# Patient Record
Sex: Female | Born: 1965 | Race: White | Hispanic: No | Marital: Married | Smoking: Former smoker
Health system: Southern US, Community
[De-identification: ages and names within clinical notes are randomized; demographics above are authoritative.]

## PROBLEM LIST (undated history)

## (undated) DIAGNOSIS — I10 Essential (primary) hypertension: Secondary | ICD-10-CM

## (undated) DIAGNOSIS — Z923 Personal history of irradiation: Secondary | ICD-10-CM

## (undated) DIAGNOSIS — Z9221 Personal history of antineoplastic chemotherapy: Secondary | ICD-10-CM

## (undated) DIAGNOSIS — M349 Systemic sclerosis, unspecified: Secondary | ICD-10-CM

## (undated) DIAGNOSIS — C069 Malignant neoplasm of mouth, unspecified: Secondary | ICD-10-CM

## (undated) HISTORY — PX: ABDOMINAL HYSTERECTOMY: SHX81

---

## 2006-03-28 ENCOUNTER — Emergency Department: Payer: Self-pay | Admitting: Emergency Medicine

## 2006-12-11 ENCOUNTER — Ambulatory Visit: Payer: Self-pay | Admitting: Unknown Physician Specialty

## 2008-02-22 ENCOUNTER — Ambulatory Visit: Payer: Self-pay | Admitting: Unknown Physician Specialty

## 2008-02-25 ENCOUNTER — Inpatient Hospital Stay: Payer: Self-pay | Admitting: Unknown Physician Specialty

## 2009-05-25 ENCOUNTER — Emergency Department: Payer: Self-pay | Admitting: Emergency Medicine

## 2009-06-01 ENCOUNTER — Ambulatory Visit: Payer: Self-pay | Admitting: Unknown Physician Specialty

## 2010-02-13 ENCOUNTER — Ambulatory Visit: Payer: Self-pay | Admitting: Unknown Physician Specialty

## 2010-06-05 ENCOUNTER — Ambulatory Visit: Payer: Self-pay | Admitting: Unknown Physician Specialty

## 2010-06-21 ENCOUNTER — Ambulatory Visit: Payer: Self-pay | Admitting: Unknown Physician Specialty

## 2011-06-10 ENCOUNTER — Ambulatory Visit: Payer: Self-pay | Admitting: Unknown Physician Specialty

## 2015-08-03 ENCOUNTER — Emergency Department
Admission: EM | Admit: 2015-08-03 | Discharge: 2015-08-03 | Disposition: A | Discharge status: Home / Self Care | Payer: BLUE CROSS/BLUE SHIELD | Attending: Emergency Medicine | Admitting: Emergency Medicine

## 2015-08-03 ENCOUNTER — Encounter: Payer: Self-pay | Admitting: Emergency Medicine

## 2015-08-03 ENCOUNTER — Emergency Department: Payer: BLUE CROSS/BLUE SHIELD

## 2015-08-03 DIAGNOSIS — Y9389 Activity, other specified: Secondary | ICD-10-CM | POA: Diagnosis not present

## 2015-08-03 DIAGNOSIS — S299XXA Unspecified injury of thorax, initial encounter: Secondary | ICD-10-CM | POA: Diagnosis present

## 2015-08-03 DIAGNOSIS — Y998 Other external cause status: Secondary | ICD-10-CM | POA: Insufficient documentation

## 2015-08-03 DIAGNOSIS — W19XXXA Unspecified fall, initial encounter: Secondary | ICD-10-CM

## 2015-08-03 DIAGNOSIS — Y9289 Other specified places as the place of occurrence of the external cause: Secondary | ICD-10-CM | POA: Insufficient documentation

## 2015-08-03 DIAGNOSIS — Z87891 Personal history of nicotine dependence: Secondary | ICD-10-CM | POA: Diagnosis not present

## 2015-08-03 DIAGNOSIS — S0083XA Contusion of other part of head, initial encounter: Secondary | ICD-10-CM | POA: Diagnosis not present

## 2015-08-03 DIAGNOSIS — W108XXA Fall (on) (from) other stairs and steps, initial encounter: Secondary | ICD-10-CM | POA: Insufficient documentation

## 2015-08-03 DIAGNOSIS — S80811A Abrasion, right lower leg, initial encounter: Secondary | ICD-10-CM | POA: Insufficient documentation

## 2015-08-03 DIAGNOSIS — S80812A Abrasion, left lower leg, initial encounter: Secondary | ICD-10-CM | POA: Insufficient documentation

## 2015-08-03 DIAGNOSIS — Z79899 Other long term (current) drug therapy: Secondary | ICD-10-CM | POA: Insufficient documentation

## 2015-08-03 DIAGNOSIS — R0789 Other chest pain: Secondary | ICD-10-CM

## 2015-08-03 DIAGNOSIS — S29001A Unspecified injury of muscle and tendon of front wall of thorax, initial encounter: Secondary | ICD-10-CM | POA: Insufficient documentation

## 2015-08-03 HISTORY — DX: Malignant neoplasm of mouth, unspecified: C06.9

## 2015-08-03 LAB — CBC WITH DIFFERENTIAL/PLATELET
BASOS ABS: 0.1 10*3/uL (ref 0–0.1)
BASOS PCT: 1 %
EOS ABS: 0.2 10*3/uL (ref 0–0.7)
Eosinophils Relative: 3 %
HEMATOCRIT: 35.3 % (ref 35.0–47.0)
HEMOGLOBIN: 11.4 g/dL — AB (ref 12.0–16.0)
Lymphocytes Relative: 25 %
Lymphs Abs: 1.6 10*3/uL (ref 1.0–3.6)
MCH: 31.5 pg (ref 26.0–34.0)
MCHC: 32.3 g/dL (ref 32.0–36.0)
MCV: 97.6 fL (ref 80.0–100.0)
MONOS PCT: 10 %
Monocytes Absolute: 0.6 10*3/uL (ref 0.2–0.9)
NEUTROS ABS: 3.9 10*3/uL (ref 1.4–6.5)
NEUTROS PCT: 61 %
Platelets: 178 10*3/uL (ref 150–440)
RBC: 3.61 MIL/uL — ABNORMAL LOW (ref 3.80–5.20)
RDW: 14.5 % (ref 11.5–14.5)
WBC: 6.4 10*3/uL (ref 3.6–11.0)

## 2015-08-03 LAB — COMPREHENSIVE METABOLIC PANEL
ALK PHOS: 85 U/L (ref 38–126)
ALT: 27 U/L (ref 14–54)
ANION GAP: 3 — AB (ref 5–15)
AST: 40 U/L (ref 15–41)
Albumin: 3.6 g/dL (ref 3.5–5.0)
BILIRUBIN TOTAL: 0.5 mg/dL (ref 0.3–1.2)
BUN: 12 mg/dL (ref 6–20)
CALCIUM: 8.8 mg/dL — AB (ref 8.9–10.3)
CO2: 25 mmol/L (ref 22–32)
CREATININE: 0.53 mg/dL (ref 0.44–1.00)
Chloride: 105 mmol/L (ref 101–111)
Glucose, Bld: 109 mg/dL — ABNORMAL HIGH (ref 65–99)
Potassium: 3.6 mmol/L (ref 3.5–5.1)
SODIUM: 133 mmol/L — AB (ref 135–145)
TOTAL PROTEIN: 7.1 g/dL (ref 6.5–8.1)

## 2015-08-03 LAB — TROPONIN I

## 2015-08-03 MED ORDER — IOHEXOL 350 MG/ML SOLN
100.0000 mL | Freq: Once | INTRAVENOUS | Status: AC | PRN
  Administered 2015-08-03: 100 mL via INTRAVENOUS

## 2015-08-03 MED ORDER — MORPHINE SULFATE (PF) 4 MG/ML IV SOLN
4.0000 mg | Freq: Once | INTRAVENOUS | Status: AC
  Administered 2015-08-03: 4 mg via INTRAVENOUS
  Filled 2015-08-03: qty 1

## 2015-08-03 MED ORDER — ONDANSETRON HCL 4 MG/2ML IJ SOLN
4.0000 mg | Freq: Once | INTRAMUSCULAR | Status: AC
  Administered 2015-08-03: 4 mg via INTRAVENOUS
  Filled 2015-08-03: qty 2

## 2015-08-03 NOTE — ED Provider Notes (Signed)
[facility] Emergency Department Provider Note  ____________________________________________  Time seen: Approximately 6:16 PM  I have reviewed the triage vital signs and the nursing notes.   HISTORY  Chief Complaint Fall    HPI Whitney Mclean is a 49 y.o. female patient reports she fell down the steps on Tuesday. Patient thinks she may have passed out but isn't sure she had a large bruise on her head. Patient reports she was okay but then 2 days ago began having a lot of pain with movement or deep breathing in the center of her chest over the breastbone. She came in to get that checked. The pain is sharp and stabbing in nature severe nothing else associated with it. Patient does not have a fever cough or chills.   Past Medical History  Diagnosis Date  . Oral cancer ([place])     There are no active problems to display for this patient.   Past Surgical History  Procedure Laterality Date  . Abdominal hysterectomy      Current Outpatient Rx  Name  Route  Sig  Dispense  Refill  . acetaminophen (TYLENOL) 500 MG tablet   Oral   Take 500 mg by mouth every 6 (six) hours as needed.         Marland Kitchen CALCIUM PO   Oral   Take 1 tablet by mouth daily.         . Multiple Vitamins-Minerals (MULTIVITAMIN WOMEN PO)   Oral   Take 1 tablet by mouth daily.           Allergies Oxycodone  No family history on file.  Social History Social History  Substance Use Topics  . Smoking status: Former Research scientist (life sciences)  . Smokeless tobacco: None  . Alcohol Use: Yes    Review of Systems Constitutional: No fever/chills Eyes: No visual changes. ENT: No sore throat. Cardiovascular:chest pain. Respiratory: Denies shortness of breath. Gastrointestinal: No abdominal pain.  No nausea, no vomiting.  No diarrhea.  No constipation. Genitourinary: Negative for dysuria. Musculoskeletal: Negative for back pain. Skin: Negative for rash. Neurological: Negative for headaches, focal  weakness or numbness.  10-point ROS otherwise negative.  ____________________________________________   PHYSICAL EXAM:  VITAL SIGNS: ED Triage Vitals  Enc Vitals Group     BP 08/03/15 1544 148/95 mmHg     Pulse Rate 08/03/15 1544 102     Resp 08/03/15 1544 18     Temp 08/03/15 1544 98.2 F (36.8 C)     Temp Source 08/03/15 1544 Oral     SpO2 08/03/15 1544 98 %     Weight 08/03/15 1544 100 lb (45.36 kg)     Height 08/03/15 1544 5\' 6"  (1.676 m)     Head Cir --      Peak Flow --      Pain Score 08/03/15 1545 9     Pain Loc --      Pain Edu? --      Excl. in [place]? --    Constitutional: Alert and oriented. Well appearing and in no acute distress. Eyes: Conjunctivae are normal. PERRL. EOMI. Head: Atraumatic. Nose: No congestion/rhinnorhea. Mouth/Throat: Mucous membranes are moist.  Oropharynx non-erythematous. Neck: No stridor No cervical spine tenderness to palpation. Cardiovascular: Normal rate, regular rhythm. Grossly normal heart sounds.  Good peripheral circulation. Respiratory: Normal respiratory effort.  No retractions. Lungs CTAB. Gastrointestinal: Soft and nontender. No distention. No abdominal bruits. No CVA tenderness. Musculoskeletal: No lower extremity tenderness nor edema.  No joint effusions.  Neurologic:  Normal speech and language. No gross focal neurologic deficits are appreciated. No gait instability. Skin:  Skin is warm, dry and intact. No rash noted. Patient does have a number of abrasions scratches on the legs anteriorly from the fall. Psychiatric: Mood and affect are normal. Speech and behavior are normal.  ____________________________________________   LABS (all labs ordered are listed, but only abnormal results are displayed)  Labs Reviewed  COMPREHENSIVE METABOLIC PANEL - Abnormal; Notable for the following:    Sodium 133 (*)    Glucose, Bld 109 (*)    Calcium 8.8 (*)    Anion gap 3 (*)    All other components within normal limits  CBC WITH  DIFFERENTIAL/PLATELET - Abnormal; Notable for the following:    RBC 3.61 (*)    Hemoglobin 11.4 (*)    All other components within normal limits  TROPONIN I   ____________________________________________  EKG  EKG read and interpreted by me shows normal sinus rhythm at a rate of 81 normal axis no ST-T wave changes essentially normal EKG ____________________________________________  RADIOLOGY  CT of the head and chest read by radiology as no acute disease I reviewed the CT of the chest and agree ____________________________________________   PROCEDURES   ____________________________________________   INITIAL IMPRESSION / ASSESSMENT AND PLAN / ED COURSE  Pertinent labs & imaging results that were available during my care of the patient were reviewed by me and considered in my medical decision making (see chart for details).   ____________________________________________   FINAL CLINICAL IMPRESSION(S) / ED DIAGNOSES  Final diagnoses:  None      Nena Polio, MD 08/03/15 1859

## 2015-08-03 NOTE — Discharge Instructions (Signed)
Chest Wall Pain Chest wall pain is pain in or around the bones and muscles of your chest. Sometimes, an injury causes this pain. Sometimes, the cause may not be known. This pain may take several weeks or longer to get better. HOME CARE INSTRUCTIONS  Pay attention to any changes in your symptoms. Take these actions to help with your pain:   Rest as told by your health care provider.   Avoid activities that cause pain. These include any activities that use your chest muscles or your abdominal and side muscles to lift heavy items.   If directed, apply ice to the painful area:  Put ice in a plastic bag.  Place a towel between your skin and the bag.  Leave the ice on for 20 minutes, 2-3 times per day.  Take over-the-counter and prescription medicines only as told by your health care provider.  Do not use tobacco products, including cigarettes, chewing tobacco, and e-cigarettes. If you need help quitting, ask your health care provider.  Keep all follow-up visits as told by your health care provider. This is important. SEEK MEDICAL CARE IF:  You have a fever.  Your chest pain becomes worse.  You have new symptoms. SEEK IMMEDIATE MEDICAL CARE IF:  You have nausea or vomiting.  You feel sweaty or light-headed.  You have a cough with phlegm (sputum) or you cough up blood.  You develop shortness of breath.   This information is not intended to replace advice given to you by your health care provider. Make sure you discuss any questions you have with your health care provider.   Document Released: 09/09/2005 Document Revised: 05/31/2015 Document Reviewed: 12/05/2014 Elsevier Interactive Patient Education 2016 [employer].  Use the Vicodin one pill up to 4 times a day as needed for pain. After that you can use Motrin up to 4 of the over-the-counter pills 3 times a day for 3 or 4 days. Please follow-up with your doctor if you continue to have pain after that. Please return to the  emergency room if anything gets worse he developed a fever shortness of breath or much worse pain.

## 2015-08-03 NOTE — ED Notes (Signed)
Pt to ED from [facility], states she fell down some steps on Saturday and hit her head, states she thinks she had some LOC but unsure, states the last couple of days she has become sore all over and then Tuesday started having some soreness across chest, states she is unable to move due to the pain across chest, states she his unsure is she hit her chest when she was falling, pt has bruising noted above right eye

## 2015-08-03 NOTE — ED Notes (Signed)
Patient transported to CT 

## 2016-07-01 ENCOUNTER — Encounter: Payer: Self-pay | Admitting: *Deleted

## 2016-07-01 ENCOUNTER — Emergency Department
Admission: EM | Admit: 2016-07-01 | Discharge: 2016-07-01 | Disposition: A | Discharge status: Home / Self Care | Payer: BLUE CROSS/BLUE SHIELD | Attending: Emergency Medicine | Admitting: Emergency Medicine

## 2016-07-01 ENCOUNTER — Emergency Department: Payer: BLUE CROSS/BLUE SHIELD

## 2016-07-01 DIAGNOSIS — R0781 Pleurodynia: Secondary | ICD-10-CM | POA: Insufficient documentation

## 2016-07-01 DIAGNOSIS — Y939 Activity, unspecified: Secondary | ICD-10-CM | POA: Insufficient documentation

## 2016-07-01 DIAGNOSIS — N3000 Acute cystitis without hematuria: Secondary | ICD-10-CM | POA: Diagnosis not present

## 2016-07-01 DIAGNOSIS — R112 Nausea with vomiting, unspecified: Secondary | ICD-10-CM

## 2016-07-01 DIAGNOSIS — Y92002 Bathroom of unspecified non-institutional (private) residence single-family (private) house as the place of occurrence of the external cause: Secondary | ICD-10-CM | POA: Diagnosis not present

## 2016-07-01 DIAGNOSIS — Z85819 Personal history of malignant neoplasm of unspecified site of lip, oral cavity, and pharynx: Secondary | ICD-10-CM | POA: Insufficient documentation

## 2016-07-01 DIAGNOSIS — Y999 Unspecified external cause status: Secondary | ICD-10-CM | POA: Insufficient documentation

## 2016-07-01 DIAGNOSIS — Z87891 Personal history of nicotine dependence: Secondary | ICD-10-CM | POA: Diagnosis not present

## 2016-07-01 DIAGNOSIS — W1809XA Striking against other object with subsequent fall, initial encounter: Secondary | ICD-10-CM | POA: Insufficient documentation

## 2016-07-01 DIAGNOSIS — Z5181 Encounter for therapeutic drug level monitoring: Secondary | ICD-10-CM | POA: Insufficient documentation

## 2016-07-01 DIAGNOSIS — S0990XA Unspecified injury of head, initial encounter: Secondary | ICD-10-CM | POA: Insufficient documentation

## 2016-07-01 LAB — URINALYSIS COMPLETE WITH MICROSCOPIC (ARMC ONLY)
Bacteria, UA: NONE SEEN
Bilirubin Urine: NEGATIVE
Glucose, UA: NEGATIVE mg/dL
Hgb urine dipstick: NEGATIVE
Nitrite: NEGATIVE
Protein, ur: 100 mg/dL — AB
Specific Gravity, Urine: 1.023 (ref 1.005–1.030)
pH: 5 (ref 5.0–8.0)

## 2016-07-01 LAB — CBC WITH DIFFERENTIAL/PLATELET
Basophils Absolute: 0.1 10*3/uL (ref 0–0.1)
Basophils Relative: 1 %
Eosinophils Absolute: 0 10*3/uL (ref 0–0.7)
Eosinophils Relative: 0 %
HCT: 35.6 % (ref 35.0–47.0)
Hemoglobin: 12.3 g/dL (ref 12.0–16.0)
Lymphocytes Relative: 20 %
Lymphs Abs: 1.5 10*3/uL (ref 1.0–3.6)
MCH: 35.5 pg — ABNORMAL HIGH (ref 26.0–34.0)
MCHC: 34.5 g/dL (ref 32.0–36.0)
MCV: 102.8 fL — ABNORMAL HIGH (ref 80.0–100.0)
Monocytes Absolute: 0.7 10*3/uL (ref 0.2–0.9)
Monocytes Relative: 9 %
Neutro Abs: 5.2 10*3/uL (ref 1.4–6.5)
Neutrophils Relative %: 70 %
Platelets: 157 10*3/uL (ref 150–440)
RBC: 3.47 MIL/uL — ABNORMAL LOW (ref 3.80–5.20)
RDW: 12.7 % (ref 11.5–14.5)
WBC: 7.5 10*3/uL (ref 3.6–11.0)

## 2016-07-01 LAB — PROTIME-INR
INR: 0.89
Prothrombin Time: 12 seconds (ref 11.4–15.2)

## 2016-07-01 LAB — COMPREHENSIVE METABOLIC PANEL
ALT: 24 U/L (ref 14–54)
AST: 36 U/L (ref 15–41)
Albumin: 4.8 g/dL (ref 3.5–5.0)
Alkaline Phosphatase: 50 U/L (ref 38–126)
Anion gap: 13 (ref 5–15)
BUN: 13 mg/dL (ref 6–20)
CO2: 24 mmol/L (ref 22–32)
Calcium: 9.6 mg/dL (ref 8.9–10.3)
Chloride: 98 mmol/L — ABNORMAL LOW (ref 101–111)
Creatinine, Ser: 0.57 mg/dL (ref 0.44–1.00)
GFR calc Af Amer: >60 mL/min (ref >60)
GFR calc non Af Amer: >60 mL/min (ref >60)
Glucose, Bld: 108 mg/dL — ABNORMAL HIGH (ref 65–99)
Potassium: 3.8 mmol/L (ref 3.5–5.1)
Sodium: 135 mmol/L (ref 135–145)
Total Bilirubin: 1.2 mg/dL (ref 0.3–1.2)
Total Protein: 8 g/dL (ref 6.5–8.1)

## 2016-07-01 MED ORDER — ONDANSETRON HCL 4 MG PO TABS: 4.0000 mg | ORAL_TABLET | Freq: Every day | ORAL | 1 refills | Status: AC | PRN

## 2016-07-01 MED ORDER — ONDANSETRON HCL 4 MG/2ML IJ SOLN
4.0000 mg | Freq: Once | INTRAMUSCULAR | Status: AC
  Administered 2016-07-01: 4 mg via INTRAVENOUS
  Filled 2016-07-01: qty 2

## 2016-07-01 MED ORDER — SULFAMETHOXAZOLE-TRIMETHOPRIM 800-160 MG PO TABS: 1.0000 | ORAL_TABLET | Freq: Two times a day (BID) | ORAL | 0 refills | Status: DC

## 2016-07-01 MED ORDER — SULFAMETHOXAZOLE-TRIMETHOPRIM 800-160 MG PO TABS
1.0000 | ORAL_TABLET | Freq: Once | ORAL | Status: AC
  Administered 2016-07-01: 1 via ORAL
  Filled 2016-07-01: qty 1

## 2016-07-01 MED ORDER — SODIUM CHLORIDE 0.9 % IV SOLN
Freq: Once | INTRAVENOUS | Status: AC
  Administered 2016-07-01: 21:00:00 via INTRAVENOUS

## 2016-07-01 NOTE — ED Triage Notes (Addendum)
Pt reports falling yesterday in the bathroom and hitting head on floor.  No loc.  Pt reports today she has a headache and vomited several times today.  Pt had been drinking etoh when fall occurred and pt reports she drinks etoh every day.    Pt alert   Speech clear.

## 2016-07-01 NOTE — ED Provider Notes (Signed)
[facility] Emergency Department Provider Note        Time seen: ----------------------------------------- 8:59 PM on 07/01/2016 -----------------------------------------    I have reviewed the triage vital signs and the nursing notes.   HISTORY  Chief Complaint Fall and Head Injury    HPI Whitney Mclean is a 50 y.o. female who presents to ER after she fell yesterday in the bathroom hitting her head on the floor. She is also complaining of left-sided rib pain. Today she has a headache she has vomited several times and has been unable to keep anything down by mouth currently. She reports she was drinking alcohol when the fall occurred and she drinks 2 large glasses of wine daily. She does not smoke anymore, denies fevers, chills, or shortness of breath.   Past Medical History:  Diagnosis Date  . Oral cancer ([place])     There are no active problems to display for this patient.   Past Surgical History:  Procedure Laterality Date  . ABDOMINAL HYSTERECTOMY      Allergies Oxycodone  Social History Social History  Substance Use Topics  . Smoking status: Former Research scientist (life sciences)  . Smokeless tobacco: Never Used  . Alcohol use Yes    Review of Systems Constitutional: Negative for fever. Cardiovascular: Negative for chest pain. Respiratory: Negative for shortness of breath. Gastrointestinal: Negative for abdominal pain, vomiting and diarrhea. Genitourinary: Negative for dysuria. Musculoskeletal: Positive for left-sided rib pain Skin: Negative for rash. Neurological: Positive for headache  10-point ROS otherwise negative.  ____________________________________________   PHYSICAL EXAM:  VITAL SIGNS: ED Triage Vitals  Enc Vitals Group     BP 07/01/16 1908 (!) 146/85     Pulse Rate 07/01/16 1908 93     Resp 07/01/16 1908 16     Temp --      Temp src --      SpO2 07/01/16 1908 100 %     Weight 07/01/16 1909 105 lb (47.6 kg)     Height 07/01/16  1909 5\' 6"  (1.676 m)     Head Circumference --      Peak Flow --      Pain Score 07/01/16 1917 8     Pain Loc --      Pain Edu? --      Excl. in [place]? --    Constitutional: Alert and oriented. Well appearing and in no distress. Eyes: Conjunctivae are normal. PERRL. Normal extraocular movements. ENT   Head: Normocephalic and atraumatic.   Nose: No congestion/rhinnorhea.   Mouth/Throat: Mucous membranes are moist.   Neck: No stridor.Previous right-sided anterior neck surgery Cardiovascular: Normal rate, regular Whitney. No murmurs, rubs, or gallops. Respiratory: Normal respiratory effort without tachypnea nor retractions. Breath sounds are clear and equal bilaterally. No wheezes/rales/rhonchi. Gastrointestinal: Soft and nontender. Normal bowel sounds Musculoskeletal: Nontender with normal range of motion in all extremities. No lower extremity tenderness nor edema. Left-sided inferior mid axillary rib tenderness Neurologic:  Normal speech and language. No gross focal neurologic deficits are appreciated.  Skin:  Skin is warm, dry and intact. No rash noted. Psychiatric: Mood and affect are normal. Speech and behavior are normal.  ____________________________________________  ED COURSE:  Pertinent labs & imaging results that were available during my care of the patient were reviewed by me and considered in my medical decision making (see chart for details). Clinical Course  Patient presents to ER after a fall, also with concerning alcohol intake. I will assess with basic labs, imaging if needed.  Procedures ____________________________________________   LABS (pertinent positives/negatives)  Labs Reviewed  CBC WITH DIFFERENTIAL/PLATELET - Abnormal; Notable for the following:       Result Value   RBC 3.47 (*)    MCV 102.8 (*)    MCH 35.5 (*)    All other components within normal limits  COMPREHENSIVE METABOLIC PANEL - Abnormal; Notable for the following:    Chloride 98 (*)     Glucose, Bld 108 (*)    All other components within normal limits  URINALYSIS COMPLETEWITH MICROSCOPIC ([facility] ONLY) - Abnormal; Notable for the following:    Color, Urine YELLOW (*)    APPearance CLEAR (*)    Ketones, ur 2+ (*)    Protein, ur 100 (*)    Leukocytes, UA 3+ (*)    Squamous Epithelial / LPF 0-5 (*)    All other components within normal limits  PROTIME-INR    RADIOLOGY Images were viewed by me  CT head, rib x-rays IMPRESSION: No acute disease.  No fracture.  IMPRESSION: 1.  No acute intracranial abnormality. 2. Mild cerebral atrophy for age.    ____________________________________________  FINAL ASSESSMENT AND PLAN  Fall, minor head injury, vomiting, Cystitis  Plan: Patient with labs and imaging as dictated above. Unclear if her symptoms are secondary to concussion or from viral etiology. Currently her symptoms are improving, she'll be discharged antiemetics and antibiotics for her UTI. She is stable for outpatient follow-up with her doctor. I have advised that her CBC is likely reflecting chronic alcoholism at this time and for her to discuss this with her primary care doctor.   Earleen Newport, MD   Note: This dictation was prepared with Dragon dictation. Any transcriptional errors that result from this process are unintentional    Earleen Newport, MD 07/01/16 2211

## 2016-07-18 ENCOUNTER — Other Ambulatory Visit: Payer: Self-pay | Admitting: Internal Medicine

## 2016-07-18 DIAGNOSIS — Z1231 Encounter for screening mammogram for malignant neoplasm of breast: Secondary | ICD-10-CM

## 2016-08-21 ENCOUNTER — Ambulatory Visit
Admission: RE | Admit: 2016-08-21 | Discharge: 2016-08-21 | Disposition: A | Discharge status: Home / Self Care | Payer: BLUE CROSS/BLUE SHIELD | Source: Ambulatory Visit | Attending: Internal Medicine | Admitting: Internal Medicine

## 2016-08-21 DIAGNOSIS — Z1231 Encounter for screening mammogram for malignant neoplasm of breast: Secondary | ICD-10-CM

## 2016-08-26 ENCOUNTER — Other Ambulatory Visit: Payer: Self-pay | Admitting: *Deleted

## 2016-08-26 ENCOUNTER — Inpatient Hospital Stay
Admission: RE | Admit: 2016-08-26 | Discharge: 2016-08-26 | Disposition: A | Discharge status: Home / Self Care | Payer: Self-pay | Source: Ambulatory Visit | Attending: *Deleted | Admitting: *Deleted

## 2016-08-26 DIAGNOSIS — Z9289 Personal history of other medical treatment: Secondary | ICD-10-CM

## 2017-07-08 ENCOUNTER — Other Ambulatory Visit: Payer: Self-pay | Admitting: Internal Medicine

## 2017-07-08 DIAGNOSIS — Z1231 Encounter for screening mammogram for malignant neoplasm of breast: Secondary | ICD-10-CM

## 2017-08-25 ENCOUNTER — Ambulatory Visit
Admission: RE | Admit: 2017-08-25 | Discharge: 2017-08-25 | Disposition: A | Discharge status: Home / Self Care | Payer: BLUE CROSS/BLUE SHIELD | Source: Ambulatory Visit | Attending: Internal Medicine | Admitting: Internal Medicine

## 2017-08-25 DIAGNOSIS — Z1231 Encounter for screening mammogram for malignant neoplasm of breast: Secondary | ICD-10-CM | POA: Diagnosis present

## 2017-12-08 ENCOUNTER — Other Ambulatory Visit: Payer: Self-pay

## 2017-12-08 ENCOUNTER — Ambulatory Visit: Payer: BLUE CROSS/BLUE SHIELD | Attending: Rheumatology | Admitting: Occupational Therapy

## 2017-12-08 DIAGNOSIS — R600 Localized edema: Secondary | ICD-10-CM

## 2017-12-08 DIAGNOSIS — M25642 Stiffness of left hand, not elsewhere classified: Secondary | ICD-10-CM

## 2017-12-08 DIAGNOSIS — M25641 Stiffness of right hand, not elsewhere classified: Secondary | ICD-10-CM | POA: Diagnosis not present

## 2017-12-08 DIAGNOSIS — M6281 Muscle weakness (generalized): Secondary | ICD-10-CM | POA: Diagnosis present

## 2017-12-08 NOTE — Patient Instructions (Signed)
Contrast   extention of digits  Tendon glides   no forcing - pain free  Opposition to all digits 3-5 x day  Joint protection

## 2017-12-08 NOTE — Therapy (Signed)
[place] [facility] [address], [place], [address] Phone: [phone]   Fax:  [phone]  Occupational Therapy Evaluation  Patient Details  Name: Whitney Mclean MRN: [id number] Date of Birth: 1966-02-04 Referring Provider: Jefm Bryant   Encounter Date: 12/08/2017  OT End of Session - 12/08/17 2595    Visit Number  1    Number of Visits  6    Date for OT Re-Evaluation  01/05/18    OT Start Time  1250    OT Stop Time  1344    OT Time Calculation (min)  54 min    Activity Tolerance  Patient tolerated treatment well    Behavior During Therapy  [facility] for tasks assessed/performed       Past Medical History:  Diagnosis Date  . Oral cancer [facility])     Past Surgical History:  Procedure Laterality Date  . ABDOMINAL HYSTERECTOMY      There were no vitals filed for this visit.  Subjective Assessment - 12/08/17 1826    Subjective   My hands started earlier last year with pain and stiffness - and now it is more stiffness - I cannot make fist - and my joints still swollen in finger     Patient Stated Goals  I want to be able to make fist to cut food, grip or hold objects, pick up , turn key and doorknob, do buttons , squeeze washcloth     Currently in Pain?  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 - do some house work        Fluidotherapy done - AROM for digits prior to review of HEP   pt was able to touch palm after fluido - did had some pain over 5th and tenderness over A1pulley of L 5th and R 2nd digit  Review HEP :  Contrast   extention of digits  Tendon glides   no forcing  - pain free  Opposition to all digits 3-5 x day  Joint protection                OT Education - 12/08/17 1833    Education provided  Yes    Education Details  findings of eval and HEP     Person(s) Educated  Patient    Methods  Explanation;Demonstration;Tactile cues;Verbal cues;Handout    Comprehension  Verbalized understanding;Returned demonstration       OT Short Term Goals - 12/08/17 1838      OT SHORT TERM GOAL #1   Title  Pt to be independent in HEP to increase AROM in digits to touch palm     Baseline  see flowsheet- L hand worse than R     Time  2    Period  Weeks    Status  New    Target Date  12/22/17        OT Long Term Goals - 12/08/17 1839      OT LONG  TERM GOAL #1   Title  Pt able to make full fist with no symptoms  to hold knife, turn key and doorknob     Baseline  see flowsheet - cannot do act or have hard time doing it     Time  3    Period  Weeks    Status  New    Target Date  12/29/17      OT LONG TERM GOAL #2   Title  Function score on PRWHE improve with more than 5 points     Baseline  score on PRWHW function as eval 9/50     Time  4    Period  Weeks    Status  New    Target Date  01/05/18      OT LONG TERM GOAL #3   Title  Bilateral grip strength increase with more than 10 lbs to turn doorknob, cut food, , squeeze washcloth     Baseline  grip strength R 33, L 35 lbs     Time  4    Period  Weeks    Status  New    Target Date  01/05/18            Plan - 12/08/17 1834    Clinical Impression Statement  Pt present at OT evaluation with diagnosis of psoriathic arthritis - no pain but severe stiffness- decrease AROM for fisting and extnetion -10 degrees at 4th and 5th digits- decrease grip and prehension strength for her age - increase edema in joints - with some tenderness at 4th an 5th PIP's - pt finished prednisone but report it did not make difference - still taking Humira -tender over A1pulley off R 2nd digit and L 5th ;   ROM  and edema impairing her functional use of hands in ADL's and IADl's     Occupational performance deficits (Please refer to evaluation for details):  ADL's;IADL's;Play;Leisure    Rehab Potential  Good    OT Frequency  -- 1-2 x wk    OT Duration  4 weeks    OT Treatment/Interventions  Self-care/ADL training;Therapeutic exercise;Patient/family education;Paraffin;Fluidtherapy;Ultrasound;Contrast Bath;Manual Therapy;Passive range of motion    Plan  Assess progress with HEP     Clinical Decision Making  Several treatment options, min-mod task modification necessary    OT Home Exercise Plan  see pt instruction    Consulted and Agree with Plan of Care  Patient       Patient will benefit from skilled therapeutic intervention in order to improve the following deficits and impairments:  Increased edema, Impaired flexibility, Decreased strength, Impaired UE functional use, Decreased range of motion  Visit Diagnosis: Stiffness of right hand, not elsewhere classified - Plan: Ot plan of care cert/re-cert  Stiffness of left hand, not elsewhere classified - Plan: Ot plan of care cert/re-cert  Localized edema - Plan: Ot plan of care cert/re-cert  Muscle weakness (generalized) - Plan: Ot plan of care cert/re-cert    Problem List There are no active problems to display for this patient.   Rosalyn Gess OTR/L,CLT 12/08/2017, 6:54 PM  [place] [facility] [address] [place], [address] Phone: [phone]   Fax:  [phone]  Name: RUEY STORER MRN: [id number] Date of Birth: 1966-07-30

## 2017-12-11 ENCOUNTER — Ambulatory Visit: Payer: BLUE CROSS/BLUE SHIELD | Admitting: Occupational Therapy

## 2017-12-11 DIAGNOSIS — R600 Localized edema: Secondary | ICD-10-CM

## 2017-12-11 DIAGNOSIS — M25641 Stiffness of right hand, not elsewhere classified: Secondary | ICD-10-CM

## 2017-12-11 DIAGNOSIS — M6281 Muscle weakness (generalized): Secondary | ICD-10-CM

## 2017-12-11 DIAGNOSIS — M25642 Stiffness of left hand, not elsewhere classified: Secondary | ICD-10-CM

## 2017-12-11 NOTE — Therapy (Signed)
[place] [facility] [address] [place], [address] Phone: [phone]   Fax:  [phone]  Occupational Therapy Treatment  Patient Details  Name: Whitney Mclean MRN: [id number] Date of Birth: [birth year] Referring Provider: Jefm Bryant   Encounter Date: 12/11/2017  OT End of Session - 12/11/17 1008    Visit Number  2    Number of Visits  6    Date for OT Re-Evaluation  01/05/18    OT Start Time  0916    OT Stop Time  0955    OT Time Calculation (min)  39 min    Activity Tolerance  Patient tolerated treatment well    Behavior During Therapy  [facility] for tasks assessed/performed       Past Medical History:  Diagnosis Date  . Oral cancer [facility])     Past Surgical History:  Procedure Laterality Date  . ABDOMINAL HYSTERECTOMY      There were no vitals filed for this visit.  Subjective Assessment - 12/11/17 1005    Subjective   Doing better I think -  my fist little better - still tight and do not feel like the medicine is working - L hand worse than my R - is it because I am R handed and use it more     Patient Stated Goals  I want to be able to make fist to cut food, grip or hold objects, pick up , turn key and doorknob, do buttons , squeeze washcloth     Currently in Pain?  No/denies         [facility] OT Assessment - 12/11/17 0001      Right Hand AROM   R Index  MCP 0-90  80 Degrees    R Index PIP 0-100  75 Degrees    R Long  MCP 0-90  85 Degrees    R Long PIP 0-100  88 Degrees    R Ring  MCP 0-90  90 Degrees    R Ring PIP 0-100  90 Degrees    R Little  MCP 0-90  90 Degrees    R Little PIP 0-100  90 Degrees      Left Hand AROM   L Index  MCP 0-90  75 Degrees    L Index PIP 0-100  90 Degrees    L Long  MCP 0-90  75 Degrees    L Long PIP 0-100  95 Degrees    L Ring  MCP 0-90  60 Degrees    L Ring PIP 0-100  100 Degrees    L Little  MCP 0-90  50 Degrees    L Little PIP 0-100  90 Degrees       AROM  assess in all digits - see flowsheet - R hand more improvement and  MC's more than PIP's          OT Treatments/Exercises (OP) - 12/11/17 0001      RUE Fluidotherapy   Number Minutes Fluidotherapy  8 Minutes    RUE Fluidotherapy Location  Hand;Wrist    Comments  AROM for digits and wrist in all planes       LUE Fluidotherapy   Number Minutes Fluidotherapy  8 Minutes    LUE Fluidotherapy Location  Hand;Wrist    Comments  SOC AROM to all digits         soft tissue massage done to bilateral hands   husband can help at home  MC spreads done ,  And in thumb webspace  Joint mobs to [facility]  And to lateral bands of PIP's  And  Interlock digits  - tight at [facility] - did loosen up during session  10 - 12 reps   AAROM for MC flexion  AROM blocked intrinsic fist  And composite flexion to palm  AAROM done to L 5th but pain to be less than 1-2/10          OT Education - 12/11/17 1008    Education provided  Yes    Education Details  soft tissue mobs add to [employer]) Educated  Patient    Methods  Explanation;Demonstration;Tactile cues;Handout    Comprehension  Verbalized understanding;Returned demonstration       OT Short Term Goals - 12/08/17 1838      OT SHORT TERM GOAL #1   Title  Pt to be independent in HEP to increase AROM in digits to touch palm     Baseline  see flowsheet- L hand worse than R     Time  2    Period  Weeks    Status  New    Target Date  12/22/17        OT Long Term Goals - 12/08/17 1839      OT LONG TERM GOAL #1   Title  Pt able to make full fist with no symptoms  to hold knife, turn key and doorknob     Baseline  see flowsheet - cannot do act or have hard time doing it     Time  3    Period  Weeks    Status  New    Target Date  12/29/17      OT LONG TERM GOAL #2   Title  Function score on PRWHE improve with more than 5 points     Baseline  score on PRWHW function as eval 9/50     Time  4    Period  Weeks    Status  New     Target Date  01/05/18      OT LONG TERM GOAL #3   Title  Bilateral grip strength increase with more than 10 lbs to turn doorknob, cut food, , squeeze washcloth     Baseline  grip strength R 33, L 35 lbs     Time  4    Period  Weeks    Status  New    Target Date  01/05/18            Plan - 12/11/17 1009    Clinical Impression Statement  Pt made great progress in AROM in bilateral digits since only 3 days ago doing HEP provided at evaluation - no pain - only with PROM and composite flexion of L 5th digit - soft tissue done this date and added to HEP - pt tight in MC's and interlocking of fingers not able to do at [facility]-     Occupational performance deficits (Please refer to evaluation for details):  ADL's;IADL's;Play;Leisure    Rehab Potential  Good    OT Frequency  -- 1-2 x wk     OT Duration  4 weeks    OT Treatment/Interventions  Self-care/ADL training;Therapeutic exercise;Patient/family education;Paraffin;Fluidtherapy;Ultrasound;Contrast Bath;Manual Therapy;Passive range of motion    Plan  Assess progress with HEP and soft tissue - cont increase composite flexion     Clinical Decision Making  Several treatment options, min-mod task modification necessary  OT Home Exercise Plan  see pt instruction    Consulted and Agree with Plan of Care  Patient       Patient will benefit from skilled therapeutic intervention in order to improve the following deficits and impairments:  Increased edema, Impaired flexibility, Decreased strength, Impaired UE functional use, Decreased range of motion  Visit Diagnosis: Stiffness of right hand, not elsewhere classified  Stiffness of left hand, not elsewhere classified  Localized edema  Muscle weakness (generalized)    Problem List There are no active problems to display for this patient.   Rosalyn Gess OTR/L,CLT 12/11/2017, 10:11 AM  [place] [facility] [address], [place], [address] Phone: [phone]   Fax:  [phone]  Name: Whitney Mclean MRN: [id number] Date of Birth: 10/21/1965

## 2017-12-11 NOTE — Patient Instructions (Signed)
Cont with same HEP  But add some soft tissue massae after contrast   husband can help  MC spreads, thumb webspace  Joint mobs to MC's  And  Interlock digits  10 - 12 reps

## 2017-12-15 ENCOUNTER — Ambulatory Visit: Payer: BLUE CROSS/BLUE SHIELD | Admitting: Occupational Therapy

## 2017-12-15 DIAGNOSIS — M6281 Muscle weakness (generalized): Secondary | ICD-10-CM

## 2017-12-15 DIAGNOSIS — M25641 Stiffness of right hand, not elsewhere classified: Secondary | ICD-10-CM

## 2017-12-15 DIAGNOSIS — M25642 Stiffness of left hand, not elsewhere classified: Secondary | ICD-10-CM

## 2017-12-15 DIAGNOSIS — R600 Localized edema: Secondary | ICD-10-CM

## 2017-12-15 NOTE — Patient Instructions (Signed)
Same HEP  

## 2017-12-15 NOTE — Therapy (Signed)
[place] [facility] [address] [place], [address] Phone: [phone]   Fax:  [phone]  Occupational Therapy Treatment  Patient Details  Name: Whitney Mclean MRN: [id number] Date of Birth: 1965/12/19 Referring Provider: Jefm Bryant   Encounter Date: 12/15/2017  OT End of Session - 12/15/17 0959    Visit Number  3    Number of Visits  6    Date for OT Re-Evaluation  01/05/18    OT Start Time  0916    OT Stop Time  0958    OT Time Calculation (min)  42 min    Activity Tolerance  Patient tolerated treatment well    Behavior During Therapy  [facility] for tasks assessed/performed       Past Medical History:  Diagnosis Date  . Oral cancer [facility])     Past Surgical History:  Procedure Laterality Date  . ABDOMINAL HYSTERECTOMY      There were no vitals filed for this visit.  Subjective Assessment - 12/15/17 0926    Subjective   I can tell I can grip things better - not dropping things as much - still tight trying to make fist - L hand worse than R - my husband did help little with the massgae     Patient Stated Goals  I want to be able to make fist to cut food, grip or hold objects, pick up , turn key and doorknob, do buttons , squeeze washcloth     Currently in Pain?  No/denies         [facility] OT Assessment - 12/15/17 0001      Right Hand AROM   R Index  MCP 0-90  90 Degrees    R Index PIP 0-100  90 Degrees    R Long  MCP 0-90  90 Degrees    R Long PIP 0-100  95 Degrees    R Ring  MCP 0-90  92 Degrees    R Ring PIP 0-100  100 Degrees    R Little  MCP 0-90  92 Degrees    R Little PIP 0-100  96 Degrees      Left Hand AROM   L Index  MCP 0-90  80 Degrees    L Index PIP 0-100  90 Degrees    L Long  MCP 0-90  80 Degrees    L Long PIP 0-100  100 Degrees    L Ring  MCP 0-90  75 Degrees    L Ring PIP 0-100  100 Degrees    L Little  MCP 0-90  65 Degrees    L Little PIP 0-100  90 Degrees                OT Treatments/Exercises (OP) - 12/15/17 0001      RUE Fluidotherapy   Number Minutes Fluidotherapy  10 Minutes    RUE Fluidotherapy Location  Hand;Wrist    Comments  AROM for diigts - SOC       LUE Fluidotherapy   Number Minutes Fluidotherapy  10 Minutes    LUE Fluidotherapy Location  Hand;Wrist    Comments  OC -      soft tissue massage done to bilateral hands   husband can help at home - did some after last time   [facility] spreads done ,  And in thumb webspace  Joint mobs to MC's  And to lateral bands of PIP's  And  Interlock digits  -  tight at [facility] - did loosen up during session  10 - 12 reps  Progress since last time  Graston tool nr 2 done brushing over volar digits and palm - prior to ROM   AAROM for MC flexion  AROM blocked intrinsic fist  And composite flexion to palm  AAROM done to L 5th but pain to be less than 1-2/10  Place and hold for AROM full fist          OT Education - 12/15/17 0958    Education provided  Yes    Education Details  cont wth soft tissue mobs and massage prior to ROM    Person(s) Educated  Patient    Methods  Explanation;Demonstration;Tactile cues    Comprehension  Verbalized understanding;Returned demonstration       OT Short Term Goals - 12/08/17 1838      OT SHORT TERM GOAL #1   Title  Pt to be independent in HEP to increase AROM in digits to touch palm     Baseline  see flowsheet- L hand worse than R     Time  2    Period  Weeks    Status  New    Target Date  12/22/17        OT Long Term Goals - 12/08/17 1839      OT LONG TERM GOAL #1   Title  Pt able to make full fist with no symptoms  to hold knife, turn key and doorknob     Baseline  see flowsheet - cannot do act or have hard time doing it     Time  3    Period  Weeks    Status  New    Target Date  12/29/17      OT LONG TERM GOAL #2   Title  Function score on PRWHE improve with more than 5 points     Baseline  score on PRWHW function as eval  9/50     Time  4    Period  Weeks    Status  New    Target Date  01/05/18      OT LONG TERM GOAL #3   Title  Bilateral grip strength increase with more than 10 lbs to turn doorknob, cut food, , squeeze washcloth     Baseline  grip strength R 33, L 35 lbs     Time  4    Period  Weeks    Status  New    Target Date  01/05/18            Plan - 12/15/17 0959    Clinical Impression Statement  Pt cont to make progress in AROM in bilateral hands - digits flexion and extention - increase ABD of digits compare to last time  - L hand still more tight than R - and tight and swollen at [facility] and PIP jionts - she has nore more Humira shot prior to appt with Dr Jefm Bryant     Occupational performance deficits (Please refer to evaluation for details):  ADL's;IADL's;Play;Leisure    Rehab Potential  Good    OT Frequency  -- 1-2 x wk    OT Duration  4 weeks    OT Treatment/Interventions  Self-care/ADL training;Therapeutic exercise;Patient/family education;Paraffin;Fluidtherapy;Ultrasound;Contrast Bath;Manual Therapy;Passive range of motion    Plan  Assess progress with HEP and soft tissue - cont increase composite flexion ; asess grip strength again     OT Home Exercise Plan  see  pt instruction    Consulted and Agree with Plan of Care  Patient       Patient will benefit from skilled therapeutic intervention in order to improve the following deficits and impairments:  Increased edema, Impaired flexibility, Decreased strength, Impaired UE functional use, Decreased range of motion  Visit Diagnosis: Stiffness of right hand, not elsewhere classified  Stiffness of left hand, not elsewhere classified  Localized edema  Muscle weakness (generalized)    Problem List There are no active problems to display for this patient.   Rosalyn Gess OTR/L,CLT 12/15/2017, 10:10 AM  [place] [facility] [address], [place], [address] Phone:  [phone]   Fax:  [phone]  Name: Whitney Mclean MRN: [id number] Date of Birth: [birth year]

## 2017-12-18 ENCOUNTER — Ambulatory Visit: Payer: BLUE CROSS/BLUE SHIELD | Admitting: Occupational Therapy

## 2017-12-18 DIAGNOSIS — M25642 Stiffness of left hand, not elsewhere classified: Secondary | ICD-10-CM

## 2017-12-18 DIAGNOSIS — M25641 Stiffness of right hand, not elsewhere classified: Secondary | ICD-10-CM

## 2017-12-18 DIAGNOSIS — M6281 Muscle weakness (generalized): Secondary | ICD-10-CM

## 2017-12-18 DIAGNOSIS — R600 Localized edema: Secondary | ICD-10-CM

## 2017-12-18 NOTE — Therapy (Signed)
[place] [facility] [address] [place], [address] Phone: [phone]   Fax:  [phone]  Occupational Therapy Treatment  Patient Details  Name: NYKERRIA MACCONNELL MRN: [id number] Date of Birth: 01/11/1966 Referring Provider: Jefm Bryant   Encounter Date: 12/18/2017  OT End of Session - 12/18/17 1101    Visit Number  4    Number of Visits  6    Date for OT Re-Evaluation  01/05/18    OT Start Time  1000    OT Stop Time  1037    OT Time Calculation (min)  37 min    Activity Tolerance  Patient tolerated treatment well    Behavior During Therapy  [facility] for tasks assessed/performed       Past Medical History:  Diagnosis Date  . Oral cancer [facility])     Past Surgical History:  Procedure Laterality Date  . ABDOMINAL HYSTERECTOMY      There were no vitals filed for this visit.  Subjective Assessment - 12/18/17 1058    Subjective   Doing okay - my husband is really helping me with the massages to my hands - still really tight  L worse than the R - but able to grip better     Patient Stated Goals  I want to be able to make fist to cut food, grip or hold objects, pick up , turn key and doorknob, do buttons , squeeze washcloth     Currently in Pain?  No/denies         [facility] OT Assessment - 12/18/17 0001      Strength   Right Hand Grip (lbs)  40    Right Hand Lateral Pinch  12 lbs    Right Hand 3 Point Pinch  12 lbs    Left Hand Grip (lbs)  41    Left Hand Lateral Pinch  11 lbs    Left Hand 3 Point Pinch  12 lbs       Grip strength assess in bilateral hands - see flowsheet          OT Treatments/Exercises (OP) - 12/18/17 0001      RUE Paraffin   Number Minutes Paraffin  10 Minutes    RUE Paraffin Location  Hand    Comments  prior to manual to decrease stfiffness       LUE Paraffin   Number Minutes Paraffin  10 Minutes    LUE Paraffin Location  Hand    Comments  prior to manual -decrease stiffness        soft tissue massagedone to bilateral hands husband  helps at home  [facility] spreadsdone,And inthumb webspace  Joint mobs to [facility] And to lateral bands of PIP'sand gentle traction And Interlock digits -  Loosing up  during session and compare to last week 10 - 12 reps  Graston tool nr 2 done brushing over volar digits and palm - prior to ROM   AAROM for MC flexion  AROM blocked intrinsic fist  And composite flexion to palm   Place and hold for AROM full fist  Done and pt to do flat fist to one finger out of palm on L - to focus on MC flexion  Putty add for rolling of digits - to maintain and increase digits extention        OT Education - 12/18/17 1100    Education provided  Yes    Education Details  HEP add rolling putty  for extention -and flat fist to increase L MC flexion     Person(s) Educated  Patient    Methods  Explanation;Demonstration;Tactile cues;Verbal cues    Comprehension  Verbalized understanding;Returned demonstration       OT Short Term Goals - 12/08/17 1838      OT SHORT TERM GOAL #1   Title  Pt to be independent in HEP to increase AROM in digits to touch palm     Baseline  see flowsheet- L hand worse than R     Time  2    Period  Weeks    Status  New    Target Date  12/22/17        OT Long Term Goals - 12/08/17 1839      OT LONG TERM GOAL #1   Title  Pt able to make full fist with no symptoms  to hold knife, turn key and doorknob     Baseline  see flowsheet - cannot do act or have hard time doing it     Time  3    Period  Weeks    Status  New    Target Date  12/29/17      OT LONG TERM GOAL #2   Title  Function score on PRWHE improve with more than 5 points     Baseline  score on PRWHW function as eval 9/50     Time  4    Period  Weeks    Status  New    Target Date  01/05/18      OT LONG TERM GOAL #3   Title  Bilateral grip strength increase with more than 10 lbs to turn doorknob, cut food, , squeeze washcloth      Baseline  grip strength R 33, L 35 lbs     Time  4    Period  Weeks    Status  New    Target Date  01/05/18            Plan - 12/18/17 1102    Clinical Impression Statement  Pt this date showed increase grip and prehension strength in bilateral hands - cont to make progress in AROM in digits - pt to focus on MC flexion on L - and also maintain digits extention while working on  flexion - no pain per pt mostly tightness    Occupational performance deficits (Please refer to evaluation for details):  ADL's;IADL's;Play;Leisure    Rehab Potential  Good    OT Frequency  -- 1-2 x wk    OT Duration  4 weeks    OT Treatment/Interventions  Self-care/ADL training;Therapeutic exercise;Patient/family education;Paraffin;Fluidtherapy;Ultrasound;Contrast Bath;Manual Therapy;Passive range of motion    Plan  cont increase ROM and strength in bilateral hands    Clinical Decision Making  Several treatment options, min-mod task modification necessary    OT Home Exercise Plan  see pt instruction    Consulted and Agree with Plan of Care  Patient       Patient will benefit from skilled therapeutic intervention in order to improve the following deficits and impairments:  Increased edema, Impaired flexibility, Decreased strength, Impaired UE functional use, Decreased range of motion  Visit Diagnosis: Stiffness of right hand, not elsewhere classified  Stiffness of left hand, not elsewhere classified  Localized edema  Muscle weakness (generalized)    Problem List There are no active problems to display for this patient.   Rosalyn Gess OTR/L,CLT 12/18/2017, 11:18 AM  [place]  [place] [facility] [address], [place], [address] Phone: [phone]   Fax:  [phone]  Name: YINA RIVIERE MRN: [id number] Date of Birth: [birth year]

## 2017-12-18 NOTE — Patient Instructions (Signed)
Putty for rolling for digits extention bilateral hands  And L hand composite fist to one finger out of palm - with flat fist - focus on MC flexion

## 2017-12-22 ENCOUNTER — Ambulatory Visit: Payer: BLUE CROSS/BLUE SHIELD | Attending: Rheumatology | Admitting: Occupational Therapy

## 2017-12-22 DIAGNOSIS — M25642 Stiffness of left hand, not elsewhere classified: Secondary | ICD-10-CM | POA: Diagnosis present

## 2017-12-22 DIAGNOSIS — M25641 Stiffness of right hand, not elsewhere classified: Secondary | ICD-10-CM | POA: Diagnosis present

## 2017-12-22 DIAGNOSIS — M6281 Muscle weakness (generalized): Secondary | ICD-10-CM | POA: Diagnosis present

## 2017-12-22 DIAGNOSIS — R600 Localized edema: Secondary | ICD-10-CM | POA: Insufficient documentation

## 2017-12-22 NOTE — Patient Instructions (Signed)
Same HEP  Cont PROM composite flexion L hand  Prior to tendon glides

## 2017-12-22 NOTE — Therapy (Signed)
[facility] [address], [place], [address] Phone: [phone]   Fax:  [phone]  Occupational Therapy Treatment  Patient Details  Name: JAMEICA COUTS MRN: [id number] Date of Birth: 05/17/1966 Referring Provider: Jefm Bryant   Encounter Date: 12/22/2017  OT End of Session - 12/22/17 1011    Visit Number  5    Number of Visits  6    Date for OT Re-Evaluation  01/05/18    OT Start Time  0915    OT Stop Time  0948    OT Time Calculation (min)  33 min    Activity Tolerance  Patient tolerated treatment well    Behavior During Therapy  [facility] for tasks assessed/performed       Past Medical History:  Diagnosis Date  . Oral cancer [facility])     Past Surgical History:  Procedure Laterality Date  . ABDOMINAL HYSTERECTOMY      There were no vitals filed for this visit.  Subjective Assessment - 12/22/17 1007    Subjective   Doing okay - my hands feel actually good the am - taking my shot again tomorow - and follow up with Dr Jefm Bryant next week     Patient Stated Goals  I want to be able to make fist to cut food, grip or hold objects, pick up , turn key and doorknob, do buttons , squeeze washcloth     Currently in Pain?  No/denies         [facility] OT Assessment - 12/22/17 0001      Left Hand AROM   L Index  MCP 0-90  85 Degrees    L Index PIP 0-100  95 Degrees    L Long  MCP 0-90  85 Degrees    L Long PIP 0-100  100 Degrees    L Ring  MCP 0-90  75 Degrees    L Ring PIP 0-100  100 Degrees    L Little  MCP 0-90  65 Degrees    L Little PIP 0-100  95 Degrees       Measurements taken - bilateral hands           OT Treatments/Exercises (OP) - 12/22/17 0001      RUE Paraffin   Number Minutes Paraffin  10 Minutes    RUE Paraffin Location  Hand    Comments  AT SOC to increase ROM - pirio to manual       LUE Paraffin   Number Minutes Paraffin  10 Minutes    LUE Paraffin Location  Hand    Comments   AT SOC to increase ROM - prior to manual        soft tissue massagedone to bilateral hands husband  helps at home [facility] spreadsdone,And inthumb webspace  Joint mobs to [facility] And to lateral bands of PIP'sand gentle traction And Interlock digits -  Loosing up  during session and compare to last week 10 - 12 reps  Graston tool nr 2 done brushing over volar digits and palm - prior to ROM  AAROM for MC flexion  AROM blocked intrinsic fist  And composite flexion to palm   Place and hold for AROM full fist L more than R  Done and pt to do flat fist to one finger out of palm on L - to focus on MC flexion  And composite flexion PROM  Putty add for rolling of digits - to maintain and  increase digits extention         OT Education - 12/22/17 1011    Education provided  Yes    Education Details  HEP, soft tissue and composite     Person(s) Educated  Patient    Methods  Explanation;Demonstration    Comprehension  Verbalized understanding;Returned demonstration       OT Short Term Goals - 12/22/17 1013      OT SHORT TERM GOAL #1   Title  Pt to be independent in HEP to increase AROM in digits to touch palm     Baseline  increase AROM - L still decrease at [facility] - R touching palm     Time  2    Period  Weeks    Status  On-going    Target Date  12/29/17        OT Long Term Goals - 12/22/17 1014      OT LONG TERM GOAL #1   Title  Pt able to make full fist with no symptoms  to hold knife, turn key and doorknob     Baseline  progressing R hand full fist - L still decrease - but can use better     Time  2    Period  Weeks    Status  On-going    Target Date  12/29/17      OT LONG TERM GOAL #2   Title  Function score on PRWHE improve with more than 5 points     Baseline  score on PRWHW function as eval 9/50  - will assess next session     Time  2    Period  Weeks    Status  On-going    Target Date  01/05/18      OT LONG TERM GOAL #3   Title  Bilateral grip  strength increase with more than 10 lbs to turn doorknob, cut food, , squeeze washcloth     Baseline  grip strength R 33, L 35 lbs  at eval and now 40lbs R , L 41 lbs     Time  2    Period  Weeks    Status  On-going    Target Date  01/05/18            Plan - 12/22/17 1012    Clinical Impression Statement  Pt cont to show progress in AROM in bilateral digits - L hand MC's flexion still decrease and tight - and L 5th digit tight - recommend for pt to look into getting paraffin bath to use at home  to use prior to ROM     Occupational performance deficits (Please refer to evaluation for details):  ADL's;IADL's;Play;Leisure    Rehab Potential  Good    OT Frequency  -- 1-2 x wks    OT Duration  2 weeks    OT Treatment/Interventions  Self-care/ADL training;Therapeutic exercise;Patient/family education;Paraffin;Fluidtherapy;Ultrasound;Contrast Bath;Manual Therapy;Passive range of motion    Plan  cont increase ROM and strength in bilateral hands - and do PRWHE     Clinical Decision Making  Several treatment options, min-mod task modification necessary    OT Home Exercise Plan  see pt instruction    Consulted and Agree with Plan of Care  Patient       Patient will benefit from skilled therapeutic intervention in order to improve the following deficits and impairments:  Increased edema, Impaired flexibility, Decreased strength, Impaired UE functional use, Decreased range of motion  Visit Diagnosis:  Stiffness of right hand, not elsewhere classified  Stiffness of left hand, not elsewhere classified  Localized edema  Muscle weakness (generalized)    Problem List There are no active problems to display for this patient.   Rosalyn Gess OTR/L,CLT 12/22/2017, 10:17 AM  [place] [facility] [address] [place], [address] Phone: [phone]   Fax:  [phone]  Name: EARLISHA SHARPLES MRN: [id number] Date of  Birth: [birth year]

## 2017-12-25 ENCOUNTER — Ambulatory Visit: Payer: BLUE CROSS/BLUE SHIELD | Admitting: Occupational Therapy

## 2017-12-25 DIAGNOSIS — M25641 Stiffness of right hand, not elsewhere classified: Secondary | ICD-10-CM

## 2017-12-25 DIAGNOSIS — R600 Localized edema: Secondary | ICD-10-CM

## 2017-12-25 DIAGNOSIS — M25642 Stiffness of left hand, not elsewhere classified: Secondary | ICD-10-CM

## 2017-12-25 DIAGNOSIS — M6281 Muscle weakness (generalized): Secondary | ICD-10-CM

## 2017-12-25 NOTE — Therapy (Signed)
[place] [facility] [address] [place], [address] Phone: [phone]   Fax:  [phone]  Occupational Therapy Treatment  Patient Details  Name: Whitney Mclean MRN: [id number] Date of Birth: 1966-04-22 Referring Provider: Jefm Bryant   Encounter Date: 12/25/2017  OT End of Session - 12/25/17 1129    Visit Number  6    Number of Visits  7    Date for OT Re-Evaluation  01/05/18    OT Start Time  1020    OT Stop Time  1100    OT Time Calculation (min)  40 min    Activity Tolerance  Patient tolerated treatment well    Behavior During Therapy  [facility] for tasks assessed/performed       Past Medical History:  Diagnosis Date  . Oral cancer [facility])     Past Surgical History:  Procedure Laterality Date  . ABDOMINAL HYSTERECTOMY      There were no vitals filed for this visit.  Subjective Assessment - 12/25/17 1041    Subjective   My L hand is  tigth today - no pain - but I don't think the Humira is working - had my last shot on Tues and seeing Dr Jefm Bryant next week     Patient Stated Goals  I want to be able to make fist to cut food, grip or hold objects, pick up , turn key and doorknob, do buttons , squeeze washcloth     Currently in Pain?  No/denies                   OT Treatments/Exercises (OP) - 12/25/17 0001      RUE Paraffin   Number Minutes Paraffin  10 Minutes    RUE Paraffin Location  Hand    Comments  at [facility] to increase ROM       LUE Paraffin   Number Minutes Paraffin  10 Minutes    LUE Paraffin Location  Hand    Comments  at [facility] to increase ROM         soft tissue massagedone to bilateral hands husband helpsat home MC spreadsdone,And inthumb webspace  Joint mobs to [facility] And to lateral bands of PIP'sand gentle traction And Interlock digits - Loosing upduring session and compare to last week 10 - 12 reps  Graston tool nr 2 done brushing over volar digits and  palm - prior to ROM Smoother over volar plates -   PROM for MC flexion  And composite flexion PROM  Add these 2 to HEP prior to AROM   And composite flexion to palm   Place and hold for AROM full fist   Putty add for rolling of digits - to maintain and increase digits extention       OT Education - 12/25/17 1128    Education provided  Yes    Education Details  HEP add PROM to increase MC flexion to 90     Person(s) Educated  Patient    Methods  Explanation;Demonstration;Tactile cues    Comprehension  Verbalized understanding;Returned demonstration       OT Short Term Goals - 12/22/17 1013      OT SHORT TERM GOAL #1   Title  Pt to be independent in HEP to increase AROM in digits to touch palm     Baseline  increase AROM - L still decrease at [facility] - R touching palm     Time  2  Period  Weeks    Status  On-going    Target Date  12/29/17        OT Long Term Goals - 12/22/17 1014      OT LONG TERM GOAL #1   Title  Pt able to make full fist with no symptoms  to hold knife, turn key and doorknob     Baseline  progressing R hand full fist - L still decrease - but can use better     Time  2    Period  Weeks    Status  On-going    Target Date  12/29/17      OT LONG TERM GOAL #2   Title  Function score on PRWHE improve with more than 5 points     Baseline  score on PRWHW function as eval 9/50  - will assess next session     Time  2    Period  Weeks    Status  On-going    Target Date  01/05/18      OT LONG TERM GOAL #3   Title  Bilateral grip strength increase with more than 10 lbs to turn doorknob, cut food, , squeeze washcloth     Baseline  grip strength R 33, L 35 lbs  at eval and now 40lbs R , L 41 lbs     Time  2    Period  Weeks    Status  On-going    Target Date  01/05/18            Plan - 12/25/17 1129    Clinical Impression Statement  Pt AROM in bilateral hands about the same than last time - but great improvement since [facility] - still tight  in L MC's flexion more than R - - grip same in bilateral hands - pt looking for paraffin bath - joints still tight - pt to see Dr Leona Singleton later next week     Occupational performance deficits (Please refer to evaluation for details):  ADL's;IADL's;Play;Leisure    Rehab Potential  Good    OT Frequency  1x / week    OT Duration  2 weeks    OT Treatment/Interventions  Self-care/ADL training;Therapeutic exercise;Patient/family education;Paraffin;Fluidtherapy;Ultrasound;Contrast Bath;Manual Therapy;Passive range of motion    Plan  cont increase ROM and strength in bilateral hands - and do PRWHE     Clinical Decision Making  Several treatment options, min-mod task modification necessary    OT Home Exercise Plan  see pt instruction    Consulted and Agree with Plan of Care  Patient       Patient will benefit from skilled therapeutic intervention in order to improve the following deficits and impairments:  Increased edema, Impaired flexibility, Decreased strength, Impaired UE functional use, Decreased range of motion  Visit Diagnosis: Stiffness of right hand, not elsewhere classified  Stiffness of left hand, not elsewhere classified  Localized edema  Muscle weakness (generalized)    Problem List There are no active problems to display for this patient.   Rosalyn Gess OTR/L,CLT 12/25/2017, 11:32 AM  [place] [facility] [address] [place], [address] Phone: [phone]   Fax:  [phone]  Name: Whitney Mclean MRN: [id number] Date of Birth: 1966/05/25

## 2017-12-25 NOTE — Patient Instructions (Signed)
Focus on MC flexion  PROM  And composite flexion PROM  Prior to AROM

## 2017-12-29 ENCOUNTER — Ambulatory Visit: Payer: BLUE CROSS/BLUE SHIELD | Admitting: Occupational Therapy

## 2017-12-29 DIAGNOSIS — M6281 Muscle weakness (generalized): Secondary | ICD-10-CM

## 2017-12-29 DIAGNOSIS — M25641 Stiffness of right hand, not elsewhere classified: Secondary | ICD-10-CM | POA: Diagnosis not present

## 2017-12-29 DIAGNOSIS — R600 Localized edema: Secondary | ICD-10-CM

## 2017-12-29 DIAGNOSIS — M25642 Stiffness of left hand, not elsewhere classified: Secondary | ICD-10-CM

## 2017-12-29 NOTE — Therapy (Signed)
[place] [facility] [address] [place], [address] Phone: [phone]   Fax:  [phone]  Occupational Therapy Treatment  Patient Details  Name: Whitney Mclean MRN: [id number] Date of Birth: 1966-05-13 Referring Provider: Jefm Bryant   Encounter Date: 12/29/2017  OT End of Session - 12/29/17 1017    Visit Number  7    Number of Visits  7    Date for OT Re-Evaluation  01/05/18    OT Start Time  0919    OT Stop Time  0949    OT Time Calculation (min)  30 min    Activity Tolerance  Patient tolerated treatment well    Behavior During Therapy  [facility] for tasks assessed/performed       Past Medical History:  Diagnosis Date  . Oral cancer [facility])     Past Surgical History:  Procedure Laterality Date  . ABDOMINAL HYSTERECTOMY      There were no vitals filed for this visit.  Subjective Assessment - 12/29/17 1008    Subjective   Doing okay - but that stretch to my finger I am suppose to do - you need to show me again - we did look at paraffin baths but I want to get one I can use for my feet too - my joints still stiff - and knuckles feel swollen - to see Dr Jefm Bryant  Thursday     Patient Stated Goals  I want to be able to make fist to cut food, grip or hold objects, pick up , turn key and doorknob, do buttons , squeeze washcloth     Currently in Pain?  No/denies         [facility] OT Assessment - 12/29/17 0001      Strength   Right Hand Grip (lbs)  40    Right Hand Lateral Pinch  12 lbs    Right Hand 3 Point Pinch  12 lbs    Left Hand Grip (lbs)  41    Left Hand Lateral Pinch  11 lbs    Left Hand 3 Point Pinch  12 lbs               OT Treatments/Exercises (OP) - 12/29/17 0001      RUE Paraffin   Number Minutes Paraffin  10 Minutes    RUE Paraffin Location  Hand    Comments  At [facility] prior to manual and ROM       LUE Paraffin   Number Minutes Paraffin  10 Minutes    LUE Paraffin Location  Hand    Comments  at [facility] to increase ROM - and prior to manual       soft tissue massagedone to bilateral hands husband helpsat home MC spreadsdone,And inthumb webspace  Joint mobs to MC's And to lateral bands of PIP'sand gentle traction And Interlock digits - Loosing upduring session and compare to last week 10 - 12 reps  Graston tool nr 2 done brushing over volar digits and palm - prior to ROM Smoother over volar plates -   PROM for MC flexion  And composite flexion PROM review again with pt  Cont with these 2 in  HEP prior to AROM   And composite flexion to palm   Place and hold for AROM full fist   Putty add for rolling of digits - to maintain and increase digits extention         OT Education - 12/29/17 1017  Education provided  Yes    Education Details  HEP and plan     Person(s) Educated  Patient    Methods  Explanation;Demonstration;Tactile cues    Comprehension  Returned demonstration       OT Short Term Goals - 12/29/17 1021      OT SHORT TERM GOAL #1   Title  Pt to be independent in HEP to increase AROM in digits to touch palm     Baseline  increase AROM - L still decrease at [facility] - R touching palm     Time  2    Period  Weeks    Status  On-going    Target Date  01/12/18        OT Long Term Goals - 12/29/17 1021      OT LONG TERM GOAL #1   Title  Pt able to make full fist with no symptoms  to hold knife, turn key and doorknob     Baseline  progressing R hand full fist - L still decrease at [facility] - but can do all activities     Time  2    Period  Weeks    Status  On-going    Target Date  01/12/18      OT LONG TERM GOAL #2   Title  Function score on PRWHE improve with more than 5 points     Baseline  score on PRWHW function as eval 9/50  -  and now 4/50     Status  Achieved      OT LONG TERM GOAL #3   Title  Bilateral grip strength increase with more than 10 lbs to turn doorknob, cut food, , squeeze washcloth      Baseline  grip strength R 33, L 35 lbs  at eval and now 40lbs R , L 41 lbs     Time  2    Period  Weeks    Status  On-going    Target Date  01/12/18            Plan - 12/29/17 1018    Clinical Impression Statement  Pt made great progress in bilateral AROM in bilateral hands since 3 wks ago from eval - and increase grip and prehension strength - pt cont to show decrease AROM in L MC's - and tightness in MC's and PIP's with enlarge joints - pt to see Dr Jefm Bryant later this week  - pt to contact me after appt - but plan to follow up with pt again in 2 wks      Occupational performance deficits (Please refer to evaluation for details):  ADL's;IADL's;Play;Leisure    Rehab Potential  Good    OT Frequency  Biweekly    OT Duration  2 weeks    OT Treatment/Interventions  Self-care/ADL training;Therapeutic exercise;Patient/family education;Paraffin;Fluidtherapy;Ultrasound;Contrast Bath;Manual Therapy;Passive range of motion    Plan  Pt to follow up with Dr Jefm Bryant later this week - will follow up in 2 wks with me except if any thing change     Clinical Decision Making  Several treatment options, min-mod task modification necessary    OT Home Exercise Plan  see pt instruction    Consulted and Agree with Plan of Care  Patient       Patient will benefit from skilled therapeutic intervention in order to improve the following deficits and impairments:  Increased edema, Impaired flexibility, Decreased strength, Impaired UE functional use, Decreased range of motion  Visit Diagnosis:  Stiffness of right hand, not elsewhere classified  Stiffness of left hand, not elsewhere classified  Localized edema  Muscle weakness (generalized)    Problem List There are no active problems to display for this patient.   Rosalyn Gess OTR/L,CLT 12/29/2017, 10:24 AM  [place] [facility] [address] [place], [address] Phone: [phone]    Fax:  [phone]  Name: Whitney Mclean MRN: [id number] Date of Birth: March 14, 1966

## 2017-12-29 NOTE — Patient Instructions (Signed)
Pt to cont woth paraffin bath   massage to digits and knuckles  And PROM composite flexion   Tendon glides  AROM  Rolling putty for extention of digits

## 2018-08-24 ENCOUNTER — Other Ambulatory Visit: Payer: Self-pay | Admitting: Internal Medicine

## 2018-08-24 DIAGNOSIS — Z1231 Encounter for screening mammogram for malignant neoplasm of breast: Secondary | ICD-10-CM

## 2018-09-09 ENCOUNTER — Ambulatory Visit
Admission: RE | Admit: 2018-09-09 | Discharge: 2018-09-09 | Disposition: A | Discharge status: Home / Self Care | Payer: BLUE CROSS/BLUE SHIELD | Source: Ambulatory Visit | Attending: Internal Medicine | Admitting: Internal Medicine

## 2018-09-09 DIAGNOSIS — Z1231 Encounter for screening mammogram for malignant neoplasm of breast: Secondary | ICD-10-CM | POA: Diagnosis present

## 2019-06-11 ENCOUNTER — Ambulatory Visit: Payer: BC Managed Care – PPO | Admitting: Physical Therapy

## 2019-06-11 ENCOUNTER — Encounter: Payer: Self-pay | Admitting: Physical Therapy

## 2019-06-11 ENCOUNTER — Other Ambulatory Visit: Payer: Self-pay

## 2019-06-11 ENCOUNTER — Encounter: Payer: Self-pay | Admitting: Occupational Therapy

## 2019-06-11 ENCOUNTER — Ambulatory Visit: Payer: BC Managed Care – PPO | Attending: Internal Medicine | Admitting: Occupational Therapy

## 2019-06-11 DIAGNOSIS — M79641 Pain in right hand: Secondary | ICD-10-CM | POA: Diagnosis present

## 2019-06-11 DIAGNOSIS — M542 Cervicalgia: Secondary | ICD-10-CM

## 2019-06-11 DIAGNOSIS — M79642 Pain in left hand: Secondary | ICD-10-CM | POA: Insufficient documentation

## 2019-06-11 DIAGNOSIS — M25611 Stiffness of right shoulder, not elsewhere classified: Secondary | ICD-10-CM | POA: Diagnosis present

## 2019-06-11 DIAGNOSIS — M25642 Stiffness of left hand, not elsewhere classified: Secondary | ICD-10-CM | POA: Diagnosis present

## 2019-06-11 DIAGNOSIS — M25641 Stiffness of right hand, not elsewhere classified: Secondary | ICD-10-CM | POA: Diagnosis not present

## 2019-06-11 DIAGNOSIS — R600 Localized edema: Secondary | ICD-10-CM | POA: Diagnosis present

## 2019-06-11 DIAGNOSIS — M6281 Muscle weakness (generalized): Secondary | ICD-10-CM | POA: Diagnosis present

## 2019-06-11 NOTE — Therapy (Addendum)
[place] [facility] [address], [place], [address] Phone: [phone]   Fax:  [phone]  Physical Therapy Treatment  Patient Details  Name: Whitney Mclean MRN: [id number] Date of Birth: 05-20-1966 No data recorded  Encounter Date: 06/11/2019  PT End of Session - 06/11/19 0945    Visit Number  1    Number of Visits  17    Date for PT Re-Evaluation  08/05/19    PT Start Time  0830    PT Stop Time  0930    PT Time Calculation (min)  60 min    Activity Tolerance  Patient tolerated treatment well    Behavior During Therapy  [facility] for tasks assessed/performed       Past Medical History:  Diagnosis Date  . Oral cancer [facility])     Past Surgical History:  Procedure Laterality Date  . ABDOMINAL HYSTERECTOMY      There were no vitals filed for this visit.   OBJECTIVE  MUSCULOSKELETAL: Tremor: Normal Bulk: Normal Tone: Normal  Cervical Screen AROM:  R/L 55d/10d Rotation 30d/15d Lateral bending 0d Extension 90d  Flexion    Elbow Screen Elbow flex WNL Elbow Ext R/L 132d/155d  Palpation No pain with palpation to anterior, posterior, lateral, and superior shoulder  Strength R/L 2-/5 Shoulder flexion (anterior deltoid/pec major/coracobrachialis, axillary n. (C5-6) and musculocutaneous n. (C5-7)) 2-/5 Shoulder abduction (deltoid/supraspinatus, axillary/suprascapular n, C5) 3+/5 Shoulder external rotation (infraspinatus/teres minor) 4-/5 Shoulder internal rotation (subcapularis/lats/pec major) 4-/5 Shoulder extension (posterior deltoid, lats, teres major, axillary/thoracodorsal n.) 5/5 Elbow flexion (biceps brachii, brachialis, brachioradialis, musculoskeletal n, C5-6) 4/4 Elbow extension (triceps, radial n, C7) 4-/5 Wrist Extension 5/5 Wrist Flexion Unable to splay fingers Finger adduction (interossei, ulnar n, T1)  AROM R/L 30d/125d Shoulder flexion 37d/155 Shoulder abduction R  suboccipital/C7 Shoulder external rotation L1/T10 shoulder internal rotation 55d/55d Shoulder extension *Indicates pain, overpressure performed unless otherwise indicated  PROM R/L 62/140 Shoulder flexion 85/130 Shoulder abduction 55/90 Shoulder external rotation 49/70 Shoulder internal rotation 60/60 Shoulder extension Cervical PROM = AROM, very limited, increased gaurding in all directions making true assessment difficult  Accessory Motions/Glides Glenohumeral: Normal in all directions with gaurded endfeel Acromioclavicular:  Normal bilat Sternoclavicular: Normal bilat  Sensation Grossly intact to light touch bilateral UE as determined by testing dermatomes C2-T2 Proprioception and hot/cold testing deferred on this date  Outcome Measures Quick DASH: 56.8% SPADI:   Ther-Ex    HEP [id number]                                       Patient will benefit from skilled therapeutic intervention in order to improve the following deficits and impairments:     Visit Diagnosis: Stiffness of right shoulder, not elsewhere classified  Cervicalgia     Problem List There are no active problems to display for this patient.  Shelton Silvas PT, DPT Shelton Silvas 06/11/2019, 11:02 AM  [place] [facility] [address], [place], [address] Phone: [phone]   Fax:  [phone]  Name: Whitney Mclean MRN: [id number] Date of Birth: 05-13-1966

## 2019-06-11 NOTE — Therapy (Signed)
[place] [facility] [address] [place], [address] Phone: [phone]   Fax:  [phone]  Occupational Therapy Evaluation  Patient Details  Name: Whitney Mclean MRN: [id number] Date of Birth: Jul 22, 1966 Referring Provider (OT): Artis Delay   Encounter Date: 06/11/2019  OT End of Session - 06/11/19 1122    Visit Number  1    Number of Visits  12    Date for OT Re-Evaluation  07/23/19    OT Start Time  0931    OT Stop Time  1028    OT Time Calculation (min)  57 min    Activity Tolerance  Patient tolerated treatment well    Behavior During Therapy  [facility] for tasks assessed/performed       Past Medical History:  Diagnosis Date  . Oral cancer [facility])     Past Surgical History:  Procedure Laterality Date  . ABDOMINAL HYSTERECTOMY      There were no vitals filed for this visit.  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  Yes    Pain Score  8     Pain Location  Hand    Pain Orientation  Right;Left    Pain Descriptors / Indicators  Aching    Pain Type  Chronic pain    Pain Onset  More than a month ago    Aggravating Factors   If not taking Tylenol        [facility] OT Assessment - 06/11/19 0001      Assessment   Medical Diagnosis  Scleroderma with joint stiffness     Referring Provider (OT)  Manuella Ghazi, [place]    Onset Date/Surgical Date  10/24/18    Hand Dominance  Right    Prior Therapy  --   March of last year OT      Home  Environment   Lives With  Spouse      AROM   AROM Assessment Site  --   sup/pro WNL, elbow flexion 145, ext R -20 ,L -45   Right Wrist Extension  75 Degrees    Right Wrist Flexion  55 Degrees    Right Wrist Radial Deviation  15 Degrees    Right Wrist Ulnar Deviation  35 Degrees    Left Wrist Extension  65 Degrees    Left Wrist Flexion  35 Degrees    Left Wrist Radial Deviation  20 Degrees    Left Wrist Ulnar Deviation  20 Degrees      Right Hand AROM   R Thumb MCP 0-60  40 Degrees    R Thumb  IP 0-80  0 Degrees    R Thumb Radial ABduction/ADduction 0-55  50    R Thumb Palmar ABduction/ADduction 0-45  50    R Thumb Opposition to Index  --   opposition to distal fold of 5th    R Index  MCP 0-90  70 Degrees    R Index PIP 0-100  80 Degrees   -35   R Long  MCP 0-90  70 Degrees    R Long PIP 0-100  90 Degrees   -45   R Ring  MCP 0-90  65 Degrees    R Ring PIP 0-100  95 Degrees   -45   R Little  MCP 0-90  65 Degrees    R Little PIP 0-100  95 Degrees   -40     Left Hand AROM   L Thumb MCP 0-60  15 Degrees    L Thumb IP 0-80  --   20-60   L Thumb Radial ADduction/ABduction 0-55  35    L Thumb Palmar ADduction/ABduction 0-45  35    L Thumb Opposition to Index  --   Opposition to 4th digit   L Index  MCP 0-90  30 Degrees    L Index PIP 0-100  100 Degrees   -65   L Long  MCP 0-90  30 Degrees    L Long PIP 0-100  100 Degrees   -75   L Ring  MCP 0-90  10 Degrees    L Ring PIP 0-100  100 Degrees   -75   L Little  MCP 0-90  10 Degrees    L Little PIP 0-100  100 Degrees   -65      done knuckle bender splint in clinic on R and L - with great success for MC flexion - pt to keep pull under 3/10   Hand out provided and review with pt HEP :     Moist heat Knuckle bender to bilateral hands with pull less than 3/10 for 3 min to increase MC flexion  With PROM to PIP extention while in knuckle bender Gentle PROM for L thumb PA and RA  Opposition to all digits and slide down 5th   2-3 x day  In shower PROM for wrist flexion  AROM for forearm and wrist in all planes               OT Education - 06/11/19 1121    Education provided  Yes    Education Details  findings of eval , compare to last year ROM , POC and HEP    Person(s) Educated  Patient    Methods  Explanation;Demonstration;Tactile cues;Verbal cues;Handout    Comprehension  Returned demonstration;Verbalized understanding       OT Short Term Goals - 06/11/19 1127      OT SHORT TERM GOAL #1   Title  Pt to be independent in HEP to use splint correctly ,  increase AROM  in  bilateral MC flexion , PIP extention ,  wrist flexion and L thumb    Baseline  no knowledge - see flowsheet for measurements - worse than March 2019    Time  4    Period  Weeks    Status  New    Target Date  07/09/19        OT Long Term Goals - 06/11/19 1129      OT LONG TERM GOAL #1   Title  AROM in bilateral  hands improve with more than 10 points on function score with PRWHE    Baseline  see flowsheet for measurements for ROM - 37/50 at eval on FUnction score PRWHE    Time  6    Period  Weeks    Status  New    Target Date  07/23/19      OT LONG TERM GOAL #2   Title  Pt  have knowledge and verbalize use of more than 3 AE to increase ease of doing ADL's and IADL's    Baseline  no knowledge  -hard time open packages, use scissors, writing , driving ,  hair care, dressing , cleaning    Time  5    Period  Weeks    Status  New    Target Date  07/16/19      OT LONG TERM GOAL #3   Title  Bilateral grip strength to be at least more than 15 lbs to turn doorknob, cut food, , squeeze washcloth    Baseline  NT - very little MC flexion - and 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 -  assess next time    Time  6    Period  Weeks    Status  New    Target Date  07/23/19            Plan - 06/11/19 1122    Clinical Impression Statement  Pt present at OT eval with diagnosis of Scleroderma - pt with bilateral hand stiffness - worse than when seen by this OT in March 2019 - decrease wrist flexion , elbow extention , L thumb in all planes ,  bilateral MC flexion and PIP extention - limiting her functional use of bilateral hands in ADL's and IADL's - pt can benefit from therapy to increase ROM , strength , functional use and education of AE to ease use of hands in ADL's and IADL's    OT Occupational Profile and History  Problem Focused Assessment - Including review of records relating to presenting problem    Occupational performance deficits (Please refer to evaluation for details):  ADL's;IADL's;Work;Play;Leisure;Social Participation    Body Structure / Function / Physical Skills  ADL;Decreased knowledge of use of DME;Flexibility;ROM;UE functional use;FMC;Dexterity;Pain;Strength;IADL    Rehab Potential  Fair    Clinical Decision Making  Several treatment options, min-mod task modification necessary    Comorbidities Affecting Occupational Performance:  May have comorbidities impacting occupational performance   Scleroderma chronic condition   Modification or Assistance to Complete Evaluation   Min-Moderate modification of tasks or assist with assess necessary to complete eval    OT Frequency  2x / week    OT Duration  6 weeks    OT Treatment/Interventions  Self-care/ADL training;Therapeutic exercise;Patient/family education;Paraffin;Manual Therapy;Passive range of motion;Splinting;Moist Heat    Plan  assess progress and tolerance doing HEP - modify as needed    OT Home Exercise Plan  see pt instruction    Consulted and Agree with Plan of Care  Patient       Patient will benefit from skilled therapeutic intervention in order to improve the following deficits and  impairments:   Body Structure / Function / Physical Skills: ADL, Decreased knowledge of use of DME, Flexibility, ROM, UE functional use, FMC, Dexterity, Pain, Strength, IADL       Visit Diagnosis: Stiffness of right hand, not elsewhere classified - Plan: Ot plan of care cert/re-cert  Stiffness of left hand, not elsewhere classified -  Plan: Ot plan of care cert/re-cert  Muscle weakness (generalized) - Plan: Ot plan of care cert/re-cert  Pain in left hand - Plan: Ot plan of care cert/re-cert  Pain in right hand - Plan: Ot plan of care cert/re-cert    Problem List There are no active problems to display for this patient.   Rosalyn Gess OTR/L,CLT 06/11/2019, 11:41 AM  [place] [facility] [address] [place], [address] Phone: [phone]   Fax:  [phone]  Name: Whitney Mclean MRN: [id number] Date of Birth: 12/31/1965

## 2019-06-11 NOTE — Patient Instructions (Signed)
Moist heat Knuckle bender to bilateral hands with pull less than 3/10 for 3 min to increase MC flexion  With PROM to PIP extention while in knuckle bender Gentle PROM for L thumb PA and RA  Opposition to all digits and slide down 5th   2-3 x day  In shower PROM for wrist flexion  AROM for forearm and wrist in all planes

## 2019-06-14 ENCOUNTER — Other Ambulatory Visit: Payer: Self-pay

## 2019-06-14 ENCOUNTER — Encounter: Payer: Self-pay | Admitting: Physical Therapy

## 2019-06-14 ENCOUNTER — Ambulatory Visit: Payer: BC Managed Care – PPO | Admitting: Occupational Therapy

## 2019-06-14 ENCOUNTER — Ambulatory Visit: Payer: BC Managed Care – PPO | Admitting: Physical Therapy

## 2019-06-14 DIAGNOSIS — M25641 Stiffness of right hand, not elsewhere classified: Secondary | ICD-10-CM

## 2019-06-14 DIAGNOSIS — M79641 Pain in right hand: Secondary | ICD-10-CM

## 2019-06-14 DIAGNOSIS — M25642 Stiffness of left hand, not elsewhere classified: Secondary | ICD-10-CM

## 2019-06-14 DIAGNOSIS — M79642 Pain in left hand: Secondary | ICD-10-CM

## 2019-06-14 DIAGNOSIS — M6281 Muscle weakness (generalized): Secondary | ICD-10-CM

## 2019-06-14 NOTE — Therapy (Signed)
[place] [facility] [address], [place], [address] Phone: [phone]   Fax:  [phone]  Physical Therapy Treatment  Patient Details  Name: Whitney Mclean MRN: [id number] Date of Birth: November 18, 1965 No data recorded  Encounter Date: 06/14/2019  PT End of Session - 06/14/19 1251    Visit Number  2    Number of Visits  17    Date for PT Re-Evaluation  08/05/19    PT Start Time  0900    PT Stop Time  0945    PT Time Calculation (min)  45 min    Activity Tolerance  Patient tolerated treatment well    Behavior During Therapy  [facility] for tasks assessed/performed       Past Medical History:  Diagnosis Date  . Oral cancer [facility])     Past Surgical History:  Procedure Laterality Date  . ABDOMINAL HYSTERECTOMY      There were no vitals filed for this visit.  Subjective Assessment - 06/14/19 0901    Subjective  Patient reports minimal pain over the weekend. Motion is about the same. Reports HEP is going well.    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 Shoulder pain started 3 months ago, no mechanism of injury, was getting better with heat. Reports she had pain for a about a month, and for the past 2 months the shoulder has been stiff. Has associated neck stiffness since her surgery last year . 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 Has trouble washing her hair, cannot tie her shoes d/t reaching and hand dysfunction, cannot sit up and hold her head up without neck discomfort at her neck, cannot dress herself normally. No pain in shoulder/neck over the past week, describes discomfort/stiffness. 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    Limitations  Sitting;Writing;House hold activities;Lifting    How long can you sit  comfortably?  30    How long can you stand comfortably?  unlimited    How long can you walk comfortably?  unlimited    Diagnostic tests  None on shoulder    Patient Stated Goals  Increase shoulder strength and mobility         Manual PROM shoulder in all directions into pain free/cusp of pain ROM 5-10sec holds G2 increased to G3 post GHJ mobs 30sec boutas 4 bouts for decreased pain > increase ROM, with motion into flex/ER/IR 6 bouts GHJ distraction 10sec distraction 10sec relax x10  During all shoulder manual techniques moist heat applied to R cervical musculature, following STM to R lateral cervical musculature Passive cervical rotation and lateral bending to L , and ext off edge of mat table with support    Ther-Ex - Prone Ts 3x 10 with TC for scapular retraction + depression with good carry over following - Upper thoracic ext over 1/2 foam roll with cervical ext x10 with 5sec hold - Pulleys flex and abd x12 with 3-5sec holds - ER AAROM x12 3-5sec holds with max cuing and demo needed for ER without torso rotation  Education throughout session on activity modification to include: prone lying as often as possible (goal 55mins a day 2x), to place TV and visual stimuli to L to promote L cervical rotation, raising computer  height to neutral cspine- instead of having to look down at computer, sleeping on her side in a way that promotes neutral cervical spine or slight L rotation, etc. Patient very receptive                  PT Education - 06/14/19 0948    Education Details  Activity modification    Person(s) Educated  Patient    Methods  Explanation;Demonstration;Tactile cues;Verbal cues    Comprehension  Verbalized understanding;Returned demonstration;Verbal cues required;Tactile cues required       PT Short Term Goals - 06/11/19 1115      PT SHORT TERM GOAL #1   Title  Pt will be independent with HEP in order to improve strength and decrease pain in order to improve  pain-free function at home and work.    Time  4    Period  Weeks    Status  New        PT Long Term Goals - 06/11/19 1106      PT LONG TERM GOAL #1   Title  Pt will decrease quick DASH score by at least 8% in order to demonstrate clinically significant reduction in disability.    Baseline  06/11/19 56.8%    Time  8    Period  Weeks    Status  New      PT LONG TERM GOAL #2   Title  Patient will demonstrate full cervical ROM in order to drive safely    Baseline  06/11/19 R/L rotation 55d/10d lateral bending 30d/15d extension 0d    Time  8    Period  Weeks    Status  New      PT LONG TERM GOAL #3   Title  Patient will demonstrate full R shoulder ROM in order to complete ADLs    Baseline  06/11/19 see eval    Time  8    Period  Weeks            Plan - 06/15/19 1338    Clinical Impression Statement  Pt is able to obtain some increased motion following manual techniques, but made difficult d/t scleroderma. Heavy education given to patient throughout session on combatting scleroderma tightening to maintain motion she does have availible, and hopes to increase. Patient very resceptive to education. Patient able to complete all therex for postural strengthening with good carry over of PT cuing. PT will continue progression as able.    Personal Factors and Comorbidities  Age;Behavior Pattern;Comorbidity 1;Comorbidity 2;Past/Current Experience;Fitness;Sex;Time since onset of injury/illness/exacerbation    Comorbidities  scleroderma, cancer    Examination-Activity Limitations  Reach Overhead;Self Feeding;Dressing;Sleep;Lift    Examination-Participation Restrictions  Laundry;Cleaning;Community Activity;Driving    Stability/Clinical Decision Making  Evolving/Moderate complexity    Clinical Decision Making  Moderate    Rehab Potential  Good    PT Frequency  2x / week    PT Duration  8 weeks    PT Treatment/Interventions  ADLs/Self Care Home Management;Aquatic Therapy;Moist  Heat;Traction;Ultrasound;Cryotherapy;Parrafin;Functional mobility training;Neuromuscular re-education;Taping;Dry needling;Passive range of motion;Joint Manipulations;Spinal Manipulations;Patient/family education;Therapeutic exercise;Manual techniques;Therapeutic activities;Electrical Stimulation;Fluidtherapy;Iontophoresis 4mg /ml Dexamethasone    PT Next Visit Plan  Increase cervical ROM; postural restoration    Consulted and Agree with Plan of Care  Patient       Patient will benefit from skilled therapeutic intervention in order to improve the following deficits and impairments:  Decreased endurance, Decreased mobility, Pain, Postural dysfunction, Impaired UE functional use, Impaired flexibility, Increased fascial restricitons, Decreased strength, Decreased activity  tolerance, Decreased range of motion, Decreased scar mobility, Impaired tone, Improper body mechanics, Hypomobility  Visit Diagnosis: Stiffness of right hand, not elsewhere classified  Stiffness of left hand, not elsewhere classified  Muscle weakness (generalized)     Problem List There are no active problems to display for this patient.  Shelton Silvas PT, DPT Shelton Silvas 06/15/2019, 1:44 PM  [place] [facility] [address] [place], [address] Phone: [phone]   Fax:  [phone]  Name: Whitney Mclean MRN: [id number] Date of Birth: Jul 21, 1966

## 2019-06-14 NOTE — Therapy (Signed)
[place] [facility] [address] [place], [address] Phone: [phone]   Fax:  [phone]  Occupational Therapy Treatment  Patient Details  Name: Whitney Mclean MRN: [id number] Date of Birth: 09-Mar-1966 Referring Provider (OT): Artis Delay   Encounter Date: 06/14/2019  OT End of Session - 06/14/19 0903    Visit Number  2    Number of Visits  12    Date for OT Re-Evaluation  07/23/19    OT Start Time  0815    OT Stop Time  0858    OT Time Calculation (min)  43 min    Activity Tolerance  Patient tolerated treatment well    Behavior During Therapy  [facility] for tasks assessed/performed       Past Medical History:  Diagnosis Date  . Oral cancer [facility])     Past Surgical History:  Procedure Laterality Date  . ABDOMINAL HYSTERECTOMY      There were no vitals filed for this visit.  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  Yes    Pain Score  6     Pain Location  Hand    Pain Orientation  Left    Pain Descriptors / Indicators  Aching    Pain Type  Chronic pain    Pain Onset  More than a month ago    Pain Frequency  Constant         [facility] OT Assessment - 06/14/19 0001      Strength   Right Hand Grip (lbs)  27    Right Hand Lateral Pinch  8 lbs    Right Hand 3 Point Pinch  7 lbs    Left Hand Grip  (lbs)  24    Left Hand Lateral Pinch  9 lbs    Left Hand 3 Point Pinch  5 lbs      Right Hand AROM   R Index  MCP 0-90  80 Degrees    R Index PIP 0-100  --   -30   R Long  MCP 0-90  76 Degrees    R Long PIP 0-100  --   -40   R Ring  MCP 0-90  80 Degrees    R Ring PIP 0-100  --   -45   R Little  MCP 0-90  80 Degrees    R Little PIP 0-100  --   -40     Left Hand AROM   L Index  MCP 0-90  55 Degrees    L Index PIP 0-100  --   -55   L Long  MCP 0-90  55 Degrees    L Long PIP 0-100  --   -  65   L Ring  MCP 0-90  35 Degrees    L Ring PIP 0-100  --   -75   L Little  MCP 0-90  25 Degrees    L Little PIP 0-100  --   -65       Pt showed some progress in MC flexion R more than L  And PIP extention           OT Treatments/Exercises (OP) - 06/14/19 0001      RUE Paraffin   Number Minutes Paraffin  8 Minutes    RUE Paraffin Location  Hand      LUE Paraffin   Number Minutes Paraffin  8 Minutes      Soft tissue mobs to volar forearm and palm with graston tool nr 2 brushing and sweeping  And webspace by OT and joint mobs to [facility]  Interlocking in digits for ABD - Pt ed to do at home AAROM for wrist flexion , extention and Sup/pro, RD , UD  10 reps - tight in end range sup on L   Apply knuckle bender splint on L and R - while doing L hand soft tissue - on the R  And review again with pt donning and wearing of it  And PROM for PIP extention in splint  3 min at home  2-3 x day   Done grip strength and prhension - see flow sheet  And PRWHE function score done this date - 17.5/50        OT Education - 06/14/19 0903    Education provided  Yes    Education Details  progress and HEP review    Person(s) Educated  Patient    Methods  Explanation;Demonstration;Tactile cues;Verbal cues;Handout    Comprehension  Returned demonstration;Verbalized understanding       OT Short Term Goals - 06/11/19 1127      OT SHORT TERM GOAL #1   Title  Pt to be independent in HEP  to use splint correctly ,  increase AROM  in  bilateral MC flexion , PIP extention ,  wrist flexion and L thumb    Baseline  no knowledge - see flowsheet for measurements - worse than March 2019    Time  4    Period  Weeks    Status  New    Target Date  07/09/19        OT Long Term Goals - 06/11/19 1129      OT LONG TERM GOAL #1   Title  AROM in bilateral hands improve with more than 10 points on function score with PRWHE    Baseline  see flowsheet for measurements for ROM - 37/50 at eval on FUnction score PRWHE    Time  6    Period  Weeks    Status  New    Target Date  07/23/19      OT LONG TERM GOAL #2   Title  Pt  have knowledge and verbalize use of more than 3 AE to increase ease of doing ADL's and IADL's    Baseline  no knowledge  -hard time open packages, use scissors, writing , driving ,  hair care, dressing , cleaning    Time  5    Period  Weeks    Status  New    Target Date  07/16/19      OT LONG TERM GOAL #3   Title  Bilateral grip strength to be at least more  than 15 lbs to turn doorknob, cut food, , squeeze washcloth    Baseline  NT - very little MC flexion - and 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 - assess next time    Time  6    Period  Weeks    Status  New    Target Date  07/23/19            Plan - 06/14/19 [id number]    Clinical Impression Statement  Pt made progress in [place] flexion in R and L hand - PIP 's extention about the same on L hand - but R improve too - pt to cont with knucklebender splint but to no extend [facility] - allow MC flexion stretch and do PROM PIP extention in splint    OT Occupational Profile and History  Problem Focused Assessment - Including review of records relating to presenting problem    Occupational performance deficits (Please refer to evaluation for details):  ADL's;IADL's;Work;Play;Leisure;Social Participation    Body Structure / Function / Physical Skills  ADL;Decreased knowledge of use of DME;Flexibility;ROM;UE functional  use;FMC;Dexterity;Pain;Strength;IADL    Rehab Potential  Fair    Clinical Decision Making  Several treatment options, min-mod task modification necessary    Comorbidities Affecting Occupational Performance:  May have comorbidities impacting occupational performance    Modification or Assistance to Complete Evaluation   Min-Moderate modification of tasks or assist with assess necessary to complete eval    OT Frequency  2x / week    OT Duration  6 weeks    OT Treatment/Interventions  Self-care/ADL training;Therapeutic exercise;Patient/family education;Paraffin;Manual Therapy;Passive range of motion;Splinting;Moist Heat    Plan  assess progress and tolerance doing HEP - modify as needed    Consulted and Agree with Plan of Care  Patient       Patient will benefit from skilled therapeutic intervention in order to improve the following deficits and impairments:   Body Structure / Function / Physical Skills: ADL, Decreased knowledge of use of DME, Flexibility, ROM, UE functional use, FMC, Dexterity, Pain, Strength, IADL       Visit Diagnosis: Stiffness of right hand, not elsewhere classified  Stiffness of left hand, not elsewhere classified  Muscle weakness (generalized)  Pain in left hand  Pain in right hand    Problem List There are no active problems to display for this patient.   Rosalyn Gess OTR/L,CLT 06/14/2019, 9:07 AM  [place] [facility] [address], [place], [address] Phone: [phone]   Fax:  [phone]  Name: KIAH CAMPOLI MRN: [id number] Date of Birth: 1965/09/30

## 2019-06-14 NOTE — Patient Instructions (Signed)
Same but do some interlocking or ABD stretches to digits

## 2019-06-17 ENCOUNTER — Encounter: Payer: Self-pay | Admitting: Physical Therapy

## 2019-06-17 ENCOUNTER — Other Ambulatory Visit: Payer: Self-pay

## 2019-06-17 ENCOUNTER — Ambulatory Visit: Payer: BC Managed Care – PPO | Admitting: Physical Therapy

## 2019-06-17 ENCOUNTER — Ambulatory Visit: Payer: BC Managed Care – PPO | Admitting: Occupational Therapy

## 2019-06-17 DIAGNOSIS — M25641 Stiffness of right hand, not elsewhere classified: Secondary | ICD-10-CM | POA: Diagnosis not present

## 2019-06-17 DIAGNOSIS — M79642 Pain in left hand: Secondary | ICD-10-CM

## 2019-06-17 DIAGNOSIS — M79641 Pain in right hand: Secondary | ICD-10-CM

## 2019-06-17 DIAGNOSIS — M25642 Stiffness of left hand, not elsewhere classified: Secondary | ICD-10-CM

## 2019-06-17 DIAGNOSIS — M6281 Muscle weakness (generalized): Secondary | ICD-10-CM

## 2019-06-17 DIAGNOSIS — M542 Cervicalgia: Secondary | ICD-10-CM

## 2019-06-17 DIAGNOSIS — M25611 Stiffness of right shoulder, not elsewhere classified: Secondary | ICD-10-CM

## 2019-06-17 NOTE — Therapy (Signed)
[place] [facility] [address], [place], [address] Phone: [phone]   Fax:  [phone]  Occupational Therapy Treatment  Patient Details  Name: Whitney Mclean MRN: [id number] Date of Birth: 1965-11-28 Referring Provider (OT): Artis Delay   Encounter Date: 06/17/2019  OT End of Session - 06/17/19 0837    Visit Number  3    Number of Visits  12    Date for OT Re-Evaluation  07/23/19    OT Start Time  0816    OT Stop Time  0900    OT Time Calculation (min)  44 min    Activity Tolerance  Patient tolerated treatment well    Behavior During Therapy  [facility] for tasks assessed/performed       Past Medical History:  Diagnosis Date  . Oral cancer [facility])     Past Surgical History:  Procedure Laterality Date  . ABDOMINAL HYSTERECTOMY      There were no vitals filed for this visit.  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  Yes    Pain Score  3     Pain Location  Hand    Pain Orientation  Left    Pain Descriptors / Indicators  Aching    Pain Type  Chronic pain    Pain Onset  More than a month ago         [facility] OT Assessment - 06/17/19 0001      Right Hand AROM   R Index  MCP 0-90  75 Degrees    R Index PIP 0-100  --   -30   R Long  MCP 0-90  80 Degrees    R Long PIP 0-100  --   -40   R Ring  MCP 0-90  82 Degrees    R Ring PIP 0-100  --   -45   R Little  MCP 0-90  80 Degrees    R Little PIP 0-100  --   -45     Left Hand AROM   L Index  MCP 0-90  55 Degrees    L Index PIP 0-100  --   -60   L Long  MCP 0-90  55 Degrees    L Long PIP 0-100  --   -60   L Ring  MCP 0-90  35 Degrees    L Ring PIP 0-100  --   -75   L Little  MCP 0-90  25 Degrees    L Little PIP 0-100  --   -75      measurements taken - see flowsheet        OT  Treatments/Exercises (OP) - 06/17/19 0001      RUE Paraffin   Number Minutes Paraffin  8 Minutes    RUE Paraffin Location  Hand    Comments  prior to knucklebender       LUE Paraffin   Number Minutes Paraffin  8 Minutes    LUE Paraffin Location  Hand    Comments  prior to knucklebender        Soft tissue mobs to volar forearm and palm with graston tool nr 2 brushing and sweeping  - and over volar digits  And webspace by OT and joint mobs to [facility]  Interlocking in digits for ABD - Pt ed to do at home AAROM for wrist flexion , extention and Sup/pro, RD , UD  10 reps - tight in end range sup on L   Apply knuckle bender splint on L and R - while doing the other hands soft tissue  And review again with pt donning and wearing of knuckle bender -   And add this date LMB PIP extention splint for R hand use 1-2 min each digit And PROM for PIP extention in splint   composite flexion PROM  3 min at home  2-3 x day   Thumb RA stretch in L change to PROM for [facility] and DIP         OT Education - 06/17/19 0836    Education provided  Yes    Education Details  progress and HEP review    Person(s) Educated  Patient    Methods  Explanation;Demonstration;Tactile cues;Verbal cues;Handout    Comprehension  Returned demonstration;Verbalized understanding       OT Short Term Goals - 06/11/19 1127      OT SHORT TERM GOAL #1   Title  Pt to be independent in HEP to use splint correctly ,  increase AROM   in  bilateral MC flexion , PIP extention ,  wrist flexion and L thumb    Baseline  no knowledge - see flowsheet for measurements - worse than March 2019    Time  4    Period  Weeks    Status  New    Target Date  07/09/19        OT Long Term Goals - 06/11/19 1129      OT LONG TERM GOAL #1   Title  AROM in bilateral hands improve with more than 10 points on function score with PRWHE    Baseline  see flowsheet for measurements for ROM - 37/50 at eval on FUnction score PRWHE    Time  6    Period  Weeks    Status  New    Target Date  07/23/19      OT LONG TERM GOAL #2   Title  Pt  have knowledge and verbalize use of more than 3 AE to increase ease of doing ADL's and IADL's    Baseline  no knowledge  -hard time open packages, use scissors, writing , driving ,  hair care, dressing , cleaning    Time  5    Period  Weeks    Status  New    Target Date  07/16/19      OT LONG TERM GOAL #3   Title  Bilateral grip strength to be at least more than 15 lbs to turn doorknob, cut food, , squeeze washcloth    Baseline  NT - very little MC flexion - and 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 -  L worse than R - assess next time    Time  6    Period  Weeks    Status  New    Target Date  07/23/19            Plan - 06/17/19 [id number]    Clinical Impression Statement  Pt did not make as great progress this time as last time - but discuss that balance between enough stretches but not increase pain to much - focus on MC flexion , PIP extention -change L thumb RA stretch to [facility] and DIP flexion    OT Occupational Profile and History  Problem Focused Assessment - Including review of records relating to presenting problem    Occupational performance deficits (Please refer to evaluation for details):  ADL's;IADL's;Work;Play;Leisure;Social Participation    Body Structure / Function / Physical Skills  ADL;Decreased knowledge of use of DME;Flexibility;ROM;UE functional use;FMC;Dexterity;Pain;Strength;IADL    Rehab  Potential  Fair    Clinical Decision Making  Several treatment options, min-mod task modification necessary    Comorbidities Affecting Occupational Performance:  May have comorbidities impacting occupational performance    Modification or Assistance to Complete Evaluation   Min-Moderate modification of tasks or assist with assess necessary to complete eval    OT Frequency  2x / week    OT Duration  6 weeks    OT Treatment/Interventions  Self-care/ADL training;Therapeutic exercise;Patient/family education;Paraffin;Manual Therapy;Passive range of motion;Splinting;Moist Heat    Plan  assess progress and tolerance doing HEP - tolerance use if LMB for PIP on R hand    OT Home Exercise Plan  see pt instruction    Consulted and Agree with Plan of Care  Patient       Patient will benefit from skilled therapeutic intervention in order to improve the following deficits and impairments:   Body Structure / Function / Physical Skills: ADL, Decreased knowledge of use of DME, Flexibility, ROM, UE functional use, FMC, Dexterity, Pain, Strength, IADL       Visit Diagnosis: Stiffness of right hand, not elsewhere classified  Stiffness of left hand, not elsewhere classified  Muscle weakness (generalized)  Pain in left hand  Pain in right hand    Problem List There are no active problems to display for this patient.   Rosalyn Gess OTR/L,CLT 06/17/2019, 12:53 PM  [place] [facility] [address], [place], [address] Phone: [phone]   Fax:  [phone]  Name: JERMANY WAN MRN: [id number] Date of Birth: Jun 12, 1966

## 2019-06-17 NOTE — Therapy (Signed)
[facility] [address], [place], [address] Phone: [phone]   Fax:  [phone]  Physical Therapy Treatment  Patient Details  Name: Whitney Mclean MRN: [id number] Date of Birth: 1966-06-03 No data recorded  Encounter Date: 06/17/2019  PT End of Session - 06/17/19 0933    Visit Number  3    Number of Visits  17    Date for PT Re-Evaluation  08/05/19    PT Start Time  0900    PT Stop Time  0940    PT Time Calculation (min)  40 min    Activity Tolerance  Patient tolerated treatment well    Behavior During Therapy  [facility] for tasks assessed/performed       Past Medical History:  Diagnosis Date  . Oral cancer [facility])     Past Surgical History:  Procedure Laterality Date  . ABDOMINAL HYSTERECTOMY      There were no vitals filed for this visit.  Subjective Assessment - 06/17/19 0905    Subjective  Reports good compliance with HEP from last session, reports she thinks her rotation motion is a little better.    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 Shoulder pain started 3 months ago, no mechanism of injury, was getting better with heat. Reports she had pain for a about a month, and for the past 2 months the shoulder has been stiff. Has associated neck stiffness since her surgery last year . 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 Has trouble washing her hair, cannot tie her shoes d/t reaching and hand dysfunction, cannot sit up and hold her head up without neck discomfort at her neck, cannot dress herself normally. No pain in shoulder/neck over the past week, describes discomfort/stiffness. 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    Limitations  Sitting;Writing;House hold activities;Lifting    How long can you  sit comfortably?  30    How long can you stand comfortably?  unlimited    How long can you walk comfortably?  unlimited    Diagnostic tests  None on shoulder    Patient Stated Goals  Increase shoulder strength and mobility       Manual PROM shoulder in all directions into pain free/cusp of pain ROM 5-10sec holds G2 increased to G3 post GHJ mobs 30sec boutas 4 bouts for decreased pain > increase ROM, with motion into flex/ER/IR 6 bouts GHJ distraction 10sec distraction 10sec relax x10   Passive cervical rotation and lateral bending to L , and ext off edge of mat table with support    Ther-Ex - Prone Ts 3x 10 with TC for proper positioning maintaining available 90d of motion - Supine upper thoracic ext over 1/2 foam roll with cervical ext, holding pipe overhead with combined bilat shoulder flex (wide grip for chest stretch) x10 with 5sec hold - SNAG with active cervical L rotation + ext 2x 10 with good carry over following demo and min cuing - UE ranger x10 clockwise/counterclockwise with PT assist with overhead reaching occasionally                          PT Education - 06/17/19 0932    Education Details  therex form, HEP review    Person(s) Educated  Patient    Methods  Explanation;Demonstration;Tactile cues;Verbal cues    Comprehension  Verbalized understanding;Returned demonstration;Verbal cues required;Tactile cues required       PT Short Term Goals - 06/11/19 1115      PT SHORT TERM GOAL #1   Title  Pt will be independent with HEP in order to improve strength and decrease pain in order to improve pain-free function at home and work.    Time  4    Period  Weeks    Status  New        PT Long Term Goals - 06/11/19 1106      PT LONG TERM GOAL #1   Title  Pt will decrease quick DASH score by at least 8% in order to demonstrate clinically significant reduction in disability.    Baseline  06/11/19 56.8%    Time  8    Period  Weeks    Status  New       PT LONG TERM GOAL #2   Title  Patient will demonstrate full cervical ROM in order to drive safely    Baseline  06/11/19 R/L rotation 55d/10d lateral bending 30d/15d extension 0d    Time  8    Period  Weeks    Status  New      PT LONG TERM GOAL #3   Title  Patient will demonstrate full R shoulder ROM in order to complete ADLs    Baseline  06/11/19 see eval    Time  8    Period  Weeks            Plan - 06/17/19 0944    Clinical Impression Statement  Patient with some increased motion following manual techniques. PT continued thewrex progression for shoulder and cervical ROM with postural carry over with good succeess. PT will continue progression as able.    Personal Factors and Comorbidities  Age;Behavior Pattern;Comorbidity 1;Comorbidity 2;Past/Current Experience;Fitness;Sex;Time since onset of injury/illness/exacerbation    Comorbidities  scleroderma, cancer    Examination-Activity Limitations  Reach Overhead;Self Feeding;Dressing;Sleep;Lift    Examination-Participation Restrictions  Laundry;Cleaning;Community Activity;Driving    Stability/Clinical Decision Making  Evolving/Moderate complexity    Clinical Decision Making  Moderate    Rehab Potential  Good    PT Frequency  2x / week    PT Duration  8 weeks    PT Treatment/Interventions  ADLs/Self Care Home Management;Aquatic Therapy;Moist Heat;Traction;Ultrasound;Cryotherapy;Parrafin;Functional mobility training;Neuromuscular re-education;Taping;Dry needling;Passive range of motion;Joint Manipulations;Spinal Manipulations;Patient/family education;Therapeutic exercise;Manual techniques;Therapeutic activities;Electrical Stimulation;Fluidtherapy;Iontophoresis 4mg /ml Dexamethasone    PT Next Visit Plan  Increase cervical ROM; postural restoration    Consulted and Agree with Plan of Care  Patient       Patient will benefit from skilled therapeutic intervention in order to improve the following deficits and impairments:  Decreased  endurance, Decreased mobility, Pain, Postural dysfunction, Impaired UE functional use, Impaired flexibility, Increased fascial restricitons, Decreased strength, Decreased activity tolerance, Decreased range of motion, Decreased scar mobility, Impaired tone, Improper body mechanics, Hypomobility  Visit Diagnosis: Cervicalgia  Stiffness of right shoulder, not elsewhere classified     Problem List There are no active problems to display for this patient.  Shelton Silvas PT, DPT Shelton Silvas 06/17/2019, 9:49 AM  [place] [facility] [address], [place], [address] Phone: [phone]   Fax:  [phone]  Name: Whitney Mclean MRN: [id number] Date of Birth: October 03, 1965

## 2019-06-21 ENCOUNTER — Ambulatory Visit: Payer: BC Managed Care – PPO | Admitting: Occupational Therapy

## 2019-06-21 ENCOUNTER — Encounter: Payer: Self-pay | Admitting: Occupational Therapy

## 2019-06-21 ENCOUNTER — Other Ambulatory Visit: Payer: Self-pay

## 2019-06-21 ENCOUNTER — Ambulatory Visit: Payer: BC Managed Care – PPO

## 2019-06-21 DIAGNOSIS — M79642 Pain in left hand: Secondary | ICD-10-CM

## 2019-06-21 DIAGNOSIS — M25642 Stiffness of left hand, not elsewhere classified: Secondary | ICD-10-CM

## 2019-06-21 DIAGNOSIS — M25611 Stiffness of right shoulder, not elsewhere classified: Secondary | ICD-10-CM

## 2019-06-21 DIAGNOSIS — R600 Localized edema: Secondary | ICD-10-CM

## 2019-06-21 DIAGNOSIS — M542 Cervicalgia: Secondary | ICD-10-CM

## 2019-06-21 DIAGNOSIS — M25641 Stiffness of right hand, not elsewhere classified: Secondary | ICD-10-CM

## 2019-06-21 DIAGNOSIS — M6281 Muscle weakness (generalized): Secondary | ICD-10-CM

## 2019-06-21 DIAGNOSIS — M79641 Pain in right hand: Secondary | ICD-10-CM

## 2019-06-21 NOTE — Therapy (Signed)
[facility] [address] [place], [address] Phone: [phone]   Fax:  [phone]  Physical Therapy Treatment  Patient Details  Name: Whitney Mclean MRN: [id number] Date of Birth: 12-29-1965 No data recorded  Encounter Date: 06/21/2019  PT End of Session - 06/21/19 0905    Visit Number  4    Number of Visits  17    Date for PT Re-Evaluation  08/05/19    PT Start Time  0904    PT Stop Time  0944    PT Time Calculation (min)  40 min    Activity Tolerance  Patient tolerated treatment well;No increased pain    Behavior During Therapy  WFL for tasks assessed/performed       Past Medical History:  Diagnosis Date  . Oral cancer [facility])     Past Surgical History:  Procedure Laterality Date  . ABDOMINAL HYSTERECTOMY      There were no vitals filed for this visit.  Subjective Assessment - 06/21/19 0905    Subjective  Pt reports she is doing well in general, no pain this date. She reports good compliance with exercisres at home.    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 Shoulder pain started 3 months ago, no mechanism of injury, was getting better with heat. Reports she had pain for a about a month, and for the past 2 months the shoulder has been stiff. Has associated neck stiffness since her surgery last year . 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 Has trouble washing her hair, cannot tie her shoes d/t reaching and hand dysfunction, cannot sit up and hold her head up without neck discomfort at her neck, cannot dress herself normally. No pain in shoulder/neck over the past week, describes discomfort/stiffness. 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      INTERVENTION THIS DATE Flexion table slides x20,  seated Scaption table slides x20, standing Abduction table slides x15, standing  GHJ joint distraction with axillary towel  1. Lateral distraction in neutral GHJ 3x30secH, Grade 3 2. Lateral distraction in 90 degrees flexion GHJ 3x30secH Grade 3  3. Lateral distraction in 90 degrees abduction 2x30sec Grade 3 4. Posterior glide in abdct/ER 2x30sec grade 3   Cervical AA/ROM  Left Lateral flexion 15x5secH Left Rotation 15x5secH Cervical extension 15x5secH    PT Short Term Goals - 06/11/19 1115      PT SHORT TERM GOAL #1   Title  Pt will be independent with HEP in order to improve strength and decrease pain in order to improve pain-free function at home and work.    Time  4    Period  Weeks    Status  New        PT Long Term Goals - 06/11/19 1106      PT LONG TERM GOAL #1   Title  Pt will decrease quick DASH score by at least 8% in order to demonstrate clinically significant reduction in disability.    Baseline  06/11/19 56.8%    Time  8    Period  Weeks    Status  New      PT LONG TERM GOAL #2   Title  Patient will demonstrate full cervical ROM in  order to drive safely    Baseline  06/11/19 R/L rotation 55d/10d lateral bending 30d/15d extension 0d    Time  8    Period  Weeks    Status  New      PT LONG TERM GOAL #3   Title  Patient will demonstrate full R shoulder ROM in order to complete ADLs    Baseline  06/11/19 see eval    Time  8    Period  Weeks            Plan - 06/21/19 [id number]    Clinical Impression Statement  In general, patient demonstrating good tolerance to therapy session this date, reasonable accommodations are alllowed in-session to allow adequate rest between activities as needed, as mobilizations can be painful after several repetitions. ROM deficits do notably improve to a mild degree throughout session. Pt demonstrates focused motivation to fully participate in therapy to the best of ability. Will trial sustain low-load-long-duration stretches  for cervical extension. Pt continues to make steady progress toward most goals. No home exercise updates made at this time.    PT Frequency  2x / week    PT Duration  8 weeks    PT Treatment/Interventions  ADLs/Self Care Home Management;Aquatic Therapy;Moist Heat;Traction;Ultrasound;Cryotherapy;Parrafin;Functional mobility training;Neuromuscular re-education;Taping;Dry needling;Passive range of motion;Joint Manipulations;Spinal Manipulations;Patient/family education;Therapeutic exercise;Manual techniques;Therapeutic activities;Electrical Stimulation;Fluidtherapy;Iontophoresis 4mg /ml Dexamethasone    PT Next Visit Plan  Increase cervical ROM; postural restoration    Consulted and Agree with Plan of Care  Patient       Patient will benefit from skilled therapeutic intervention in order to improve the following deficits and impairments:  Decreased endurance, Decreased mobility, Pain, Postural dysfunction, Impaired UE functional use, Impaired flexibility, Increased fascial restricitons, Decreased strength, Decreased activity tolerance, Decreased range of motion, Decreased scar mobility, Impaired tone, Improper body mechanics, Hypomobility  Visit Diagnosis: Stiffness of right shoulder, not elsewhere classified  Stiffness of right hand, not elsewhere classified  Stiffness of left hand, not elsewhere classified  Muscle weakness (generalized)  Pain in left hand  Pain in right hand  Localized edema  Cervicalgia     Problem List There are no active problems to display for this patient. 9:47 AM, 06/21/19 Etta Grandchild, PT, DPT Physical Therapist - [place] [phone] (Office)    , C 06/21/2019, 9:13 AM  [place] [facility] [address] [place], [address] Phone: [phone]   Fax:  [phone]  Name: Whitney Mclean MRN: [id number] Date of Birth: 1966-08-22

## 2019-06-21 NOTE — Therapy (Signed)
[place] [facility] [address], [place], [address] Phone: [phone]   Fax:  [phone]  Occupational Therapy Treatment  Patient Details  Name: JAQUISHA ACEVES MRN: [id number] Date of Birth: Apr 16, 1966 Referring Provider (OT): Artis Delay   Encounter Date: 06/21/2019  OT End of Session - 06/21/19 0829    Visit Number  4    Number of Visits  12    Date for OT Re-Evaluation  07/23/19    OT Start Time  0817    Activity Tolerance  Patient tolerated treatment well    Behavior During Therapy  [facility] for tasks assessed/performed       Past Medical History:  Diagnosis Date  . Oral cancer [facility])     Past Surgical History:  Procedure Laterality Date  . ABDOMINAL HYSTERECTOMY      There were no vitals filed for this visit.  Subjective Assessment - 06/21/19 0826    Subjective   Patient  reports she took some tylenol this am before coming so she doesn't have any pain.  Patient reports her black rubber band broke with the knuckle bender and would like to replace it.    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  No/denies    Pain Score  0-No pain        Manual Therapy: Soft tissue mobs to volar forearm and palm with graston tool nr 2 brushing and sweeping  - and over volar digits  Stretch to webspace by therapist along with  joint mobs to [facility]  Soft tissue mobs to bilateral hands to increase ROM and tissue mobility   Therapeutic Exercise:  Instructed on Interlocking in  digits for ABD - AAROM for wrist flexion , extension and Supination/pronation, radial deviation , ulnar deviation 10 reps - remains tight in end range supination on L   Patient continues to use knuckle bender at home but broke her black band, issued another band as a replacement this date. PROM for PIP extension   composite flexion PROM  3 min at home  2-3 x day    Response to tx: Replaced broken rubber band for knuckle bender this date for continued use at home.  Continued focus on MCP flexion.Exs for supination/pronation with cues.  Patient continues to demonstrate increased stiffness but responds well to treatment.  Encouraged patient to continue with exercises at home on a consistent basis to see good results.  Continue to work towards goals to decrease pain, increase ROM, strength and functional use of hands for necessary daily activities.                       OT Short Term Goals - 06/11/19 1127      OT SHORT TERM GOAL #1   Title  Pt to be independent in HEP to use splint correctly ,  increase AROM  in  bilateral MC flexion , PIP  extention ,  wrist flexion and L thumb    Baseline  no knowledge - see flowsheet for measurements - worse than March 2019    Time  4    Period  Weeks    Status  New    Target Date  07/09/19        OT Long Term Goals - 06/11/19 1129      OT LONG TERM GOAL #1   Title  AROM in bilateral hands improve with more than 10 points on function score with PRWHE    Baseline  see flowsheet for measurements for ROM - 37/50 at eval on FUnction score PRWHE    Time  6    Period  Weeks    Status  New    Target Date  07/23/19      OT LONG TERM GOAL #2   Title  Pt  have knowledge and verbalize use of more than 3 AE to increase ease of doing ADL's and IADL's    Baseline  no knowledge  -hard time open packages, use scissors, writing , driving ,  hair care, dressing , cleaning    Time  5    Period  Weeks    Status  New    Target Date  07/16/19       OT LONG TERM GOAL #3   Title  Bilateral grip strength to be at least more than 15 lbs to turn doorknob, cut food, , squeeze washcloth    Baseline  NT - very little MC flexion - and 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 - assess next time    Time  6    Period  Weeks    Status  New    Target Date  07/23/19            Plan - 06/21/19 0829    OT Occupational Profile and History  Problem Focused Assessment - Including review of records relating to presenting problem    Occupational performance deficits (Please refer to evaluation for details):  ADL's;IADL's;Work;Play;Leisure;Social Participation    Body Structure / Function / Physical Skills  ADL;Decreased knowledge of use of DME;Flexibility;ROM;UE functional use;FMC;Dexterity;Pain;Strength;IADL    Rehab Potential  Fair    Clinical Decision Making  Several treatment options, min-mod task modification necessary    Comorbidities Affecting Occupational Performance:  May have comorbidities impacting occupational performance    Modification or Assistance to Complete Evaluation   Min-Moderate modification of tasks or assist with assess necessary to complete eval    OT Duration  6 weeks    OT Treatment/Interventions  Self-care/ADL training;Therapeutic exercise;Patient/family education;Paraffin;Manual Therapy;Passive range of motion;Splinting;Moist Heat    Consulted and Agree with Plan of Care  Patient       Patient will benefit from skilled therapeutic intervention in order to improve the following deficits and impairments:   Body Structure / Function / Physical Skills: ADL, Decreased knowledge of use of DME, Flexibility, ROM, UE functional use, FMC, Dexterity, Pain, Strength, IADL       Visit Diagnosis: Stiffness of right shoulder, not elsewhere classified  Stiffness of right hand, not elsewhere classified  Stiffness of left hand, not elsewhere classified  Muscle weakness (generalized)  Pain in left hand  Pain in right  hand  Localized edema    Problem List There are no active problems to display for this patient.   Oneita Jolly, OTR/L, CLT   , 06/21/2019, 8:33 AM  [place]  [facility] [address], [place], [address] Phone: [phone]   Fax:  [phone]  Name: JAZALYN ERIKSSON MRN: [id number] Date of Birth: 02-May-1966

## 2019-06-24 ENCOUNTER — Ambulatory Visit: Payer: BC Managed Care – PPO | Attending: Internal Medicine | Admitting: Occupational Therapy

## 2019-06-24 ENCOUNTER — Encounter: Payer: BC Managed Care – PPO | Admitting: Physical Therapy

## 2019-06-24 DIAGNOSIS — M79642 Pain in left hand: Secondary | ICD-10-CM | POA: Insufficient documentation

## 2019-06-24 DIAGNOSIS — M79641 Pain in right hand: Secondary | ICD-10-CM | POA: Insufficient documentation

## 2019-06-24 DIAGNOSIS — M542 Cervicalgia: Secondary | ICD-10-CM | POA: Insufficient documentation

## 2019-06-24 DIAGNOSIS — R600 Localized edema: Secondary | ICD-10-CM | POA: Insufficient documentation

## 2019-06-24 DIAGNOSIS — M6281 Muscle weakness (generalized): Secondary | ICD-10-CM | POA: Insufficient documentation

## 2019-06-24 DIAGNOSIS — M25642 Stiffness of left hand, not elsewhere classified: Secondary | ICD-10-CM | POA: Insufficient documentation

## 2019-06-24 DIAGNOSIS — M25611 Stiffness of right shoulder, not elsewhere classified: Secondary | ICD-10-CM | POA: Insufficient documentation

## 2019-06-24 DIAGNOSIS — M25641 Stiffness of right hand, not elsewhere classified: Secondary | ICD-10-CM | POA: Insufficient documentation

## 2019-06-28 ENCOUNTER — Ambulatory Visit: Payer: BC Managed Care – PPO | Admitting: Occupational Therapy

## 2019-06-28 ENCOUNTER — Ambulatory Visit: Payer: BC Managed Care – PPO

## 2019-06-28 ENCOUNTER — Other Ambulatory Visit: Payer: Self-pay

## 2019-06-28 DIAGNOSIS — M25642 Stiffness of left hand, not elsewhere classified: Secondary | ICD-10-CM

## 2019-06-28 DIAGNOSIS — M79641 Pain in right hand: Secondary | ICD-10-CM | POA: Diagnosis present

## 2019-06-28 DIAGNOSIS — M25641 Stiffness of right hand, not elsewhere classified: Secondary | ICD-10-CM

## 2019-06-28 DIAGNOSIS — M79642 Pain in left hand: Secondary | ICD-10-CM | POA: Diagnosis present

## 2019-06-28 DIAGNOSIS — M6281 Muscle weakness (generalized): Secondary | ICD-10-CM

## 2019-06-28 DIAGNOSIS — M25611 Stiffness of right shoulder, not elsewhere classified: Secondary | ICD-10-CM | POA: Diagnosis present

## 2019-06-28 DIAGNOSIS — R600 Localized edema: Secondary | ICD-10-CM | POA: Diagnosis present

## 2019-06-28 DIAGNOSIS — M542 Cervicalgia: Secondary | ICD-10-CM | POA: Diagnosis present

## 2019-06-28 NOTE — Therapy (Signed)
[place] [address] [place], [address] Phone: [phone]   Fax:  [phone]  Occupational Therapy Treatment  Patient Details  Name: Whitney Mclean MRN: [id number] Date of Birth: 1966/06/07 Referring Provider (OT): Artis Delay   Encounter Date: 06/28/2019  OT End of Session - 06/28/19 0832    Visit Number  5    Number of Visits  12    Date for OT Re-Evaluation  07/23/19    OT Start Time  0802    OT Stop Time  0845    OT Time Calculation (min)  43 min    Activity Tolerance  Patient tolerated treatment well    Behavior During Therapy  [facility] for tasks assessed/performed       Past Medical History:  Diagnosis Date  . Oral cancer [facility])     Past Surgical History:  Procedure Laterality Date  . ABDOMINAL HYSTERECTOMY      There were no vitals filed for this visit.  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  Yes    Pain Score  2     Pain Location  Hand    Pain Orientation  Right;Left    Pain Descriptors / Indicators  Aching    Pain Type  Chronic pain         [facility] OT Assessment - 06/28/19 0001      Strength   Right Hand Grip (lbs)  30    Right Hand Lateral Pinch  8 lbs    Right Hand 3 Point Pinch  7 lbs    Left Hand Grip (lbs)  28    Left Hand Lateral Pinch  8 lbs    Left Hand 3 Point Pinch  6 lbs      Right Hand AROM   R Index  MCP 0-90  75 Degrees    R Long  MCP 0-90  80 Degrees    R Ring  MCP 0-90  80 Degrees    R Little  MCP 0-90  80 Degrees      Left Hand AROM   L Index  MCP 0-90  55 Degrees    L Index PIP 0-100  --   -55   L Long  MCP 0-90  55 Degrees    L Long PIP 0-100  --   -55   L Ring  MCP 0-90  35 Degrees    L Ring PIP 0-100  -75 Degrees    L Little  MCP 0-90  30 Degrees    L Little PIP 0-100  --   -68     Pt made some progress since week ago - in bilateral grip strength , and L  3 point -  Show some progress in [facility] flexion and PIP extention on L hand  Measurements taken - see flow sheet   Progress is slower than at [facility] - and discuss with pt about importance for getting paraffin bath to use at home in conjunction with LMB and knuckle bender splints    cont with PROM for flexion of MC with PIP extention - by OT   and AAAROM and AROM for wrist in all planes         OT Treatments/Exercises (OP) - 06/28/19 0001      RUE Paraffin   Number Minutes Paraffin  10 Minutes    RUE Paraffin Location  Hand    Comments  prior to manual therapy       LUE Paraffin   Number Minutes Paraffin  10 Minutes    LUE Paraffin Location  Hand    Comments  prior to manual therapy        Soft tissue mobs to volar forearm and palm with graston tool nr 2 brushing and sweeping  - and over volar digits  And webspace by OT and joint mobs to [facility]  Interlocking in digits for ABD - Pt ed to do at home       OT Education - 06/28/19 0832    Education provided  Yes    Education Details  progress and what to focus on HEP    Person(s) Educated  Patient    Methods  Explanation;Demonstration;Tactile cues;Verbal cues;Handout    Comprehension  Returned demonstration;Verbalized understanding       OT Short Term Goals - 06/11/19 1127      OT SHORT TERM GOAL #1   Title  Pt to be independent in HEP to use splint correctly ,  increase AROM  in   bilateral MC flexion , PIP extention ,  wrist flexion and L thumb    Baseline  no knowledge - see flowsheet for measurements - worse than March 2019    Time  4    Period  Weeks    Status  New    Target Date  07/09/19        OT Long Term Goals - 06/11/19 1129      OT LONG TERM GOAL #1   Title  AROM in bilateral hands improve with more than 10 points on function score with PRWHE    Baseline  see flowsheet for measurements for ROM - 37/50 at eval on FUnction score PRWHE    Time  6    Period  Weeks    Status  New    Target Date  07/23/19      OT LONG TERM GOAL #2   Title  Pt  have knowledge and verbalize use of more than 3 AE to increase ease of doing ADL's and IADL's    Baseline  no knowledge  -hard time open packages, use scissors, writing , driving ,  hair care, dressing , cleaning    Time  5    Period  Weeks    Status  New    Target Date  07/16/19      OT LONG TERM GOAL #3   Title  Bilateral grip strength to be at least more than 15 lbs to turn doorknob, cut food, , squeeze washcloth    Baseline  NT - very little MC flexion - and 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 - assess next time    Time  6    Period  Weeks    Status  New    Target Date  07/23/19            Plan - 06/28/19 [id number]    Clinical Impression Statement  Pt cont to make some progress in L hand MC flexion , and PIP extention - and grip strength in bilateral hands- slow but steady - encourage pt to get paraffin bath at home to cont in future with HEP at home - cont to increase AROM MC flexion and PIP extention for increase functional use    OT Occupational Profile and History  Problem Focused Assessment - Including review of records relating to presenting problem    Occupational performance deficits (Please refer to evaluation for details):  ADL's;IADL's;Work;Play;Leisure;Social Participation    Body Structure / Function / Physical Skills  ADL;Decreased knowledge of use of DME;Flexibility;ROM;UE  functional use;FMC;Dexterity;Pain;Strength;IADL    Rehab Potential  Fair    Clinical Decision Making  Several treatment options, min-mod task modification necessary    Comorbidities Affecting Occupational Performance:  May have comorbidities impacting occupational performance    Modification or Assistance to Complete Evaluation   Min-Moderate modification of tasks or assist with assess necessary to complete eval    OT Frequency  2x / week    OT Duration  4 weeks    OT Treatment/Interventions  Self-care/ADL training;Therapeutic exercise;Patient/family education;Paraffin;Manual Therapy;Passive range of motion;Splinting;Moist Heat    Plan  assess progress and tolerance doing HEP- without increasing pain -? paraffin bath for home use    OT Home Exercise Plan  see pt instruction    Consulted and Agree with Plan of Care  Patient       Patient will benefit from skilled therapeutic intervention in order to improve the following deficits and impairments:   Body Structure / Function / Physical Skills: ADL, Decreased knowledge of use of DME, Flexibility, ROM, UE functional use, FMC, Dexterity, Pain, Strength, IADL       Visit Diagnosis: Stiffness of right hand, not elsewhere classified  Stiffness of left hand, not elsewhere classified  Muscle weakness (generalized)  Pain in left hand  Pain in right hand    Problem List There are no active problems to display for this patient.   Rosalyn Gess OTR/L,CLT 06/28/2019, 11:52 AM  [place] [facility] [address] [place], [address] Phone: [phone]   Fax:  [phone]  Name: Whitney Mclean MRN: [id number] Date of Birth: Oct 08, 1965

## 2019-06-28 NOTE — Patient Instructions (Signed)
Same

## 2019-06-28 NOTE — Therapy (Signed)
[place] [facility] [address], [place], [address] Phone: [phone]   Fax:  [phone]  Physical Therapy Treatment  Patient Details  Name: Whitney Mclean MRN: [id number] Date of Birth: 1965-11-02 No data recorded  Encounter Date: 06/28/2019  PT End of Session - 06/28/19 0851    Visit Number  5    Number of Visits  17    Date for PT Re-Evaluation  08/05/19    PT Start Time  0845    PT Stop Time  0925    PT Time Calculation (min)  40 min    Activity Tolerance  Patient tolerated treatment well;No increased pain    Behavior During Therapy  WFL for tasks assessed/performed       Past Medical History:  Diagnosis Date  . Oral cancer [facility])     Past Surgical History:  Procedure Laterality Date  . ABDOMINAL HYSTERECTOMY      There were no vitals filed for this visit.  Subjective Assessment - 06/28/19 0848    Subjective  Pt just finishing her OT session. She reporta having a nice uneventful weekend. Pt denies pain this date.    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 Shoulder pain started 3 months ago, no mechanism of injury, was getting better with heat. Reports she had pain for a about a month, and for the past 2 months the shoulder has been stiff. Has associated neck stiffness since her surgery last year . 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 Has trouble washing her hair, cannot tie her shoes d/t reaching and hand dysfunction, cannot sit up and hold her head up without neck discomfort at her neck, cannot dress herself normally. No pain in shoulder/neck over the past week, describes discomfort/stiffness. 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    Currently in Pain?  No/denies        INTERVENTION THIS  DATE Flexion table slides x20, seated External Rotation slides x20, standing  Supine: -AA/ROM short level shoulder flexion 1x15 -AA/ROM short level shoulder flexion to 0 degrees 1x15  GHJ joint distraction with axillary towel  1. Lateral distraction in GHJ 1x30secH, Grade 4 Ix at 0 degrees, x at 45 degrees, 1x at 60, 1x at 90 degrees (mobilization belt used at 90 degrees)   Cervical AA/ROM  -Left Lateral flexion 15x5secH (c contrlateral shoulder stabilization) -Left Rotation 15x5secH (c contrlateral shoulder stabilization) -Cervical extension 15x3secH, A/ROM seated -Supine cervical left rotation A/ROM 1x10 -supine cervical left lateral flexon A/ROM 1x10 -seated scapular retraction 1x15x3secH      PT Short Term Goals - 06/11/19 1115      PT SHORT TERM GOAL #1   Title  Pt will be independent with HEP in order to improve strength and decrease pain in order to improve pain-free function at home and work.    Time  4    Period  Weeks    Status  New        PT Long Term Goals - 06/11/19 1106      PT LONG TERM GOAL #1   Title  Pt will decrease quick DASH score by at least 8% in order to demonstrate clinically significant reduction in disability.    Baseline  06/11/19 56.8%  Time  8    Period  Weeks    Status  New      PT LONG TERM GOAL #2   Title  Patient will demonstrate full cervical ROM in order to drive safely    Baseline  06/11/19 R/L rotation 55d/10d lateral bending 30d/15d extension 0d    Time  8    Period  Weeks    Status  New      PT LONG TERM GOAL #3   Title  Patient will demonstrate full R shoulder ROM in order to complete ADLs    Baseline  06/11/19 see eval    Time  8    Period  Weeks            Plan - 06/28/19 0851    Clinical Impression Statement  Continued to work on ROM deficits in RUE and neck as well as strength in A/ROM. Pt remains limited, but tolerates session well. ROM restrictions remain tight, blanching with stretches, but pt does report  decreaesd stiffness after session. Pt also reports improved tolerance at the hair salon after last session. Pt continues to make slow but steady progress toward goals overall.    Rehab Potential  Good    PT Frequency  2x / week    PT Duration  8 weeks    PT Treatment/Interventions  ADLs/Self Care Home Management;Aquatic Therapy;Moist Heat;Traction;Ultrasound;Cryotherapy;Parrafin;Functional mobility training;Neuromuscular re-education;Taping;Dry needling;Passive range of motion;Joint Manipulations;Spinal Manipulations;Patient/family education;Therapeutic exercise;Manual techniques;Therapeutic activities;Electrical Stimulation;Fluidtherapy;Iontophoresis 4mg /ml Dexamethasone    PT Next Visit Plan  Increase cervical ROM; postural restoration    Consulted and Agree with Plan of Care  Patient       Patient will benefit from skilled therapeutic intervention in order to improve the following deficits and impairments:  Decreased endurance, Decreased mobility, Pain, Postural dysfunction, Impaired UE functional use, Impaired flexibility, Increased fascial restricitons, Decreased strength, Decreased activity tolerance, Decreased range of motion, Decreased scar mobility, Impaired tone, Improper body mechanics, Hypomobility  Visit Diagnosis: Muscle weakness (generalized)  Stiffness of right shoulder, not elsewhere classified  Localized edema  Cervicalgia     Problem List There are no active problems to display for this patient.  9:28 AM, 06/28/19 Etta Grandchild, PT, DPT Physical Therapist - [place] [phone] (Office)    Nylan Nevel C 06/28/2019, 9:28 AM  [place] [facility] [address], [place], [address] Phone: [phone]   Fax:  [phone]  Name: Whitney Mclean MRN: [id number] Date of Birth: 1966-04-09

## 2019-07-01 ENCOUNTER — Ambulatory Visit: Payer: BC Managed Care – PPO | Admitting: Occupational Therapy

## 2019-07-01 ENCOUNTER — Ambulatory Visit: Payer: BC Managed Care – PPO

## 2019-07-01 ENCOUNTER — Other Ambulatory Visit: Payer: Self-pay

## 2019-07-01 DIAGNOSIS — M79642 Pain in left hand: Secondary | ICD-10-CM

## 2019-07-01 DIAGNOSIS — M6281 Muscle weakness (generalized): Secondary | ICD-10-CM

## 2019-07-01 DIAGNOSIS — R600 Localized edema: Secondary | ICD-10-CM

## 2019-07-01 DIAGNOSIS — M25611 Stiffness of right shoulder, not elsewhere classified: Secondary | ICD-10-CM

## 2019-07-01 DIAGNOSIS — M25641 Stiffness of right hand, not elsewhere classified: Secondary | ICD-10-CM

## 2019-07-01 DIAGNOSIS — M79641 Pain in right hand: Secondary | ICD-10-CM

## 2019-07-01 DIAGNOSIS — M25642 Stiffness of left hand, not elsewhere classified: Secondary | ICD-10-CM

## 2019-07-01 DIAGNOSIS — M542 Cervicalgia: Secondary | ICD-10-CM

## 2019-07-01 NOTE — Therapy (Signed)
[place] [facility] [address] [place], [address] Phone: [phone]   Fax:  [phone]  Physical Therapy Treatment  Patient Details  Name: XITLALIC GLASCOCK MRN: [id number] Date of Birth: [birth year] No data recorded  Encounter Date: 07/01/2019  PT End of Session - 07/01/19 0903    Visit Number  6    Number of Visits  17    Date for PT Re-Evaluation  08/05/19    PT Start Time  0900    PT Stop Time  0940    PT Time Calculation (min)  40 min    Activity Tolerance  Patient tolerated treatment well;No increased pain    Behavior During Therapy  WFL for tasks assessed/performed       Past Medical History:  Diagnosis Date  . Oral cancer [facility])     Past Surgical History:  Procedure Laterality Date  . ABDOMINAL HYSTERECTOMY      There were no vitals filed for this visit.  Subjective Assessment - 07/01/19 0902    Subjective  Pt doingh well this date, she reports still working on HEP. She reports she's making progress with hand to head with OT.    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 Shoulder pain started 3 months ago, no mechanism of injury, was getting better with heat. Reports she had pain for a about a month, and for the past 2 months the shoulder has been stiff. Has associated neck stiffness since her surgery last year . 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 Has trouble washing her hair, cannot tie her shoes d/t reaching and hand dysfunction, cannot sit up and hold her head up without neck discomfort at her neck, cannot dress herself normally. No pain in shoulder/neck over the past week, describes discomfort/stiffness. 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    Currently in Pain?  No/denies         INTERVENTION THIS DATE -Flexion table slides seated: 15x3sec, 10x10sec -External Rotation slides seated: 10x10secH (limited abduction angle to reduce pain) -Hooklying on longitudinal towel roll: pec minor/scapular retraction stretch 3x30sec  -Cervical Left rotation stretch 10x10sec -MFR to upper trap x10 minutes sustained release   Supine: -AA/ROM short level shoulder flexion 1x15 -AA/ROM short level shoulder flexion to 0 degrees 1x15  GHJ joint distraction with axillary towel  1. Lateral distraction in GHJ 1x30secH, Grade 4 Ix at 0 degrees, x at 45 degrees, 1x at 60, 1x at 90 degrees (mobilization belt used at 90 degrees)   PT Short Term Goals - 06/11/19 1115      PT SHORT TERM GOAL #1   Title  Pt will be independent with HEP in order to improve strength and decrease pain in order to improve pain-free function at home and work.    Time  4    Period  Weeks    Status  New        PT Long Term Goals - 06/11/19 1106      PT LONG TERM GOAL #1   Title  Pt will decrease quick DASH score by at least 8% in order to demonstrate clinically significant reduction in disability.    Baseline  06/11/19 56.8%    Time  8  Period  Weeks    Status  New      PT LONG TERM GOAL #2   Title  Patient will demonstrate full cervical ROM in order to drive safely    Baseline  06/11/19 R/L rotation 55d/10d lateral bending 30d/15d extension 0d    Time  8    Period  Weeks    Status  New      PT LONG TERM GOAL #3   Title  Patient will demonstrate full R shoulder ROM in order to complete ADLs    Baseline  06/11/19 see eval    Time  8    Period  Weeks            Plan - 07/01/19 [id number]    Clinical Impression Statement  Continued to work on mobility of RUE and neck and soft tissue compliance. Shifted focus this date to include more low-load-long-duration stretching. Author tries to be mindful of adjacent segments while stretching these areas.    PT Frequency  2x / week    PT Duration  8  weeks    PT Treatment/Interventions  ADLs/Self Care Home Management;Aquatic Therapy;Moist Heat;Traction;Ultrasound;Cryotherapy;Parrafin;Functional mobility training;Neuromuscular re-education;Taping;Dry needling;Passive range of motion;Joint Manipulations;Spinal Manipulations;Patient/family education;Therapeutic exercise;Manual techniques;Therapeutic activities;Electrical Stimulation;Fluidtherapy;Iontophoresis 4mg /ml Dexamethasone    Consulted and Agree with Plan of Care  Patient       Patient will benefit from skilled therapeutic intervention in order to improve the following deficits and impairments:  Decreased endurance, Decreased mobility, Pain, Postural dysfunction, Impaired UE functional use, Impaired flexibility, Increased fascial restricitons, Decreased strength, Decreased activity tolerance, Decreased range of motion, Decreased scar mobility, Impaired tone, Improper body mechanics, Hypomobility  Visit Diagnosis: Muscle weakness (generalized)  Localized edema  Stiffness of right hand, not elsewhere classified  Stiffness of left hand, not elsewhere classified  Pain in right hand  Pain in left hand  Stiffness of right shoulder, not elsewhere classified  Cervicalgia     Problem List There are no active problems to display for this patient.  9:41 AM, 07/01/19 Etta Grandchild, PT, DPT Physical Therapist - [place] [phone] (Office)    Nikole Swartzentruber C 07/01/2019, 9:10 AM  [place] [facility] [address], [place], [address] Phone: [phone]   Fax:  [phone]  Name: DEOSHA MAMMEN MRN: [id number] Date of Birth: 12/21/1965

## 2019-07-01 NOTE — Therapy (Signed)
[place] [address] [place], [address] Phone: [phone]   Fax:  [phone]  Occupational Therapy Treatment  Patient Details  Name: Whitney Mclean MRN: [id number] Date of Birth: [birth year] Referring Provider (OT): Artis Delay   Encounter Date: 07/01/2019  OT End of Session - 07/01/19 0817    Visit Number  6    Number of Visits  13    Date for OT Re-Evaluation  07/23/19    OT Start Time  0818    OT Stop Time  0900    OT Time Calculation (min)  42 min    Activity Tolerance  Patient tolerated treatment well    Behavior During Therapy  [facility] for tasks assessed/performed       Past Medical History:  Diagnosis Date  . Oral cancer [facility])     Past Surgical History:  Procedure Laterality Date  . ABDOMINAL HYSTERECTOMY      There were no vitals filed for this visit.  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  Yes    Pain Score  2     Pain Location  Hand    Pain Orientation  Right;Left    Pain Descriptors / Indicators  Aching    Pain Type  Chronic pain    Pain Onset  More than a month ago    Pain Frequency  Constant         [facility] OT Assessment - 07/01/19 0001      AROM   Right Wrist Extension  70 Degrees    Right Wrist Flexion  60 Degrees    Right Wrist Radial  Deviation  20 Degrees    Right Wrist Ulnar Deviation  35 Degrees    Left Wrist Extension  70 Degrees    Left Wrist Flexion  45 Degrees    Left Wrist Radial Deviation  24 Degrees    Left Wrist Ulnar Deviation  25 Degrees       cont to make progress  - Measurements taken - see flow sheet  Review again with pt HEP - what to focus on because of her SLP and PT exercises    discuss with pt again about importance for getting paraffin bath to use at home in conjunction with LMB and knuckle bender splints           OT Treatments/Exercises (OP) - 07/01/19 0001      RUE Paraffin   Number Minutes Paraffin  10 Minutes    RUE Paraffin Location  Hand    Comments  prior to manual and knuckle bender       LUE Paraffin   Number Minutes Paraffin  10 Minutes    LUE Paraffin Location  Hand    Comments  prior to manual and knucklebender         Soft tissue mobs to volar forearm and palm with graston tool nr 2 brushing and sweeping- and over volar digits And webspace by OT and joint mobs to [facility]  Interlocking in digits for ABD -  Pt to can do PROM on table for R PIP extention , and LMB splint on L PIP extention   FLexion PROM for [facility] - And use of knuckle bender on bilateral  Opposition to all digist  And on AROM for thumb PA and RA  Wrist AAROM for wrist flexion  And AROM for ext , RD, UD , sup /pro  10 reps   2 x day  Elbow extention over other day  And wrist 1 x day  Hands 2 x day       OT Education - 07/01/19 0946    Education provided  Yes    Education Details  progress and HEP review    Person(s) Educated  Patient    Methods  Explanation;Demonstration;Tactile cues;Verbal cues;Handout    Comprehension  Returned demonstration;Verbalized understanding       OT Short Term Goals - 07/01/19 0949      OT SHORT TERM GOAL #1   Title  Pt to be independent in HEP to use splint correctly ,  increase AROM  in  bilateral MC flexion , PIP extention ,  wrist flexion and L thumb     Status  Achieved        OT Long Term Goals - 07/01/19 1209      OT LONG TERM GOAL #1   Title  AROM in bilateral hands improve with more than 10 points on function score with PRWHE    Baseline  see flowsheet for measurements for ROM - 37/50 at eval on FUnction score PRWHE - to assess next time - progressing per pt in gripping    Time  3    Period  Weeks    Status  On-going    Target Date  07/22/19      OT LONG TERM GOAL #2   Title  Pt  have knowledge and verbalize use of more than 3 AE to increase ease of doing ADL's and IADL's    Baseline  no knowledge  -hard time open packages, use scissors, writing , driving ,  hair care, dressing , cleaning - stil need some review    Time  3    Period  Weeks    Status  On-going    Target Date  07/22/19      OT LONG TERM GOAL #3   Title  Bilateral grip strength to be at least more than 15 lbs to turn doorknob, cut food, , squeeze washcloth    Baseline  NT - very little MC flexion - and 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 - now grip 28 and 30 lbs    Status  Achieved            Plan - 07/01/19 0818    Clinical Impression Statement  Pt made some progress in wrist AROM , did get parafffin bath to use at home - reinforce importance of using paraffin daily - and work on PROM PIP extention and MC flexion bilateral hands - in combination    OT Occupational Profile and History  Problem Focused Assessment - Including review of  records relating to presenting problem    Occupational performance deficits (Please refer to evaluation for details):  ADL's;IADL's;Work;Play;Leisure;Social Participation    Body Structure / Function / Physical Skills  ADL;Decreased knowledge of use of DME;Flexibility;ROM;UE functional use;FMC;Dexterity;Pain;Strength;IADL    Rehab Potential  Fair    Clinical Decision Making  Several treatment options, min-mod task modification necessary    Comorbidities Affecting Occupational Performance:  May have comorbidities  impacting occupational performance    Modification or Assistance to Complete Evaluation   Min-Moderate modification of tasks or assist with assess necessary to complete eval    OT Frequency  1x / week    OT Duration  --   3wks   OT Treatment/Interventions  Self-care/ADL training;Therapeutic exercise;Patient/family education;Paraffin;Manual Therapy;Passive range of motion;Splinting;Moist Heat    Plan  assess progress and tolerance doing HEP- without increasing pain -? paraffin bath for home use    OT Home Exercise Plan  see pt instruction    Consulted and Agree with Plan of Care  Patient       Patient will benefit from skilled therapeutic intervention in order to improve the following deficits and impairments:   Body Structure / Function / Physical Skills: ADL, Decreased knowledge of use of DME, Flexibility, ROM, UE functional use, FMC, Dexterity, Pain, Strength, IADL       Visit Diagnosis: Muscle weakness (generalized)  Localized edema  Stiffness of right hand, not elsewhere classified  Stiffness of left hand, not elsewhere classified  Pain in right hand  Pain in left hand    Problem List There are no active problems to display for this patient.   Rosalyn Gess OTR/L,CLT 07/01/2019, 12:37 PM  [place] [facility] [address] [place], [address] Phone: [phone]   Fax:  [phone]  Name: Whitney Mclean MRN: [id number] Date of Birth: [birth year]

## 2019-07-05 ENCOUNTER — Other Ambulatory Visit: Payer: Self-pay

## 2019-07-05 ENCOUNTER — Encounter: Payer: BC Managed Care – PPO | Admitting: Occupational Therapy

## 2019-07-05 ENCOUNTER — Encounter: Payer: Self-pay | Admitting: Physical Therapy

## 2019-07-05 ENCOUNTER — Ambulatory Visit: Payer: BC Managed Care – PPO | Admitting: Physical Therapy

## 2019-07-05 DIAGNOSIS — M25611 Stiffness of right shoulder, not elsewhere classified: Secondary | ICD-10-CM

## 2019-07-05 DIAGNOSIS — M6281 Muscle weakness (generalized): Secondary | ICD-10-CM

## 2019-07-05 DIAGNOSIS — M542 Cervicalgia: Secondary | ICD-10-CM

## 2019-07-05 NOTE — Therapy (Signed)
[place] [facility] [address] [place], [address] Phone: [phone]   Fax:  [phone]  Physical Therapy Treatment  Patient Details  Name: Whitney Mclean MRN: [id number] Date of Birth: 1966-01-02 No data recorded  Encounter Date: 07/05/2019  PT End of Session - 07/05/19 0929    Visit Number  7    Number of Visits  17    Date for PT Re-Evaluation  08/05/19    PT Start Time  0900    PT Stop Time  0940    PT Time Calculation (min)  40 min    Activity Tolerance  Patient tolerated treatment well;No increased pain    Behavior During Therapy  [facility] for tasks assessed/performed       Past Medical History:  Diagnosis Date  . Oral cancer [facility])     Past Surgical History:  Procedure Laterality Date  . ABDOMINAL HYSTERECTOMY      There were no vitals filed for this visit.  Subjective Assessment - 07/05/19 0904    Subjective  Working on her HEP, minimal pain to date.    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 Shoulder pain started 3 months ago, no mechanism of injury, was getting better with heat. Reports she had pain for a about a month, and for the past 2 months the shoulder has been stiff. Has associated neck stiffness since her surgery last year . 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 Has trouble washing her hair, cannot tie her shoes d/t reaching and hand dysfunction, cannot sit up and hold her head up without neck discomfort at her neck, cannot dress herself normally. No pain in shoulder/neck over the past week, describes discomfort/stiffness. 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    Limitations  Sitting;Writing;House hold activities;Lifting    How long can you sit comfortably?  30    How long can you stand  comfortably?  unlimited    How long can you walk comfortably?  unlimited    Diagnostic tests  None on shoulder    Patient Stated Goals  Increase shoulder strength and mobility          Manual PROM shoulder in all directions into pain free/cusp of pain ROM 5-10sec holds G3 post GHJ mobs 30sec boutas 4 bouts for decreased pain > increase ROM, with motion into flex/ER/IR 6 bouts GHJ distraction10sec distraction 10sec relax x10  Passive cervical rotation and lateral bending to L , and ext off edge of mat table with support MFR to R UT, SCM   Ther-Ex - Prone Ts 3x 10 with TC for proper positioning maintaining available 90d of motion - AAROM cervical ext and L rotation with PT manual assistance x20 each 3sec hold - Supine upper thoracic ext over 1/2 foam roll with cervical ext, holding pipe overhead with combined bilat shoulder flex (wide grip for chest stretch) 2 x10 with 5sec hold - Seated shoulder flex towel slides 2x 15 with 3 sec hold - UE ranger x10 clockwise/counterclockwise with PT assist with overhead reaching occasionally                         PT Short Term Goals - 06/11/19 1115  PT SHORT TERM GOAL #1   Title  Pt will be independent with HEP in order to improve strength and decrease pain in order to improve pain-free function at home and work.    Time  4    Period  Weeks    Status  New        PT Long Term Goals - 06/11/19 1106      PT LONG TERM GOAL #1   Title  Pt will decrease quick DASH score by at least 8% in order to demonstrate clinically significant reduction in disability.    Baseline  06/11/19 56.8%    Time  8    Period  Weeks    Status  New      PT LONG TERM GOAL #2   Title  Patient will demonstrate full cervical ROM in order to drive safely    Baseline  06/11/19 R/L rotation 55d/10d lateral bending 30d/15d extension 0d    Time  8    Period  Weeks    Status  New      PT LONG TERM GOAL #3   Title  Patient will demonstrate  full R shoulder ROM in order to complete ADLs    Baseline  06/11/19 see eval    Time  8    Period  Weeks            Plan - 07/05/19 0935    Clinical Impression Statement  Continued long holds with MFR and stretches with good increased mobility and favorable to patient. Patient is having some improvements in motions and subjectively reports she is less fatigued with her computer time, which is an improvement. PT will continue progression as able.    Personal Factors and Comorbidities  Age;Behavior Pattern;Comorbidity 1;Comorbidity 2;Past/Current Experience;Fitness;Sex;Time since onset of injury/illness/exacerbation    Comorbidities  scleroderma, cancer    Examination-Activity Limitations  Reach Overhead;Self Feeding;Dressing;Sleep;Lift    Examination-Participation Restrictions  Laundry;Cleaning;Community Activity;Driving    Stability/Clinical Decision Making  Evolving/Moderate complexity    Clinical Decision Making  Moderate    Rehab Potential  Good    PT Frequency  2x / week    PT Duration  8 weeks    PT Treatment/Interventions  ADLs/Self Care Home Management;Aquatic Therapy;Moist Heat;Traction;Ultrasound;Cryotherapy;Parrafin;Functional mobility training;Neuromuscular re-education;Taping;Dry needling;Passive range of motion;Joint Manipulations;Spinal Manipulations;Patient/family education;Therapeutic exercise;Manual techniques;Therapeutic activities;Electrical Stimulation;Fluidtherapy;Iontophoresis 4mg /ml Dexamethasone    PT Next Visit Plan  Increase cervical ROM; postural restoration    Consulted and Agree with Plan of Care  Patient       Patient will benefit from skilled therapeutic intervention in order to improve the following deficits and impairments:  Decreased endurance, Decreased mobility, Pain, Postural dysfunction, Impaired UE functional use, Impaired flexibility, Increased fascial restricitons, Decreased strength, Decreased activity tolerance, Decreased range of motion,  Decreased scar mobility, Impaired tone, Improper body mechanics, Hypomobility  Visit Diagnosis: Stiffness of right shoulder, not elsewhere classified  Cervicalgia  Muscle weakness (generalized)     Problem List There are no active problems to display for this patient.  Shelton Silvas PT, DPT Shelton Silvas 07/05/2019, 9:55 AM  [place] [facility] [address], [place], [address] Phone: [phone]   Fax:  [phone]  Name: MARTISHA PAONESSA MRN: [id number] Date of Birth: 09/28/1965

## 2019-07-08 ENCOUNTER — Encounter: Payer: Self-pay | Admitting: Physical Therapy

## 2019-07-08 ENCOUNTER — Ambulatory Visit: Payer: BC Managed Care – PPO | Admitting: Physical Therapy

## 2019-07-08 ENCOUNTER — Other Ambulatory Visit: Payer: Self-pay

## 2019-07-08 ENCOUNTER — Ambulatory Visit: Payer: BC Managed Care – PPO | Admitting: Occupational Therapy

## 2019-07-08 DIAGNOSIS — M79642 Pain in left hand: Secondary | ICD-10-CM

## 2019-07-08 DIAGNOSIS — M25642 Stiffness of left hand, not elsewhere classified: Secondary | ICD-10-CM

## 2019-07-08 DIAGNOSIS — M6281 Muscle weakness (generalized): Secondary | ICD-10-CM

## 2019-07-08 DIAGNOSIS — M79641 Pain in right hand: Secondary | ICD-10-CM

## 2019-07-08 DIAGNOSIS — M25611 Stiffness of right shoulder, not elsewhere classified: Secondary | ICD-10-CM

## 2019-07-08 DIAGNOSIS — M25641 Stiffness of right hand, not elsewhere classified: Secondary | ICD-10-CM

## 2019-07-08 DIAGNOSIS — M542 Cervicalgia: Secondary | ICD-10-CM

## 2019-07-08 NOTE — Therapy (Signed)
[place] [facility] [address], [place], [address] Phone: [phone]   Fax:  [phone]  Occupational Therapy Treatment  Patient Details  Name: Whitney Mclean MRN: [id number] Date of Birth: 1966-07-27 Referring Provider (OT): Artis Delay   Encounter Date: 07/08/2019  OT End of Session - 07/08/19 0911    Visit Number  7    Number of Visits  13    Date for OT Re-Evaluation  07/23/19    OT Start Time  0817    OT Stop Time  0900    OT Time Calculation (min)  43 min    Activity Tolerance  Patient tolerated treatment well    Behavior During Therapy  [facility] for tasks assessed/performed       Past Medical History:  Diagnosis Date  . Oral cancer [facility])     Past Surgical History:  Procedure Laterality Date  . ABDOMINAL HYSTERECTOMY      There were no vitals filed for this visit.  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  No/denies         [facility] OT Assessment - 07/08/19 0001      AROM   Right Wrist Extension  70 Degrees    Right Wrist Flexion  65 Degrees    Right Wrist Radial Deviation  28 Degrees    Right Wrist Ulnar Deviation  35 Degrees    Left Wrist Extension  70 Degrees    Left Wrist Flexion  50 Degrees    Left Wrist Radial Deviation  21 Degrees    Left Wrist Ulnar Deviation  30 Degrees      Strength   Right Hand Grip (lbs)  30    Right Hand Lateral Pinch  10 lbs    Right Hand 3 Point Pinch  10 lbs    Left Hand Grip (lbs)  28    Left Hand Lateral Pinch  9 lbs    Left Hand 3 Point Pinch  8 lbs      Right Hand AROM   R Index  MCP 0-90  75 Degrees    R Index PIP 0-100  85 Degrees   -30   R Long  MCP 0-90  82 Degrees    R Long PIP 0-100  100 Degrees   -40   R Ring  MCP 0-90  85 Degrees    R Ring PIP 0-100  95 Degrees   -45   R Little  MCP 0-90  82 Degrees    R Little PIP 0-100  90 Degrees   -45     Left Hand AROM   L Index  MCP 0-90  62  Degrees    L Index PIP 0-100  --   -60   L Long  MCP 0-90  60 Degrees    L Long PIP 0-100  --   -60   L Ring  MCP 0-90  35 Degrees    L Ring PIP 0-100  --   -72   L Little  MCP 0-90  32 Degrees    L Little PIP 0-100  --   -62     Measurements taken and compare to week ago - pt doing HEP at home using paraffin bath , splints for MC flexion and PIP extention  Report doing at night time - and feels like pain at night little better and less pain          OT Treatments/Exercises (OP) - 07/08/19 0001      RUE Paraffin   Number Minutes Paraffin  8 Minutes    RUE Paraffin Location  Hand    Comments  prior to manual and PROM       LUE Paraffin   Number Minutes Paraffin  8 Minutes    Comments  prior to manual and MC flexion stretch by OT        Soft tissue mobs to volar forearm and palm with graston tool nr 2 brushing and sweeping- and over volar digits And webspace by OT and joint mobs to [facility]    Done  PROM on table for R PIP extention , and LMB splint on L PIP extention  And manual by OT   PROM flexion for MC   Add this date teal putty for gripping at end of session R and L hand but focus on flexion at [facility] -and 12-15 reps  And end with rolling putty for extention of PIP's      OT Education - 07/08/19 0911    Education provided  Yes    Education Details  progress  and HEP review    Person(s) Educated  Patient    Methods  Explanation;Demonstration;Tactile cues;Verbal cues;Handout    Comprehension  Returned demonstration;Verbalized understanding       OT Short Term Goals - 07/01/19 0949      OT SHORT TERM GOAL #1   Title  Pt to be independent in HEP to use splint correctly ,  increase AROM  in  bilateral MC flexion , PIP extention ,  wrist flexion and L thumb    Status  Achieved        OT Long Term Goals - 07/01/19 1209      OT LONG TERM GOAL #1   Title  AROM in bilateral hands improve with more than 10 points on function score with PRWHE    Baseline  see flowsheet for measurements for ROM - 37/50 at eval on FUnction score PRWHE - to assess next time - progressing per pt in gripping    Time  3    Period  Weeks    Status  On-going    Target Date  07/22/19      OT LONG TERM GOAL #2   Title  Pt  have knowledge and verbalize use of more than 3 AE to increase ease of doing ADL's and IADL's    Baseline  no knowledge  -hard time open packages, use scissors, writing , driving ,  hair care, dressing , cleaning - stil need some review    Time  3    Period  Weeks    Status  On-going  Target Date  07/22/19      OT LONG TERM GOAL #3   Title  Bilateral grip strength to be at least more than 15 lbs to turn doorknob, cut food, , squeeze washcloth    Baseline  NT - very little MC flexion - and 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 - now grip 28 and 30 lbs    Status  Achieved            Plan - 07/08/19 0912    Clinical Impression Statement  Pt done her HEP the last week on her own and showed some progress with use of paraffin , knucklebender and LMB splints - grip was same but prehension strength increase - did add putty for gripping this date - but pt to end with rolling to prevent worsening of PIP flexion contractures    OT Occupational Profile and History  Problem Focused Assessment - Including review of records relating to presenting  problem    Occupational performance deficits (Please refer to evaluation for details):  ADL's;IADL's;Work;Play;Leisure;Social Participation    Body Structure / Function / Physical Skills  ADL;Decreased knowledge of use of DME;Flexibility;ROM;UE functional use;FMC;Dexterity;Pain;Strength;IADL    Rehab Potential  Fair    Clinical Decision Making  Several treatment options, min-mod task modification necessary    Comorbidities Affecting Occupational Performance:  May have comorbidities impacting occupational performance    Modification or Assistance to Complete Evaluation   Min-Moderate modification of tasks or assist with assess necessary to complete eval    OT Frequency  Biweekly    OT Duration  2 weeks    OT Treatment/Interventions  Self-care/ADL training;Therapeutic exercise;Patient/family education;Paraffin;Manual Therapy;Passive range of motion;Splinting;Moist Heat    Plan  assess progress and tolerance doing HEP- without increasing pain -? paraffin bath for home use and putty    OT Home Exercise Plan  see pt instruction    Consulted and Agree with Plan of Care  Patient       Patient will benefit from skilled therapeutic intervention in order to improve the following deficits and impairments:   Body Structure / Function / Physical Skills: ADL, Decreased knowledge of use of DME, Flexibility, ROM, UE functional use, FMC, Dexterity, Pain, Strength, IADL       Visit Diagnosis: Muscle weakness (generalized)  Stiffness of right hand, not elsewhere classified  Stiffness of left hand, not elsewhere classified  Pain in left hand  Pain in right hand    Problem List There are no active problems to display for this patient.   Rosalyn Gess OTR/L,CLT 07/08/2019, 9:15 AM  [place] [facility] [address] [place], [address] Phone: [phone]   Fax:  [phone]  Name: Whitney Mclean MRN: [id number] Date of  Birth: 1966-07-28

## 2019-07-08 NOTE — Patient Instructions (Signed)
Cont with same HEP -but teal putty for gripping bilateral hands - but roll afterwards for PIP extention  12-15 reps

## 2019-07-08 NOTE — Therapy (Signed)
[facility] [address] [place], [address] Phone: [phone]   Fax:  [phone]  Physical Therapy Treatment  Patient Details  Name: Whitney Mclean MRN: [id number] Date of Birth: [birth year] No data recorded  Encounter Date: 07/08/2019  PT End of Session - 07/08/19 0931    Visit Number  8    Number of Visits  17    Date for PT Re-Evaluation  08/05/19    PT Start Time  0900    PT Stop Time  0945    PT Time Calculation (min)  45 min    Activity Tolerance  Patient tolerated treatment well;No increased pain    Behavior During Therapy  WFL for tasks assessed/performed       Past Medical History:  Diagnosis Date  . Oral cancer [facility])     Past Surgical History:  Procedure Laterality Date  . ABDOMINAL HYSTERECTOMY      There were no vitals filed for this visit.  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 Shoulder pain started 3 months ago, no mechanism of injury, was getting better with heat. Reports she had pain for a about a month, and for the past 2 months the shoulder has been stiff. Has associated neck stiffness since her surgery last year . 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 Has trouble washing her hair, cannot tie her shoes d/t reaching and hand dysfunction, cannot sit up and hold her head up without neck discomfort at her neck, cannot dress herself normally. No pain in shoulder/neck over the past week, describes discomfort/stiffness. 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    Limitations  Sitting;Writing;House hold activities;Lifting    How long can you sit comfortably?  30    How long  can you stand comfortably?  unlimited    How long can you walk comfortably?  unlimited    Diagnostic tests  None on shoulder    Patient Stated Goals  Increase shoulder strength and mobility    Pain Onset  More than a month ago        Manual PROM shoulder in all directions into pain free/cusp of pain ROM 5-10sec holds G3 post GHJ mobs 30sec boutas 4 bouts for decreased pain > increase ROM, with motion into flex/ER/IR 6 bouts GHJ distraction10sec distraction 10sec relax x10  Passive cervical rotation and lateral bending to L , and ext off edge of mat table with supportsustained holds MFR to R UT, SCM   Ther-Ex - Prone Ts 2x 10 with double rolled small towel under forehead to promote cervical ext -Supine upper thoracic ext over 1/2 foam roll with cervical ext, holding pipe overhead with combined bilat shoulder flex (wide grip for chest stretch)2 x12 with 5sec hold -Seated shoulder flex towel slides 2x 15 with 3 sec hold - Elbow ext 5# 2x 10 with demo and cuing needed for proper                         PT Education - 07/08/19 0929    Education Details  therex form; ergonomics  Person(s) Educated  Patient    Methods  Explanation;Demonstration;Tactile cues;Verbal cues    Comprehension  Verbalized understanding;Returned demonstration;Verbal cues required;Tactile cues required       PT Short Term Goals - 06/11/19 1115      PT SHORT TERM GOAL #1   Title  Pt will be independent with HEP in order to improve strength and decrease pain in order to improve pain-free function at home and work.    Time  4    Period  Weeks    Status  New        PT Long Term Goals - 06/11/19 1106      PT LONG TERM GOAL #1   Title  Pt will decrease quick DASH score by at least 8% in order to demonstrate clinically significant reduction in disability.    Baseline  06/11/19 56.8%    Time  8    Period  Weeks    Status  New      PT LONG TERM GOAL #2   Title  Patient will  demonstrate full cervical ROM in order to drive safely    Baseline  06/11/19 R/L rotation 55d/10d lateral bending 30d/15d extension 0d    Time  8    Period  Weeks    Status  New      PT LONG TERM GOAL #3   Title  Patient will demonstrate full R shoulder ROM in order to complete ADLs    Baseline  06/11/19 see eval    Time  8    Period  Weeks            Plan - 07/08/19 1010    Clinical Impression Statement  Patient in continuing slow, steady progression to increased motion. Pleased with being able to lay flat without pillows in prone comfortably with neutral ext. Pt tolerates all manual techniques well with increased motion following. Is able to complete all therex with correct form following some cuing well. PT will continue progression as able.    Personal Factors and Comorbidities  Age;Behavior Pattern;Comorbidity 1;Comorbidity 2;Past/Current Experience;Fitness;Sex;Time since onset of injury/illness/exacerbation    Comorbidities  scleroderma, cancer    Examination-Activity Limitations  Reach Overhead;Self Feeding;Dressing;Sleep;Lift    Examination-Participation Restrictions  Laundry;Cleaning;Community Activity;Driving    Stability/Clinical Decision Making  Evolving/Moderate complexity    Clinical Decision Making  Moderate    Rehab Potential  Good    PT Frequency  2x / week    PT Duration  8 weeks    PT Treatment/Interventions  ADLs/Self Care Home Management;Aquatic Therapy;Moist Heat;Traction;Ultrasound;Cryotherapy;Parrafin;Functional mobility training;Neuromuscular re-education;Taping;Dry needling;Passive range of motion;Joint Manipulations;Spinal Manipulations;Patient/family education;Therapeutic exercise;Manual techniques;Therapeutic activities;Electrical Stimulation;Fluidtherapy;Iontophoresis 4mg /ml Dexamethasone    PT Next Visit Plan  Increase cervical ROM; postural restoration    Consulted and Agree with Plan of Care  Patient       Patient will benefit from skilled  therapeutic intervention in order to improve the following deficits and impairments:  Decreased endurance, Decreased mobility, Pain, Postural dysfunction, Impaired UE functional use, Impaired flexibility, Increased fascial restricitons, Decreased strength, Decreased activity tolerance, Decreased range of motion, Decreased scar mobility, Impaired tone, Improper body mechanics, Hypomobility  Visit Diagnosis: Stiffness of right shoulder, not elsewhere classified  Muscle weakness (generalized)  Cervicalgia     Problem List There are no active problems to display for this patient.  Shelton Silvas PT, DPT Shelton Silvas 07/08/2019, 10:14 AM  [facility] [address], [place], [address] Phone: [phone]  Fax:  [phone]  Name: MAX WAREN MRN: [id number] Date of Birth: June 24, 1966

## 2019-07-12 ENCOUNTER — Other Ambulatory Visit: Payer: Self-pay

## 2019-07-12 ENCOUNTER — Ambulatory Visit: Payer: BC Managed Care – PPO | Admitting: Physical Therapy

## 2019-07-12 ENCOUNTER — Encounter: Payer: Self-pay | Admitting: Physical Therapy

## 2019-07-12 DIAGNOSIS — M6281 Muscle weakness (generalized): Secondary | ICD-10-CM

## 2019-07-12 DIAGNOSIS — M25611 Stiffness of right shoulder, not elsewhere classified: Secondary | ICD-10-CM

## 2019-07-12 DIAGNOSIS — M25641 Stiffness of right hand, not elsewhere classified: Secondary | ICD-10-CM

## 2019-07-12 DIAGNOSIS — M542 Cervicalgia: Secondary | ICD-10-CM

## 2019-07-12 NOTE — Therapy (Signed)
[facility] [address], [place], [address] Phone: [phone]   Fax:  [phone]  Physical Therapy Treatment  Patient Details  Name: Whitney Mclean MRN: [id number] Date of Birth: [birth year] No data recorded  Encounter Date: 07/12/2019  PT End of Session - 07/12/19 1322    Visit Number  9    Number of Visits  17    Date for PT Re-Evaluation  08/05/19    PT Start Time  0100    PT Stop Time  0140    PT Time Calculation (min)  40 min    Activity Tolerance  Patient tolerated treatment well;No increased pain       Past Medical History:  Diagnosis Date  . Oral cancer [facility])     Past Surgical History:  Procedure Laterality Date  . ABDOMINAL HYSTERECTOMY      There were no vitals filed for this visit.  Subjective Assessment - 07/12/19 1253    Subjective  Pt reports minimal pain through the weekend, reports HEP is going well. Is going to ask work about standing desk.    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 Shoulder pain started 3 months ago, no mechanism of injury, was getting better with heat. Reports she had pain for a about a month, and for the past 2 months the shoulder has been stiff. Has associated neck stiffness since her surgery last year . 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 Has trouble washing her hair, cannot tie her shoes d/t reaching and hand dysfunction, cannot sit up and hold her head up without neck discomfort at her neck, cannot dress herself normally. No pain in shoulder/neck over the past week, describes discomfort/stiffness. 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    Limitations  Sitting;Writing;House hold activities;Lifting    How long can you sit comfortably?  30    How long can you  stand comfortably?  unlimited    How long can you walk comfortably?  unlimited    Diagnostic tests  None on shoulder    Patient Stated Goals  Increase shoulder strength and mobility    Pain Onset  More than a month ago       Manual PROM shoulder in all directions into pain free/cusp of pain ROM 5-10sec holds G3 post GHJ mobs 30sec boutas 4 bouts for decreased pain > increase ROM, with motion into flex/ER/IR 6 bouts GHJ distraction10sec distraction 10sec relax x10  Passive cervical rotation and lateral bending to L , and ext off edge of mat table with supportsustained holds MFR to R UT, SCM  Ther-Ex - AAROM L lateral bending and L rotation x10 with PT assisting with mobilization  - Supine cervical lift in neutral x10; 2x 5 with 5 sec hold - Seated cervical retraction with ext 2x 10 with decent success, able to obtain netural  - Supine bilat shoulder flex with yellow theraball with simultaneous thoracic ext over half foam with cervical retraction + ext; cuing for hip ext in position - Pulleys flex x18 abd x18 with 3-5sec hold in end range, cuing to "feel back of head on door" for posture cuing  PT Education - 07/12/19 1321    Education Details  therex form    Person(s) Educated  Patient    Methods  Explanation;Demonstration;Verbal cues    Comprehension  Verbalized understanding;Returned demonstration;Verbal cues required       PT Short Term Goals - 06/11/19 1115      PT SHORT TERM GOAL #1   Title  Pt will be independent with HEP in order to improve strength and decrease pain in order to improve pain-free function at home and work.    Time  4    Period  Weeks    Status  New        PT Long Term Goals - 06/11/19 1106      PT LONG TERM GOAL #1   Title  Pt will decrease quick DASH score by at least 8% in order to demonstrate clinically significant reduction in disability.    Baseline  06/11/19 56.8%    Time  8    Period   Weeks    Status  New      PT LONG TERM GOAL #2   Title  Patient will demonstrate full cervical ROM in order to drive safely    Baseline  06/11/19 R/L rotation 55d/10d lateral bending 30d/15d extension 0d    Time  8    Period  Weeks    Status  New      PT LONG TERM GOAL #3   Title  Patient will demonstrate full R shoulder ROM in order to complete ADLs    Baseline  06/11/19 see eval    Time  8    Period  Weeks            Plan - 07/12/19 1343    Clinical Impression Statement  Patinet with some increased tension in her R shoulder today, reports she has not been completing pulleys with HEP as often as prescribed. Is able to demonstrate AAROM of approx 20d L cervical rotation and lateral side bending, which is an improvement. PT continued progression for compound mobility with overhead motion, cervical ext, and thoracic ext with good success, muscle fatigue, and no increased pain. PT will continue progression as able.    Personal Factors and Comorbidities  Age;Behavior Pattern;Comorbidity 1;Comorbidity 2;Past/Current Experience;Fitness;Sex;Time since onset of injury/illness/exacerbation    Comorbidities  scleroderma, cancer    Examination-Activity Limitations  Reach Overhead;Self Feeding;Dressing;Sleep;Lift    Examination-Participation Restrictions  Laundry;Cleaning;Community Activity;Driving    Stability/Clinical Decision Making  Evolving/Moderate complexity    Clinical Decision Making  Moderate    Rehab Potential  Good    PT Frequency  2x / week    PT Duration  8 weeks    PT Treatment/Interventions  ADLs/Self Care Home Management;Aquatic Therapy;Moist Heat;Traction;Ultrasound;Cryotherapy;Parrafin;Functional mobility training;Neuromuscular re-education;Taping;Dry needling;Passive range of motion;Joint Manipulations;Spinal Manipulations;Patient/family education;Therapeutic exercise;Manual techniques;Therapeutic activities;Electrical Stimulation;Fluidtherapy;Iontophoresis 4mg /ml  Dexamethasone    PT Next Visit Plan  Increase cervical ROM; postural restoration    Consulted and Agree with Plan of Care  Patient       Patient will benefit from skilled therapeutic intervention in order to improve the following deficits and impairments:  Decreased endurance, Decreased mobility, Pain, Postural dysfunction, Impaired UE functional use, Impaired flexibility, Increased fascial restricitons, Decreased strength, Decreased activity tolerance, Decreased range of motion, Decreased scar mobility, Impaired tone, Improper body mechanics, Hypomobility  Visit Diagnosis: Muscle weakness (generalized)  Cervicalgia  Stiffness of right shoulder, not elsewhere classified  Stiffness of right hand, not elsewhere classified     Problem List There are  no active problems to display for this patient.  Shelton Silvas PT, DPT Shelton Silvas 07/12/2019, 1:46 PM  [facility] [address] [place], [address] Phone: [phone]   Fax:  [phone]  Name: Whitney Mclean MRN: [id number] Date of Birth: March 18, 1966

## 2019-07-14 ENCOUNTER — Ambulatory Visit: Payer: BC Managed Care – PPO | Admitting: Physical Therapy

## 2019-07-14 ENCOUNTER — Encounter: Payer: Self-pay | Admitting: Physical Therapy

## 2019-07-14 ENCOUNTER — Other Ambulatory Visit: Payer: Self-pay

## 2019-07-14 DIAGNOSIS — M542 Cervicalgia: Secondary | ICD-10-CM

## 2019-07-14 DIAGNOSIS — M6281 Muscle weakness (generalized): Secondary | ICD-10-CM | POA: Diagnosis not present

## 2019-07-14 DIAGNOSIS — M25611 Stiffness of right shoulder, not elsewhere classified: Secondary | ICD-10-CM

## 2019-07-14 NOTE — Therapy (Signed)
[facility] [address] [place], [address] Phone: [phone]   Fax:  [phone]  Physical Therapy Treatment  Patient Details  Name: Whitney Mclean MRN: [id number] Date of Birth: [birth year] No data recorded  Encounter Date: 07/14/2019  PT End of Session - 07/14/19 1013    Visit Number  10    Number of Visits  17    Date for PT Re-Evaluation  08/05/19    PT Start Time  0945    PT Stop Time  1030    PT Time Calculation (min)  45 min    Activity Tolerance  Patient tolerated treatment well;No increased pain    Behavior During Therapy  WFL for tasks assessed/performed       Past Medical History:  Diagnosis Date  . Oral cancer [facility])     Past Surgical History:  Procedure Laterality Date  . ABDOMINAL HYSTERECTOMY      There were no vitals filed for this visit.  Subjective Assessment - 07/14/19 0949    Subjective  Minimal pain, feels like motion is getting better, does not have to take as many breaks at her computer.    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 Shoulder pain started 3 months ago, no mechanism of injury, was getting better with heat. Reports she had pain for a about a month, and for the past 2 months the shoulder has been stiff. Has associated neck stiffness since her surgery last year . 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 Has trouble washing her hair, cannot tie her shoes d/t reaching and hand dysfunction, cannot sit up and hold her head up without neck discomfort at her neck, cannot dress herself normally. No pain in shoulder/neck over the past week, describes discomfort/stiffness. 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    Limitations  Sitting;Writing;House hold activities;Lifting    How  long can you sit comfortably?  30    How long can you stand comfortably?  unlimited    How long can you walk comfortably?  unlimited    Diagnostic tests  None on shoulder    Patient Stated Goals  Increase shoulder strength and mobility    Pain Onset  More than a month ago       Manual PROM shoulder in all directions into pain free/cusp of pain ROM 5-10sec holds GHJ distraction10sec distraction 10sec relax x10  Passive cervical rotation and lateral bending to L , and ext off edge of mat table with supportsustained holds (able to get upper cervical spine off mat table MFR to R UT, SCM, cervical flexors, and pec minor  Ther-Ex - AAROM L lateral bending and L rotation x10 with PT assisting with mobilization   - Supine bilat shoulder flex with wand (wide grip) with simultaneous thoracic ext over half foam with cervical retraction + ext; 2x 12 wih cuing for set up and good carry over - Doorway pec stretch 2x 30sec holds - Pulleys flex x15 abd x15 with 3-5sec hold in end range, cuing to "feel back of head on door" for posture cuing - standing rows 5# 2x 10 with good carry over of neutral posture following cuing (in available range) La pull down 10# 2x 10 with good carry  over of proper technique following cuing, good stretch at end range                        PT Education - 07/14/19 0950    Education Details  therex form    Person(s) Educated  Patient    Methods  Explanation;Demonstration;Verbal cues    Comprehension  Verbalized understanding;Returned demonstration;Verbal cues required       PT Short Term Goals - 06/11/19 1115      PT SHORT TERM GOAL #1   Title  Pt will be independent with HEP in order to improve strength and decrease pain in order to improve pain-free function at home and work.    Time  4    Period  Weeks    Status  New        PT Long Term Goals - 06/11/19 1106      PT LONG TERM GOAL #1   Title  Pt will decrease quick DASH score by at  least 8% in order to demonstrate clinically significant reduction in disability.    Baseline  06/11/19 56.8%    Time  8    Period  Weeks    Status  New      PT LONG TERM GOAL #2   Title  Patient will demonstrate full cervical ROM in order to drive safely    Baseline  06/11/19 R/L rotation 55d/10d lateral bending 30d/15d extension 0d    Time  8    Period  Weeks    Status  New      PT LONG TERM GOAL #3   Title  Patient will demonstrate full R shoulder ROM in order to complete ADLs    Baseline  06/11/19 see eval    Time  8    Period  Weeks            Plan - 07/14/19 1015    Clinical Impression Statement  PT continued manual techniques for decreased muscle tension and increased cervical and shoulder ROM with good success. Patient is improving ROM slightly and ability to maintain her availible neural better through therex. PT continued therex progression for increased motion and reciprocol inhibition with patient able to comply with all cuing for proper technique. PT will continue progression as able.    Personal Factors and Comorbidities  Age;Behavior Pattern;Comorbidity 1;Comorbidity 2;Past/Current Experience;Fitness;Sex;Time since onset of injury/illness/exacerbation    Comorbidities  scleroderma, cancer    Examination-Activity Limitations  Reach Overhead;Self Feeding;Dressing;Sleep;Lift    Examination-Participation Restrictions  Laundry;Cleaning;Community Activity;Driving    Stability/Clinical Decision Making  Evolving/Moderate complexity    Clinical Decision Making  Moderate    Rehab Potential  Good    PT Frequency  2x / week    PT Treatment/Interventions  ADLs/Self Care Home Management;Aquatic Therapy;Moist Heat;Traction;Ultrasound;Cryotherapy;Parrafin;Functional mobility training;Neuromuscular re-education;Taping;Dry needling;Passive range of motion;Joint Manipulations;Spinal Manipulations;Patient/family education;Therapeutic exercise;Manual techniques;Therapeutic  activities;Electrical Stimulation;Fluidtherapy;Iontophoresis 4mg /ml Dexamethasone    PT Next Visit Plan  Increase cervical ROM; postural restoration    Consulted and Agree with Plan of Care  Patient       Patient will benefit from skilled therapeutic intervention in order to improve the following deficits and impairments:  Decreased endurance, Decreased mobility, Pain, Postural dysfunction, Impaired UE functional use, Impaired flexibility, Increased fascial restricitons, Decreased strength, Decreased activity tolerance, Decreased range of motion, Decreased scar mobility, Impaired tone, Improper body mechanics, Hypomobility  Visit Diagnosis: Muscle weakness (generalized)  Cervicalgia  Stiffness of right shoulder, not elsewhere classified  Problem List There are no active problems to display for this patient.  Shelton Silvas PT, DPT Shelton Silvas 07/14/2019, 10:30 AM  [facility] [address], [place], [address] Phone: [phone]   Fax:  [phone]  Name: HALEIGHA PICARDO MRN: [id number] Date of Birth: Jun 28, 1966

## 2019-07-22 ENCOUNTER — Other Ambulatory Visit: Payer: Self-pay

## 2019-07-22 ENCOUNTER — Ambulatory Visit: Payer: BC Managed Care – PPO | Admitting: Physical Therapy

## 2019-07-22 ENCOUNTER — Ambulatory Visit: Payer: BC Managed Care – PPO | Admitting: Occupational Therapy

## 2019-07-22 DIAGNOSIS — M542 Cervicalgia: Secondary | ICD-10-CM

## 2019-07-22 DIAGNOSIS — M25641 Stiffness of right hand, not elsewhere classified: Secondary | ICD-10-CM

## 2019-07-22 DIAGNOSIS — M25611 Stiffness of right shoulder, not elsewhere classified: Secondary | ICD-10-CM

## 2019-07-22 DIAGNOSIS — M6281 Muscle weakness (generalized): Secondary | ICD-10-CM

## 2019-07-22 DIAGNOSIS — M79642 Pain in left hand: Secondary | ICD-10-CM

## 2019-07-22 DIAGNOSIS — M25642 Stiffness of left hand, not elsewhere classified: Secondary | ICD-10-CM

## 2019-07-22 DIAGNOSIS — M79641 Pain in right hand: Secondary | ICD-10-CM

## 2019-07-22 NOTE — Patient Instructions (Signed)
Same  She can use LMB PIP extention splints on all digits And knuckle bender for L MC flexion to do 2 x day on weekends  And putty increase to mix of med and med firm - 2 x 15 reps  With rolling in between

## 2019-07-22 NOTE — Therapy (Signed)
[facility] [address] [place], [address] Phone: [phone]   Fax:  [phone]  Physical Therapy Treatment  Patient Details  Name: Whitney Mclean MRN: [id number] Date of Birth: [birth year] No data recorded  Encounter Date: 07/22/2019  PT End of Session - 07/22/19 1421    Visit Number  11    Number of Visits  17    Date for PT Re-Evaluation  08/05/19    PT Start Time  [id number]    PT Stop Time  1345    PT Time Calculation (min)  38 min    Activity Tolerance  Patient tolerated treatment well;No increased pain    Behavior During Therapy  WFL for tasks assessed/performed       Past Medical History:  Diagnosis Date  . Oral cancer [facility])     Past Surgical History:  Procedure Laterality Date  . ABDOMINAL HYSTERECTOMY      There were no vitals filed for this visit.  Subjective Assessment - 07/22/19 1421    Subjective  Patient report no pain upon arrival. She states she felt good following last treatment session and has been trying to keep up with her HEP. She state she has a lot of exercises from SLP, OT, and PT and has trouble doing all of them at the rate requested. Reports she has not been doing the pulley's as much as she should.    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 Shoulder pain started 3 months ago, no mechanism of injury, was getting better with heat. Reports she had pain for a about a month, and for the past 2 months the shoulder has been stiff. Has associated neck stiffness since her surgery last year . 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 Has trouble washing her hair, cannot tie her shoes d/t reaching and hand dysfunction, cannot sit up and hold her head up without neck discomfort at her neck, cannot dress  herself normally. No pain in shoulder/neck over the past week, describes discomfort/stiffness. 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    Limitations  Sitting;Writing;House hold activities;Lifting    How long can you sit comfortably?  30    How long can you stand comfortably?  unlimited    How long can you walk comfortably?  unlimited    Diagnostic tests  None on shoulder    Patient Stated Goals  Increase shoulder strength and mobility    Currently in Pain?  No/denies    Pain Onset  More than a month ago       Manual therapy: to reduce pain and tissue tension, improve range of motion, neuromodulation, in order to promote improved ability to complete functional activities. - PROM shoulder in all directions into pain free/cusp of pain ROM 5-10sec holds - R GHJ distraction10sec distraction 10sec relax x10  - R GHJ caudle glide grade III-IV, x7 - Passive cervical rotation and lateral bending to L , and ext off edge of mat table with supportsustained holds (able to get upper cervical spine off mat table) 10x 3 second holds - MFR to R UT, SCM, cervical flexors, and pec minor  Therapeutic exercise: to centralize symptoms and improve ROM, strength, muscular endurance, and activity tolerance required for  successful completion of functional activities.  - AAROM L lateral bending and L rotation x10 with PT assisting with mobilization   - Supine bilat shoulder flex with wand (wide grip) with CT junction extension over edge of plinth with cervical retraction + ext; 2x 10 wih cuing for set up and good carry over. Clinician applied end range OP at R shoulder flexion first set, and assisted with keeping cervical spine in neutral rotation second set.  - Doorway pec stretch 2x 30sec holds. Cuing for posture to feel stretch in chest more than arms. - Pulleys flex x15 abd x15 with 3-5sec hold in end range, cuing to "feel back of head on door" (had patient hold paper up on door behind head as  external cue) for posture cuing.  - standing rows 5# 2x 10 with good carry over of neutral posture following cuing (in available range). Intermittent cuing to keep head up and touch clinician's hand, with external feedback from therapist hand and forearm along back - Lat pull down 10# 2x 10 with good carry over of proper technique following cuing, good stretch at end range. Intermittent cuing to keep head up and touch clinician's hand, with external feedback from therapist hand and forearm along back    PT Education - 07/22/19 1420    Education Details  therex form, purpose of exercise, self management    Person(s) Educated  Patient    Methods  Explanation;Demonstration;Tactile cues;Verbal cues    Comprehension  Returned demonstration;Verbal cues required;Verbalized understanding;Tactile cues required       PT Short Term Goals - 06/11/19 1115      PT SHORT TERM GOAL #1   Title  Pt will be independent with HEP in order to improve strength and decrease pain in order to improve pain-free function at home and work.    Time  4    Period  Weeks    Status  New        PT Long Term Goals - 06/11/19 1106      PT LONG TERM GOAL #1   Title  Pt will decrease quick DASH score by at least 8% in order to demonstrate clinically significant reduction in disability.    Baseline  06/11/19 56.8%    Time  8    Period  Weeks    Status  New      PT LONG TERM GOAL #2   Title  Patient will demonstrate full cervical ROM in order to drive safely    Baseline  06/11/19 R/L rotation 55d/10d lateral bending 30d/15d extension 0d    Time  8    Period  Weeks    Status  New      PT LONG TERM GOAL #3   Title  Patient will demonstrate full R shoulder ROM in order to complete ADLs    Baseline  06/11/19 see eval    Time  8    Period  Weeks            Plan - 07/22/19 1419    Clinical Impression Statement  Pt tolerated treatment well. Pt was able to complete all exercises with minimal to no lasting  increase in pain or discomfort. Noted for continued restriction in soft tissue in R shoulder and R lateral, anterior cervical spine that prevents full range of motion. Able to relax into improved ROM but unable to hold position actively or in upright position. Pt required multimodal cuing for proper technique and to facilitate improved neuromuscular control, strength,  range of motion, and functional ability resulting in improved performance and form. Patient would benefit from continued physical therapy to address remaining impairments and functional limitations to work towards stated goals and return to PLOF or maximal functional independence.    Personal Factors and Comorbidities  Age;Behavior Pattern;Comorbidity 1;Comorbidity 2;Past/Current Experience;Fitness;Sex;Time since onset of injury/illness/exacerbation    Comorbidities  scleroderma, cancer    Examination-Activity Limitations  Reach Overhead;Self Feeding;Dressing;Sleep;Lift    Examination-Participation Restrictions  Laundry;Cleaning;Community Activity;Driving    Stability/Clinical Decision Making  Evolving/Moderate complexity    Rehab Potential  Good    PT Frequency  2x / week    PT Treatment/Interventions  ADLs/Self Care Home Management;Aquatic Therapy;Moist Heat;Traction;Ultrasound;Cryotherapy;Parrafin;Functional mobility training;Neuromuscular re-education;Taping;Dry needling;Passive range of motion;Joint Manipulations;Spinal Manipulations;Patient/family education;Therapeutic exercise;Manual techniques;Therapeutic activities;Electrical Stimulation;Fluidtherapy;Iontophoresis 4mg /ml Dexamethasone    PT Next Visit Plan  Increase cervical ROM; postural restoration    Consulted and Agree with Plan of Care  Patient       Patient will benefit from skilled therapeutic intervention in order to improve the following deficits and impairments:  Decreased endurance, Decreased mobility, Pain, Postural dysfunction, Impaired UE functional use, Impaired  flexibility, Increased fascial restricitons, Decreased strength, Decreased activity tolerance, Decreased range of motion, Decreased scar mobility, Impaired tone, Improper body mechanics, Hypomobility  Visit Diagnosis: Muscle weakness (generalized)  Cervicalgia  Stiffness of right shoulder, not elsewhere classified     Problem List There are no active problems to display for this patient.   Everlean Alstrom. Graylon Good, PT, DPT 07/22/19, 2:23 PM  [facility] [address], [place], [address] Phone: [phone]   Fax:  [phone]  Name: Whitney Mclean MRN: [id number] Date of Birth: May 05, 1966

## 2019-07-22 NOTE — Therapy (Signed)
[facility] [address] [place], [address] Phone: [phone]   Fax:  [phone]  Occupational Therapy Treatment  Patient Details  Name: Whitney Mclean MRN: [id number] Date of Birth: 11-06-1965 Referring Provider (OT): Artis Delay   Encounter Date: 07/22/2019  OT End of Session - 07/22/19 1432    Visit Number  8    Number of Visits  13    Date for OT Re-Evaluation  07/23/19    OT Start Time  1350    OT Stop Time  1420    OT Time Calculation (min)  30 min    Activity Tolerance  Patient tolerated treatment well    Behavior During Therapy  [facility] for tasks assessed/performed       Past Medical History:  Diagnosis Date  . Oral cancer [facility])     Past Surgical History:  Procedure Laterality Date  . ABDOMINAL HYSTERECTOMY      There were no vitals filed for this visit.  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  No/denies         [facility] OT Assessment - 07/22/19 0001      Strength   Right Hand Grip (lbs)  30    Right Hand Lateral Pinch  9 lbs    Right Hand 3 Point Pinch  9 lbs    Left Hand Grip (lbs)  28    Left Hand Lateral Pinch  9 lbs    Left Hand 3 Point Pinch  8 lbs      Right Hand AROM   R Index  MCP 0-90  75 Degrees    R Index PIP  0-100  85 Degrees   -35   R Long  MCP 0-90  80 Degrees    R Long PIP 0-100  100 Degrees   -40   R Ring  MCP 0-90  85 Degrees    R Ring PIP 0-100  96 Degrees   -40   R Little  MCP 0-90  80 Degrees    R Little PIP 0-100  90 Degrees   -45     Left Hand AROM   L Index  MCP 0-90  60 Degrees    L Index PIP 0-100  --   -55   L Long  MCP 0-90  60 Degrees    L Long PIP 0-100  --   -55   L Ring  MCP 0-90  40 Degrees    L Ring PIP 0-100  --   -70   L Little  MCP 0-90  35 Degrees    L Little PIP 0-100  --   -  70      Measurements taken see flow sheet - Made some gains at [facility] of L 4th and 5th  And PIP on 2nd and 3rd  Review HEP with pt and add another LMB splint for PIP extention and can use it on all digits now     knuckle bender for L MC flexion to do 2 x day on weekends  And putty increase to mix of med and med firm - 2 x 15 reps  With rolling in between                  OT Education - 07/22/19 1432    Education provided  Yes    Education Details  progress and HEP review    Person(s) Educated  Patient    Methods  Explanation;Demonstration;Tactile cues;Verbal cues;Handout    Comprehension  Returned demonstration;Verbalized understanding       OT Short Term Goals - 07/01/19 0949      OT SHORT TERM GOAL #1   Title  Pt to be independent in HEP to use splint correctly ,  increase AROM  in  bilateral MC flexion , PIP extention ,  wrist flexion and L thumb    Status  Achieved        OT Long Term Goals - 07/01/19 1209      OT LONG TERM GOAL #1   Title  AROM in bilateral hands improve with more than 10 points on function score with PRWHE    Baseline  see flowsheet for measurements for ROM - 37/50 at eval on FUnction score PRWHE - to assess next time - progressing per pt in gripping    Time  3    Period  Weeks    Status  On-going    Target Date  07/22/19      OT LONG TERM GOAL #2   Title  Pt  have knowledge and verbalize use of more than 3 AE to increase  ease of doing ADL's and IADL's    Baseline  no knowledge  -hard time open packages, use scissors, writing , driving ,  hair care, dressing , cleaning - stil need some review    Time  3    Period  Weeks    Status  On-going    Target Date  07/22/19      OT LONG TERM GOAL #3   Title  Bilateral grip strength to be at least more than 15 lbs to turn doorknob, cut food, , squeeze washcloth    Baseline  NT - very little MC flexion - and 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 - now grip 28 and 30 lbs    Status  Achieved            Plan - 07/22/19 1433    OT Occupational Profile and History  Problem Focused Assessment - Including review of records relating to presenting problem    Occupational performance deficits (Please refer to evaluation for details):  ADL's;IADL's;Work;Play;Leisure;Social Participation    Body Structure / Function / Physical Skills  ADL;Decreased knowledge of use of DME;Flexibility;ROM;UE functional use;FMC;Dexterity;Pain;Strength;IADL    Rehab Potential  Fair    Clinical Decision Making  Several treatment options, min-mod task modification necessary    Comorbidities Affecting Occupational Performance:  May have comorbidities impacting occupational performance    Modification or Assistance to Complete Evaluation   Min-Moderate modification of tasks or assist with assess necessary to complete eval  OT Frequency  Biweekly    OT Duration  2 weeks    OT Treatment/Interventions  Self-care/ADL training;Therapeutic exercise;Patient/family education;Paraffin;Manual Therapy;Passive range of motion;Splinting;Moist Heat    Plan  assess progress and tolerance doing HEP- without increasing pain -? paraffin bath for home use and putty    OT Home Exercise Plan  see pt instruction    Consulted and Agree with Plan of Care  Patient       Patient will benefit from skilled therapeutic intervention in order to improve the following deficits and impairments:   Body Structure /  Function / Physical Skills: ADL, Decreased knowledge of use of DME, Flexibility, ROM, UE functional use, FMC, Dexterity, Pain, Strength, IADL       Visit Diagnosis: Muscle weakness (generalized)  Pain in right hand  Stiffness of right hand, not elsewhere classified  Stiffness of left hand, not elsewhere classified  Pain in left hand    Problem List There are no active problems to display for this patient.   Rosalyn Gess OTR/L,CLT 07/22/2019, 2:34 PM  [facility] [address] [place], [address] Phone: [phone]   Fax:  [phone]  Name: Whitney Mclean MRN: [id number] Date of Birth: [birth year]

## 2019-07-26 ENCOUNTER — Ambulatory Visit: Payer: BC Managed Care – PPO | Attending: Internal Medicine

## 2019-07-26 ENCOUNTER — Other Ambulatory Visit: Payer: Self-pay

## 2019-07-26 DIAGNOSIS — M6281 Muscle weakness (generalized): Secondary | ICD-10-CM | POA: Diagnosis not present

## 2019-07-26 DIAGNOSIS — M542 Cervicalgia: Secondary | ICD-10-CM | POA: Insufficient documentation

## 2019-07-26 DIAGNOSIS — M79642 Pain in left hand: Secondary | ICD-10-CM | POA: Diagnosis present

## 2019-07-26 DIAGNOSIS — M25642 Stiffness of left hand, not elsewhere classified: Secondary | ICD-10-CM | POA: Diagnosis present

## 2019-07-26 DIAGNOSIS — M25641 Stiffness of right hand, not elsewhere classified: Secondary | ICD-10-CM | POA: Diagnosis present

## 2019-07-26 DIAGNOSIS — M25611 Stiffness of right shoulder, not elsewhere classified: Secondary | ICD-10-CM

## 2019-07-26 DIAGNOSIS — M79641 Pain in right hand: Secondary | ICD-10-CM | POA: Diagnosis present

## 2019-07-26 NOTE — Therapy (Signed)
[facility] [address] [place], [address] Phone: [phone]   Fax:  [phone]  Physical Therapy Treatment  Patient Details  Name: MYSTICA CEDERQUIST MRN: [id number] Date of Birth: [birth year] No data recorded  Encounter Date: 07/26/2019  PT End of Session - 07/26/19 0909    Visit Number  12    Number of Visits  17    Date for PT Re-Evaluation  08/05/19    PT Start Time  0903    PT Stop Time  0943    PT Time Calculation (min)  40 min    Activity Tolerance  Patient tolerated treatment well;No increased pain    Behavior During Therapy  WFL for tasks assessed/performed       Past Medical History:  Diagnosis Date  . Oral cancer [facility])     Past Surgical History:  Procedure Laterality Date  . ABDOMINAL HYSTERECTOMY      There were no vitals filed for this visit.  Subjective Assessment - 07/26/19 0907    Subjective  Pt doing well this date. Had a low key weekend. Reports no pain this date, nor medical updates.    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 Shoulder pain started 3 months ago, no mechanism of injury, was getting better with heat. Reports she had pain for a about a month, and for the past 2 months the shoulder has been stiff. Has associated neck stiffness since her surgery last year . 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 Has trouble washing her hair, cannot tie her shoes d/t reaching and hand dysfunction, cannot sit up and hold her head up without neck discomfort at her neck, cannot dress herself normally. No pain in shoulder/neck over the past week, describes discomfort/stiffness. 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    Currently in Pain?  No/denies        INTERVENTION THIS DATE: -seated  AA/ROM BUE flexion on physioball 4x30sec sustained hold, followed by 2 minutes of 5secH -TRX strap walkout Right shoulder ABDCT 10secH x 2 minutes -standing right shoulder flexion AA/ROM to 90 degrees 2x15 -standing right shoulder ABDCT AA/ROM to 90 degrees 2x15 -standing elbow flexion 2lb free weight 2x10   -supine AA/ROM Left cervical rotation 1x15 (mobilization with movement)  -supine AA/ROM Left cervical lateral flexion 1x15 (mobilization with movement)  -supine AA/ROM Left cervical retraction 1x15 (mobilization with movement)  -supine chest press AA/ROM 1x15 Right -supine shoulder flexion 1x15 to 90 degrees ( minA required)  -supine Rt skull crusher 1x12 c 1lb dumbbell   -Cable row 1x10 @ 5lb -cable lat pulldown 1x10 @ 10lb     PT Short Term Goals - 06/11/19 1115      PT SHORT TERM GOAL #1   Title  Pt will be independent with HEP in order to improve strength and decrease pain in order to improve pain-free function at home and work.    Time  4    Period  Weeks    Status  New        PT Long Term Goals - 06/11/19 1106      PT LONG TERM GOAL #1   Title  Pt will decrease quick DASH score by at least 8% in order  to demonstrate clinically significant reduction in disability.    Baseline  06/11/19 56.8%    Time  8    Period  Weeks    Status  New      PT LONG TERM GOAL #2   Title  Patient will demonstrate full cervical ROM in order to drive safely    Baseline  06/11/19 R/L rotation 55d/10d lateral bending 30d/15d extension 0d    Time  8    Period  Weeks    Status  New      PT LONG TERM GOAL #3   Title  Patient will demonstrate full R shoulder ROM in order to complete ADLs    Baseline  06/11/19 see eval    Time  8    Period  Weeks            Plan - 07/26/19 0910    Clinical Impression Statement  Continued with current plan of care, gently progressing patient's program aimed at address deficits and limitations identified in evlauation. Pt continues to make steady  progress toward treatment goals in general. Author provides extensive verbal, visual, and tactile cues when needed to assure all interventions are performed with desired form and good accuracy. Extensive communicaiton to assure pt is able to perform all activities without exacerbation of pain or other symptoms. Pt progressed to more open chain strengthening, active assistance provded at end range to allow for full available range in strengthening.    Stability/Clinical Decision Making  Evolving/Moderate complexity    Rehab Potential  Good    PT Frequency  2x / week    PT Duration  8 weeks    PT Treatment/Interventions  ADLs/Self Care Home Management;Aquatic Therapy;Moist Heat;Traction;Ultrasound;Cryotherapy;Parrafin;Functional mobility training;Neuromuscular re-education;Taping;Dry needling;Passive range of motion;Joint Manipulations;Spinal Manipulations;Patient/family education;Therapeutic exercise;Manual techniques;Therapeutic activities;Electrical Stimulation;Fluidtherapy;Iontophoresis 4mg /ml Dexamethasone    PT Next Visit Plan  Increase cervical ROM; postural restoration    Consulted and Agree with Plan of Care  Patient       Patient will benefit from skilled therapeutic intervention in order to improve the following deficits and impairments:  Decreased endurance, Decreased mobility, Pain, Postural dysfunction, Impaired UE functional use, Impaired flexibility, Increased fascial restricitons, Decreased strength, Decreased activity tolerance, Decreased range of motion, Decreased scar mobility, Impaired tone, Improper body mechanics, Hypomobility  Visit Diagnosis: Muscle weakness (generalized)  Cervicalgia  Stiffness of right shoulder, not elsewhere classified     Problem List There are no active problems to display for this patient.  9:32 AM, 07/26/19 Etta Grandchild, PT, DPT Physical Therapist - Ages [phone] (Office)    Buccola,Allan C 07/26/2019, 9:15 AM  [facility] [address] [place], [address] Phone: [phone]   Fax:  [phone]  Name: CARNETTA DEVEAU MRN: [id number] Date of Birth: 01-Oct-1965

## 2019-07-28 ENCOUNTER — Ambulatory Visit: Payer: BC Managed Care – PPO

## 2019-07-28 ENCOUNTER — Other Ambulatory Visit: Payer: Self-pay

## 2019-07-28 DIAGNOSIS — M6281 Muscle weakness (generalized): Secondary | ICD-10-CM

## 2019-07-28 DIAGNOSIS — M542 Cervicalgia: Secondary | ICD-10-CM

## 2019-07-28 DIAGNOSIS — M25611 Stiffness of right shoulder, not elsewhere classified: Secondary | ICD-10-CM

## 2019-07-28 NOTE — Therapy (Signed)
[facility] [address], [place], [address] Phone: [phone]   Fax:  [phone]  Physical Therapy Treatment  Patient Details  Name: Whitney Mclean MRN: [id number] Date of Birth: December 22, 1965 No data recorded  Encounter Date: 07/28/2019  PT End of Session - 07/28/19 0909    Visit Number  13    Number of Visits  17    Date for PT Re-Evaluation  08/05/19    PT Start Time  0904   arrived late from a medical visit   PT Stop Time  0944    PT Time Calculation (min)  40 min    Activity Tolerance  Patient tolerated treatment well;No increased pain    Behavior During Therapy  WFL for tasks assessed/performed       Past Medical History:  Diagnosis Date  . Oral cancer [facility])     Past Surgical History:  Procedure Laterality Date  . ABDOMINAL HYSTERECTOMY      There were no vitals filed for this visit.  Subjective Assessment - 07/28/19 0908    Subjective  Pt doing well. She had a virtual visits appointment with a medical provider to be cleared for opiate pain Rx in the future.    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 Shoulder pain started 3 months ago, no mechanism of injury, was getting better with heat. Reports she had pain for a about a month, and for the past 2 months the shoulder has been stiff. Has associated neck stiffness since her surgery last year . 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 Has trouble washing her hair, cannot tie her shoes d/t reaching and hand dysfunction, cannot sit up and hold her head up without neck discomfort at her neck, cannot dress herself normally. No pain in shoulder/neck over the past week, describes discomfort/stiffness. 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    Currently in  Pain?  No/denies        INTERVENTION THIS DATE: -seated AA/ROM BUE flexion on physioball 5x30sec sustained hold, followed by 15x5secH -TRX strap walkout Right shoulder ABDCT 10secH x 2 minutes -AA/ROM scapular protraction/retraction and thoracic spine rotation on NuStep; Level 1, seat7, SPM 30s-40s, Arms level 13, 4 minutes.  -seated cervical retraction/extension to neutral 1x10x3secH pillow for proprioception -Cable row 1x10 @ 5lb, minGuardAssist for balance assist  -standing pushup on // bars (treadmill) 1x10 -standing elbow flexion 2lb free weight 1x10  -standing right shoulder flexion AA/ROM to 90 degrees 2x15 -standing right shoulder ABDCT AA/ROM to 90 degrees 2x15 -supine AA/ROM Left cervical rotation 1x15 (mobilization with movement)   *Cervical and Shoulder ROM assessment for goal updates.      PT Short Term Goals - 07/28/19 0932      PT SHORT TERM GOAL #1   Title  Pt will be independent with HEP in order to improve strength and decrease pain in order to improve pain-free function at home and work.    Time  4    Period  Weeks    Status  Achieved        PT Long Term Goals - 07/28/19 8588      PT LONG TERM GOAL #1   Title  Pt will decrease quick DASH score by  at least 8% in order to demonstrate clinically significant reduction in disability.    Baseline  06/11/19 56.8%    Time  8    Period  Weeks    Status  On-going      PT LONG TERM GOAL #2   Title  Patient will demonstrate full cervical ROM in order to drive safely    Baseline  06/11/19 R/L rotation 55d/10d lateral bending 30d/15d extension 0d; 11/4 A/ROM 18 degrees, P/ROM 38 degrees; Extension negsative 15 degrees in standing.    Time  8    Period  Weeks    Status  Partially Met      PT LONG TERM GOAL #3   Title  Patient will demonstrate full R shoulder ROM in order to complete ADLs    Baseline  06/11/19 see eval; 11/4 A/ROM flexion 95 degrees, (P/ROM much more)    Time  8    Period  Weeks    Status   On-going            Plan - 07/28/19 0912    Clinical Impression Statement  Pt able to complete entire session as planned with rest breaks provided as needed. Pt maintains high level of focus and motivation. Extensive verbal, visual, and tactile cues are provided for most accurate form possible. Author provides minA intermittently for full ROM when needed. Overall pt continues to make steady progress toward treatment goals.    Comorbidities  scleroderma, cancer    Rehab Potential  Good    PT Frequency  2x / week    PT Duration  8 weeks    PT Treatment/Interventions  ADLs/Self Care Home Management;Aquatic Therapy;Moist Heat;Traction;Ultrasound;Cryotherapy;Parrafin;Functional mobility training;Neuromuscular re-education;Taping;Dry needling;Passive range of motion;Joint Manipulations;Spinal Manipulations;Patient/family education;Therapeutic exercise;Manual techniques;Therapeutic activities;Electrical Stimulation;Fluidtherapy;Iontophoresis 19m/ml Dexamethasone    PT Next Visit Plan  Increase cervical ROM; postural restoration    Consulted and Agree with Plan of Care  Patient       Patient will benefit from skilled therapeutic intervention in order to improve the following deficits and impairments:  Decreased endurance, Decreased mobility, Pain, Postural dysfunction, Impaired UE functional use, Impaired flexibility, Increased fascial restricitons, Decreased strength, Decreased activity tolerance, Decreased range of motion, Decreased scar mobility, Impaired tone, Improper body mechanics, Hypomobility  Visit Diagnosis: Muscle weakness (generalized)  Cervicalgia  Stiffness of right shoulder, not elsewhere classified     Problem List There are no active problems to display for this patient.  9:41 AM, 07/28/19 AEtta Mclean PT, DPT Physical Therapist - [phone](Office)    Whitney Mclean 07/28/2019, 9:41 AM  [facility] [address] [place] [address]Phone: [phone]  Fax:  [phone] Name: Whitney PATERSONMRN: [id number] of Birth: [birth year]

## 2019-08-02 ENCOUNTER — Other Ambulatory Visit: Payer: Self-pay

## 2019-08-02 ENCOUNTER — Ambulatory Visit: Payer: BC Managed Care – PPO | Admitting: Physical Therapy

## 2019-08-02 ENCOUNTER — Encounter: Payer: Self-pay | Admitting: Physical Therapy

## 2019-08-02 ENCOUNTER — Ambulatory Visit: Payer: BC Managed Care – PPO | Admitting: Occupational Therapy

## 2019-08-02 DIAGNOSIS — M542 Cervicalgia: Secondary | ICD-10-CM

## 2019-08-02 DIAGNOSIS — M6281 Muscle weakness (generalized): Secondary | ICD-10-CM

## 2019-08-02 DIAGNOSIS — M25611 Stiffness of right shoulder, not elsewhere classified: Secondary | ICD-10-CM

## 2019-08-02 NOTE — Therapy (Signed)
[facility] [address], [place], [address] Phone: [phone]   Fax:  [phone]  Physical Therapy Treatment  Patient Details  Name: Whitney Mclean MRN: [id number] Date of Birth: 09-25-1965 No data recorded  Encounter Date: 08/02/2019  PT End of Session - 08/02/19 0915    Visit Number  14    Number of Visits  17    Date for PT Re-Evaluation  08/05/19    PT Start Time  0900    PT Stop Time  0942    PT Time Calculation (min)  42 min    Activity Tolerance  Patient tolerated treatment well;No increased pain    Behavior During Therapy  WFL for tasks assessed/performed       Past Medical History:  Diagnosis Date  . Oral cancer [facility])     Past Surgical History:  Procedure Laterality Date  . ABDOMINAL HYSTERECTOMY      There were no vitals filed for this visit.  Subjective Assessment - 08/02/19 0859    Subjective  Pt is doing well, her motion is getting better, reports she finds herself using her RUE more and se can "almost put her hair in a ponytail.    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 Shoulder pain started 3 months ago, no mechanism of injury, was getting better with heat. Reports she had pain for a about a month, and for the past 2 months the shoulder has been stiff. Has associated neck stiffness since her surgery last year . 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 Has trouble washing her hair, cannot tie her shoes d/t reaching and hand dysfunction, cannot sit up and hold her head up without neck discomfort at her neck, cannot dress herself normally. No pain in shoulder/neck over the past week, describes discomfort/stiffness. 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    Limitations   Sitting;Writing;House hold activities;Lifting    How long can you sit comfortably?  30    How long can you stand comfortably?  unlimited    How long can you walk comfortably?  unlimited    Diagnostic tests  None on shoulder    Patient Stated Goals  Increase shoulder strength and mobility       INTERVENTION THIS DATE: -AA/ROM scapular protraction/retraction and thoracic spine rotation on NuStep; Level 1, seat7, SPM 50s, Arms level 13, 4 minutes.  -seated AA/ROM BUE flexion on physioball 4x30sec sustained hold, followed by 2 minutes of 5secH -TRX strap walkout Right shoulder ABDCT 10secH x 2 minutes; flex  -standing right shoulder flexion AA/ROM to 100 degrees x15 AROM to 80d x15 -standing right shoulder ABDCT AA/ROM to 90 degrees x15; AROM to d x15 -standing elbow flexion 2lb free weight 2x10  -supine AA/ROM Left cervical rotation 1x15 (mobilization with movement)  -supine AA/ROM Left cervical lateral flexion 1x15 (mobilization with movement)  -supine AA/ROM Left cervical retraction 1x15 (mobilization with movement)     Manual therapy: to reduce pain and tissue tension, improve range of motion, neuromodulation, in order to promote improved ability to complete functional activities. - PROM shoulder in all directions into pain free/cusp of pain ROM 5-10sec holds - R GHJ distraction10sec distraction 10sec relax x10  - Passive cervical rotation and  lateral bending to L , and ext off edge of mat table with supportsustained holds(able to get upper cervical spine off mat table) 10x 3 second holds - MFR to R UT, SCM, cervical flexors, and pec minor.                      PT Education - 08/02/19 0914    Education Details  therex form    Person(s) Educated  Patient    Methods  Explanation;Demonstration;Verbal cues    Comprehension  Verbalized understanding;Returned demonstration;Verbal cues required       PT Short Term Goals - 07/28/19 0932      PT SHORT TERM GOAL #1    Title  Pt will be independent with HEP in order to improve strength and decrease pain in order to improve pain-free function at home and work.    Time  4    Period  Weeks    Status  Achieved        PT Long Term Goals - 07/28/19 0933      PT LONG TERM GOAL #1   Title  Pt will decrease quick DASH score by at least 8% in order to demonstrate clinically significant reduction in disability.    Baseline  06/11/19 56.8%    Time  8    Period  Weeks    Status  On-going      PT LONG TERM GOAL #2   Title  Patient will demonstrate full cervical ROM in order to drive safely    Baseline  06/11/19 R/L rotation 55d/10d lateral bending 30d/15d extension 0d; 11/4 A/ROM 18 degrees, P/ROM 38 degrees; Extension negsative 15 degrees in standing.    Time  8    Period  Weeks    Status  Partially Met      PT LONG TERM GOAL #3   Title  Patient will demonstrate full R shoulder ROM in order to complete ADLs    Baseline  06/11/19 see eval; 11/4 A/ROM flexion 95 degrees, (P/ROM much more)    Time  8    Period  Weeks    Status  On-going            Plan - 08/02/19 1145    Clinical Impression Statement  PT continued manual techniques for increased skin/ST flexibility with good success. Patinet is able to complete therex progression wihtout increased pain, with good muscle fatigue. Pt complies with all cuing needed for proper technique of therex. Pt motivated throughout session. PT will continue progression as able.    Personal Factors and Comorbidities  Age;Behavior Pattern;Comorbidity 1;Comorbidity 2;Past/Current Experience;Fitness;Sex;Time since onset of injury/illness/exacerbation    Comorbidities  scleroderma, cancer    Examination-Activity Limitations  Reach Overhead;Self Feeding;Dressing;Sleep;Lift    Examination-Participation Restrictions  Laundry;Cleaning;Community Activity;Driving    Stability/Clinical Decision Making  Evolving/Moderate complexity    Clinical Decision Making  Moderate    Rehab  Potential  Good    PT Frequency  2x / week    PT Duration  8 weeks    PT Treatment/Interventions  ADLs/Self Care Home Management;Aquatic Therapy;Moist Heat;Traction;Ultrasound;Cryotherapy;Parrafin;Functional mobility training;Neuromuscular re-education;Taping;Dry needling;Passive range of motion;Joint Manipulations;Spinal Manipulations;Patient/family education;Therapeutic exercise;Manual techniques;Therapeutic activities;Electrical Stimulation;Fluidtherapy;Iontophoresis 45m/ml Dexamethasone    PT Next Visit Plan  Increase cervical ROM; postural restoration    Consulted and Agree with Plan of Care  Patient       Patient will benefit from skilled therapeutic intervention in order to improve the following deficits and impairments:  Decreased endurance, Decreased mobility,  Pain, Postural dysfunction, Impaired UE functional use, Impaired flexibility, Increased fascial restricitons, Decreased strength, Decreased activity tolerance, Decreased range of motion, Decreased scar mobility, Impaired tone, Improper body mechanics, Hypomobility  Visit Diagnosis: Muscle weakness (generalized)  Cervicalgia  Stiffness of right shoulder, not elsewhere classified     Problem List There are no active problems to display for this patient.  Shelton Silvas PT, DPT Shelton Silvas 08/02/2019, 11:47 AM  [facility] [address], [place], [address] Phone: [phone]   Fax:  [phone]  Name: Whitney Mclean MRN: [id number] Date of Birth: 1965/10/30

## 2019-08-03 ENCOUNTER — Other Ambulatory Visit: Payer: Self-pay | Admitting: Internal Medicine

## 2019-08-03 DIAGNOSIS — Z1231 Encounter for screening mammogram for malignant neoplasm of breast: Secondary | ICD-10-CM

## 2019-08-04 ENCOUNTER — Other Ambulatory Visit: Payer: Self-pay

## 2019-08-04 ENCOUNTER — Ambulatory Visit: Payer: BC Managed Care – PPO | Admitting: Physical Therapy

## 2019-08-04 ENCOUNTER — Encounter: Payer: Self-pay | Admitting: Physical Therapy

## 2019-08-04 DIAGNOSIS — M25611 Stiffness of right shoulder, not elsewhere classified: Secondary | ICD-10-CM

## 2019-08-04 DIAGNOSIS — M6281 Muscle weakness (generalized): Secondary | ICD-10-CM

## 2019-08-04 DIAGNOSIS — M542 Cervicalgia: Secondary | ICD-10-CM

## 2019-08-04 NOTE — Therapy (Addendum)
[facility] [address] [place], [address] Phone: [phone]   Fax:  [phone]  Physical Therapy Treatment/Progress Note Reporting 06/11/19 - 08/04/19  Patient Details  Name: Whitney Mclean MRN: [id number] Date of Birth: February 27, 1966 No data recorded  Encounter Date: 08/04/2019  PT End of Session - 08/04/19 0939    Visit Number  15    Number of Visits  29    Date for PT Re-Evaluation  09/15/19    PT Start Time  0900    PT Stop Time  0945    PT Time Calculation (min)  45 min    Activity Tolerance  Patient tolerated treatment well;No increased pain    Behavior During Therapy  WFL for tasks assessed/performed       Past Medical History:  Diagnosis Date  . Oral cancer [facility])     Past Surgical History:  Procedure Laterality Date  . ABDOMINAL HYSTERECTOMY      There were no vitals filed for this visit.  Subjective Assessment - 08/04/19 0903    Subjective  No pain this am, reports she has been able to complete her work at her computer without breaks for a week or so.    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 Shoulder pain started 3 months ago, no mechanism of injury, was getting better with heat. Reports she had pain for a about a month, and for the past 2 months the shoulder has been stiff. Has associated neck stiffness since her surgery last year . 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 Has trouble washing her hair, cannot tie her shoes d/t reaching and hand dysfunction, cannot sit up and hold her head up without neck discomfort at her neck, cannot dress herself normally. No pain in shoulder/neck over the past week, describes discomfort/stiffness. 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    Limitations   Sitting;Writing;House hold activities;Lifting    How long can you sit comfortably?  30    How long can you stand comfortably?  unlimited    How long can you walk comfortably?  unlimited    Diagnostic tests  None on shoulder    Patient Stated Goals  Increase shoulder strength and mobility       INTERVENTION THIS DATE: - Supine over foam roll BUE flex with yellow physioball 87min 3secH - AA/ROM scapular protraction/retraction and thoracic spine rotation on NuStep; Level 2, seat7, SPM 50s, Arms level 13, 4 minutes; cuing for cervical ext to neutral able to hold somewhat -TRX strap walkout Right shoulder ABDCT and flex 10secH x 2 minutes;  -standing right shoulder flexion AA/ROM to 100 degrees x15 AROM to 80d x15 -standing right shoulder ABDCT AA/ROM to 90 degrees x15; AROM to d x15 -standing elbow flexion 2lb free weight 2x10  - Standing row 15# 2x 10 with cuing initially for scapular retraction and cervical     Manual therapy:to reduce pain and tissue tension, improve range of motion, neuromodulation, in order to promote improved ability to complete functional activities. -PROM shoulder in all directions into pain free/cusp of pain ROM 5-10sec holds - RGHJ distraction10sec distraction 10sec relax x10 -Passive cervical rotation and lateral bending to L , and ext off edge of mat table  with supportsustained holds(able to get upper cervical spine off mat table) 10x 3 second holds -MFR to R UT, SCM, cervical flexors, and pec minor.                         PT Education - 08/04/19 0940    Education Details  therex form; reassessment, HEP review    Person(s) Educated  Patient    Methods  Explanation;Demonstration;Verbal cues    Comprehension  Verbalized understanding;Returned demonstration;Verbal cues required       PT Short Term Goals - 08/04/19 0904      PT SHORT TERM GOAL #1   Title  Pt will be independent with HEP in order to improve strength and decrease  pain in order to improve pain-free function at home and work.    Baseline  08/04/19 Completing HEP regularly withotu question or concern    Time  4    Period  Weeks    Status  New        PT Long Term Goals - 08/04/19 [id number]      PT LONG TERM GOAL #1   Title  Pt will decrease quick DASH score by at least 8% in order to demonstrate clinically significant reduction in disability.    Baseline  06/11/19 56.8%; 08/04/19 34%    Time  8    Period  Weeks    Status  Achieved      PT LONG TERM GOAL #2   Title  Patient will demonstrate full cervical ROM in order to drive safely    Baseline  06/11/19 R/L rotation 55d/10d lateral bending 30d/15d extension 0d; 11/4 A/ROM 18 degrees, P/ROM 38 degrees; Extension negsative 15 degrees in standing.    Time  8      PT LONG TERM GOAL #3   Title  Patient will demonstrate full R shoulder ROM in order to complete ADLs    Baseline  06/11/19 see eval; 11/4 A/ROM flexion 95 degrees, (P/ROM much more)    Time  8    Period  Weeks    Status  On-going            Plan - 08/04/19 0933    Clinical Impression Statement  Patient continues to make steady progress toward goals. Demonstrates better independence with work activites and ADLs. Patient continues to have some room for improvement in cervical and R shoulder ROM, which is impacting safety with driving, swallowing ability, and overhead ADLs. Since patient is making progress and has not yet plateaud in motion attainable, she will continue to benefit from skilled PT to improve this and increase independence. PT will reassess in 6 weeks.    Personal Factors and Comorbidities  Age;Behavior Pattern;Comorbidity 1;Comorbidity 2;Past/Current Experience;Fitness;Sex;Time since onset of injury/illness/exacerbation    Comorbidities  scleroderma, cancer    Examination-Activity Limitations  Reach Overhead;Self Feeding;Dressing;Sleep;Lift    Examination-Participation Restrictions  Laundry;Cleaning;Community Activity;Driving     Stability/Clinical Decision Making  Evolving/Moderate complexity    Clinical Decision Making  Moderate    Rehab Potential  Good    PT Frequency  2x / week    PT Duration  8 weeks    PT Treatment/Interventions  ADLs/Self Care Home Management;Aquatic Therapy;Moist Heat;Traction;Ultrasound;Cryotherapy;Parrafin;Functional mobility training;Neuromuscular re-education;Taping;Dry needling;Passive range of motion;Joint Manipulations;Spinal Manipulations;Patient/family education;Therapeutic exercise;Manual techniques;Therapeutic activities;Electrical Stimulation;Fluidtherapy;Iontophoresis 4mg /ml Dexamethasone    PT Next Visit Plan  Increase cervical ROM; postural restoration    Consulted and Agree with Plan of Care  Patient  Patient will benefit from skilled therapeutic intervention in order to improve the following deficits and impairments:  Decreased endurance, Decreased mobility, Pain, Postural dysfunction, Impaired UE functional use, Impaired flexibility, Increased fascial restricitons, Decreased strength, Decreased activity tolerance, Decreased range of motion, Decreased scar mobility, Impaired tone, Improper body mechanics, Hypomobility  Visit Diagnosis: Muscle weakness (generalized)  Cervicalgia  Stiffness of right shoulder, not elsewhere classified     Problem List There are no active problems to display for this patient.  Shelton Silvas PT, DPT Shelton Silvas 08/04/2019, 10:44 AM  [facility] [address] [place], [address] Phone: [phone]   Fax:  [phone]  Name: Whitney Mclean MRN: [id number] Date of Birth: April 11, 1966

## 2019-08-12 ENCOUNTER — Ambulatory Visit: Payer: BC Managed Care – PPO | Admitting: Physical Therapy

## 2019-08-12 ENCOUNTER — Other Ambulatory Visit: Payer: Self-pay

## 2019-08-12 ENCOUNTER — Encounter: Payer: Self-pay | Admitting: Physical Therapy

## 2019-08-12 DIAGNOSIS — M6281 Muscle weakness (generalized): Secondary | ICD-10-CM | POA: Diagnosis not present

## 2019-08-12 DIAGNOSIS — M25611 Stiffness of right shoulder, not elsewhere classified: Secondary | ICD-10-CM

## 2019-08-12 DIAGNOSIS — M542 Cervicalgia: Secondary | ICD-10-CM

## 2019-08-12 NOTE — Therapy (Signed)
[facility] [address], [place], [address] Phone: [phone]   Fax:  [phone]  Physical Therapy Treatment  Patient Details  Name: Whitney Mclean MRN: [id number] Date of Birth: [birth year] No data recorded  Encounter Date: 08/12/2019  PT End of Session - 08/12/19 1401    Visit Number  16    Number of Visits  29    Date for PT Re-Evaluation  09/15/19    PT Start Time  0158   pt arrived late   PT Stop Time  0230    PT Time Calculation (min)  32 min    Activity Tolerance  Patient tolerated treatment well;No increased pain       Past Medical History:  Diagnosis Date  . Oral cancer [facility])     Past Surgical History:  Procedure Laterality Date  . ABDOMINAL HYSTERECTOMY      There were no vitals filed for this visit.  Subjective Assessment - 08/12/19 1359    Subjective  Reports she is able to put her hair up on her own and reaches with her RUE more often. Compliance with HEP.    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 Shoulder pain started 3 months ago, no mechanism of injury, was getting better with heat. Reports she had pain for a about a month, and for the past 2 months the shoulder has been stiff. Has associated neck stiffness since her surgery last year . 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 Has trouble washing her hair, cannot tie her shoes d/t reaching and hand dysfunction, cannot sit up and hold her head up without neck discomfort at her neck, cannot dress herself normally. No pain in shoulder/neck over the past week, describes discomfort/stiffness. 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    Limitations  Sitting;Writing;House hold activities;Lifting    How long can you sit comfortably?  30    How  long can you stand comfortably?  unlimited    How long can you walk comfortably?  unlimited    Diagnostic tests  None on shoulder    Patient Stated Goals  Increase shoulder strength and mobility    Pain Onset  More than a month ago       INTERVENTION THIS DATE:  - AA/ROM scapular protraction/retraction and thoracic spine rotation on NuStep; Level 2, seat7, SPM50s, Arms level 13, 4 minutes; cuing for cervical ext to neutral able to hold somewhat -TRX strap walkout Right shoulder ABDCT and flex 5-10secH x 1 minutes;  - Lat pulldown 10# 3x 12 with eccentric control, good muscle activation + overhead bilat flex stretch  - Seated BUE flex yellow theraball with thoracic roll x15 3-5 sec hold in max ext   Manual therapy:to reduce pain and tissue tension, improve range of motion, neuromodulation, in order to promote improved ability to complete functional activities. -PROM shoulder in all directions into pain free/cusp of pain ROM 5-10sec holds - RGHJ distraction10sec distraction 10sec relax x10 -Passive cervical rotation and lateral bending to L , and ext off edge of mat table with supportsustained holds(able to get upper cervical spine off mat table) 10x 3 second holds -MFR to R UT, SCM, cervical flexors, and pec minor.  PT Education - 08/12/19 1400    Education Details  therex form    Person(s) Educated  Patient    Methods  Explanation;Demonstration;Tactile cues;Verbal cues    Comprehension  Verbalized understanding;Returned demonstration;Verbal cues required;Tactile cues required       PT Short Term Goals - 08/04/19 0904      PT SHORT TERM GOAL #1   Title  Pt will be independent with HEP in order to improve strength and decrease pain in order to improve pain-free function at home and work.    Baseline  08/04/19 Completing HEP regularly withotu question or concern    Time  4    Period  Weeks    Status  New        PT Long  Term Goals - 08/04/19 [id number]      PT LONG TERM GOAL #1   Title  Pt will decrease quick DASH score by at least 8% in order to demonstrate clinically significant reduction in disability.    Baseline  06/11/19 56.8%; 08/04/19 34%    Time  8    Period  Weeks    Status  Achieved      PT LONG TERM GOAL #2   Title  Patient will demonstrate full cervical ROM in order to drive safely    Baseline  06/11/19 R/L rotation 55d/10d lateral bending 30d/15d extension 0d; 11/4 A/ROM 18 degrees, P/ROM 38 degrees; Extension negsative 15 degrees in standing.    Time  8      PT LONG TERM GOAL #3   Title  Patient will demonstrate full R shoulder ROM in order to complete ADLs    Baseline  06/11/19 see eval; 11/4 A/ROM flexion 95 degrees, (P/ROM much more)    Time  8    Period  Weeks    Status  On-going            Plan - 08/12/19 1411    Clinical Impression Statement  PT continued to led patient through therex and manual therapy for increased cervical and shoulder ROM with good success. Patient is tolerating neutral/near neutral cervical positioning for longer time period with therex as sessions progress. PT will continue progression as able.    Personal Factors and Comorbidities  Age;Behavior Pattern;Comorbidity 1;Comorbidity 2;Past/Current Experience;Fitness;Sex;Time since onset of injury/illness/exacerbation    Comorbidities  scleroderma, cancer    Examination-Activity Limitations  Reach Overhead;Self Feeding;Dressing;Sleep;Lift    Examination-Participation Restrictions  Laundry;Cleaning;Community Activity;Driving    Stability/Clinical Decision Making  Evolving/Moderate complexity    Clinical Decision Making  Moderate    Rehab Potential  Good    PT Frequency  2x / week    PT Duration  8 weeks    PT Treatment/Interventions  ADLs/Self Care Home Management;Aquatic Therapy;Moist Heat;Traction;Ultrasound;Cryotherapy;Parrafin;Functional mobility training;Neuromuscular re-education;Taping;Dry needling;Passive  range of motion;Joint Manipulations;Spinal Manipulations;Patient/family education;Therapeutic exercise;Manual techniques;Therapeutic activities;Electrical Stimulation;Fluidtherapy;Iontophoresis 4mg /ml Dexamethasone    PT Next Visit Plan  Increase cervical ROM; postural restoration    Consulted and Agree with Plan of Care  Patient       Patient will benefit from skilled therapeutic intervention in order to improve the following deficits and impairments:  Decreased endurance, Decreased mobility, Pain, Postural dysfunction, Impaired UE functional use, Impaired flexibility, Increased fascial restricitons, Decreased strength, Decreased activity tolerance, Decreased range of motion, Decreased scar mobility, Impaired tone, Improper body mechanics, Hypomobility  Visit Diagnosis: Muscle weakness (generalized)  Cervicalgia  Stiffness of right shoulder, not elsewhere classified     Problem List There are no active problems to display  for this patient.  Whitney Mclean PT, DPT Whitney Mclean 08/12/2019, 3:40 PM  [facility] [address], [place], [address] Phone: [phone]   Fax:  [phone]  Name: Whitney Mclean MRN: [id number] Date of Birth: Dec 11, 1965

## 2019-08-16 ENCOUNTER — Ambulatory Visit: Payer: BC Managed Care – PPO | Admitting: Physical Therapy

## 2019-08-18 ENCOUNTER — Ambulatory Visit: Payer: BC Managed Care – PPO | Admitting: Physical Therapy

## 2019-08-18 ENCOUNTER — Encounter: Payer: Self-pay | Admitting: Physical Therapy

## 2019-08-18 ENCOUNTER — Other Ambulatory Visit: Payer: Self-pay

## 2019-08-18 DIAGNOSIS — M6281 Muscle weakness (generalized): Secondary | ICD-10-CM | POA: Diagnosis not present

## 2019-08-18 DIAGNOSIS — M542 Cervicalgia: Secondary | ICD-10-CM

## 2019-08-18 DIAGNOSIS — M25611 Stiffness of right shoulder, not elsewhere classified: Secondary | ICD-10-CM

## 2019-08-18 NOTE — Therapy (Signed)
[facility] [address] [place], [address] Phone: [phone]   Fax:  [phone]  Physical Therapy Treatment  Patient Details  Name: Whitney INBODEN MRN: [id number] Date of Birth: 1966-08-15 No data recorded  Encounter Date: 08/18/2019  PT End of Session - 08/18/19 1352    Visit Number  17    Number of Visits  29    Date for PT Re-Evaluation  09/15/19    PT Start Time  0145    PT Stop Time  0225    PT Time Calculation (min)  40 min    Activity Tolerance  Patient tolerated treatment well;No increased pain    Behavior During Therapy  WFL for tasks assessed/performed       Past Medical History:  Diagnosis Date  . Oral cancer [facility])     Past Surgical History:  Procedure Laterality Date  . ABDOMINAL HYSTERECTOMY      There were no vitals filed for this visit.  Subjective Assessment - 08/18/19 1350    Subjective  Continues to do well, reports minimal pain. 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 Shoulder pain started 3 months ago, no mechanism of injury, was getting better with heat. Reports she had pain for a about a month, and for the past 2 months the shoulder has been stiff. Has associated neck stiffness since her surgery last year . 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 Has trouble washing her hair, cannot tie her shoes d/t reaching and hand dysfunction, cannot sit up and hold her head up without neck discomfort at her neck, cannot dress herself normally. No pain in shoulder/neck over the past week, describes discomfort/stiffness. 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    Limitations  Sitting;Writing;House hold activities;Lifting    How long can you sit comfortably?  30     How long can you stand comfortably?  unlimited    How long can you walk comfortably?  unlimited    Diagnostic tests  None on shoulder    Patient Stated Goals  Increase shoulder strength and mobility       INTERVENTION THIS DATE:  -AA/ROM scapular protraction/retraction and thoracic spine rotation on NuStep; Level3, seat7, SPM60s, Arms level 13>12, 4 minutes; cuing for cervical ext to neutral able to hold somewhat -TRX strap walkout Right shoulder ABDCTand flex5-10secH x 2 minutes;  - AAROM wand flex in hooklying x15 3-5sec - AROM shoulder flex in hooklying BUE 1# 3x 10 90-120 d/t difficulty with initiation from 0d, good success in this ROM - Bilat AROM shoulder abd to 130 x10; with 1# 2x 10 to about 90/100d with cuing to attempt to keep UEs on mat table with scapular retraction with decent carry over   Manual therapy:to reduce pain and tissue tension, improve range of motion, neuromodulation, in order to promote improved ability to complete functional activities. -PROM shoulder in all directions into pain free/cusp of pain ROM 5-10sec holds - RGHJ distraction10sec distraction 10sec relax x10 -Passive cervical rotation and lateral bending to L , and ext off edge of mat table with supportsustained holds(able to get upper cervical spine off mat table) 10x 3 second holds -MFR to R UT, SCM, cervical  flexors, and pec minor.                         PT Education - 08/18/19 1352    Education Details  therex form    Person(s) Educated  Patient    Methods  Explanation;Demonstration;Verbal cues    Comprehension  Verbalized understanding;Returned demonstration;Verbal cues required       PT Short Term Goals - 08/04/19 0904      PT SHORT TERM GOAL #1   Title  Pt will be independent with HEP in order to improve strength and decrease pain in order to improve pain-free function at home and work.    Baseline  08/04/19 Completing HEP regularly withotu  question or concern    Time  4    Period  Weeks    Status  New        PT Long Term Goals - 08/04/19 [id number]      PT LONG TERM GOAL #1   Title  Pt will decrease quick DASH score by at least 8% in order to demonstrate clinically significant reduction in disability.    Baseline  06/11/19 56.8%; 08/04/19 34%    Time  8    Period  Weeks    Status  Achieved      PT LONG TERM GOAL #2   Title  Patient will demonstrate full cervical ROM in order to drive safely    Baseline  06/11/19 R/L rotation 55d/10d lateral bending 30d/15d extension 0d; 11/4 A/ROM 18 degrees, P/ROM 38 degrees; Extension negsative 15 degrees in standing.    Time  8      PT LONG TERM GOAL #3   Title  Patient will demonstrate full R shoulder ROM in order to complete ADLs    Baseline  06/11/19 see eval; 11/4 A/ROM flexion 95 degrees, (P/ROM much more)    Time  8    Period  Weeks    Status  On-going            Plan - 08/18/19 1413    Clinical Impression Statement  PT continued progression for increased shoulder/cervical motion and strength with good  success. Pt is able to comply with all cuing for technique/form. PT will continue progression as able.    Personal Factors and Comorbidities  Age;Behavior Pattern;Comorbidity 1;Comorbidity 2;Past/Current Experience;Fitness;Sex;Time since onset of injury/illness/exacerbation    Comorbidities  scleroderma, cancer    Examination-Activity Limitations  Reach Overhead;Self Feeding;Dressing;Sleep;Lift    Examination-Participation Restrictions  Laundry;Cleaning;Community Activity;Driving    Stability/Clinical Decision Making  Evolving/Moderate complexity    Clinical Decision Making  Moderate    Rehab Potential  Good    PT Frequency  2x / week    PT Duration  8 weeks    PT Treatment/Interventions  ADLs/Self Care Home Management;Aquatic Therapy;Moist Heat;Traction;Ultrasound;Cryotherapy;Parrafin;Functional mobility training;Neuromuscular re-education;Taping;Dry needling;Passive  range of motion;Joint Manipulations;Spinal Manipulations;Patient/family education;Therapeutic exercise;Manual techniques;Therapeutic activities;Electrical Stimulation;Fluidtherapy;Iontophoresis 4mg /ml Dexamethasone    PT Next Visit Plan  Increase cervical ROM; postural restoration    Consulted and Agree with Plan of Care  Patient       Patient will benefit from skilled therapeutic intervention in order to improve the following deficits and impairments:  Decreased endurance, Decreased mobility, Pain, Postural dysfunction, Impaired UE functional use, Impaired flexibility, Increased fascial restricitons, Decreased strength, Decreased activity tolerance, Decreased range of motion, Decreased scar mobility, Impaired tone, Improper body mechanics, Hypomobility  Visit Diagnosis: Muscle weakness (generalized)  Cervicalgia  Stiffness of right shoulder, not elsewhere classified  Problem List There are no active problems to display for this patient.  Shelton Silvas PT, DPT  Shelton Silvas 08/18/2019, 2:28 PM  [facility] [address] [place], [address] Phone: [phone]   Fax:  [phone]  Name: MICAYA ARCEO MRN: [id number] Date of Birth: 03/23/1966

## 2019-08-23 ENCOUNTER — Other Ambulatory Visit: Payer: Self-pay

## 2019-08-23 ENCOUNTER — Ambulatory Visit: Payer: BC Managed Care – PPO | Admitting: Occupational Therapy

## 2019-08-23 DIAGNOSIS — M79641 Pain in right hand: Secondary | ICD-10-CM

## 2019-08-23 DIAGNOSIS — M25641 Stiffness of right hand, not elsewhere classified: Secondary | ICD-10-CM

## 2019-08-23 DIAGNOSIS — M25642 Stiffness of left hand, not elsewhere classified: Secondary | ICD-10-CM

## 2019-08-23 DIAGNOSIS — M6281 Muscle weakness (generalized): Secondary | ICD-10-CM

## 2019-08-23 DIAGNOSIS — M79642 Pain in left hand: Secondary | ICD-10-CM

## 2019-08-23 NOTE — Patient Instructions (Signed)
Same - but make sure paraffin prior to knuckle bender splint and putty afterwards  Can do LMB PIP extention splints during day

## 2019-08-23 NOTE — Therapy (Signed)
[facility] [address], [place], [address] Phone: [phone]   Fax:  [phone]  Occupational Therapy Treatment  Patient Details  Name: Whitney Mclean MRN: [id number] Date of Birth: 12/14/1965 Referring Provider (OT): Artis Delay   Encounter Date: 08/23/2019  OT End of Session - 08/23/19 1507    Visit Number  9    Number of Visits  9    Date for OT Re-Evaluation  08/23/19    OT Start Time  1430    OT Stop Time  1505    OT Time Calculation (min)  35 min    Activity Tolerance  Patient tolerated treatment well    Behavior During Therapy  [facility] for tasks assessed/performed       Past Medical History:  Diagnosis Date  . Oral cancer [facility])     Past Surgical History:  Procedure Laterality Date  . ABDOMINAL HYSTERECTOMY      There were no vitals filed for this visit.  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  No/denies         [facility] OT Assessment - 08/23/19 0001      AROM   Right Wrist Extension  70 Degrees    Right Wrist Flexion  65 Degrees    Right Wrist Radial Deviation  28 Degrees    Right Wrist Ulnar Deviation  35 Degrees    Left Wrist Extension  70 Degrees    Left Wrist Flexion  55 Degrees    Left Wrist Radial Deviation  21  Degrees    Left Wrist Ulnar Deviation  30 Degrees      Strength   Right Hand Grip (lbs)  34    Right Hand Lateral Pinch  8 lbs    Right Hand 3 Point Pinch  9 lbs    Left Hand Grip (lbs)  28    Left Hand Lateral Pinch  8 lbs    Left Hand 3 Point Pinch  7 lbs      Right Hand AROM   R Index  MCP 0-90  75 Degrees    R Index PIP 0-100  85 Degrees   -40   R Long  MCP 0-90  82 Degrees    R Long PIP 0-100  100 Degrees   -45   R Ring  MCP 0-90  85 Degrees    R Ring PIP 0-100  96 Degrees   -45   R Little  MCP 0-90  82 Degrees    R Little PIP 0-100  90 Degrees   -45     Left Hand  AROM   L Index  MCP 0-90  60 Degrees    L Index PIP 0-100  --   -60   L Long  MCP 0-90  60 Degrees    L Long PIP 0-100  --   -60   L Ring  MCP 0-90  40 Degrees    L Ring PIP 0-100  --   -75   L Little  MCP 0-90  30 Degrees    L Little PIP 0-100  --   -65         Measurements taken see flow sheet - was able to maintain her motion mostly   Review HEP  And sequence - of paraffin , knuckle bender for MC flexion  And then putty   LMB splint for PIP extention and can use it on all digits now and during day    knuckle bender for L MC flexion to do 2 x day on weekends  And putty cont with mix of med and med firm - 2 x 15 reps  With rolling in between   Follow up in 3 months after the winter - to assess if able to maintain and progress with HEP  Done great this past 2 months               OT Education - 08/23/19 1507    Education provided  Yes    Education Details  discharge instructions    Person(s) Educated  Patient    Methods  Explanation;Demonstration;Tactile cues;Verbal cues;Handout    Comprehension  Returned demonstration;Verbalized understanding       OT Short Term Goals - 07/01/19 0949      OT SHORT TERM GOAL #1   Title  Pt to be independent in HEP to use splint correctly ,  increase AROM  in  bilateral MC flexion , PIP extention ,  wrist flexion and L thumb     Status  Achieved        OT Long Term Goals - 08/23/19 1640      OT LONG TERM GOAL #1   Title  AROM in bilateral hands improve with more than 10 points on function score with PRWHE    Baseline  see flowsheet for measurements for ROM - 37/50 at eval on FUnction score PRWHE - improve with 20 points    Status  Achieved      OT LONG TERM GOAL #2   Title  Pt  have knowledge and verbalize use of more than 3 AE to increase ease of doing ADL's and IADL's    Status  Achieved      OT LONG TERM GOAL #3   Title  Bilateral grip strength to be at least more than 15 lbs to turn doorknob, cut food, , squeeze washcloth    Baseline  Grip R 34  L 28 lbs    Status  Achieved            Plan - 08/23/19 1508    Clinical Impression Statement  Pt was able to maintain most all of her AROM in digits with doing her HEP of paraffin , knuckle bender splint for MC flexion ,and LMB splints for PIP extention - and then putty - grip on R increase 4 lbs - but did decrease 1 lbs on prehension -but could be that temperature that is colder now - pt to cont with HEP for about 3 months and contact me for reassement if she was able to maintain  her motion thru the winter    OT Occupational Profile and History  Problem Focused Assessment - Including review of records relating to presenting problem    Occupational performance deficits (Please refer to evaluation for details):  ADL's;IADL's;Work;Play;Leisure;Social Participation    Body Structure / Function / Physical Skills  ADL;Decreased knowledge of use of DME;Flexibility;ROM;UE functional use;FMC;Dexterity;Pain;Strength;IADL    Rehab Potential  Fair    Clinical Decision Making  Several treatment options, min-mod task modification necessary    Comorbidities Affecting Occupational Performance:  May have comorbidities impacting occupational performance    Modification or Assistance to Complete Evaluation   Min-Moderate modification of tasks or assist with assess necessary to  complete eval    Plan  cont with HEP for 3 months    OT Home Exercise Plan  see pt instruction    Consulted and Agree with Plan of Care  Patient       Patient will benefit from skilled therapeutic intervention in order to improve the following deficits and impairments:   Body Structure / Function / Physical Skills: ADL, Decreased knowledge of use of DME, Flexibility, ROM, UE functional use, FMC, Dexterity, Pain, Strength, IADL       Visit Diagnosis: Pain in right hand - Plan: Ot plan of care cert/re-cert  Stiffness of right hand, not elsewhere classified - Plan: Ot plan of care cert/re-cert  Stiffness of left hand, not elsewhere classified - Plan: Ot plan of care cert/re-cert  Pain in left hand - Plan: Ot plan of care cert/re-cert  Muscle weakness (generalized) - Plan: Ot plan of care cert/re-cert    Problem List There are no active problems to display for this patient.   Rosalyn Gess OTR/L,CLT 08/23/2019, 4:44 PM  [facility] [address], [place], [address] Phone: [phone]   Fax:  [phone]  Name: Whitney Mclean MRN: [id number] Date of Birth: [birth year]

## 2019-08-24 ENCOUNTER — Encounter: Payer: Self-pay | Admitting: Physical Therapy

## 2019-08-24 ENCOUNTER — Ambulatory Visit: Payer: BC Managed Care – PPO | Attending: Internal Medicine | Admitting: Physical Therapy

## 2019-08-24 DIAGNOSIS — M6281 Muscle weakness (generalized): Secondary | ICD-10-CM | POA: Insufficient documentation

## 2019-08-24 DIAGNOSIS — M79641 Pain in right hand: Secondary | ICD-10-CM | POA: Insufficient documentation

## 2019-08-24 DIAGNOSIS — M25641 Stiffness of right hand, not elsewhere classified: Secondary | ICD-10-CM | POA: Insufficient documentation

## 2019-08-24 DIAGNOSIS — M25611 Stiffness of right shoulder, not elsewhere classified: Secondary | ICD-10-CM | POA: Diagnosis present

## 2019-08-24 DIAGNOSIS — M542 Cervicalgia: Secondary | ICD-10-CM | POA: Insufficient documentation

## 2019-08-24 NOTE — Therapy (Signed)
[facility] [address] [place], [address] Phone: [phone]   Fax:  [phone]  Physical Therapy Treatment  Patient Details  Name: Whitney Mclean MRN: [id number] Date of Birth: [birth year] No data recorded  Encounter Date: 08/24/2019  PT End of Session - 08/24/19 1447    Visit Number  18    Number of Visits  29    Date for PT Re-Evaluation  09/15/19    PT Start Time  0230    PT Stop Time  0315    PT Time Calculation (min)  45 min    Activity Tolerance  Patient tolerated treatment well;No increased pain    Behavior During Therapy  WFL for tasks assessed/performed       Past Medical History:  Diagnosis Date  . Oral cancer [facility])     Past Surgical History:  Procedure Laterality Date  . ABDOMINAL HYSTERECTOMY      There were no vitals filed for this visit.  Subjective Assessment - 08/24/19 1433    Subjective  Reports she is having some "shakiness" in her arms and legs, but has eaten 2 good meals and is hydrated. Reports this happenes on and off. No pain today. Is putting her own hair up which she is happy with.    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 Shoulder pain started 3 months ago, no mechanism of injury, was getting better with heat. Reports she had pain for a about a month, and for the past 2 months the shoulder has been stiff. Has associated neck stiffness since her surgery last year . 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 Has trouble washing her hair, cannot tie her shoes d/t reaching and hand dysfunction, cannot sit up and hold her head up without neck discomfort at her neck, cannot dress herself normally. No pain in shoulder/neck over the past week, describes discomfort/stiffness. Is seeing OT bilat  hands d/t scleraderma stiffness.    Limitations  Sitting;Writing;House hold activities;Lifting    How long can you sit comfortably?  30    How long can you stand comfortably?  unlimited    How long can you walk comfortably?  unlimited    Diagnostic tests  None on shoulder    Patient Stated Goals  Increase shoulder strength and mobility    Pain Onset  More than a month ago       INTERVENTION THIS DATE:  -AA/ROM scapular protraction/retraction and thoracic spine rotation on NuStep; Level2, seat7, SPM60s, Arms level 11, 4 minutes; cuing for cervical ext to neutral able to hold somewhat -TRX strap walkout Right shoulder ABDCTand flex5-10secH x96minutes;  - AROM shoulder flex in supine BUE 1# 3x 10 40-120 d/t difficulty with initiation from 0d, good success in this ROM, improvement from last session  -Bilat AROM shoulder abd in supine to 130 x10; with 1# 2x 10 to about 90/100d with cuing to attempt to keep UEs on mat table with scapular retraction with decent carry over - Bilat scapular push in supine 2# 2x 10 with guarding at LUE to prevent uncontrolled descent; good carry over of cuing for proper technique  Manual therapy:to reduce pain and tissue tension, improve range of motion, neuromodulation, in order to promote improved ability to  complete functional activities. -PROM shoulder in all directions into pain free/cusp of pain ROM 5-10sec holds -Passive cervical rotation and lateral bending to L , and ext off edge of mat table with supportsustained holds(able to get upper cervical spine off mat table) 10x 3 second holds -MFR to R UT, SCM, cervical flexors, and pec minor.                         PT Education - 08/24/19 1445    Education Details  therex form    Person(s) Educated  Patient    Methods  Explanation;Demonstration;Verbal cues    Comprehension  Verbalized understanding;Returned demonstration;Verbal cues required       PT Short Term Goals  - 08/04/19 0904      PT SHORT TERM GOAL #1   Title  Pt will be independent with HEP in order to improve strength and decrease pain in order to improve pain-free function at home and work.    Baseline  08/04/19 Completing HEP regularly withotu question or concern    Time  4    Period  Weeks    Status  New        PT Long Term Goals - 08/04/19 [id number]      PT LONG TERM GOAL #1   Title  Pt will decrease quick DASH score by at least 8% in order to demonstrate clinically significant reduction in disability.    Baseline  06/11/19 56.8%; 08/04/19 34%    Time  8    Period  Weeks    Status  Achieved      PT LONG TERM GOAL #2   Title  Patient will demonstrate full cervical ROM in order to drive safely    Baseline  06/11/19 R/L rotation 55d/10d lateral bending 30d/15d extension 0d; 11/4 A/ROM 18 degrees, P/ROM 38 degrees; Extension negsative 15 degrees in standing.    Time  8      PT LONG TERM GOAL #3   Title  Patient will demonstrate full R shoulder ROM in order to complete ADLs    Baseline  06/11/19 see eval; 11/4 A/ROM flexion 95 degrees, (P/ROM much more)    Time  8    Period  Weeks    Status  On-going            Plan - 08/24/19 1558    Clinical Impression Statement  PT continued progression for increased motion and strength with success. Patient is able to demonstrate increased motion with resistance in gravity eliminated positions with confedience. PT will continued progression as able.    Personal Factors and Comorbidities  Age;Behavior Pattern;Comorbidity 1;Comorbidity 2;Past/Current Experience;Fitness;Sex;Time since onset of injury/illness/exacerbation    Comorbidities  scleroderma, cancer    Examination-Activity Limitations  Reach Overhead;Self Feeding;Dressing;Sleep;Lift    Examination-Participation Restrictions  Laundry;Cleaning;Community Activity;Driving    Stability/Clinical Decision Making  Evolving/Moderate complexity    Clinical Decision Making  Moderate    Rehab  Potential  Good    PT Frequency  2x / week    PT Duration  8 weeks    PT Treatment/Interventions  ADLs/Self Care Home Management;Aquatic Therapy;Moist Heat;Traction;Ultrasound;Cryotherapy;Parrafin;Functional mobility training;Neuromuscular re-education;Taping;Dry needling;Passive range of motion;Joint Manipulations;Spinal Manipulations;Patient/family education;Therapeutic exercise;Manual techniques;Therapeutic activities;Electrical Stimulation;Fluidtherapy;Iontophoresis 4mg /ml Dexamethasone    PT Next Visit Plan  Increase cervical ROM; postural restoration    Consulted and Agree with Plan of Care  Patient       Patient will benefit from skilled therapeutic intervention in order to improve the  following deficits and impairments:  Decreased endurance, Decreased mobility, Pain, Postural dysfunction, Impaired UE functional use, Impaired flexibility, Increased fascial restricitons, Decreased strength, Decreased activity tolerance, Decreased range of motion, Decreased scar mobility, Impaired tone, Improper body mechanics, Hypomobility  Visit Diagnosis: Muscle weakness (generalized)  Stiffness of right shoulder, not elsewhere classified  Cervicalgia     Problem List There are no active problems to display for this patient.  Shelton Silvas PT, DPT Shelton Silvas 08/24/2019, 4:00 PM  [facility] [address], [place], [address] Phone: [phone]   Fax:  [phone]  Name: Whitney Mclean MRN: [id number] Date of Birth: 10-Oct-1965

## 2019-08-31 ENCOUNTER — Ambulatory Visit: Payer: BC Managed Care – PPO | Admitting: Physical Therapy

## 2019-08-31 ENCOUNTER — Encounter: Payer: Self-pay | Admitting: Physical Therapy

## 2019-08-31 ENCOUNTER — Other Ambulatory Visit: Payer: Self-pay

## 2019-08-31 DIAGNOSIS — M542 Cervicalgia: Secondary | ICD-10-CM

## 2019-08-31 DIAGNOSIS — M6281 Muscle weakness (generalized): Secondary | ICD-10-CM

## 2019-08-31 DIAGNOSIS — M25611 Stiffness of right shoulder, not elsewhere classified: Secondary | ICD-10-CM

## 2019-08-31 NOTE — Therapy (Addendum)
[facility] [address] [place], [address] Phone: [phone]   Fax:  [phone]  Physical Therapy Treatment/Progress Note Reporting Period 08/04/19 - 09/15/19  Patient Details  Name: Whitney Mclean MRN: [id number] Date of Birth: 1965-09-28 No data recorded  Encounter Date: 08/31/2019  PT End of Session - 08/31/19 1440    Visit Number  19    Number of Visits  29    Date for PT Re-Evaluation  09/15/19    PT Start Time  0230    PT Stop Time  0315    PT Time Calculation (min)  45 min    Activity Tolerance  Patient tolerated treatment well;No increased pain    Behavior During Therapy  WFL for tasks assessed/performed       Past Medical History:  Diagnosis Date  . Oral cancer [facility])     Past Surgical History:  Procedure Laterality Date  . ABDOMINAL HYSTERECTOMY      There were no vitals filed for this visit.  Subjective Assessment - 08/31/19 1435    Subjective  Reports minimal pain, some continued stiffness. Patient reports her swallowing is getting better.    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 Shoulder pain started 3 months ago, no mechanism of injury, was getting better with heat. Reports she had pain for a about a month, and for the past 2 months the shoulder has been stiff. Has associated neck stiffness since her surgery last year . 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 Has trouble washing her hair, cannot tie her shoes d/t reaching and hand dysfunction, cannot sit up and hold her head up without neck discomfort at her neck, cannot dress herself normally. No pain in shoulder/neck over the past week, describes discomfort/stiffness. 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    Limitations   Sitting;Writing;House hold activities;Lifting    How long can you sit comfortably?  30    How long can you stand comfortably?  unlimited    How long can you walk comfortably?  unlimited    Diagnostic tests  None on shoulder    Patient Stated Goals  Increase shoulder strength and mobility    Pain Onset  More than a month ago      Ther-Ex  -AA/ROM scapular protraction/retraction and thoracic spine rotation on NuStep; Level2, seat7, SPM60s, Arms level 11, 5 minutes; cuing for cervical ext to neutral able to hold somewhat - Lat pulldown 10# 3x 10 with assistance needed for reaching initially, good AAROM in eccentric and concentric lat activiation  - AROM shoulder flex in supine BUE 1# 3x 10 30-120 d/t difficulty with initiation from 0d, good success in this ROM, improvement from last session  -Bilat AROM shoulder abd in supine to 130 x10; with 1# 2x 10 to about 90/100d with cuing to attempt to keep UEs on mat table with scapular retraction with decent carry over - Bilat scapular push in supine 2# 2x 10 with guarding at LUE to prevent uncontrolled descent; good carry over of cuing for proper technique  Manual therapy:to reduce pain and tissue tension, improve range of motion, neuromodulation, in order to promote improved ability to complete functional activities. -PROM shoulder in all directions into pain free/cusp of  pain ROM 5-10sec holds -Passive cervical rotation and lateral bending to L , and ext off edge of mat table with supportsustained holds(able to get upper cervical spine off mat table) 10x 3 second holds -MFR to R UT, SCM, cervical flexors, and pec minor.                         PT Education - 08/31/19 1437    Education Details  therex form    Person(s) Educated  Patient    Methods  Explanation;Demonstration;Tactile cues;Verbal cues    Comprehension  Verbalized understanding;Returned demonstration;Verbal cues required;Tactile cues required        PT Short Term Goals - 08/04/19 0904      PT SHORT TERM GOAL #1   Title  Pt will be independent with HEP in order to improve strength and decrease pain in order to improve pain-free function at home and work.    Baseline  08/04/19 Completing HEP regularly withotu question or concern    Time  4    Period  Weeks    Status  New        PT Long Term Goals - 08/04/19 [id number]      PT LONG TERM GOAL #1   Title  Pt will decrease quick DASH score by at least 8% in order to demonstrate clinically significant reduction in disability.    Baseline  06/11/19 56.8%; 08/04/19 34%    Time  8    Period  Weeks    Status  Achieved      PT LONG TERM GOAL #2   Title  Patient will demonstrate full cervical ROM in order to drive safely    Baseline  06/11/19 R/L rotation 55d/10d lateral bending 30d/15d extension 0d; 11/4 A/ROM 18 degrees, P/ROM 38 degrees; Extension negsative 15 degrees in standing.    Time  8      PT LONG TERM GOAL #3   Title  Patient will demonstrate full R shoulder ROM in order to complete ADLs    Baseline  06/11/19 see eval; 11/4 A/ROM flexion 95 degrees, (P/ROM much more)    Time  8    Period  Weeks    Status  On-going            Plan - 08/31/19 1443    Clinical Impression Statement  PT continued therex progression for UE motion and periscapular/cervical strength with good success. Patinet is able to increase ROM with resistance exercises, and carry over with cuing for neutral cspine as close as availible with good success and good motivaiton throughout session. PT will continue progression as able.    Personal Factors and Comorbidities  Age;Behavior Pattern;Comorbidity 1;Comorbidity 2;Past/Current Experience;Fitness;Sex;Time since onset of injury/illness/exacerbation    Comorbidities  scleroderma, cancer    Examination-Activity Limitations  Reach Overhead;Self Feeding;Dressing;Sleep;Lift    Examination-Participation Restrictions  Laundry;Cleaning;Community Activity;Driving     Stability/Clinical Decision Making  Evolving/Moderate complexity    Clinical Decision Making  Moderate    Rehab Potential  Good    PT Frequency  2x / week    PT Duration  8 weeks    PT Treatment/Interventions  ADLs/Self Care Home Management;Aquatic Therapy;Moist Heat;Traction;Ultrasound;Cryotherapy;Parrafin;Functional mobility training;Neuromuscular re-education;Taping;Dry needling;Passive range of motion;Joint Manipulations;Spinal Manipulations;Patient/family education;Therapeutic exercise;Manual techniques;Therapeutic activities;Electrical Stimulation;Fluidtherapy;Iontophoresis 4mg /ml Dexamethasone    PT Next Visit Plan  Increase cervical ROM; postural restoration    Consulted and Agree with Plan of Care  Patient       Patient will benefit from  skilled therapeutic intervention in order to improve the following deficits and impairments:  Decreased endurance, Decreased mobility, Pain, Postural dysfunction, Impaired UE functional use, Impaired flexibility, Increased fascial restricitons, Decreased strength, Decreased activity tolerance, Decreased range of motion, Decreased scar mobility, Impaired tone, Improper body mechanics, Hypomobility  Visit Diagnosis: Muscle weakness (generalized)  Stiffness of right shoulder, not elsewhere classified  Cervicalgia     Problem List There are no active problems to display for this patient.  Shelton Silvas PT, DPT Shelton Silvas 08/31/2019, 3:25 PM  [facility] [address], [place], [address] Phone: [phone]   Fax:  [phone]  Name: JAMIE HAKALA MRN: [id number] Date of Birth: [birth year]

## 2019-09-08 ENCOUNTER — Ambulatory Visit: Payer: BC Managed Care – PPO | Admitting: Physical Therapy

## 2019-09-15 ENCOUNTER — Other Ambulatory Visit: Payer: Self-pay

## 2019-09-15 ENCOUNTER — Encounter: Payer: Self-pay | Admitting: Physical Therapy

## 2019-09-15 ENCOUNTER — Ambulatory Visit: Payer: BC Managed Care – PPO | Admitting: Physical Therapy

## 2019-09-15 DIAGNOSIS — M542 Cervicalgia: Secondary | ICD-10-CM

## 2019-09-15 DIAGNOSIS — M25641 Stiffness of right hand, not elsewhere classified: Secondary | ICD-10-CM

## 2019-09-15 DIAGNOSIS — M25611 Stiffness of right shoulder, not elsewhere classified: Secondary | ICD-10-CM

## 2019-09-15 DIAGNOSIS — M6281 Muscle weakness (generalized): Secondary | ICD-10-CM | POA: Diagnosis not present

## 2019-09-15 DIAGNOSIS — M79641 Pain in right hand: Secondary | ICD-10-CM

## 2019-09-15 NOTE — Therapy (Signed)
[facility] [address] [place], [address] Phone: [phone]   Fax:  [phone]  Physical Therapy Treatment  Patient Details  Name: Whitney Mclean MRN: [id number] Date of Birth: 1966-05-16 No data recorded  Encounter Date: 09/15/2019  PT End of Session - 09/15/19 1242    Visit Number  20    Number of Visits  45    Date for PT Re-Evaluation  11/11/19    PT Start Time  1115    PT Stop Time  1200    PT Time Calculation (min)  45 min    Activity Tolerance  Patient tolerated treatment well;No increased pain    Behavior During Therapy  WFL for tasks assessed/performed       Past Medical History:  Diagnosis Date  . Oral cancer [facility])     Past Surgical History:  Procedure Laterality Date  . ABDOMINAL HYSTERECTOMY      There were no vitals filed for this visit.  Subjective Assessment - 09/15/19 1240    Subjective  Feeling good overall. Reports some stiffness following not being able to make PT appt last weekd.t illness. Compliance with HEP.    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 Shoulder pain started 3 months ago, no mechanism of injury, was getting better with heat. Reports she had pain for a about a month, and for the past 2 months the shoulder has been stiff. Has associated neck stiffness since her surgery last year . 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 Has trouble washing her hair, cannot tie her shoes d/t reaching and hand dysfunction, cannot sit up and hold her head up without neck discomfort at her neck, cannot dress herself normally. No pain in shoulder/neck over the past week, describes discomfort/stiffness. 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    Limitations  Sitting;Writing;House hold  activities;Lifting    How long can you sit comfortably?  30    How long can you stand comfortably?  unlimited    How long can you walk comfortably?  unlimited    Diagnostic tests  None on shoulder    Patient Stated Goals  Increase shoulder strength and mobility    Pain Onset  More than a month ago       Ther-Ex  -AA/ROM scapular protraction/retraction and thoracic spine rotation on NuStep; Level2, seat7, SPM60s, Arms level 11, 5 minutes; cuing for cervical ext to neutral able to hold somewhat - AROM shoulder flex insupineBUE 1# 3x 10 60-120 d/t difficulty with initiation from 0d, good success in this ROM, improvement from last session -Bilat AROM shoulder abdin supineto 130 x10; with 1# 3x 10 with cuing to attempt to keep UEs on mat table with scapular retraction with decent carry over - Seated shoulder AROM flex to 80-90d with AAROM with opposite UE to about 120d with RUE lowering ind with eccentric control 2x 12 -Standing row 3x 10 GTB with min cuing for cervical retraction + ext with good carry over HEP update to reflect these exercises and review of cervical ext with SNAG and pulleys for shoulder ROM [id number]  Goal review with patient understanding progress made through motion and strength with therex compliance. Education on continuing visual field adaptations to continue cervical  ROM progress  Manual therapy:to reduce pain and tissue tension, improve range of motion, neuromodulation, in order to promote improved ability to complete functional activities. -PROM shoulder in all directions into pain free/cusp of pain ROM 5-10sec holds -Passive cervical rotation and lateral bending to L , and ext off edge of mat table with supportsustained holds(able to get upper cervical spine off mat table) 10x 3 second holds                          PT Education - 09/15/19 1241    Education Details  therex form; HEP update; reassessment    Person(s) Educated   Patient    Methods  Explanation;Demonstration;Tactile cues;Verbal cues;Handout    Comprehension  Verbal cues required;Returned demonstration;Verbalized understanding;Tactile cues required       PT Short Term Goals - 09/15/19 1119      PT SHORT TERM GOAL #1   Title  Pt will be independent with HEP in order to improve strength and decrease pain in order to improve pain-free function at home and work.    Baseline  08/04/19 Completing HEP regularly withotu question or concern    Time  4    Period  Weeks    Status  Achieved        PT Long Term Goals - 09/15/19 1120      PT LONG TERM GOAL #1   Title  Pt will decrease quick DASH score by at least 8% in order to demonstrate clinically significant reduction in disability.    Baseline  06/11/19 56.8%; 08/04/19 34%    Time  8    Period  Weeks    Status  Achieved      PT LONG TERM GOAL #2   Title  Patient will demonstrate full cervical ROM in order to drive safely    Baseline  06/11/19 R/L rotation 55d/10d lateral bending 30d/15d extension 0d; 09/15/19 AROM/PROM flex: 100d/100d ext: -7d/0d  L rotation: 30d/35d  R rotation:  40d/50d L sidebending: 41d/36dd R sidebending 52d/42d    Time  8    Period  Weeks    Status  On-going      PT LONG TERM GOAL #3   Title  Patient will demonstrate full R shoulder flex and IR ROM in order to complete ADLs    Baseline  06/11/19 see eval; 11/4 A/ROM flexion 95 degrees, (P/ROM much more) 09/15/19 PROM flexion 140d AROM 100d IR PROM 84d AROM to C4    Time  8    Period  Weeks    Status  Revised            Plan - 09/15/19 1243    Clinical Impression Statement  PT reassessed goals this session where patient is continuing to make slow and steady progress with cervical and R shoulder ROM with better functional ability and improved tolerance at completion of ADLs (reports she can wash her hair and style it with RUE). Although patient is making progress, her remaining deficits in ROM (AROM > PROM), and  strength continue to inhibit full participation in ADLs, especially overhead activity. Pt will continue to benefit from skilled PT services to address these impairments in order to return patient to optimal level of function and quality of life. Patient able to complete all therex with proper technique following min cuing and demonstrates and verbalizes understanding of HEP and POC update.    Personal Factors and Comorbidities  Age;Behavior Pattern;Comorbidity 1;Comorbidity 2;Past/Current Experience;Fitness;Sex;Time  since onset of injury/illness/exacerbation    Comorbidities  scleroderma, cancer    Examination-Activity Limitations  Reach Overhead;Self Feeding;Dressing;Sleep;Lift    Examination-Participation Restrictions  Laundry;Cleaning;Community Activity;Driving    Stability/Clinical Decision Making  Evolving/Moderate complexity    Clinical Decision Making  Moderate    Rehab Potential  Good    PT Frequency  2x / week    PT Duration  8 weeks    PT Treatment/Interventions  ADLs/Self Care Home Management;Aquatic Therapy;Moist Heat;Traction;Ultrasound;Cryotherapy;Parrafin;Functional mobility training;Neuromuscular re-education;Taping;Dry needling;Passive range of motion;Joint Manipulations;Spinal Manipulations;Patient/family education;Therapeutic exercise;Manual techniques;Therapeutic activities;Electrical Stimulation;Fluidtherapy;Iontophoresis 4mg /ml Dexamethasone    PT Next Visit Plan  Increase cervical ROM; postural restoration    Consulted and Agree with Plan of Care  Patient       Patient will benefit from skilled therapeutic intervention in order to improve the following deficits and impairments:  Decreased endurance, Decreased mobility, Pain, Postural dysfunction, Impaired UE functional use, Impaired flexibility, Increased fascial restricitons, Decreased strength, Decreased activity tolerance, Decreased range of motion, Decreased scar mobility, Impaired tone, Improper body mechanics,  Hypomobility  Visit Diagnosis: Muscle weakness (generalized)  Stiffness of right shoulder, not elsewhere classified  Cervicalgia  Pain in right hand  Stiffness of right hand, not elsewhere classified     Problem List There are no problems to display for this patient.  Shelton Silvas PT, DPT Shelton Silvas 09/15/2019, 12:52 PM  [facility] [address], [place], [address] Phone: [phone]   Fax:  [phone]  Name: OLUWAFUNMILAYO WHIPKEY MRN: [id number] Date of Birth: September 06, 1966

## 2019-09-22 ENCOUNTER — Other Ambulatory Visit: Payer: Self-pay

## 2019-09-22 ENCOUNTER — Encounter: Payer: Self-pay | Admitting: Physical Therapy

## 2019-09-22 ENCOUNTER — Ambulatory Visit: Payer: BC Managed Care – PPO | Admitting: Physical Therapy

## 2019-09-22 DIAGNOSIS — M542 Cervicalgia: Secondary | ICD-10-CM

## 2019-09-22 DIAGNOSIS — M6281 Muscle weakness (generalized): Secondary | ICD-10-CM

## 2019-09-22 DIAGNOSIS — M25611 Stiffness of right shoulder, not elsewhere classified: Secondary | ICD-10-CM

## 2019-09-22 NOTE — Therapy (Signed)
[facility] [address] [place], [address] Phone: [phone]   Fax:  [phone]  Physical Therapy Treatment  Patient Details  Name: Whitney Mclean MRN: [id number] Date of Birth: 08-03-1966 No data recorded  Encounter Date: 09/22/2019  PT End of Session - 09/22/19 1125    Visit Number  21    Number of Visits  45    Date for PT Re-Evaluation  11/11/19    PT Start Time  1117    PT Stop Time  1200    PT Time Calculation (min)  43 min    Activity Tolerance  Patient tolerated treatment well;No increased pain    Behavior During Therapy  WFL for tasks assessed/performed       Past Medical History:  Diagnosis Date  . Oral cancer [facility])     Past Surgical History:  Procedure Laterality Date  . ABDOMINAL HYSTERECTOMY      There were no vitals filed for this visit.  Subjective Assessment - 09/22/19 1123    Subjective  Feeling good overall. Compliance with HEP, some increased stiffness following absence from PT d/t  holiday.    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 Shoulder pain started 3 months ago, no mechanism of injury, was getting better with heat. Reports she had pain for a about a month, and for the past 2 months the shoulder has been stiff. Has associated neck stiffness since her surgery last year . 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 Has trouble washing her hair, cannot tie her shoes d/t reaching and hand dysfunction, cannot sit up and hold her head up without neck discomfort at her neck, cannot dress herself normally. No pain in shoulder/neck over the past week, describes discomfort/stiffness. 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    Limitations  Sitting;Writing;House hold activities;Lifting    How long can you sit comfortably?  30    How long can you stand comfortably?  unlimited    How long can you walk comfortably?  unlimited    Diagnostic tests  None on shoulder    Patient Stated Goals  Increase shoulder strength and mobility    Pain Onset  More than a month ago       Ther-Ex  -AA/ROM scapular protraction/retraction and thoracic spine rotation on NuStep; Level2, seat7, SPM60s, Arms level 10, 5 minutes; cuing for cervical ext to neutral able to hold somewhat - TRX walkouts x10 ABD and flex with 5sec holds - Lat pulldown 10# x10; 15# 2x10 good AAROM in eccentric and concentric lat activiation, good carry over of head positioning close to neutral with cuing to touch bar to sternum - OMEGA chest/overhead press 10# 3x 8 on second setting with good carry over of cuing  - AROM shoulder flex insupineBUE 1# 3x 10 30-120 d/t difficulty with initiation from 0d, good success in this ROM, improvement from last session    Manual therapy:to reduce pain and tissue tension, improve range of motion, neuromodulation, in order to promote improved ability to complete functional activities. -PROM shoulder in all directions into pain free/cusp of pain ROM 5-10sec holds -Passive cervical rotation and lateral bending to L , and ext off edge of mat table with supportsustained holds(able to get  upper cervical spine off mat table) 10x 3 second holds -MFR to R UT, SCM, cervical flexors, and pec minor.                         PT Education - 09/22/19 1125    Education Details  therex form    Person(s) Educated  Patient    Methods  Explanation;Demonstration;Verbal cues    Comprehension  Verbalized understanding;Verbal cues required       PT Short Term Goals - 09/15/19 1119      PT SHORT TERM GOAL #1   Title  Pt will be independent with HEP in order to improve strength and decrease pain in order to improve pain-free function at home and work.    Baseline  08/04/19  Completing HEP regularly withotu question or concern    Time  4    Period  Weeks    Status  Achieved        PT Long Term Goals - 09/15/19 1120      PT LONG TERM GOAL #1   Title  Pt will decrease quick DASH score by at least 8% in order to demonstrate clinically significant reduction in disability.    Baseline  06/11/19 56.8%; 08/04/19 34%    Time  8    Period  Weeks    Status  Achieved      PT LONG TERM GOAL #2   Title  Patient will demonstrate full cervical ROM in order to drive safely    Baseline  06/11/19 R/L rotation 55d/10d lateral bending 30d/15d extension 0d; 09/15/19 AROM/PROM flex: 100d/100d ext: -7d/0d  L rotation: 30d/35d  R rotation:  40d/50d L sidebending: 41d/36dd R sidebending 52d/42d    Time  8    Period  Weeks    Status  On-going      PT LONG TERM GOAL #3   Title  Patient will demonstrate full R shoulder flex and IR ROM in order to complete ADLs    Baseline  06/11/19 see eval; 11/4 A/ROM flexion 95 degrees, (P/ROM much more) 09/15/19 PROM flexion 140d AROM 100d IR PROM 84d AROM to C4    Time  8    Period  Weeks    Status  Revised            Plan - 09/22/19 1205    Clinical Impression Statement  PT continued therex progress with some increased time spent on manual techniques with good motion this session. Patient is able to complete all therex with min cuing or technique. Patient motivated throughout session. PT will continue progression as able.    Personal Factors and Comorbidities  Age;Behavior Pattern;Comorbidity 1;Comorbidity 2;Past/Current Experience;Fitness;Sex;Time since onset of injury/illness/exacerbation    Comorbidities  scleroderma, cancer    Examination-Activity Limitations  Reach Overhead;Self Feeding;Dressing;Sleep;Lift    Examination-Participation Restrictions  Laundry;Cleaning;Community Activity;Driving    Stability/Clinical Decision Making  Evolving/Moderate complexity    Clinical Decision Making  Moderate    Rehab Potential  Good    PT  Frequency  2x / week    PT Duration  8 weeks    PT Treatment/Interventions  ADLs/Self Care Home Management;Aquatic Therapy;Moist Heat;Traction;Ultrasound;Cryotherapy;Parrafin;Functional mobility training;Neuromuscular re-education;Taping;Dry needling;Passive range of motion;Joint Manipulations;Spinal Manipulations;Patient/family education;Therapeutic exercise;Manual techniques;Therapeutic activities;Electrical Stimulation;Fluidtherapy;Iontophoresis 4mg /ml Dexamethasone    PT Next Visit Plan  Increase cervical ROM; postural restoration    Consulted and Agree with Plan of Care  Patient       Patient will benefit from skilled therapeutic intervention  in order to improve the following deficits and impairments:  Decreased endurance, Decreased mobility, Pain, Postural dysfunction, Impaired UE functional use, Impaired flexibility, Increased fascial restricitons, Decreased strength, Decreased activity tolerance, Decreased range of motion, Decreased scar mobility, Impaired tone, Improper body mechanics, Hypomobility  Visit Diagnosis: Muscle weakness (generalized)  Stiffness of right shoulder, not elsewhere classified  Cervicalgia     Problem List There are no problems to display for this patient.  Shelton Silvas PT, DPT Shelton Silvas 09/22/2019, 12:06 PM  [facility] [address], [place], [address] Phone: [phone]   Fax:  [phone]  Name: CAMREY CAIRNES MRN: [id number] Date of Birth: 06/29/1966

## 2019-09-23 ENCOUNTER — Ambulatory Visit
Admission: RE | Admit: 2019-09-23 | Discharge: 2019-09-23 | Disposition: A | Discharge status: Home / Self Care | Payer: BC Managed Care – PPO | Source: Ambulatory Visit | Attending: Internal Medicine | Admitting: Internal Medicine

## 2019-09-23 DIAGNOSIS — Z1231 Encounter for screening mammogram for malignant neoplasm of breast: Secondary | ICD-10-CM

## 2019-09-29 ENCOUNTER — Ambulatory Visit: Payer: BC Managed Care – PPO | Attending: Internal Medicine | Admitting: Physical Therapy

## 2019-09-29 ENCOUNTER — Other Ambulatory Visit: Payer: Self-pay

## 2019-09-29 ENCOUNTER — Encounter: Payer: Self-pay | Admitting: Physical Therapy

## 2019-09-29 DIAGNOSIS — M25611 Stiffness of right shoulder, not elsewhere classified: Secondary | ICD-10-CM | POA: Insufficient documentation

## 2019-09-29 DIAGNOSIS — M542 Cervicalgia: Secondary | ICD-10-CM | POA: Diagnosis present

## 2019-09-29 DIAGNOSIS — M6281 Muscle weakness (generalized): Secondary | ICD-10-CM | POA: Diagnosis present

## 2019-09-29 NOTE — Therapy (Signed)
[facility] [address] [place], [address] Phone: [phone]   Fax:  [phone]  Physical Therapy Treatment  Patient Details  Name: Whitney Mclean MRN: [id number] Date of Birth: Jul 25, 1966 No data recorded  Encounter Date: 09/29/2019  PT End of Session - 09/29/19 1131    Visit Number  22    Number of Visits  45    Date for PT Re-Evaluation  11/11/19    PT Start Time  1117    PT Stop Time  1200    PT Time Calculation (min)  43 min    Activity Tolerance  Patient tolerated treatment well;No increased pain    Behavior During Therapy  WFL for tasks assessed/performed       Past Medical History:  Diagnosis Date  . Oral cancer [facility])     Past Surgical History:  Procedure Laterality Date  . ABDOMINAL HYSTERECTOMY      There were no vitals filed for this visit.  Subjective Assessment - 09/29/19 1119    Subjective  Continuing to report increased function with bathing/hair washing with RUE.    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 Shoulder pain started 3 months ago, no mechanism of injury, was getting better with heat. Reports she had pain for a about a month, and for the past 2 months the shoulder has been stiff. Has associated neck stiffness since her surgery last year . 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 Has trouble washing her hair, cannot tie her shoes d/t reaching and hand dysfunction, cannot sit up and hold her head up without neck discomfort at her neck, cannot dress herself normally. No pain in shoulder/neck over the past week, describes discomfort/stiffness. 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    Limitations  Sitting;Writing;House hold activities;Lifting    How long can you sit comfortably?   30    How long can you stand comfortably?  unlimited    How long can you walk comfortably?  unlimited    Diagnostic tests  None on shoulder    Patient Stated Goals  Increase shoulder strength and mobility    Pain Onset  More than a month ago        Ther-Ex -AA/ROM scapular protraction/retraction and thoracic spine rotation on NuStep; Level2/3 (2 mins each), seat7, SPM60s, Arms level 10,67minutes; cuing for cervical ext to neutral able to hold somewhat - TRX walkouts x10 ABD and flex with 10sec holds -Lat pulldown 15# 3x10 good AAROM in eccentric and concentric lat activiation, good carry over of head positioning close to neutral with cuing to touch bar to sternum - Lawn chair chest press (about 45d) BW 3x 10/8/8 with progression to 45d, initially began with 1# DB, unable to complete with success - AROM shoulder flex insupineBUE 1# 3x 1040-120 d/t difficulty with initiation from 0d, good success in this ROM, improvement from last session Placing 1# DB 33.5in shelf to 48.5in shelf 2x 6 with heavy cuing and demo needed during   Manual therapy:to reduce pain and tissue tension, improve range of motion, neuromodulation, in order to promote improved ability to complete functional activities. -Passive cervical rotation and lateral bending to L , and ext off edge of mat table with supportsustained holds(able  to get upper cervical spine off mat table) 10x 3 second holds -MFR to R UT, SCM, cervical flexors, and pec minor.                          PT Education - 09/29/19 1121    Education Details  therex form    Person(s) Educated  Patient    Methods  Explanation;Demonstration;Verbal cues    Comprehension  Verbalized understanding;Returned demonstration;Verbal cues required       PT Short Term Goals - 09/15/19 1119      PT SHORT TERM GOAL #1   Title  Pt will be independent with HEP in order to improve strength and decrease pain in order to improve  pain-free function at home and work.    Baseline  08/04/19 Completing HEP regularly withotu question or concern    Time  4    Period  Weeks    Status  Achieved        PT Long Term Goals - 09/15/19 1120      PT LONG TERM GOAL #1   Title  Pt will decrease quick DASH score by at least 8% in order to demonstrate clinically significant reduction in disability.    Baseline  06/11/19 56.8%; 08/04/19 34%    Time  8    Period  Weeks    Status  Achieved      PT LONG TERM GOAL #2   Title  Patient will demonstrate full cervical ROM in order to drive safely    Baseline  06/11/19 R/L rotation 55d/10d lateral bending 30d/15d extension 0d; 09/15/19 AROM/PROM flex: 100d/100d ext: -7d/0d  L rotation: 30d/35d  R rotation:  40d/50d L sidebending: 41d/36dd R sidebending 52d/42d    Time  8    Period  Weeks    Status  On-going      PT LONG TERM GOAL #3   Title  Patient will demonstrate full R shoulder flex and IR ROM in order to complete ADLs    Baseline  06/11/19 see eval; 11/4 A/ROM flexion 95 degrees, (P/ROM much more) 09/15/19 PROM flexion 140d AROM 100d IR PROM 84d AROM to C4    Time  8    Period  Weeks    Status  Revised            Plan - 09/29/19 1210    Clinical Impression Statement  PT continued therex progression for functional movements with AROM and strengthening with good carry over. Patient is demonstrating increased motor control between sessions. Patient very motivated throughout session, with good effort through all therex and compliance with demo and cuing for proper technique. PT will continue progression as able.    Personal Factors and Comorbidities  Age;Behavior Pattern;Comorbidity 1;Comorbidity 2;Past/Current Experience;Fitness;Sex;Time since onset of injury/illness/exacerbation    Comorbidities  scleroderma, cancer    Examination-Activity Limitations  Reach Overhead;Self Feeding;Dressing;Sleep;Lift    Examination-Participation Restrictions  Laundry;Cleaning;Community  Activity;Driving    Stability/Clinical Decision Making  Evolving/Moderate complexity    Clinical Decision Making  Moderate    Rehab Potential  Good    PT Frequency  2x / week    PT Duration  8 weeks    PT Treatment/Interventions  ADLs/Self Care Home Management;Aquatic Therapy;Moist Heat;Traction;Ultrasound;Cryotherapy;Parrafin;Functional mobility training;Neuromuscular re-education;Taping;Dry needling;Passive range of motion;Joint Manipulations;Spinal Manipulations;Patient/family education;Therapeutic exercise;Manual techniques;Therapeutic activities;Electrical Stimulation;Fluidtherapy;Iontophoresis 4mg /ml Dexamethasone    PT Next Visit Plan  Increase cervical ROM; postural restoration    Consulted and Agree with Plan of Care  Patient       Patient will benefit from skilled therapeutic intervention in order to improve the following deficits and impairments:  Decreased endurance, Decreased mobility, Pain, Postural dysfunction, Impaired UE functional use, Impaired flexibility, Increased fascial restricitons, Decreased strength, Decreased activity tolerance, Decreased range of motion, Decreased scar mobility, Impaired tone, Improper body mechanics, Hypomobility  Visit Diagnosis: Muscle weakness (generalized)  Stiffness of right shoulder, not elsewhere classified  Cervicalgia     Problem List There are no problems to display for this patient.  Shelton Silvas PT, DPT Shelton Silvas 09/29/2019, 12:31 PM  [facility] [address] [place], [address] Phone: [phone]   Fax:  [phone]  Name: CLARICE SCHERFF MRN: [id number] Date of Birth: July 26, 1966

## 2019-10-05 ENCOUNTER — Ambulatory Visit: Payer: BC Managed Care – PPO | Admitting: Physical Therapy

## 2019-10-06 ENCOUNTER — Other Ambulatory Visit: Payer: Self-pay

## 2019-10-06 ENCOUNTER — Ambulatory Visit: Payer: BC Managed Care – PPO | Admitting: Physical Therapy

## 2019-10-06 ENCOUNTER — Encounter: Payer: Self-pay | Admitting: Physical Therapy

## 2019-10-06 VITALS — BP 128/82

## 2019-10-06 DIAGNOSIS — M6281 Muscle weakness (generalized): Secondary | ICD-10-CM

## 2019-10-06 DIAGNOSIS — M25611 Stiffness of right shoulder, not elsewhere classified: Secondary | ICD-10-CM

## 2019-10-06 DIAGNOSIS — M542 Cervicalgia: Secondary | ICD-10-CM

## 2019-10-06 NOTE — Therapy (Addendum)
[facility] [address] [place], [address] Phone: [phone]   Fax:  [phone]  Physical Therapy Treatment  Patient Details  Name: Whitney Mclean MRN: [id number] Date of Birth: 01-29-1966 No data recorded  Encounter Date: 10/06/2019  PT End of Session - 10/06/19 1040    Visit Number  23    Number of Visits  45    Date for PT Re-Evaluation  11/11/19    PT Start Time  0955    PT Stop Time  [id number]    PT Time Calculation (min)  45 min    Activity Tolerance  Patient tolerated treatment well;No increased pain    Behavior During Therapy  WFL for tasks assessed/performed       Past Medical History:  Diagnosis Date  . Oral cancer [facility])     Past Surgical History:  Procedure Laterality Date  . ABDOMINAL HYSTERECTOMY      Vitals:   10/06/19 1000  BP: 128/82    Subjective Assessment - 10/06/19 0956    Subjective  Pt reports fall out of bed after walking to bathroom during the night, unknown cause with some L shoulder 6/10 pain, posterior head soreness, and a wound on L elbow from fall. Pt took tylenol to ease pain and currently reports 0/10 pain. Pt does not report dizziness, diploplia, or loss of consciousness during fall but some nausea.    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 Shoulder pain started 3 months ago, no mechanism of injury, was getting better with heat. Reports she had pain for a about a month, and for the past 2 months the shoulder has been stiff. Has associated neck stiffness since her surgery last year . 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 Has trouble washing her hair, cannot tie her shoes d/t reaching and hand dysfunction, cannot sit up and hold her head up without neck discomfort at her neck, cannot  dress herself normally. No pain in shoulder/neck over the past week, describes discomfort/stiffness. 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    Limitations  Sitting;Writing;House hold activities;Lifting    How long can you sit comfortably?  30    How long can you stand comfortably?  unlimited    How long can you walk comfortably?  unlimited    Diagnostic tests  None on shoulder    Patient Stated Goals  Increase shoulder strength and mobility         THEREX -AA/ROM scapular protraction/retraction and thoracic spine rotation on NuStep; Level2/3 (2 mins each), seat7, SPM60s, Arms level 10,42minutes; cuing for cervical ext to neutral able to hold somewhat -T spine extension on foam roller with AROM shoulder extension with 1# 1x6; no weight 1x8;1x6. Some difficulty initiating movement  -Lat pull down 15# 1x10; 20# 2x6; pt reports some pain in L elbow with cueing for cervical extension -Lawn chair punches with 1# 3x8 30 deg incline on blue foam pad -Standing cable rows 10# 2x10   Manual Therapy to reduce pain and tissue tension, improve range of motion, neuromodulation, in order to promote improved ability to complete functional activities. -Passive cervical rotation and lateral bending to L , and ext off edge of mat table with supportsustained holds(able to get upper  cervical spine off mat table) 10x 3 second holds -MFR to R UT, SCM, cervical flexors, and pec minor.                       PT Education - 10/06/19 1040    Education Details  therex form, suggested increasing water intake    Methods  Demonstration;Explanation    Comprehension  Returned demonstration       PT Short Term Goals - 09/15/19 1119      PT SHORT TERM GOAL #1   Title  Pt will be independent with HEP in order to improve strength and decrease pain in order to improve pain-free function at home and work.    Baseline  08/04/19 Completing HEP regularly withotu question or  concern    Time  4    Period  Weeks    Status  Achieved        PT Long Term Goals - 09/15/19 1120      PT LONG TERM GOAL #1   Title  Pt will decrease quick DASH score by at least 8% in order to demonstrate clinically significant reduction in disability.    Baseline  06/11/19 56.8%; 08/04/19 34%    Time  8    Period  Weeks    Status  Achieved      PT LONG TERM GOAL #2   Title  Patient will demonstrate full cervical ROM in order to drive safely    Baseline  06/11/19 R/L rotation 55d/10d lateral bending 30d/15d extension 0d; 09/15/19 AROM/PROM flex: 100d/100d ext: -7d/0d  L rotation: 30d/35d  R rotation:  40d/50d L sidebending: 41d/36dd R sidebending 52d/42d    Time  8    Period  Weeks    Status  On-going      PT LONG TERM GOAL #3   Title  Patient will demonstrate full R shoulder flex and IR ROM in order to complete ADLs    Baseline  06/11/19 see eval; 11/4 A/ROM flexion 95 degrees, (P/ROM much more) 09/15/19 PROM flexion 140d AROM 100d IR PROM 84d AROM to C4    Time  8    Period  Weeks    Status  Revised            Plan - 10/06/19 1041    Clinical Impression Statement  Pt responded well to treatment despite report of fall from previous night. PT progressed strengthening movments and AROM exercises in order to maintain current function. PT took vitals at start of session, BP slightly elevated 128/82. PT monitored patient's response to therex, with no adverse events, and advised patient to drink water (as patient does report some dehyration) and seek medical attention should any further symptoms arise or consist; patient verbalizes understanding.  Pt was motivated and willing to challenge herself. PT observes good carryover with motor control between session and will continue with progressions.    Personal Factors and Comorbidities  Age;Behavior Pattern;Comorbidity 1;Comorbidity 2;Past/Current Experience;Fitness;Sex;Time since onset of injury/illness/exacerbation    Comorbidities   scleroderma, cancer    Examination-Activity Limitations  Reach Overhead;Self Feeding;Dressing;Sleep;Lift    Examination-Participation Restrictions  Laundry;Cleaning;Community Activity;Driving    Stability/Clinical Decision Making  Evolving/Moderate complexity    Clinical Decision Making  Moderate    Rehab Potential  Good    PT Frequency  2x / week    PT Duration  8 weeks    PT Treatment/Interventions  ADLs/Self Care Home Management;Aquatic Therapy;Moist Heat;Traction;Ultrasound;Cryotherapy;Parrafin;Functional mobility training;Neuromuscular re-education;Taping;Dry needling;Passive range of motion;Joint  Manipulations;Spinal Manipulations;Patient/family education;Therapeutic exercise;Manual techniques;Therapeutic activities;Electrical Stimulation;Fluidtherapy;Iontophoresis 4mg /ml Dexamethasone    PT Next Visit Plan  Increase cervical ROM; postural restoration    Consulted and Agree with Plan of Care  Patient       Patient will benefit from skilled therapeutic intervention in order to improve the following deficits and impairments:  Decreased endurance, Decreased mobility, Pain, Postural dysfunction, Impaired UE functional use, Impaired flexibility, Increased fascial restricitons, Decreased strength, Decreased activity tolerance, Decreased range of motion, Decreased scar mobility, Impaired tone, Improper body mechanics, Hypomobility  Visit Diagnosis: Muscle weakness (generalized)  Stiffness of right shoulder, not elsewhere classified  Cervicalgia     Problem List There are no problems to display for this patient.  Shelton Silvas PT, DPT Shelton Silvas 10/06/2019, 10:57 AM  [facility] [address] [place], [address] Phone: [phone]   Fax:  [phone]  Name: Whitney Mclean MRN: [id number] Date of Birth: 05-01-1966

## 2019-10-12 ENCOUNTER — Ambulatory Visit: Payer: BC Managed Care – PPO | Admitting: Physical Therapy

## 2019-10-19 ENCOUNTER — Ambulatory Visit: Payer: BC Managed Care – PPO | Admitting: Physical Therapy

## 2019-10-27 ENCOUNTER — Ambulatory Visit: Payer: BC Managed Care – PPO | Admitting: Physical Therapy

## 2019-11-03 ENCOUNTER — Other Ambulatory Visit: Payer: Self-pay

## 2019-11-03 ENCOUNTER — Ambulatory Visit: Payer: BC Managed Care – PPO | Attending: Internal Medicine | Admitting: Physical Therapy

## 2019-11-03 ENCOUNTER — Encounter: Payer: Self-pay | Admitting: Physical Therapy

## 2019-11-03 DIAGNOSIS — M542 Cervicalgia: Secondary | ICD-10-CM | POA: Insufficient documentation

## 2019-11-03 DIAGNOSIS — M25611 Stiffness of right shoulder, not elsewhere classified: Secondary | ICD-10-CM | POA: Insufficient documentation

## 2019-11-03 DIAGNOSIS — M6281 Muscle weakness (generalized): Secondary | ICD-10-CM | POA: Diagnosis not present

## 2019-11-03 NOTE — Therapy (Signed)
[facility] [address], [place], [address] Phone: [phone]   Fax:  [phone]  Physical Therapy Treatment  Patient Details  Name: Whitney Mclean MRN: [id number] Date of Birth: August 30, 1966 No data recorded  Encounter Date: 11/03/2019  PT End of Session - 11/03/19 1125    Visit Number  24    Number of Visits  45    Date for PT Re-Evaluation  11/11/19    PT Start Time  1102    PT Stop Time  1141    PT Time Calculation (min)  39 min    Activity Tolerance  Patient tolerated treatment well;No increased pain    Behavior During Therapy  WFL for tasks assessed/performed       Past Medical History:  Diagnosis Date  . Oral cancer [facility])     Past Surgical History:  Procedure Laterality Date  . ABDOMINAL HYSTERECTOMY      There were no vitals filed for this visit.  Subjective Assessment - 11/03/19 1105    Subjective  Patient returns to PT following absence over 2 weeks d/t increased dizziness and falls. PCP is following her for medication changes. Last fall 2 weeks ago, still does not feel comfortable driving, but she does seem to think this is getting somewhat better.    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 Shoulder pain started 3 months ago, no mechanism of injury, was getting better with heat. Reports she had pain for a about a month, and for the past 2 months the shoulder has been stiff. Has associated neck stiffness since her surgery last year . 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 Has trouble washing her hair, cannot tie her shoes d/t reaching and hand dysfunction, cannot sit up and hold her head up without neck discomfort at her neck, cannot dress herself normally. No pain in shoulder/neck over the past week,  describes discomfort/stiffness. 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    Limitations  Sitting;Writing;House hold activities;Lifting    How long can you sit comfortably?  30    How long can you stand comfortably?  unlimited    How long can you walk comfortably?  unlimited    Diagnostic tests  None on shoulder    Patient Stated Goals  Increase shoulder strength and mobility    Pain Onset  More than a month ago       THEREX -AA/ROM scapular protraction/retraction and thoracic spine rotation on NuStep; Level3 53mins, seat7, SPM60s, Arms level 9; cuing for cervical ext to neutral able to hold somewhat - Supine flex 1# DB 3x 10/9/8 aprrox 60-140d flex with good fatigue noted at end of reps - Supine abd 1# DB approx 0-100d 3x 10 with min cuing for scapular retraction with good carry over   Manual Therapy to reduce pain and tissue tension, improve range of motion, neuromodulation, in order to promote improved ability to complete functional activities. -Passive cervical rotation and lateral bending to L , and ext off edge of mat table with supportsustained holds(able to get upper cervical spine off mat table) Increased time spent for progression  -MFR to R UT, SCM, cervical flexors, and pec minor.  PT Education - 11/03/19 1125    Education Details  therex form, manual techniques    Person(s) Educated  Patient    Methods  Explanation;Demonstration;Verbal cues    Comprehension  Verbalized understanding;Returned demonstration;Verbal cues required       PT Short Term Goals - 09/15/19 1119      PT SHORT TERM GOAL #1   Title  Pt will be independent with HEP in order to improve strength and decrease pain in order to improve pain-free function at home and work.    Baseline  08/04/19 Completing HEP regularly withotu question or concern    Time  4    Period  Weeks    Status  Achieved        PT Long Term Goals - 09/15/19 1120       PT LONG TERM GOAL #1   Title  Pt will decrease quick DASH score by at least 8% in order to demonstrate clinically significant reduction in disability.    Baseline  06/11/19 56.8%; 08/04/19 34%    Time  8    Period  Weeks    Status  Achieved      PT LONG TERM GOAL #2   Title  Patient will demonstrate full cervical ROM in order to drive safely    Baseline  06/11/19 R/L rotation 55d/10d lateral bending 30d/15d extension 0d; 09/15/19 AROM/PROM flex: 100d/100d ext: -7d/0d  L rotation: 30d/35d  R rotation:  40d/50d L sidebending: 41d/36dd R sidebending 52d/42d    Time  8    Period  Weeks    Status  On-going      PT LONG TERM GOAL #3   Title  Patient will demonstrate full R shoulder flex and IR ROM in order to complete ADLs    Baseline  06/11/19 see eval; 11/4 A/ROM flexion 95 degrees, (P/ROM much more) 09/15/19 PROM flexion 140d AROM 100d IR PROM 84d AROM to C4    Time  8    Period  Weeks    Status  Revised            Plan - 11/03/19 1413    Clinical Impression Statement  Pt returns to PT following 4 week absence d/t dizziness with new medications, which she is being followed for by PCP. Patient with decent cervical motion considering prolonged absence, mostly seems to be d/t HEP compliance. PT does utlize increased manual tecniques for ext past neutral and for nuetral rotation/lateral bending, but is able to passively acheive this, with good carry over to active postural adjustments at the end of session. Patient able to complete therex for increased shoulder/scapular mobility and strength with good motivation and compliance of cuing as well.    Personal Factors and Comorbidities  Age;Behavior Pattern;Comorbidity 1;Comorbidity 2;Past/Current Experience;Fitness;Sex;Time since onset of injury/illness/exacerbation    Comorbidities  scleroderma, cancer    Examination-Activity Limitations  Reach Overhead;Self Feeding;Dressing;Sleep;Lift    Examination-Participation Restrictions   Laundry;Cleaning;Community Activity;Driving    Stability/Clinical Decision Making  Evolving/Moderate complexity    Clinical Decision Making  Moderate    Rehab Potential  Good    PT Frequency  2x / week    PT Duration  8 weeks    PT Treatment/Interventions  ADLs/Self Care Home Management;Aquatic Therapy;Moist Heat;Traction;Ultrasound;Cryotherapy;Parrafin;Functional mobility training;Neuromuscular re-education;Taping;Dry needling;Passive range of motion;Joint Manipulations;Spinal Manipulations;Patient/family education;Therapeutic exercise;Manual techniques;Therapeutic activities;Electrical Stimulation;Fluidtherapy;Iontophoresis 4mg /ml Dexamethasone    PT Next Visit Plan  Increase cervical ROM; postural restoration    Consulted and Agree with Plan of Care  Patient  Patient will benefit from skilled therapeutic intervention in order to improve the following deficits and impairments:  Decreased endurance, Decreased mobility, Pain, Postural dysfunction, Impaired UE functional use, Impaired flexibility, Increased fascial restricitons, Decreased strength, Decreased activity tolerance, Decreased range of motion, Decreased scar mobility, Impaired tone, Improper body mechanics, Hypomobility  Visit Diagnosis: Muscle weakness (generalized)  Stiffness of right shoulder, not elsewhere classified  Cervicalgia     Problem List There are no problems to display for this patient.  Shelton Silvas PT, DPT Shelton Silvas 11/03/2019, 2:41 PM  [facility] [address], [place], [address] Phone: [phone]   Fax:  [phone]  Name: Whitney Mclean MRN: [id number] Date of Birth: [birth year]

## 2019-11-10 ENCOUNTER — Emergency Department: Payer: BC Managed Care – PPO

## 2019-11-10 ENCOUNTER — Other Ambulatory Visit: Payer: Self-pay

## 2019-11-10 ENCOUNTER — Emergency Department
Admission: EM | Admit: 2019-11-10 | Discharge: 2019-11-10 | Disposition: A | Discharge status: Home / Self Care | Payer: BC Managed Care – PPO | Attending: Emergency Medicine | Admitting: Emergency Medicine

## 2019-11-10 ENCOUNTER — Ambulatory Visit: Payer: BC Managed Care – PPO | Admitting: Physical Therapy

## 2019-11-10 ENCOUNTER — Encounter: Payer: Self-pay | Admitting: Emergency Medicine

## 2019-11-10 DIAGNOSIS — R112 Nausea with vomiting, unspecified: Secondary | ICD-10-CM | POA: Diagnosis not present

## 2019-11-10 DIAGNOSIS — S060X0A Concussion without loss of consciousness, initial encounter: Secondary | ICD-10-CM | POA: Diagnosis not present

## 2019-11-10 DIAGNOSIS — S299XXA Unspecified injury of thorax, initial encounter: Secondary | ICD-10-CM | POA: Insufficient documentation

## 2019-11-10 DIAGNOSIS — W19XXXA Unspecified fall, initial encounter: Secondary | ICD-10-CM | POA: Diagnosis not present

## 2019-11-10 DIAGNOSIS — M349 Systemic sclerosis, unspecified: Secondary | ICD-10-CM | POA: Insufficient documentation

## 2019-11-10 DIAGNOSIS — S0990XA Unspecified injury of head, initial encounter: Secondary | ICD-10-CM | POA: Diagnosis not present

## 2019-11-10 DIAGNOSIS — H532 Diplopia: Secondary | ICD-10-CM | POA: Diagnosis not present

## 2019-11-10 DIAGNOSIS — Z85819 Personal history of malignant neoplasm of unspecified site of lip, oral cavity, and pharynx: Secondary | ICD-10-CM | POA: Diagnosis not present

## 2019-11-10 DIAGNOSIS — Y999 Unspecified external cause status: Secondary | ICD-10-CM | POA: Insufficient documentation

## 2019-11-10 DIAGNOSIS — Y9389 Activity, other specified: Secondary | ICD-10-CM | POA: Insufficient documentation

## 2019-11-10 DIAGNOSIS — R0789 Other chest pain: Secondary | ICD-10-CM | POA: Diagnosis not present

## 2019-11-10 DIAGNOSIS — Y929 Unspecified place or not applicable: Secondary | ICD-10-CM | POA: Insufficient documentation

## 2019-11-10 DIAGNOSIS — E860 Dehydration: Secondary | ICD-10-CM | POA: Insufficient documentation

## 2019-11-10 DIAGNOSIS — R634 Abnormal weight loss: Secondary | ICD-10-CM | POA: Insufficient documentation

## 2019-11-10 DIAGNOSIS — E876 Hypokalemia: Secondary | ICD-10-CM | POA: Insufficient documentation

## 2019-11-10 DIAGNOSIS — R42 Dizziness and giddiness: Secondary | ICD-10-CM | POA: Diagnosis present

## 2019-11-10 HISTORY — DX: Systemic sclerosis, unspecified: M34.9

## 2019-11-10 LAB — CBC WITH DIFFERENTIAL/PLATELET
Abs Immature Granulocytes: 0.01 10*3/uL (ref 0.00–0.07)
Basophils Absolute: 0 10*3/uL (ref 0.0–0.1)
Basophils Relative: 0 %
Eosinophils Absolute: 0 10*3/uL (ref 0.0–0.5)
Eosinophils Relative: 0 %
HCT: 38.8 % (ref 36.0–46.0)
Hemoglobin: 12.8 g/dL (ref 12.0–15.0)
Immature Granulocytes: 0 %
Lymphocytes Relative: 9 %
Lymphs Abs: 0.7 10*3/uL (ref 0.7–4.0)
MCH: 31.8 pg (ref 26.0–34.0)
MCHC: 33 g/dL (ref 30.0–36.0)
MCV: 96.3 fL (ref 80.0–100.0)
Monocytes Absolute: 0.5 10*3/uL (ref 0.1–1.0)
Monocytes Relative: 7 %
Neutro Abs: 6 10*3/uL (ref 1.7–7.7)
Neutrophils Relative %: 84 %
Platelets: 213 10*3/uL (ref 150–400)
RBC: 4.03 MIL/uL (ref 3.87–5.11)
RDW: 14.8 % (ref 11.5–15.5)
WBC: 7.1 10*3/uL (ref 4.0–10.5)
nRBC: 0 % (ref 0.0–0.2)

## 2019-11-10 LAB — COMPREHENSIVE METABOLIC PANEL
ALT: 17 U/L (ref 0–44)
AST: 45 U/L — ABNORMAL HIGH (ref 15–41)
Albumin: 3.6 g/dL (ref 3.5–5.0)
Alkaline Phosphatase: 196 U/L — ABNORMAL HIGH (ref 38–126)
Anion gap: 18 — ABNORMAL HIGH (ref 5–15)
BUN: 11 mg/dL (ref 6–20)
CO2: 24 mmol/L (ref 22–32)
Calcium: 9.3 mg/dL (ref 8.9–10.3)
Chloride: 98 mmol/L (ref 98–111)
Creatinine, Ser: 0.81 mg/dL (ref 0.44–1.00)
GFR calc Af Amer: >60 mL/min (ref >60)
GFR calc non Af Amer: >60 mL/min (ref >60)
Glucose, Bld: 150 mg/dL — ABNORMAL HIGH (ref 70–99)
Potassium: 2.9 mmol/L — ABNORMAL LOW (ref 3.5–5.1)
Sodium: 140 mmol/L (ref 135–145)
Total Bilirubin: 1 mg/dL (ref 0.3–1.2)
Total Protein: 7.2 g/dL (ref 6.5–8.1)

## 2019-11-10 LAB — LIPASE, BLOOD: Lipase: 19 U/L (ref 11–51)

## 2019-11-10 MED ORDER — ONDANSETRON 4 MG PO TBDP: 4.0000 mg | ORAL_TABLET | Freq: Three times a day (TID) | ORAL | 0 refills | Status: DC | PRN

## 2019-11-10 MED ORDER — ONDANSETRON HCL 4 MG/2ML IJ SOLN
4.0000 mg | Freq: Once | INTRAMUSCULAR | Status: AC
  Administered 2019-11-10: 4 mg via INTRAVENOUS
  Filled 2019-11-10: qty 2

## 2019-11-10 MED ORDER — LACTATED RINGERS IV BOLUS
1000.0000 mL | Freq: Once | INTRAVENOUS | Status: AC
  Administered 2019-11-10: 1000 mL via INTRAVENOUS

## 2019-11-10 MED ORDER — SODIUM CHLORIDE 0.9 % IV BOLUS
1000.0000 mL | Freq: Once | INTRAVENOUS | Status: AC
  Administered 2019-11-10: 1000 mL via INTRAVENOUS

## 2019-11-10 MED ORDER — IOHEXOL 300 MG/ML  SOLN
75.0000 mL | Freq: Once | INTRAMUSCULAR | Status: AC | PRN
  Administered 2019-11-10: 75 mL via INTRAVENOUS

## 2019-11-10 MED ORDER — IOHEXOL 9 MG/ML PO SOLN
1000.0000 mL | Freq: Once | ORAL | Status: AC | PRN
Start: 2019-11-10 — End: 2019-11-10
  Administered 2019-11-10: 11:00:00 1000 mL via ORAL

## 2019-11-10 MED ORDER — POTASSIUM CHLORIDE ER 10 MEQ PO TBCR: 10.0000 meq | EXTENDED_RELEASE_TABLET | Freq: Every day | ORAL | 0 refills | Status: AC

## 2019-11-10 MED ORDER — LORAZEPAM 2 MG/ML IJ SOLN
0.5000 mg | Freq: Once | INTRAMUSCULAR | Status: AC
  Administered 2019-11-10: 0.5 mg via INTRAVENOUS
  Filled 2019-11-10: qty 1

## 2019-11-10 MED ORDER — POTASSIUM CHLORIDE 10 MEQ/100ML IV SOLN
10.0000 meq | Freq: Once | INTRAVENOUS | Status: AC
  Administered 2019-11-10: 11:00:00 10 meq via INTRAVENOUS
  Filled 2019-11-10: qty 100

## 2019-11-10 MED ORDER — PROMETHAZINE HCL 25 MG RE SUPP: 25.0000 mg | Freq: Three times a day (TID) | RECTAL | 0 refills | Status: DC | PRN

## 2019-11-10 NOTE — Discharge Instructions (Addendum)
Call Dr. Sabra Heck to discuss your recent ER visit, findings, and discussed possible GI referral  Take the medications as prescribed for nausea.  Try drink at least 6 to 8 glasses of water daily.

## 2019-11-10 NOTE — ED Triage Notes (Signed)
Pt sent over by [facility]. Dr. Sabra Heck reports pt with scleroderma and has had multiple falls lately with some dizziness and he is concerned of a possible head injury. Pt denies pain.

## 2019-11-10 NOTE — ED Notes (Signed)
PT to MRI

## 2019-11-10 NOTE — ED Provider Notes (Signed)
[facility] Emergency Department Provider Note  ____________________________________________   First MD Initiated Contact with Patient 11/10/19 [phone]     (approximate)  I have reviewed the triage vital signs and the nursing notes.   HISTORY  Chief Complaint Dizziness and Nausea    HPI Whitney Mclean is a 54 y.o. female with history of oral cancer, scleroderma, here with nausea, vomiting, falls, and dizziness.  The patient reportedly has had significant clinical decline over the last month.  She has had recurrent falls, has hit her head multiple times, and has had approximately 8 pound weight loss due to persistent nausea and vomiting.  She has no known history of GI involvement of her scleroderma, though this has been a concern per her primary care physician.  She went to her PCP today due to progressive nausea, vomiting, inability to eat or drink, and was subsequent sent here for evaluation.  She does admit that she has fallen due to dizziness with standing and weakness, and has hit her head multiple times.  She is had some moderate dizziness, as well as mild headache.  No neck pain or neck symptoms.  No upper or lower extremity weakness or numbness.  She did fall and hit her chest as well in the past several weeks, with mild chest wall pain.  No cough or sputum production.  No fevers or chills.        Past Medical History:  Diagnosis Date  . Oral cancer ([place])   . Scleroderma ([place])     There are no problems to display for this patient.   Past Surgical History:  Procedure Laterality Date  . ABDOMINAL HYSTERECTOMY      Prior to Admission medications   Medication Sig Start Date End Date Taking? Authorizing Provider  acetaminophen (TYLENOL) 500 MG tablet Take 500 mg by mouth every 6 (six) hours as needed.    [provider]  CALCIUM PO Take 1 tablet by mouth daily.    [provider]  Multiple Vitamins-Minerals (MULTIVITAMIN WOMEN  PO) Take 1 tablet by mouth daily.    [provider]  ondansetron (ZOFRAN ODT) 4 MG disintegrating tablet Take 1 tablet (4 mg total) by mouth every 8 (eight) hours as needed for nausea or vomiting. 11/10/19   Duffy Bruce, MD  ondansetron (ZOFRAN) 4 MG tablet Take 1 tablet (4 mg total) by mouth daily as needed for nausea or vomiting. Patient not taking: Reported on 12/08/2017 07/01/16   Earleen Newport, MD  potassium chloride (KLOR-CON) 10 MEQ tablet Take 1 tablet (10 mEq total) by mouth daily for 7 days. 11/10/19 11/17/19  Duffy Bruce, MD  promethazine (PHENERGAN) 25 MG suppository Place 1 suppository (25 mg total) rectally every 8 (eight) hours as needed for refractory nausea / vomiting. 11/10/19 11/09/20  Duffy Bruce, MD  sulfamethoxazole-trimethoprim (BACTRIM DS) 800-160 MG tablet Take 1 tablet by mouth 2 (two) times daily. 07/01/16   Earleen Newport, MD  varenicline (CHANTIX PAK) 0.5 MG X 11 & 1 MG X 42 tablet Take by mouth 2 (two) times daily. Take one 0.5 mg tablet by mouth once daily for 3 days, then increase to one 0.5 mg tablet twice daily for 4 days, then increase to one 1 mg tablet twice daily.    [provider]    Allergies Oxycodone and Flagyl [metronidazole]  Family History  Problem Relation Age of Onset  . Breast cancer Mother 43  . Breast cancer Maternal Grandmother 40  Social History Social History   Tobacco Use  . Smoking status: Former Research scientist (life sciences)  . Smokeless tobacco: Never Used  Substance Use Topics  . Alcohol use: Yes  . Drug use: Not on file    Review of Systems  Review of Systems  Constitutional: Positive for fatigue. Negative for chills and fever.  HENT: Negative for sore throat.   Respiratory: Negative for shortness of breath.   Cardiovascular: Negative for chest pain.  Gastrointestinal: Positive for nausea and vomiting. Negative for abdominal pain.  Genitourinary: Negative for flank pain.  Musculoskeletal: Positive for  gait problem. Negative for neck pain.  Skin: Negative for rash and wound.  Allergic/Immunologic: Negative for immunocompromised state.  Neurological: Positive for dizziness, weakness and headaches. Negative for numbness.  Hematological: Does not bruise/bleed easily.  All other systems reviewed and are negative.    ____________________________________________  PHYSICAL EXAM:      VITAL SIGNS: ED Triage Vitals  Enc Vitals Group     BP 11/10/19 0921 (!) 131/97     Pulse Rate 11/10/19 0921 (!) 130     Resp 11/10/19 0921 18     Temp 11/10/19 0921 97.8 F (36.6 C)     Temp Source 11/10/19 0921 Oral     SpO2 11/10/19 0921 97 %     Weight 11/10/19 0914 105 lb (47.6 kg)     Height 11/10/19 0914 5\' 4"  (1.626 m)     Head Circumference --      Peak Flow --      Pain Score 11/10/19 0914 0     Pain Loc --      Pain Edu? --      Excl. in [place]? --      Physical Exam Vitals and nursing note reviewed.  Constitutional:      General: She is not in acute distress.    Appearance: She is well-developed.  HENT:     Head: Normocephalic and atraumatic.     Comments: Changes of scleroderma noted    Mouth/Throat:     Mouth: Mucous membranes are dry.  Eyes:     Conjunctiva/sclera: Conjunctivae normal.  Neck:     Comments: No midline TTP. Moderate baseline limited ROM. Cardiovascular:     Rate and Rhythm: Regular rhythm. Tachycardia present.     Heart sounds: Normal heart sounds. No murmur. No friction rub.  Pulmonary:     Effort: Pulmonary effort is normal. No respiratory distress.     Breath sounds: Normal breath sounds. No wheezing or rales.  Abdominal:     General: There is no distension.     Palpations: Abdomen is soft.     Tenderness: There is no abdominal tenderness.     Comments: No focal TTP, no rebound or guarding.  Musculoskeletal:     Cervical back: Neck supple.  Skin:    General: Skin is warm.     Capillary Refill: Capillary refill takes less than 2 seconds.  Neurological:      Mental Status: She is alert and oriented to person, place, and time.     Motor: No abnormal muscle tone.       ____________________________________________   LABS (all labs ordered are listed, but only abnormal results are displayed)  Labs Reviewed  COMPREHENSIVE METABOLIC PANEL - Abnormal; Notable for the following components:      Result Value   Potassium 2.9 (*)    Glucose, Bld 150 (*)    AST 45 (*)    Alkaline Phosphatase 196 (*)  Anion gap 18 (*)    All other components within normal limits  CBC WITH DIFFERENTIAL/PLATELET  LIPASE, BLOOD  URINALYSIS, COMPLETE (UACMP) WITH MICROSCOPIC    ____________________________________________  EKG: NoneNone ________________________________________  RADIOLOGY All imaging, including plain films, CT scans, and ultrasounds, independently reviewed by me, and interpretations confirmed via formal radiology reads.  ED MD interpretation:   CXR: Clear CT Head: NAICA MR Brain: No acute abnormality CT A/P: Negative  Official radiology report(s): CT Head Wo Contrast  Result Date: 11/10/2019 CLINICAL DATA:  Headache, posttraumatic EXAM: CT HEAD WITHOUT CONTRAST TECHNIQUE: Contiguous axial images were obtained from the base of the skull through the vertex without intravenous contrast. COMPARISON:  None. FINDINGS: Brain: No acute intracranial abnormality. Specifically, no hemorrhage, hydrocephalus, mass lesion, acute infarction, or significant intracranial injury. Vascular: No hyperdense vessel or unexpected calcification. Skull: No acute calvarial abnormality. Sinuses/Orbits: Visualized paranasal sinuses and mastoids clear. Orbital soft tissues unremarkable. Other: None IMPRESSION: No acute intracranial abnormality. Electronically Signed   By: Rolm Baptise M.D.   On: 11/10/2019 09:58   MR BRAIN WO CONTRAST  Result Date: 11/10/2019 CLINICAL DATA:  Neuro deficit, acute, stroke suspected. Additional history provided: ? Stroke, history of  oral cancer, scleroderma, multiple falls lately, some dizziness, concern for possible head injury. EXAM: MRI HEAD WITHOUT CONTRAST TECHNIQUE: Multiplanar, multiecho pulse sequences of the brain and surrounding structures were obtained without intravenous contrast. COMPARISON:  Head CT 11/10/2019 FINDINGS: Brain: Mild intermittent motion degradation. There is no evidence of acute infarct. No evidence of intracranial mass. No midline shift or extra-axial fluid collection. No chronic intracranial blood products. No significant white matter disease. Mild generalized parenchymal atrophy. Vascular: Flow voids maintained within the proximal large arterial vessels. Skull and upper cervical spine: No focal marrow lesion. Sinuses/Orbits: Visualized orbits demonstrate no acute abnormality. Minimal scattered paranasal sinus mucosal thickening greatest within anterior left ethmoid and right maxillary sinuses. No significant mastoid effusion IMPRESSION: Mildly motion degraded examination. No evidence of acute intracranial abnormality, including acute infarction. Mild generalized parenchymal atrophy. Electronically Signed   By: Kellie Simmering DO   On: 11/10/2019 12:37   CT ABDOMEN PELVIS W CONTRAST  Result Date: 11/10/2019 CLINICAL DATA:  Nausea, vomiting. Multiple falls EXAM: CT ABDOMEN AND PELVIS WITH CONTRAST TECHNIQUE: Multidetector CT imaging of the abdomen and pelvis was performed using the standard protocol following bolus administration of intravenous contrast. CONTRAST:  17mL OMNIPAQUE IOHEXOL 300 MG/ML  SOLN COMPARISON:  None. FINDINGS: Lower chest: No acute abnormality. Hepatobiliary: No focal liver abnormality is seen. No gallstones, gallbladder wall thickening, or biliary dilatation. Pancreas: Unremarkable. No pancreatic ductal dilatation or surrounding inflammatory changes. Spleen: Normal in size without focal abnormality. Adrenals/Urinary Tract: Adrenal glands are unremarkable. Kidneys are normal, without renal  calculi, focal lesion, or hydronephrosis. Bladder is unremarkable. Stomach/Bowel: Stomach within normal limits. Bowel is well distended with oral contrast. Scattered colonic diverticulosis. No focal bowel wall thickening or inflammatory changes. No dilated loops of bowel. Normal appendix in the right lower quadrant. Vascular/Lymphatic: Scattered atherosclerotic calcifications the abdominal aorta. No aneurysm. No abdominopelvic lymphadenopathy. Reproductive: Status post hysterectomy. No adnexal masses. Other: No abdominal wall hernia or abnormality. No abdominopelvic ascites. Musculoskeletal: No acute or significant osseous findings. IMPRESSION: 1. No acute abdominopelvic findings. 2. Scattered colonic diverticulosis without evidence of acute diverticulitis. Aortic Atherosclerosis (ICD10-I70.0). Electronically Signed   By: Davina Poke D.O.   On: 11/10/2019 14:01   DG Chest Portable 1 View  Result Date: 11/10/2019 CLINICAL DATA:  Pain following fall.  History of  scleroderma EXAM: PORTABLE CHEST 1 VIEW COMPARISON:  July 01, 2016 FINDINGS: Lungs are clear. Heart size and pulmonary vascularity are normal. No adenopathy. There are surgical clips in each supraclavicular region. No pneumothorax. IMPRESSION: Lungs clear. No adenopathy. Heart size normal. Surgical clips in the supraclavicular regions. Electronically Signed   By: Lowella Grip III M.D.   On: 11/10/2019 09:54    ____________________________________________  PROCEDURES   Procedure(s) performed (including Critical Care):  Procedures  ____________________________________________  INITIAL IMPRESSION / MDM / [place] / ED COURSE  As part of my medical decision making, I reviewed the following data within the [place] notes reviewed and incorporated, Old chart reviewed, Notes from prior ED visits, and [place] Controlled Substance Database       *BECCI HRISTOV was evaluated in Emergency  Department on 11/10/2019 for the symptoms described in the history of present illness. She was evaluated in the context of the global COVID-19 pandemic, which necessitated consideration that the patient might be at risk for infection with the SARS-CoV-2 virus that causes COVID-19. Institutional protocols and algorithms that pertain to the evaluation of patients at risk for COVID-19 are in a state of rapid change based on information released by regulatory bodies including the CDC and federal and state organizations. These policies and algorithms were followed during the patient's care in the ED.  Some ED evaluations and interventions may be delayed as a result of limited staffing during the pandemic.*     Medical Decision Making:  54 yo F here with multiple complaints. Sent in from Dr. Ammie Ferrier office.  HA, weakness, diplopia: Differential includes postconcussive syndrome given recent falls, versus CVA, versus cerebellar degeneration related to her autoimmune disease.  The intermittent nature of the symptoms argues against a CVA or structural issue, and CT head is negative.  However, given the diplopia with risk factors for advanced atherosclerosis given her comorbidities, MRI obtained.  MRI negative, no signs of CVA or other acute abnormality. Sx could be post-traumatic concussion. Will have her f/u with PCP, supportive care. She has an upcoming Ophtho appt as well.  Nausea, vomiting, weight loss: Differential includes GI involvement of her scleroderma, viral GI illness, underlying absorptive disease.  Lab work shows hypokalemia and dehydration, which has been replaced.  CT pending.  CT neg. No surgical abnormality. No signs of mass/lesion or adenopathy given her weight loss. She is feeling much better with fluids and is now tolerating PO.   Given reassuring labs, imaging, will d/c with supportive care, antiemetics, GI referral.  ____________________________________________  FINAL CLINICAL  IMPRESSION(S) / ED DIAGNOSES  Final diagnoses:  Dehydration  Hypokalemia  Concussion without loss of consciousness, initial encounter     MEDICATIONS GIVEN DURING THIS VISIT:  Medications  sodium chloride 0.9 % bolus 1,000 mL (0 mLs Intravenous Stopped 11/10/19 1119)  ondansetron (ZOFRAN) injection 4 mg (4 mg Intravenous Given 11/10/19 0956)  lactated ringers bolus 1,000 mL (0 mLs Intravenous Stopped 11/10/19 1510)  potassium chloride 10 mEq in 100 mL IVPB (0 mEq Intravenous Stopped 11/10/19 1345)  iohexol (OMNIPAQUE) 9 MG/ML oral solution 1,000 mL (1,000 mLs Oral Contrast Given 11/10/19 1053)  LORazepam (ATIVAN) injection 0.5 mg (0.5 mg Intravenous Given 11/10/19 1146)  iohexol (OMNIPAQUE) 300 MG/ML solution 75 mL (75 mLs Intravenous Contrast Given 11/10/19 1340)     ED Discharge Orders         Ordered    ondansetron (ZOFRAN ODT) 4 MG disintegrating tablet  Every 8 hours PRN  11/10/19 1448    promethazine (PHENERGAN) 25 MG suppository  Every 8 hours PRN     11/10/19 1448    potassium chloride (KLOR-CON) 10 MEQ tablet  Daily     11/10/19 1448           Note:  This document was prepared using Dragon voice recognition software and may include unintentional dictation errors.   Duffy Bruce, MD 11/10/19 1655

## 2019-11-17 ENCOUNTER — Other Ambulatory Visit: Payer: Self-pay

## 2019-11-17 ENCOUNTER — Ambulatory Visit: Payer: BC Managed Care – PPO | Admitting: Physical Therapy

## 2019-11-17 ENCOUNTER — Encounter: Payer: Self-pay | Admitting: Physical Therapy

## 2019-11-17 DIAGNOSIS — M542 Cervicalgia: Secondary | ICD-10-CM

## 2019-11-17 DIAGNOSIS — M6281 Muscle weakness (generalized): Secondary | ICD-10-CM

## 2019-11-17 DIAGNOSIS — M25611 Stiffness of right shoulder, not elsewhere classified: Secondary | ICD-10-CM

## 2019-11-17 NOTE — Therapy (Addendum)
[facility] [address], [place], [address] Phone: [phone]   Fax:  [phone]  Physical Therapy Treatment/ Progress Note Reporting Period 09/14/20 - 11/17/19  Patient Details  Name: Whitney Mclean MRN: [id number] Date of Birth: December 07, 1965 No data recorded  Encounter Date: 11/17/2019  PT End of Session - 11/17/19 1142    Visit Number  25    Number of Visits  53    Date for PT Re-Evaluation  01/12/20    PT Start Time  1100    PT Stop Time  1145    PT Time Calculation (min)  45 min    Activity Tolerance  Patient tolerated treatment well;No increased pain    Behavior During Therapy  WFL for tasks assessed/performed       Past Medical History:  Diagnosis Date  . Oral cancer ([facility])   . Scleroderma [facility])     Past Surgical History:  Procedure Laterality Date  . ABDOMINAL HYSTERECTOMY      There were no vitals filed for this visit.  Subjective Assessment - 11/17/19 1104    Subjective  Reports she was seen in the ED last week for naseuas, and uncontrollable vomitting. Reports she was diagnosed with concussion and dehydration, and possibility of vertigo. Imaging completed of brain, abdomen, and chest Xray, all negative. Reports no neck or shoulder pain, but that d/t missing PT sessions she has noticed increased fatigue with posture and difficulty holding head up and over head reaching/lifting.    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 Shoulder pain started 3 months ago, no mechanism of injury, was getting better with heat. Reports she had pain for a about a month, and for the past 2 months the shoulder has been stiff. Has associated neck stiffness since her surgery last year . 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 Has  trouble washing her hair, cannot tie her shoes d/t reaching and hand dysfunction, cannot sit up and hold her head up without neck discomfort at her neck, cannot dress herself normally. No pain in shoulder/neck over the past week, describes discomfort/stiffness. 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    Limitations  Sitting;Writing;House hold activities;Lifting    How long can you sit comfortably?  30    How long can you stand comfortably?  unlimited    How long can you walk comfortably?  unlimited    Diagnostic tests  None on shoulder    Patient Stated Goals  Increase shoulder strength and mobility    Pain Onset  More than a month ago       THEREX -AA/ROM scapular protraction/retraction and thoracic spine rotation on NuStep; Level3 84mins, seat7, SPM60s, Arms level 9; cuing for cervical ext to neutral able to hold somewhat - Bilat prone T x10; 1# 2x 10 with cuing for technique initially with good carry over following - Supine flex 2# DB 2x 10 aprrox 60-140d flex with good fatigue noted at end of reps - Standing at door modified bilat W 2x 8 with good carry over following TC  Manual Therapy to reduce pain and tissue tension, improve range of motion, neuromodulation, in order to promote improved ability to complete functional activities. -Passive cervical rotation and lateral bending to  L , and ext off edge of mat table with supportsustained holds(able to get upper cervical spine off mat table) Increased time spent for progression  -MFR to R UT, SCM and cervical flexors                        PT Education - 11/17/19 1107    Education Details  therex form, POC update, HEP update    Person(s) Educated  Patient    Methods  Explanation;Demonstration;Verbal cues    Comprehension  Verbalized understanding;Returned demonstration;Verbal cues required       PT Short Term Goals - 09/15/19 1119      PT SHORT TERM GOAL #1   Title  Pt will be independent  with HEP in order to improve strength and decrease pain in order to improve pain-free function at home and work.    Baseline  08/04/19 Completing HEP regularly withotu question or concern    Time  4    Period  Weeks    Status  Achieved        PT Long Term Goals - 11/17/19 1108      PT LONG TERM GOAL #1   Title  Pt will decrease quick DASH score by at least 8% in order to demonstrate clinically significant reduction in disability.    Baseline  06/11/19 56.8%; 08/04/19 34%    Time  8    Period  Weeks    Status  Achieved      PT LONG TERM GOAL #2   Title  Patient will demonstrate full cervical ROM in order to drive safely    Baseline  ; 11/17/19 R/L rotation 35/25 Sidebending 33/31 ext -6d    Time  8    Period  Weeks    Status  On-going      PT LONG TERM GOAL #3   Title  Patient will demonstrate full R shoulder flex A/PROM in order to complete overhead ADLs    Baseline  11/17/19 AROM flex 120d PROM flex 145; PROM/AROM of ER and IR WNL    Status  Revised      PT LONG TERM GOAL #4   Title  Patient will demonstrate 4+/5 gross periscapular and shoulder strength in order to complete heavy household ADLs and overhead ADLs    Baseline  11/17/19 R/L shoulder flex 3+/4+ abd 4-/5  ER 3+/4+  IR 4/5 T 3+/4+ Y 2+/3+    Time  8    Period  Weeks    Status  New            Plan - 11/17/19 1145    Clinical Impression Statement  PT reassessed patient's goals to date, where she is continuing to make slow progress despite being unable to be consistent over past few weeks d/t medical issues. Patient has improved ER/IR passive and active motion, increasing independence in dressing and bathing, with continued deficit in overhead flexion and gross shoulder/periscapular strength. Goals were revised and PT added strength goals, that patient has been able to work towards with increased motion availible. Patient will continue to benefit from skilled PT to address strength, endurance, and mobility deficits  to return to optimal PLOF, and increase funcitonal independence. PT will continue progression as able.    Personal Factors and Comorbidities  Age;Behavior Pattern;Comorbidity 1;Comorbidity 2;Past/Current Experience;Fitness;Sex;Time since onset of injury/illness/exacerbation    Comorbidities  scleroderma, cancer    Examination-Activity Limitations  Reach Overhead;Self Feeding;Dressing;Sleep;Lift    Examination-Participation Restrictions  Laundry;Cleaning;Community Activity;Driving    Stability/Clinical Decision Making  Evolving/Moderate complexity    Clinical Decision Making  Moderate    Rehab Potential  Good    PT Frequency  2x / week    PT Duration  8 weeks    PT Treatment/Interventions  ADLs/Self Care Home Management;Aquatic Therapy;Moist Heat;Traction;Ultrasound;Cryotherapy;Parrafin;Functional mobility training;Neuromuscular re-education;Taping;Dry needling;Passive range of motion;Joint Manipulations;Spinal Manipulations;Patient/family education;Therapeutic exercise;Manual techniques;Therapeutic activities;Electrical Stimulation;Fluidtherapy;Iontophoresis 4mg /ml Dexamethasone    PT Next Visit Plan  Increase cervical ROM; postural restoration    Consulted and Agree with Plan of Care  Patient       Patient will benefit from skilled therapeutic intervention in order to improve the following deficits and impairments:  Decreased endurance, Decreased mobility, Pain, Postural dysfunction, Impaired UE functional use, Impaired flexibility, Increased fascial restricitons, Decreased strength, Decreased activity tolerance, Decreased range of motion, Decreased scar mobility, Impaired tone, Improper body mechanics, Hypomobility  Visit Diagnosis: Muscle weakness (generalized) - Plan: PT plan of care cert/re-cert  Stiffness of right shoulder, not elsewhere classified - Plan: PT plan of care cert/re-cert  Cervicalgia - Plan: PT plan of care cert/re-cert     Problem List There are no problems to  display for this patient.  Shelton Silvas PT, DPT Shelton Silvas 11/17/2019, 2:58 PM  [facility] [address] [place], [address] Phone: [phone]   Fax:  [phone]  Name: Whitney Mclean MRN: [id number] Date of Birth: 07-24-1966

## 2019-11-24 ENCOUNTER — Ambulatory Visit: Payer: BC Managed Care – PPO | Attending: Internal Medicine | Admitting: Physical Therapy

## 2019-11-24 ENCOUNTER — Other Ambulatory Visit: Payer: Self-pay

## 2019-11-24 ENCOUNTER — Encounter: Payer: Self-pay | Admitting: Physical Therapy

## 2019-11-24 DIAGNOSIS — M542 Cervicalgia: Secondary | ICD-10-CM | POA: Diagnosis present

## 2019-11-24 DIAGNOSIS — M25611 Stiffness of right shoulder, not elsewhere classified: Secondary | ICD-10-CM | POA: Diagnosis present

## 2019-11-24 DIAGNOSIS — M6281 Muscle weakness (generalized): Secondary | ICD-10-CM | POA: Insufficient documentation

## 2019-11-24 NOTE — Therapy (Signed)
[facility] [facility] [address], [place], [address] Phone: [phone]   Fax:  [phone]  Physical Therapy Treatment  Patient Details  Name: Whitney Mclean MRN: [id number] Date of Birth: 04-Jul-1966 No data recorded  Encounter Date: 11/24/2019  PT End of Session - 11/24/19 1312    Visit Number  26    Number of Visits  53    Date for PT Re-Evaluation  01/12/20    PT Start Time  0100    PT Stop Time  0143    PT Time Calculation (min)  43 min    Activity Tolerance  Patient tolerated treatment well;No increased pain    Behavior During Therapy  WFL for tasks assessed/performed       Past Medical History:  Diagnosis Date  . Oral cancer ([place])   . Scleroderma [facility])     Past Surgical History:  Procedure Laterality Date  . ABDOMINAL HYSTERECTOMY      There were no vitals filed for this visit.  Subjective Assessment - 11/24/19 1306    Subjective  Patient reports she saw her RA and oncologist yesterday and they were both impressed with her progress. Reports no pain, compliance with HEP. Is still being seen for medication management, as had no falls, but is continuing have some dizziness off and on.    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 Shoulder pain started 3 months ago, no mechanism of injury, was getting better with heat. Reports she had pain for a about a month, and for the past 2 months the shoulder has been stiff. Has associated neck stiffness since her surgery last year . 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 Has trouble washing her hair, cannot tie her shoes d/t reaching and hand dysfunction, cannot sit up and hold her head up without neck discomfort at her neck, cannot dress herself normally. No pain in shoulder/neck  over the past week, describes discomfort/stiffness. 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    Limitations  Sitting;Writing;House hold activities;Lifting    How long can you sit comfortably?  30    How long can you stand comfortably?  unlimited    How long can you walk comfortably?  unlimited    Diagnostic tests  None on shoulder    Patient Stated Goals  Increase shoulder strength and mobility    Pain Onset  More than a month ago       THEREX -AA/ROM scapular protraction/retraction and thoracic spine rotation on NuStep; Level26mins, seat7, SPM60s, Arms level9;cuing for cervical ext to neutral able to hold somewhat - Lat pull down 15# 2x 10; 20# x10 with min cuing for posture initially with good carry over - Standing row 10# 3x 10with cuing for cervical ext (as much as possible) with good carry over - Bilat prone T x10; 1# x10 with cuing for technique initially with good carry over following - Supine flex 2# DB 3x 10 aprrox 60-140d flex with good fatigue noted at end of reps   Manual Therapy to reduce pain and tissue tension, improve range of motion, neuromodulation, in order to promote improved ability to complete functional activities. -Passive cervical rotation and lateral bending to L , and ext off edge of mat table  with supportsustained holds(able to get upper cervical spine off mat table)Increased time spent for progression -MFR to R UT, SCM and cervical flexors                         PT Education - 11/24/19 1312    Education Details  therex form    Person(s) Educated  Patient    Methods  Explanation;Demonstration;Verbal cues;Tactile cues    Comprehension  Verbalized understanding;Returned demonstration;Verbal cues required;Tactile cues required       PT Short Term Goals - 09/15/19 1119      PT SHORT TERM GOAL #1   Title  Pt will be independent with HEP in order to improve strength and decrease pain in order to improve  pain-free function at home and work.    Baseline  08/04/19 Completing HEP regularly withotu question or concern    Time  4    Period  Weeks    Status  Achieved        PT Long Term Goals - 11/17/19 1108      PT LONG TERM GOAL #1   Title  Pt will decrease quick DASH score by at least 8% in order to demonstrate clinically significant reduction in disability.    Baseline  06/11/19 56.8%; 08/04/19 34%    Time  8    Period  Weeks    Status  Achieved      PT LONG TERM GOAL #2   Title  Patient will demonstrate full cervical ROM in order to drive safely    Baseline  ; 11/17/19 R/L rotation 35/25 Sidebending 33/31 ext -6d    Time  8    Period  Weeks    Status  On-going      PT LONG TERM GOAL #3   Title  Patient will demonstrate full R shoulder flex A/PROM in order to complete overhead ADLs    Baseline  11/17/19 AROM flex 120d PROM flex 145; PROM/AROM of ER and IR WNL    Status  Revised      PT LONG TERM GOAL #4   Title  Patient will demonstrate 4+/5 gross periscapular and shoulder strength in order to complete heavy household ADLs and overhead ADLs    Baseline  11/17/19 R/L shoulder flex 3+/4+ abd 4-/5  ER 3+/4+  IR 4/5 T 3+/4+ Y 2+/3+    Time  8    Period  Weeks    Status  New            Plan - 11/24/19 1324    Clinical Impression Statement  PT continued therex progression for increased mobility and strength with good success. Patient is able to comply with all cuing for proper technique, and is motivated throughout session. PT will continue progresison as able.    Personal Factors and Comorbidities  Age;Behavior Pattern;Comorbidity 1;Comorbidity 2;Past/Current Experience;Fitness;Sex;Time since onset of injury/illness/exacerbation    Comorbidities  scleroderma, cancer    Examination-Activity Limitations  Reach Overhead;Self Feeding;Dressing;Sleep;Lift    Examination-Participation Restrictions  Laundry;Cleaning;Community Activity;Driving    Stability/Clinical Decision Making   Evolving/Moderate complexity    Clinical Decision Making  Moderate    PT Frequency  2x / week    PT Duration  8 weeks    PT Treatment/Interventions  ADLs/Self Care Home Management;Aquatic Therapy;Moist Heat;Traction;Ultrasound;Cryotherapy;Parrafin;Functional mobility training;Neuromuscular re-education;Taping;Dry needling;Passive range of motion;Joint Manipulations;Spinal Manipulations;Patient/family education;Therapeutic exercise;Manual techniques;Therapeutic activities;Electrical Stimulation;Fluidtherapy;Iontophoresis 4mg /ml Dexamethasone    PT Next Visit Plan  Increase cervical ROM; postural restoration  Consulted and Agree with Plan of Care  Patient       Patient will benefit from skilled therapeutic intervention in order to improve the following deficits and impairments:  Decreased endurance, Decreased mobility, Pain, Postural dysfunction, Impaired UE functional use, Impaired flexibility, Increased fascial restricitons, Decreased strength, Decreased activity tolerance, Decreased range of motion, Decreased scar mobility, Impaired tone, Improper body mechanics, Hypomobility  Visit Diagnosis: Muscle weakness (generalized)  Stiffness of right shoulder, not elsewhere classified  Cervicalgia     Problem List There are no problems to display for this patient.  Shelton Silvas PT, DPT Shelton Silvas 11/24/2019, 1:42 PM  [place] [facility] [address] [place], [address] Phone: [phone]   Fax:  [phone]  Name: Whitney Mclean MRN: [id number] Date of Birth: 13-Jul-1966

## 2019-12-01 ENCOUNTER — Other Ambulatory Visit: Payer: Self-pay

## 2019-12-01 ENCOUNTER — Ambulatory Visit: Payer: BC Managed Care – PPO | Admitting: Physical Therapy

## 2019-12-01 ENCOUNTER — Encounter: Payer: Self-pay | Admitting: Physical Therapy

## 2019-12-01 DIAGNOSIS — M25611 Stiffness of right shoulder, not elsewhere classified: Secondary | ICD-10-CM

## 2019-12-01 DIAGNOSIS — M6281 Muscle weakness (generalized): Secondary | ICD-10-CM | POA: Diagnosis not present

## 2019-12-01 DIAGNOSIS — M542 Cervicalgia: Secondary | ICD-10-CM

## 2019-12-01 NOTE — Therapy (Signed)
[place] [facility] [address] [place], [address] Phone: [phone]   Fax:  [phone]  Physical Therapy Treatment  Patient Details  Name: Whitney Mclean MRN: [id number] Date of Birth: 1966-06-28 No data recorded  Encounter Date: 12/01/2019  PT End of Session - 12/01/19 1038    Visit Number  27    Number of Visits  53    Date for PT Re-Evaluation  01/12/20    PT Start Time  1033    PT Stop Time  1113    PT Time Calculation (min)  40 min    Activity Tolerance  Patient tolerated treatment well;No increased pain    Behavior During Therapy  WFL for tasks assessed/performed       Past Medical History:  Diagnosis Date  . Oral cancer ([place])   . Scleroderma [facility])     Past Surgical History:  Procedure Laterality Date  . ABDOMINAL HYSTERECTOMY      There were no vitals filed for this visit.  Subjective Assessment - 12/01/19 1037    Subjective  Patient reports she has been doing better over the past week, and that she has been less dizzy overall. Reports HEP compliance.    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 Shoulder pain started 3 months ago, no mechanism of injury, was getting better with heat. Reports she had pain for a about a month, and for the past 2 months the shoulder has been stiff. Has associated neck stiffness since her surgery last year . 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 Has trouble washing her hair, cannot tie her shoes d/t reaching and hand dysfunction, cannot sit up and hold her head up without neck discomfort at her neck, cannot dress herself normally. No pain in shoulder/neck over the past week, describes discomfort/stiffness. 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    Limitations   Sitting;Writing;House hold activities;Lifting    How long can you sit comfortably?  30    How long can you stand comfortably?  unlimited    How long can you walk comfortably?  unlimited    Diagnostic tests  None on shoulder    Patient Stated Goals  Increase shoulder strength and mobility    Pain Onset  More than a month ago       THEREX -AA/ROM scapular protraction/retraction and thoracic spine rotation on NuStep; Level43mins, seat7, SPM60s, Arms level9;cuing for cervical ext to neutral able to hold somewhat - Lat pull down 20# x10/9/8 with min cuing for posture initially with good carry over - TRX pull up 10/9/8 with cuing for proper technique with good carry over - Reclined bilat shoulder flex (30d incline)1# DB3x 10 aprrox 60-140d flex with good fatigue noted at end of reps (unable with 2#   Manual Therapy to reduce pain and tissue tension, improve range of motion, neuromodulation, in order to promote improved ability to complete functional activities. -Passive cervical rotation and lateral bending to L , and ext off edge of mat table with supportsustained holds(able to get upper cervical spine off mat table)Increased time spent for progression -MFR to R UT, SCMandcervical flexors  PT Education - 12/01/19 1038    Education Details  therex form    Person(s) Educated  Patient    Methods  Explanation;Demonstration;Tactile cues;Verbal cues    Comprehension  Verbalized understanding;Returned demonstration;Verbal cues required       PT Short Term Goals - 09/15/19 1119      PT SHORT TERM GOAL #1   Title  Pt will be independent with HEP in order to improve strength and decrease pain in order to improve pain-free function at home and work.    Baseline  08/04/19 Completing HEP regularly withotu question or concern    Time  4    Period  Weeks    Status  Achieved        PT Long Term Goals - 11/17/19 1108      PT LONG  TERM GOAL #1   Title  Pt will decrease quick DASH score by at least 8% in order to demonstrate clinically significant reduction in disability.    Baseline  06/11/19 56.8%; 08/04/19 34%    Time  8    Period  Weeks    Status  Achieved      PT LONG TERM GOAL #2   Title  Patient will demonstrate full cervical ROM in order to drive safely    Baseline  ; 11/17/19 R/L rotation 35/25 Sidebending 33/31 ext -6d    Time  8    Period  Weeks    Status  On-going      PT LONG TERM GOAL #3   Title  Patient will demonstrate full R shoulder flex A/PROM in order to complete overhead ADLs    Baseline  11/17/19 AROM flex 120d PROM flex 145; PROM/AROM of ER and IR WNL    Status  Revised      PT LONG TERM GOAL #4   Title  Patient will demonstrate 4+/5 gross periscapular and shoulder strength in order to complete heavy household ADLs and overhead ADLs    Baseline  11/17/19 R/L shoulder flex 3+/4+ abd 4-/5  ER 3+/4+  IR 4/5 T 3+/4+ Y 2+/3+    Time  8    Period  Weeks    Status  New            Plan - 12/01/19 1044    Clinical Impression Statement  PT continued therex progression for increased mobility and strenght, with adjunct manual therapy with good success. Patient is motivated throughout session and is able to comply with cuing for technique, with rest breaks needed with progression, but able to complete all planned therex. PT will continue progression as able.    Personal Factors and Comorbidities  Age;Behavior Pattern;Comorbidity 1;Comorbidity 2;Past/Current Experience;Fitness;Sex;Time since onset of injury/illness/exacerbation    Comorbidities  scleroderma, cancer    Examination-Activity Limitations  Reach Overhead;Self Feeding;Dressing;Sleep;Lift    Examination-Participation Restrictions  Laundry;Cleaning;Community Activity;Driving    Stability/Clinical Decision Making  Evolving/Moderate complexity    Clinical Decision Making  Moderate    Rehab Potential  Good    PT Frequency  2x / week    PT  Duration  8 weeks    PT Treatment/Interventions  ADLs/Self Care Home Management;Aquatic Therapy;Moist Heat;Traction;Ultrasound;Cryotherapy;Parrafin;Functional mobility training;Neuromuscular re-education;Taping;Dry needling;Passive range of motion;Joint Manipulations;Spinal Manipulations;Patient/family education;Therapeutic exercise;Manual techniques;Therapeutic activities;Electrical Stimulation;Fluidtherapy;Iontophoresis 4mg /ml Dexamethasone    PT Next Visit Plan  Increase cervical ROM; postural restoration    Consulted and Agree with Plan of Care  Patient       Patient will benefit from skilled therapeutic intervention in order to  improve the following deficits and impairments:  Decreased endurance, Decreased mobility, Pain, Postural dysfunction, Impaired UE functional use, Impaired flexibility, Increased fascial restricitons, Decreased strength, Decreased activity tolerance, Decreased range of motion, Decreased scar mobility, Impaired tone, Improper body mechanics, Hypomobility  Visit Diagnosis: Muscle weakness (generalized)  Stiffness of right shoulder, not elsewhere classified  Cervicalgia     Problem List There are no problems to display for this patient.  Shelton Silvas PT, DPT Shelton Silvas 12/01/2019, 11:07 AM  [place] [address], [place], [address] Phone: [phone]   Fax:  [phone]  Name: MARYLYNN SIMS MRN: [id number] Date of Birth: [birth year]

## 2019-12-08 ENCOUNTER — Ambulatory Visit: Payer: BC Managed Care – PPO | Admitting: Physical Therapy

## 2019-12-15 ENCOUNTER — Other Ambulatory Visit: Payer: Self-pay

## 2019-12-15 ENCOUNTER — Ambulatory Visit: Payer: BC Managed Care – PPO | Admitting: Physical Therapy

## 2019-12-15 ENCOUNTER — Encounter: Payer: Self-pay | Admitting: Physical Therapy

## 2019-12-15 DIAGNOSIS — M542 Cervicalgia: Secondary | ICD-10-CM

## 2019-12-15 DIAGNOSIS — M25611 Stiffness of right shoulder, not elsewhere classified: Secondary | ICD-10-CM

## 2019-12-15 DIAGNOSIS — M6281 Muscle weakness (generalized): Secondary | ICD-10-CM | POA: Diagnosis not present

## 2019-12-15 NOTE — Therapy (Signed)
[facility] [address], [place], [address] Phone: [phone]   Fax:  [phone]  Physical Therapy Treatment  Patient Details  Name: Whitney Mclean MRN: [id number] Date of Birth: 07/04/1966 No data recorded  Encounter Date: 12/15/2019  PT End of Session - 12/15/19 1118    Visit Number  28    Number of Visits  53    Date for PT Re-Evaluation  01/12/20    PT Start Time  1115    PT Stop Time  1155    PT Time Calculation (min)  40 min    Activity Tolerance  Patient tolerated treatment well;No increased pain    Behavior During Therapy  WFL for tasks assessed/performed       Past Medical History:  Diagnosis Date  . Oral cancer ([place])   . Scleroderma [facility])     Past Surgical History:  Procedure Laterality Date  . ABDOMINAL HYSTERECTOMY      There were no vitals filed for this visit.  Subjective Assessment - 12/15/19 1109    Subjective  Patient reports she is continuing to fall in the middle of the night, and her MD has advised her to have a bedside commode. MD believes this is sceleroderma related and she is being followed for this. [facility] two weeks ago and fratured her L wrist.    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 Shoulder pain started 3 months ago, no mechanism of injury, was getting better with heat. Reports she had pain for a about a month, and for the past 2 months the shoulder has been stiff. Has associated neck stiffness since her surgery last year . 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 Has trouble washing her hair, cannot tie her shoes d/t reaching and hand dysfunction, cannot sit up and hold her head up without neck discomfort at her neck, cannot dress herself normally. No pain in shoulder/neck over the  past week, describes discomfort/stiffness. 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    Limitations  Sitting;Writing;House hold activities;Lifting    How long can you sit comfortably?  30    How long can you stand comfortably?  unlimited    How long can you walk comfortably?  unlimited    Diagnostic tests  None on shoulder    Patient Stated Goals  Increase shoulder strength and mobility    Pain Onset  More than a month ago          THEREX -AA/ROM scapular protraction/retraction and thoracic spine rotation on NuStep; Level30mins, seat7, SPM60s, Arms level9;cuing for cervical ext to neutral able to hold somewhat. Decreased resist - Supine overhead flex 1# DB bilat; in recline position 3x 6/10/8 from (70d- 140d) - Prone T 3x 10 with cuing for scapular retraction with good carry over - PRONE short lever Y 2X 10  Manual Therapy to reduce pain and tissue tension, improve range of motion, neuromodulation, in order to promote improved ability to complete functional activities. -Passive cervical rotation and lateral bending to L , and ext off edge of mat table with supportsustained holds(able to get upper cervical spine off mat table)Increased time spent for progression -MFR to R UT, SCMandcervical flexors  PT Education - 12/15/19 1117    Education Details  therex form    Person(s) Educated  Patient    Methods  Explanation;Demonstration;Tactile cues;Verbal cues    Comprehension  Verbalized understanding;Returned demonstration;Verbal cues required;Tactile cues required       PT Short Term Goals - 09/15/19 1119      PT SHORT TERM GOAL #1   Title  Pt will be independent with HEP in order to improve strength and decrease pain in order to improve pain-free function at home and work.    Baseline  08/04/19 Completing HEP regularly withotu question or concern    Time  4    Period  Weeks    Status  Achieved        PT Long  Term Goals - 11/17/19 1108      PT LONG TERM GOAL #1   Title  Pt will decrease quick DASH score by at least 8% in order to demonstrate clinically significant reduction in disability.    Baseline  06/11/19 56.8%; 08/04/19 34%    Time  8    Period  Weeks    Status  Achieved      PT LONG TERM GOAL #2   Title  Patient will demonstrate full cervical ROM in order to drive safely    Baseline  ; 11/17/19 R/L rotation 35/25 Sidebending 33/31 ext -6d    Time  8    Period  Weeks    Status  On-going      PT LONG TERM GOAL #3   Title  Patient will demonstrate full R shoulder flex A/PROM in order to complete overhead ADLs    Baseline  11/17/19 AROM flex 120d PROM flex 145; PROM/AROM of ER and IR WNL    Status  Revised      PT LONG TERM GOAL #4   Title  Patient will demonstrate 4+/5 gross periscapular and shoulder strength in order to complete heavy household ADLs and overhead ADLs    Baseline  11/17/19 R/L shoulder flex 3+/4+ abd 4-/5  ER 3+/4+  IR 4/5 T 3+/4+ Y 2+/3+    Time  8    Period  Weeks    Status  New            Plan - 12/15/19 1157    Clinical Impression Statement  Session modifications needed d/t patient wrist fracture. Patient is able to complete all therex with proper technique following cuing, and is motivated throughout session .Patietn cervical motion and skin tension improves with manual techniques. PT will continue progression as able.    Personal Factors and Comorbidities  Age;Behavior Pattern;Comorbidity 1;Comorbidity 2;Past/Current Experience;Fitness;Sex;Time since onset of injury/illness/exacerbation    Comorbidities  scleroderma, cancer    Examination-Activity Limitations  Reach Overhead;Self Feeding;Dressing;Sleep;Lift    Examination-Participation Restrictions  Laundry;Cleaning;Community Activity;Driving    Stability/Clinical Decision Making  Evolving/Moderate complexity    Clinical Decision Making  Moderate    Rehab Potential  Good    PT Frequency  2x / week    PT  Duration  8 weeks    PT Treatment/Interventions  ADLs/Self Care Home Management;Aquatic Therapy;Moist Heat;Traction;Ultrasound;Cryotherapy;Parrafin;Functional mobility training;Neuromuscular re-education;Taping;Dry needling;Passive range of motion;Joint Manipulations;Spinal Manipulations;Patient/family education;Therapeutic exercise;Manual techniques;Therapeutic activities;Electrical Stimulation;Fluidtherapy;Iontophoresis 4mg /ml Dexamethasone    PT Next Visit Plan  Increase cervical ROM; postural restoration    Consulted and Agree with Plan of Care  Patient       Patient will benefit from skilled therapeutic intervention in order to improve the following deficits and impairments:  Decreased endurance, Decreased mobility, Pain, Postural dysfunction, Impaired UE functional use, Impaired flexibility, Increased fascial restricitons, Decreased strength, Decreased activity tolerance, Decreased range of motion, Decreased scar mobility, Impaired tone, Improper body mechanics, Hypomobility  Visit Diagnosis: Muscle weakness (generalized)  Stiffness of right shoulder, not elsewhere classified  Cervicalgia     Problem List There are no problems to display for this patient.  Shelton Silvas PT, DPT  Shelton Silvas 12/15/2019, 11:58 AM  [facility] [address], [place], [address] Phone: [phone]   Fax:  [phone]  Name: Whitney Mclean MRN: [id number] Date of Birth: [birth year]

## 2019-12-22 ENCOUNTER — Ambulatory Visit: Payer: BC Managed Care – PPO | Admitting: Physical Therapy

## 2019-12-23 ENCOUNTER — Ambulatory Visit: Payer: BC Managed Care – PPO | Admitting: Physical Therapy

## 2019-12-29 ENCOUNTER — Ambulatory Visit: Payer: BC Managed Care – PPO | Admitting: Physical Therapy

## 2020-01-05 ENCOUNTER — Ambulatory Visit: Payer: BC Managed Care – PPO | Admitting: Physical Therapy

## 2020-01-12 ENCOUNTER — Encounter: Payer: Self-pay | Admitting: Physical Therapy

## 2020-01-12 ENCOUNTER — Other Ambulatory Visit: Payer: Self-pay

## 2020-01-12 ENCOUNTER — Ambulatory Visit: Payer: BC Managed Care – PPO | Attending: Internal Medicine | Admitting: Physical Therapy

## 2020-01-12 ENCOUNTER — Encounter: Payer: BC Managed Care – PPO | Admitting: Physical Therapy

## 2020-01-12 DIAGNOSIS — M542 Cervicalgia: Secondary | ICD-10-CM | POA: Diagnosis present

## 2020-01-12 DIAGNOSIS — M25611 Stiffness of right shoulder, not elsewhere classified: Secondary | ICD-10-CM | POA: Diagnosis present

## 2020-01-12 DIAGNOSIS — M79641 Pain in right hand: Secondary | ICD-10-CM | POA: Diagnosis present

## 2020-01-12 DIAGNOSIS — M6281 Muscle weakness (generalized): Secondary | ICD-10-CM | POA: Insufficient documentation

## 2020-01-12 NOTE — Therapy (Signed)
[facility] [address] [place], [address] Phone: [phone]   Fax:  [phone]  Physical Therapy Treatment  Patient Details  Name: Whitney Mclean MRN: [id number] Date of Birth: [birth year] No data recorded  Encounter Date: 01/12/2020  PT End of Session - 01/12/20 1120    Visit Number  29    Number of Visits  53    Date for PT Re-Evaluation  03/31/20    PT Start Time  1115    PT Stop Time  1200    PT Time Calculation (min)  45 min    Activity Tolerance  Patient tolerated treatment well;No increased pain    Behavior During Therapy  WFL for tasks assessed/performed       Past Medical History:  Diagnosis Date  . Oral cancer ([place])   . Scleroderma [facility])     Past Surgical History:  Procedure Laterality Date  . ABDOMINAL HYSTERECTOMY      There were no vitals filed for this visit.  Subjective Assessment - 01/12/20 1121    Subjective  Patient returns to PT following absence with wrist fracture following. Reports she has been trying to work on her mobility but it has been hard without PT.    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 Shoulder pain started 3 months ago, no mechanism of injury, was getting better with heat. Reports she had pain for a about a month, and for the past 2 months the shoulder has been stiff. Has associated neck stiffness since her surgery last year . 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 Has trouble washing her hair, cannot tie her shoes d/t reaching and hand dysfunction, cannot sit up and hold her head up without neck discomfort at her neck, cannot dress herself normally. No pain in shoulder/neck over the past week, describes discomfort/stiffness. Is seeing OT bilat hands d/t scleraderma  stiffness.    Limitations  Sitting;Writing;House hold activities;Lifting    How long can you sit comfortably?  30    How long can you stand comfortably?  unlimited    How long can you walk comfortably?  unlimited    Diagnostic tests  None on shoulder    Patient Stated Goals  Increase shoulder strength and mobility    Pain Onset  More than a month ago      THEREX -AA/ROM scapular protraction/retraction and thoracic spine rotation on NuStep; Level21mins, seat7, SPM60s, Arms level9;cuing for cervical ext to neutral able to hold somewhat - Supine bilat shoulder flex 3x 12 with 1# DB in RUE, LUE BW d/t wrist surgery -  Supine bilat shoulder abd3x 12 with 1# DB in RUE, LUE BW d/t wrist surgery; with min cuing to maintain UE contact with mat table with good carry over   Manual Therapy to reduce pain and tissue tension, improve range of motion, neuromodulation, in order to promote improved ability to complete functional activities. -Passive cervical rotation and lateral bending to L , and ext off edge of mat table with supportsustained holds(able to get upper cervical spine off mat table)Increased time spent for progression -MFR to R UT, SCMandcervical flexors  PT Education - 01/12/20 1127    Education Details  therex form    Person(s) Educated  Patient    Methods  Explanation;Demonstration;Verbal cues    Comprehension  Verbalized understanding;Returned demonstration;Verbal cues required       PT Short Term Goals - 09/15/19 1119      PT SHORT TERM GOAL #1   Title  Pt will be independent with HEP in order to improve strength and decrease pain in order to improve pain-free function at home and work.    Baseline  08/04/19 Completing HEP regularly withotu question or concern    Time  4    Period  Weeks    Status  Achieved        PT Long Term Goals - 11/17/19 1108      PT LONG TERM GOAL #1   Title  Pt will decrease quick  DASH score by at least 8% in order to demonstrate clinically significant reduction in disability.    Baseline  06/11/19 56.8%; 08/04/19 34%    Time  8    Period  Weeks    Status  Achieved      PT LONG TERM GOAL #2   Title  Patient will demonstrate full cervical ROM in order to drive safely    Baseline  ; 11/17/19 R/L rotation 35/25 Sidebending 33/31 ext -6d    Time  8    Period  Weeks    Status  On-going      PT LONG TERM GOAL #3   Title  Patient will demonstrate full R shoulder flex A/PROM in order to complete overhead ADLs    Baseline  11/17/19 AROM flex 120d PROM flex 145; PROM/AROM of ER and IR WNL    Status  Revised      PT LONG TERM GOAL #4   Title  Patient will demonstrate 4+/5 gross periscapular and shoulder strength in order to complete heavy household ADLs and overhead ADLs    Baseline  11/17/19 R/L shoulder flex 3+/4+ abd 4-/5  ER 3+/4+  IR 4/5 T 3+/4+ Y 2+/3+    Time  8    Period  Weeks    Status  New            Plan - 01/12/20 1144    Clinical Impression Statement  PT with increased manual techniques this session d/t increased tension and stiffness from pt PT absence d/t other medical conditions. Increased mobility following manual techniques, allowing for better scapulo-humeral rhythm of therex. PT will continue progression as able.    Personal Factors and Comorbidities  Age;Behavior Pattern;Comorbidity 1;Comorbidity 2;Past/Current Experience;Fitness;Sex;Time since onset of injury/illness/exacerbation    Comorbidities  scleroderma, cancer    Examination-Activity Limitations  Reach Overhead;Self Feeding;Dressing;Sleep;Lift    Examination-Participation Restrictions  Laundry;Cleaning;Community Activity;Driving    Stability/Clinical Decision Making  Evolving/Moderate complexity    Clinical Decision Making  Moderate    Rehab Potential  Good    PT Frequency  2x / week    PT Duration  8 weeks    PT Treatment/Interventions  ADLs/Self Care Home Management;Aquatic  Therapy;Moist Heat;Traction;Ultrasound;Cryotherapy;Parrafin;Functional mobility training;Neuromuscular re-education;Taping;Dry needling;Passive range of motion;Joint Manipulations;Spinal Manipulations;Patient/family education;Therapeutic exercise;Manual techniques;Therapeutic activities;Electrical Stimulation;Fluidtherapy;Iontophoresis 4mg /ml Dexamethasone    PT Next Visit Plan  Increase cervical ROM; postural restoration    Consulted and Agree with Plan of Care  Patient       Patient will benefit from skilled therapeutic intervention in order to improve the following deficits and impairments:  Decreased endurance, Decreased mobility, Pain,  Postural dysfunction, Impaired UE functional use, Impaired flexibility, Increased fascial restricitons, Decreased strength, Decreased activity tolerance, Decreased range of motion, Decreased scar mobility, Impaired tone, Improper body mechanics, Hypomobility  Visit Diagnosis: Muscle weakness (generalized)  Stiffness of right shoulder, not elsewhere classified  Cervicalgia     Problem List There are no problems to display for this patient.  Shelton Silvas PT, DPT Shelton Silvas 01/12/2020, 11:56 AM  [facility] [address] [place], [address] Phone: [phone]   Fax:  [phone]  Name: Whitney Mclean MRN: [id number] Date of Birth: 1966/08/14

## 2020-01-18 ENCOUNTER — Encounter: Payer: BC Managed Care – PPO | Admitting: Physical Therapy

## 2020-01-18 ENCOUNTER — Encounter: Payer: Self-pay | Admitting: Physical Therapy

## 2020-01-18 ENCOUNTER — Other Ambulatory Visit: Payer: Self-pay

## 2020-01-18 ENCOUNTER — Ambulatory Visit: Payer: BC Managed Care – PPO | Admitting: Physical Therapy

## 2020-01-18 DIAGNOSIS — M542 Cervicalgia: Secondary | ICD-10-CM

## 2020-01-18 DIAGNOSIS — M79641 Pain in right hand: Secondary | ICD-10-CM

## 2020-01-18 DIAGNOSIS — M6281 Muscle weakness (generalized): Secondary | ICD-10-CM | POA: Diagnosis not present

## 2020-01-18 DIAGNOSIS — M25611 Stiffness of right shoulder, not elsewhere classified: Secondary | ICD-10-CM

## 2020-01-18 NOTE — Therapy (Signed)
[facility] [address] [place], [address] Phone: [phone]   Fax:  [phone]  Physical Therapy Treatment  Patient Details  Name: Whitney Mclean MRN: [id number] Date of Birth: 1966/01/12 No data recorded  Encounter Date: 01/18/2020  PT End of Session - 01/18/20 1123    Visit Number  30    Number of Visits  69    Date for PT Re-Evaluation  03/31/20    Authorization Type  [employer] jan 2021-jan 2022    Authorization - Visit Number  9    Authorization - Number of Visits  30    PT Start Time  [id number]    PT Stop Time  [id number]    PT Time Calculation (min)  39 min    Activity Tolerance  Patient tolerated treatment well;No increased pain    Behavior During Therapy  WFL for tasks assessed/performed       Past Medical History:  Diagnosis Date  . Oral cancer ([place])   . Scleroderma [facility])     Past Surgical History:  Procedure Laterality Date  . ABDOMINAL HYSTERECTOMY      There were no vitals filed for this visit.  Subjective Assessment - 01/18/20 1120    Subjective  Patient reports no pain today, good compliance with HEP. Patient reports that her abdominal work up came back normal, which she is very happy with.    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 Shoulder pain started 3 months ago, no mechanism of injury, was getting better with heat. Reports she had pain for a about a month, and for the past 2 months the shoulder has been stiff. Has associated neck stiffness since her surgery last year . 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 Has trouble washing her hair, cannot tie her shoes d/t reaching and hand dysfunction, cannot sit up and hold her head up without neck discomfort at her neck, cannot dress herself normally. No  pain in shoulder/neck over the past week, describes discomfort/stiffness. 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    Limitations  Sitting;Writing;House hold activities;Lifting    How long can you sit comfortably?  30    How long can you stand comfortably?  unlimited    How long can you walk comfortably?  unlimited    Diagnostic tests  None on shoulder    Patient Stated Goals  Increase shoulder strength and mobility    Pain Onset  More than a month ago       THEREX -AA/ROM scapular protraction/retraction and thoracic spine rotation on NuStep; Level66mins, seat7, SPM60s, Arms level9;cuing for cervical ext to neutral able to hold somewhat - Supine bilat shoulder flex 3x 12 with 2# DB in RUE, LUE BW d/t wrist surgery -  Supine bilat shoulder abd3x 12 with 2# DB in RUE, LUE BW d/t wrist surgery; with min cuing to maintain UE contact with mat table with good carry over   Manual Therapy to reduce pain and tissue tension, improve range of motion, neuromodulation, in order to promote improved ability to complete functional activities. -Passive cervical rotation and lateral bending to L , and ext off edge of mat table with supportsustained holds(able to get upper cervical spine off mat table)Increased time  spent for progression -MFR to R UT, SCMandcervical flexors                         PT Education - 01/18/20 1122    Education Details  therex form    Person(s) Educated  Patient    Methods  Explanation;Demonstration;Verbal cues    Comprehension  Verbalized understanding;Returned demonstration;Verbal cues required       PT Short Term Goals - 09/15/19 1119      PT SHORT TERM GOAL #1   Title  Pt will be independent with HEP in order to improve strength and decrease pain in order to improve pain-free function at home and work.    Baseline  08/04/19 Completing HEP regularly withotu question or concern    Time  4    Period  Weeks    Status   Achieved        PT Long Term Goals - 11/17/19 1108      PT LONG TERM GOAL #1   Title  Pt will decrease quick DASH score by at least 8% in order to demonstrate clinically significant reduction in disability.    Baseline  06/11/19 56.8%; 08/04/19 34%    Time  8    Period  Weeks    Status  Achieved      PT LONG TERM GOAL #2   Title  Patient will demonstrate full cervical ROM in order to drive safely    Baseline  ; 11/17/19 R/L rotation 35/25 Sidebending 33/31 ext -6d    Time  8    Period  Weeks    Status  On-going      PT LONG TERM GOAL #3   Title  Patient will demonstrate full R shoulder flex A/PROM in order to complete overhead ADLs    Baseline  11/17/19 AROM flex 120d PROM flex 145; PROM/AROM of ER and IR WNL    Status  Revised      PT LONG TERM GOAL #4   Title  Patient will demonstrate 4+/5 gross periscapular and shoulder strength in order to complete heavy household ADLs and overhead ADLs    Baseline  11/17/19 R/L shoulder flex 3+/4+ abd 4-/5  ER 3+/4+  IR 4/5 T 3+/4+ Y 2+/3+    Time  8    Period  Weeks    Status  New            Plan - 01/18/20 1143    Clinical Impression Statement  PT continued manual techniques for increased mobility and decreased tension with success. Patient continues to have no lifting precautions on LUE, which will be re-assessed by MD next session    Personal Factors and Comorbidities  Age;Behavior Pattern;Comorbidity 1;Comorbidity 2;Past/Current Experience;Fitness;Sex;Time since onset of injury/illness/exacerbation    Comorbidities  scleroderma, cancer    Examination-Activity Limitations  Reach Overhead;Self Feeding;Dressing;Sleep;Lift    Examination-Participation Restrictions  Laundry;Cleaning;Community Activity;Driving    Stability/Clinical Decision Making  Evolving/Moderate complexity    Clinical Decision Making  Moderate    Rehab Potential  Good    PT Frequency  2x / week    PT Duration  8 weeks    PT Treatment/Interventions  ADLs/Self  Care Home Management;Aquatic Therapy;Moist Heat;Traction;Ultrasound;Cryotherapy;Parrafin;Functional mobility training;Neuromuscular re-education;Taping;Dry needling;Passive range of motion;Joint Manipulations;Spinal Manipulations;Patient/family education;Therapeutic exercise;Manual techniques;Therapeutic activities;Electrical Stimulation;Fluidtherapy;Iontophoresis 4mg /ml Dexamethasone    PT Next Visit Plan  Increase cervical ROM; postural restoration    Consulted and Agree with Plan of Care  Patient  Patient will benefit from skilled therapeutic intervention in order to improve the following deficits and impairments:  Decreased endurance, Decreased mobility, Pain, Postural dysfunction, Impaired UE functional use, Impaired flexibility, Increased fascial restricitons, Decreased strength, Decreased activity tolerance, Decreased range of motion, Decreased scar mobility, Impaired tone, Improper body mechanics, Hypomobility  Visit Diagnosis: Muscle weakness (generalized)  Stiffness of right shoulder, not elsewhere classified  Cervicalgia  Pain in right hand     Problem List There are no problems to display for this patient.  Shelton Silvas PT, DPT Shelton Silvas 01/18/2020, 12:20 PM  [facility] [address] [place], [address] Phone: [phone]   Fax:  [phone]  Name: Whitney Mclean MRN: [id number] Date of Birth: Apr 25, 1966

## 2020-01-25 ENCOUNTER — Ambulatory Visit: Payer: BC Managed Care – PPO | Attending: Internal Medicine | Admitting: Physical Therapy

## 2020-01-25 ENCOUNTER — Encounter: Payer: Self-pay | Admitting: Physical Therapy

## 2020-01-25 DIAGNOSIS — M25611 Stiffness of right shoulder, not elsewhere classified: Secondary | ICD-10-CM | POA: Insufficient documentation

## 2020-01-25 DIAGNOSIS — M6281 Muscle weakness (generalized): Secondary | ICD-10-CM | POA: Diagnosis not present

## 2020-01-25 DIAGNOSIS — M542 Cervicalgia: Secondary | ICD-10-CM

## 2020-01-25 NOTE — Therapy (Signed)
[facility] [address] [place], [address] Phone: [phone]   Fax:  [phone]  Physical Therapy Treatment  Patient Details  Name: Whitney Mclean MRN: [id number] Date of Birth: [birth year] No data recorded  Encounter Date: 01/25/2020  PT End of Session - 01/25/20 1125    Visit Number  31    Number of Visits  68    Date for PT Re-Evaluation  03/31/20    Authorization Type  [employer] 30appts jan 2021-jan 2022    Authorization - Visit Number  10    Authorization - Number of Visits  30    PT Start Time  [id number]    PT Stop Time  1200    PT Time Calculation (min)  42 min    Activity Tolerance  Patient tolerated treatment well;No increased pain    Behavior During Therapy  WFL for tasks assessed/performed       Past Medical History:  Diagnosis Date  . Oral cancer ([place])   . Scleroderma [facility])     Past Surgical History:  Procedure Laterality Date  . ABDOMINAL HYSTERECTOMY      There were no vitals filed for this visit.  Subjective Assessment - 01/25/20 1123    Subjective  Patient reports no pain today, compliance with HEP, Is seeing orthopedic MD Monday for wrist, continues to have "no lifting/grabbing today".    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 Shoulder pain started 3 months ago, no mechanism of injury, was getting better with heat. Reports she had pain for a about a month, and for the past 2 months the shoulder has been stiff. Has associated neck stiffness since her surgery last year . 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 Has trouble washing her hair, cannot tie her shoes d/t reaching and hand dysfunction, cannot sit up and hold her head up without neck discomfort at her neck, cannot dress herself normally. No pain in  shoulder/neck over the past week, describes discomfort/stiffness. 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    Limitations  Sitting;Writing;House hold activities;Lifting    How long can you sit comfortably?  30    How long can you stand comfortably?  unlimited    How long can you walk comfortably?  unlimited    Diagnostic tests  None on shoulder    Patient Stated Goals  Increase shoulder strength and mobility    Pain Onset  More than a month ago       THEREX -AA/ROM scapular protraction/retraction and thoracic spine rotation on NuStep; Level66mins, seat7, SPM60s, Arms level9;cuing for cervical ext to neutral able to hold somewhat -Supine bilat shoulder flex 3x 12 with 2# DB in RUE, LUE BW d/t wrist surgery - Supine bilat shoulder abd3x 12 with 2# DB in RUE, LUE BW d/t wrist surgery; with min cuing to maintain UE contact with mat table with good carry over   Manual Therapy to reduce pain and tissue tension, improve range of motion, neuromodulation, in order to promote improved ability to complete functional activities. -Passive cervical rotation and lateral bending to L , and ext off edge of mat table with supportsustained holds(able to get upper cervical spine off mat table)Increased time spent for progression -MFR to R  UT, SCMandcervical flexors                        PT Education - 01/25/20 1124    Education Details  therex form    Person(s) Educated  Patient    Methods  Explanation;Demonstration;Verbal cues    Comprehension  Verbalized understanding;Returned demonstration;Verbal cues required       PT Short Term Goals - 09/15/19 1119      PT SHORT TERM GOAL #1   Title  Pt will be independent with HEP in order to improve strength and decrease pain in order to improve pain-free function at home and work.    Baseline  08/04/19 Completing HEP regularly withotu question or concern    Time  4    Period  Weeks    Status  Achieved         PT Long Term Goals - 11/17/19 1108      PT LONG TERM GOAL #1   Title  Pt will decrease quick DASH score by at least 8% in order to demonstrate clinically significant reduction in disability.    Baseline  06/11/19 56.8%; 08/04/19 34%    Time  8    Period  Weeks    Status  Achieved      PT LONG TERM GOAL #2   Title  Patient will demonstrate full cervical ROM in order to drive safely    Baseline  ; 11/17/19 R/L rotation 35/25 Sidebending 33/31 ext -6d    Time  8    Period  Weeks    Status  On-going      PT LONG TERM GOAL #3   Title  Patient will demonstrate full R shoulder flex A/PROM in order to complete overhead ADLs    Baseline  11/17/19 AROM flex 120d PROM flex 145; PROM/AROM of ER and IR WNL    Status  Revised      PT LONG TERM GOAL #4   Title  Patient will demonstrate 4+/5 gross periscapular and shoulder strength in order to complete heavy household ADLs and overhead ADLs    Baseline  11/17/19 R/L shoulder flex 3+/4+ abd 4-/5  ER 3+/4+  IR 4/5 T 3+/4+ Y 2+/3+    Time  8    Period  Weeks    Status  New            Plan - 01/25/20 1149    Clinical Impression Statement  PT continued progression for increased mobility with good success. PT continued progression without LUE gripping and lifting, per L wrist precautions. PT will continue progression as able, adjusting following MD appt 01/31/20 as advisable.    Personal Factors and Comorbidities  Age;Behavior Pattern;Comorbidity 1;Comorbidity 2;Past/Current Experience;Fitness;Sex;Time since onset of injury/illness/exacerbation    Comorbidities  scleroderma, cancer    Examination-Activity Limitations  Reach Overhead;Self Feeding;Dressing;Sleep;Lift    Examination-Participation Restrictions  Laundry;Cleaning;Community Activity;Driving    Stability/Clinical Decision Making  Evolving/Moderate complexity    Clinical Decision Making  Moderate    Rehab Potential  Good    PT Frequency  2x / week    PT Duration  8 weeks    PT  Treatment/Interventions  ADLs/Self Care Home Management;Aquatic Therapy;Moist Heat;Traction;Ultrasound;Cryotherapy;Parrafin;Functional mobility training;Neuromuscular re-education;Taping;Dry needling;Passive range of motion;Joint Manipulations;Spinal Manipulations;Patient/family education;Therapeutic exercise;Manual techniques;Therapeutic activities;Electrical Stimulation;Fluidtherapy;Iontophoresis 4mg /ml Dexamethasone    PT Next Visit Plan  Increase cervical ROM; postural restoration    Consulted and Agree with Plan of Care  Patient  Patient will benefit from skilled therapeutic intervention in order to improve the following deficits and impairments:  Decreased endurance, Decreased mobility, Pain, Postural dysfunction, Impaired UE functional use, Impaired flexibility, Increased fascial restricitons, Decreased strength, Decreased activity tolerance, Decreased range of motion, Decreased scar mobility, Impaired tone, Improper body mechanics, Hypomobility  Visit Diagnosis: Muscle weakness (generalized)  Stiffness of right shoulder, not elsewhere classified  Cervicalgia     Problem List There are no problems to display for this patient.  Shelton Silvas PT, DPT Shelton Silvas 01/25/2020, 11:53 AM  [facility] [address] [place], [address] Phone: [phone]   Fax:  [phone]  Name: Whitney Mclean MRN: [id number] Date of Birth: [birth year]

## 2020-02-01 ENCOUNTER — Other Ambulatory Visit: Payer: Self-pay

## 2020-02-01 ENCOUNTER — Ambulatory Visit: Payer: BC Managed Care – PPO | Admitting: Physical Therapy

## 2020-02-01 ENCOUNTER — Encounter: Payer: Self-pay | Admitting: Physical Therapy

## 2020-02-01 ENCOUNTER — Ambulatory Visit: Payer: BC Managed Care – PPO | Attending: Internal Medicine

## 2020-02-01 DIAGNOSIS — M25611 Stiffness of right shoulder, not elsewhere classified: Secondary | ICD-10-CM

## 2020-02-01 DIAGNOSIS — M6281 Muscle weakness (generalized): Secondary | ICD-10-CM | POA: Diagnosis not present

## 2020-02-01 DIAGNOSIS — M542 Cervicalgia: Secondary | ICD-10-CM

## 2020-02-01 DIAGNOSIS — Z23 Encounter for immunization: Secondary | ICD-10-CM

## 2020-02-01 NOTE — Progress Notes (Signed)
   Covid-19 Vaccination Clinic  Name:  CHARLETHA RENFER    MRN: [id number] DOB: 1965/11/03  02/01/2020  Ms. Offner was observed post Covid-19 immunization for 15 minutes without incident. She was provided with Vaccine Information Sheet and instruction to access the V-Safe system.   Ms. Azbill was instructed to call 911 with any severe reactions post vaccine: Marland Kitchen Difficulty breathing  . Swelling of face and throat  . A fast heartbeat  . A bad rash all over body  . Dizziness and weakness   Immunizations Administered    Name Date Dose VIS Date Route   Pfizer COVID-19 Vaccine 02/01/2020  2:50 PM 0.3 mL 11/17/2018 Intramuscular   Manufacturer: [place]   Lot: [id number]   [place]: [id number]

## 2020-02-01 NOTE — Therapy (Signed)
[facility] [address], [place], [address] Phone: [phone]   Fax:  [phone]  Physical Therapy Treatment  Patient Details  Name: Whitney Mclean MRN: [id number] Date of Birth: Jun 21, 1966 No data recorded  Encounter Date: 02/01/2020  PT End of Session - 02/01/20 1124    Visit Number  32    Number of Visits  67    Date for PT Re-Evaluation  03/31/20    Authorization Type  [employer] 30appts jan 2021-jan 2022    Authorization - Visit Number  11    Authorization - Number of Visits  30    PT Start Time  1110    PT Stop Time  [id number]    PT Time Calculation (min)  39 min    Activity Tolerance  Patient tolerated treatment well;No increased pain    Behavior During Therapy  WFL for tasks assessed/performed       Past Medical History:  Diagnosis Date  . Oral cancer ([place])   . Scleroderma [facility])     Past Surgical History:  Procedure Laterality Date  . ABDOMINAL HYSTERECTOMY      There were no vitals filed for this visit.  Subjective Assessment - 02/01/20 1118    Subjective  Patient reports she has no pain today, and was cleared without precautions for wrist from orthopedist yesterday. Compliance with HEP.    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 Shoulder pain started 3 months ago, no mechanism of injury, was getting better with heat. Reports she had pain for a about a month, and for the past 2 months the shoulder has been stiff. Has associated neck stiffness since her surgery last year . 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 Has trouble washing her hair, cannot tie her shoes d/t reaching and hand dysfunction, cannot sit up and hold her head up without neck discomfort at her neck, cannot dress herself normally. No pain in  shoulder/neck over the past week, describes discomfort/stiffness. 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    Limitations  Sitting;Writing;House hold activities;Lifting    How long can you sit comfortably?  30    How long can you stand comfortably?  unlimited    How long can you walk comfortably?  unlimited    Diagnostic tests  None on shoulder    Patient Stated Goals  Increase shoulder strength and mobility    Pain Onset  More than a month ago              THEREX -AA/ROM scapular protraction/retraction and thoracic spine rotation on NuStep; Level75mins, seat7, SPM60s, Arms level9;cuing for cervical ext to neutral able to hold somewhat - Lat pull down 20# 3 x8 with min cuing for posture initially with good carry over - Seated rows 20# 3x 10 with cuing for posture with decent carry over -Supine bilat shoulder flex 2x 12 with2# DB in RUE, LUE BW d/t wrist surgery - Supine bilat shoulder abd2x 12 with2# DB in RUE, LUE BW d/t wrist surgery; with min cuing to maintain UE contact with mat table with good carry over - Supine scapular punches 2x 10   Manual Therapy to reduce pain and tissue tension, improve range of motion, neuromodulation, in order to  promote improved ability to complete functional activities. -Passive cervical rotation and lateral bending to L , and ext off edge of mat table with supportsustained holds(able to get upper cervical spine off mat table)                           PT Education - 02/01/20 1123    Education Details  therex form/technique    Person(s) Educated  Patient    Methods  Explanation;Demonstration;Tactile cues;Verbal cues    Comprehension  Verbalized understanding;Returned demonstration;Verbal cues required;Tactile cues required       PT Short Term Goals - 09/15/19 1119      PT SHORT TERM GOAL #1   Title  Pt will be independent with HEP in order to improve strength and decrease pain in order to  improve pain-free function at home and work.    Baseline  08/04/19 Completing HEP regularly withotu question or concern    Time  4    Period  Weeks    Status  Achieved        PT Long Term Goals - 11/17/19 1108      PT LONG TERM GOAL #1   Title  Pt will decrease quick DASH score by at least 8% in order to demonstrate clinically significant reduction in disability.    Baseline  06/11/19 56.8%; 08/04/19 34%    Time  8    Period  Weeks    Status  Achieved      PT LONG TERM GOAL #2   Title  Patient will demonstrate full cervical ROM in order to drive safely    Baseline  ; 11/17/19 R/L rotation 35/25 Sidebending 33/31 ext -6d    Time  8    Period  Weeks    Status  On-going      PT LONG TERM GOAL #3   Title  Patient will demonstrate full R shoulder flex A/PROM in order to complete overhead ADLs    Baseline  11/17/19 AROM flex 120d PROM flex 145; PROM/AROM of ER and IR WNL    Status  Revised      PT LONG TERM GOAL #4   Title  Patient will demonstrate 4+/5 gross periscapular and shoulder strength in order to complete heavy household ADLs and overhead ADLs    Baseline  11/17/19 R/L shoulder flex 3+/4+ abd 4-/5  ER 3+/4+  IR 4/5 T 3+/4+ Y 2+/3+    Time  8    Period  Weeks    Status  New            Plan - 02/01/20 1443    Clinical Impression Statement  With patient's lifting precautions lifted, PT was able to progress strengthening therex with good success. Patient is able to comply with all cuing for proper therex technique with good carry over, good motivation, reporting no pain throughout session. PT will continue progression as able.    Personal Factors and Comorbidities  Age;Behavior Pattern;Comorbidity 1;Comorbidity 2;Past/Current Experience;Fitness;Sex;Time since onset of injury/illness/exacerbation    Comorbidities  scleroderma, cancer    Examination-Activity Limitations  Reach Overhead;Self Feeding;Dressing;Sleep;Lift    Examination-Participation Restrictions   Laundry;Cleaning;Community Activity;Driving    Stability/Clinical Decision Making  Evolving/Moderate complexity    Clinical Decision Making  Moderate    Rehab Potential  Good    PT Frequency  2x / week    PT Duration  8 weeks    PT Treatment/Interventions  ADLs/Self Care Home Management;Aquatic Therapy;Moist Heat;Traction;Ultrasound;Cryotherapy;Parrafin;Functional  mobility training;Neuromuscular re-education;Taping;Dry needling;Passive range of motion;Joint Manipulations;Spinal Manipulations;Patient/family education;Therapeutic exercise;Manual techniques;Therapeutic activities;Electrical Stimulation;Fluidtherapy;Iontophoresis 4mg /ml Dexamethasone    PT Next Visit Plan  Increase cervical ROM; postural restoration    Consulted and Agree with Plan of Care  Patient       Patient will benefit from skilled therapeutic intervention in order to improve the following deficits and impairments:  Decreased endurance, Decreased mobility, Pain, Postural dysfunction, Impaired UE functional use, Impaired flexibility, Increased fascial restricitons, Decreased strength, Decreased activity tolerance, Decreased range of motion, Decreased scar mobility, Impaired tone, Improper body mechanics, Hypomobility  Visit Diagnosis: Muscle weakness (generalized)  Stiffness of right shoulder, not elsewhere classified  Cervicalgia     Problem List There are no problems to display for this patient.  Shelton Silvas PT, DPT Shelton Silvas 02/01/2020, 2:47 PM  [facility] [address], [place], [address] Phone: [phone]   Fax:  [phone]  Name: Whitney Mclean MRN: [id number] Date of Birth: [birth year]

## 2020-02-08 ENCOUNTER — Other Ambulatory Visit: Payer: Self-pay

## 2020-02-08 ENCOUNTER — Ambulatory Visit: Payer: BC Managed Care – PPO | Admitting: Physical Therapy

## 2020-02-08 ENCOUNTER — Encounter: Payer: Self-pay | Admitting: Physical Therapy

## 2020-02-08 DIAGNOSIS — M6281 Muscle weakness (generalized): Secondary | ICD-10-CM | POA: Diagnosis not present

## 2020-02-08 DIAGNOSIS — M542 Cervicalgia: Secondary | ICD-10-CM

## 2020-02-08 DIAGNOSIS — M25611 Stiffness of right shoulder, not elsewhere classified: Secondary | ICD-10-CM

## 2020-02-08 NOTE — Therapy (Signed)
[facility] [address] [place], [address] Phone: [phone]   Fax:  [phone]  Physical Therapy Treatment  Patient Details  Name: Whitney Mclean MRN: [id number] Date of Birth: 1966/07/20 No data recorded  Encounter Date: 02/08/2020  PT End of Session - 02/08/20 1118    Visit Number  33    Number of Visits  14    Date for PT Re-Evaluation  03/31/20    Authorization Type  [employer] 30appts jan 2021-jan 2022    Authorization - Visit Number  12    Authorization - Number of Visits  30    PT Start Time  [id number]    PT Stop Time  [id number]    PT Time Calculation (min)  39 min    Activity Tolerance  Patient tolerated treatment well;No increased pain    Behavior During Therapy  WFL for tasks assessed/performed       Past Medical History:  Diagnosis Date  . Oral cancer ([place])   . Scleroderma [facility])     Past Surgical History:  Procedure Laterality Date  . ABDOMINAL HYSTERECTOMY      There were no vitals filed for this visit.  Subjective Assessment - 02/08/20 1116    Subjective  Patient reports she slipped in her bath last night, did not hit her head, reports she is doing well.    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 Shoulder pain started 3 months ago, no mechanism of injury, was getting better with heat. Reports she had pain for a about a month, and for the past 2 months the shoulder has been stiff. Has associated neck stiffness since her surgery last year . 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 Has trouble washing her hair, cannot tie her shoes d/t reaching and hand dysfunction, cannot sit up and hold her head up without neck discomfort at her neck, cannot dress herself normally. No pain in shoulder/neck over the past week,  describes discomfort/stiffness. 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    Limitations  Sitting;Writing;House hold activities;Lifting    How long can you sit comfortably?  30    How long can you stand comfortably?  unlimited    How long can you walk comfortably?  unlimited    Diagnostic tests  None on shoulder    Patient Stated Goals  Increase shoulder strength and mobility    Pain Onset  More than a month ago       THEREX -AA/ROM scapular protraction/retraction and thoracic spine rotation on NuStep; Level11mins, seat7, SPM60s, Arms level9;cuing for cervical ext to neutral able to hold somewhat - Lat pull down 20# 3 x10with min cuing for posture initially and to prevent shoulder hiking with good carry over - Seated rows 20# 3x 10 with cuing for posture with decent carry over - Reclined (approx 45d) bilat shoulder flex 1# DB 3x 10/9/8 - Supine bilat shoulder abd2 x 12 with2# DB; with min cuing to maintain UE contact with mat table with good carry over - Supine scapular punches with 1# DB 2x 10 with heavy cuing for technique with good carry over   Manual Therapy to reduce pain and tissue tension, improve range of motion, neuromodulation, in order to promote improved  ability to complete functional activities. -Passive cervical rotation and lateral bending to L , and ext off edge of mat table with supportsustained holds(able to get upper cervical spine off mat table)                          PT Education - 02/08/20 1118    Education Details  therex form/technique    Person(s) Educated  Patient    Methods  Explanation;Demonstration;Verbal cues    Comprehension  Verbalized understanding;Returned demonstration;Verbal cues required       PT Short Term Goals - 09/15/19 1119      PT SHORT TERM GOAL #1   Title  Pt will be independent with HEP in order to improve strength and decrease pain in order to improve pain-free function at home and  work.    Baseline  08/04/19 Completing HEP regularly withotu question or concern    Time  4    Period  Weeks    Status  Achieved        PT Long Term Goals - 11/17/19 1108      PT LONG TERM GOAL #1   Title  Pt will decrease quick DASH score by at least 8% in order to demonstrate clinically significant reduction in disability.    Baseline  06/11/19 56.8%; 08/04/19 34%    Time  8    Period  Weeks    Status  Achieved      PT LONG TERM GOAL #2   Title  Patient will demonstrate full cervical ROM in order to drive safely    Baseline  ; 11/17/19 R/L rotation 35/25 Sidebending 33/31 ext -6d    Time  8    Period  Weeks    Status  On-going      PT LONG TERM GOAL #3   Title  Patient will demonstrate full R shoulder flex A/PROM in order to complete overhead ADLs    Baseline  11/17/19 AROM flex 120d PROM flex 145; PROM/AROM of ER and IR WNL    Status  Revised      PT LONG TERM GOAL #4   Title  Patient will demonstrate 4+/5 gross periscapular and shoulder strength in order to complete heavy household ADLs and overhead ADLs    Baseline  11/17/19 R/L shoulder flex 3+/4+ abd 4-/5  ER 3+/4+  IR 4/5 T 3+/4+ Y 2+/3+    Time  8    Period  Weeks    Status  New            Plan - 02/08/20 1137    Clinical Impression Statement  PT continued therex progression for increased mobility and strengthening with good success. Paitent is motivated throughout session and able to comply with all cuing for proper technique of therex without increased pain. Patient without increased pain and good tolerance. PT will continue progression as able.    Personal Factors and Comorbidities  Age;Behavior Pattern;Comorbidity 1;Comorbidity 2;Past/Current Experience;Fitness;Sex;Time since onset of injury/illness/exacerbation    Comorbidities  scleroderma, cancer    Examination-Activity Limitations  Reach Overhead;Self Feeding;Dressing;Sleep;Lift    Examination-Participation Restrictions  Laundry;Cleaning;Community  Activity;Driving    Stability/Clinical Decision Making  Evolving/Moderate complexity    Clinical Decision Making  Moderate    Rehab Potential  Good    PT Frequency  2x / week    PT Duration  8 weeks    PT Treatment/Interventions  ADLs/Self Care Home Management;Aquatic Therapy;Moist Heat;Traction;Ultrasound;Cryotherapy;Parrafin;Functional mobility training;Neuromuscular re-education;Taping;Dry needling;Passive range  of motion;Joint Manipulations;Spinal Manipulations;Patient/family education;Therapeutic exercise;Manual techniques;Therapeutic activities;Electrical Stimulation;Fluidtherapy;Iontophoresis 4mg /ml Dexamethasone    PT Next Visit Plan  Increase cervical ROM; postural restoration    Consulted and Agree with Plan of Care  Patient       Patient will benefit from skilled therapeutic intervention in order to improve the following deficits and impairments:  Decreased endurance, Decreased mobility, Pain, Postural dysfunction, Impaired UE functional use, Impaired flexibility, Increased fascial restricitons, Decreased strength, Decreased activity tolerance, Decreased range of motion, Decreased scar mobility, Impaired tone, Improper body mechanics, Hypomobility  Visit Diagnosis: Muscle weakness (generalized)  Stiffness of right shoulder, not elsewhere classified  Cervicalgia     Problem List There are no problems to display for this patient.  Shelton Silvas PT, DPT Shelton Silvas 02/08/2020, 12:59 PM  [facility] [address], [place], [address] Phone: [phone]   Fax:  [phone]  Name: Whitney Mclean MRN: [id number] Date of Birth: 1966-08-07

## 2020-02-15 ENCOUNTER — Other Ambulatory Visit: Payer: Self-pay

## 2020-02-15 ENCOUNTER — Ambulatory Visit: Payer: BC Managed Care – PPO | Admitting: Physical Therapy

## 2020-02-15 DIAGNOSIS — M25611 Stiffness of right shoulder, not elsewhere classified: Secondary | ICD-10-CM

## 2020-02-15 DIAGNOSIS — M542 Cervicalgia: Secondary | ICD-10-CM

## 2020-02-15 DIAGNOSIS — M6281 Muscle weakness (generalized): Secondary | ICD-10-CM

## 2020-02-15 NOTE — Therapy (Signed)
[facility] [address], [place], [address] Phone: [phone]   Fax:  [phone]  Physical Therapy Treatment  Patient Details  Name: Whitney Mclean MRN: [id number] Date of Birth: 1965/11/01 No data recorded  Encounter Date: 02/15/2020  PT End of Session - 02/15/20 1117    Visit Number  34    Number of Visits  70    Date for PT Re-Evaluation  03/31/20    Authorization Type  [employer] 30appts jan 2021-jan 2022    Authorization - Visit Number  13    Authorization - Number of Visits  30    PT Start Time  1115    PT Stop Time  1155    PT Time Calculation (min)  40 min    Activity Tolerance  Patient tolerated treatment well    Behavior During Therapy  [facility] for tasks assessed/performed       Past Medical History:  Diagnosis Date  . Oral cancer ([place])   . Scleroderma [facility])     Past Surgical History:  Procedure Laterality Date  . ABDOMINAL HYSTERECTOMY      There were no vitals filed for this visit.  Subjective Assessment - 02/15/20 1125    Subjective  Patient reports she is a little more fatigued today, but does not have increased pain, which she is happy with. Compliance with HEP.    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 Shoulder pain started 3 months ago, no mechanism of injury, was getting better with heat. Reports she had pain for a about a month, and for the past 2 months the shoulder has been stiff. Has associated neck stiffness since her surgery last year . 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 Has trouble washing her hair, cannot tie her shoes d/t reaching and hand dysfunction, cannot sit up and hold her head up without neck discomfort at her neck, cannot dress herself normally. No pain in shoulder/neck over the  past week, describes discomfort/stiffness. 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    Limitations  Sitting;Writing;House hold activities;Lifting    How long can you sit comfortably?  30    How long can you stand comfortably?  unlimited    How long can you walk comfortably?  unlimited    Diagnostic tests  None on shoulder    Patient Stated Goals  Increase shoulder strength and mobility    Pain Onset  More than a month ago       THEREX -AA/ROM scapular protraction/retraction and thoracic spine rotation on NuStep; Level68mins, seat7, SPM60s, Arms level9;cuing for cervical ext to neutral able to hold somewhat - Lat pull down 20#3x10with min cuing for posture initially and to prevent shoulder hiking with good carry over -Prone T 3x 10 with rolled towel under forehead to promote cervical ext, min cuing initially for scapular retraction with good carry over - Reclined (approx 45d) bilat shoulder flex 1# DB 3x 10 with cuing to return to as close to 0d as possible - in reclined position bilat shoulder abd 1# DB 3x 8; with min cuing to maintain scapular contact to incline   Manual Therapy to reduce pain and tissue tension, improve range of motion, neuromodulation, in order to promote improved ability  to complete functional activities. -Passive cervical rotation and lateral bending to L , and ext off edge of mat table with supportsustained holds(able to get upper cervical spine off mat table)                          PT Education - 02/15/20 1126    Education Details  therex form/technique    Person(s) Educated  Patient    Methods  Explanation;Demonstration;Verbal cues    Comprehension  Verbalized understanding;Returned demonstration;Verbal cues required       PT Short Term Goals - 09/15/19 1119      PT SHORT TERM GOAL #1   Title  Pt will be independent with HEP in order to improve strength and decrease pain in order to improve pain-free  function at home and work.    Baseline  08/04/19 Completing HEP regularly withotu question or concern    Time  4    Period  Weeks    Status  Achieved        PT Long Term Goals - 11/17/19 1108      PT LONG TERM GOAL #1   Title  Pt will decrease quick DASH score by at least 8% in order to demonstrate clinically significant reduction in disability.    Baseline  06/11/19 56.8%; 08/04/19 34%    Time  8    Period  Weeks    Status  Achieved      PT LONG TERM GOAL #2   Title  Patient will demonstrate full cervical ROM in order to drive safely    Baseline  ; 11/17/19 R/L rotation 35/25 Sidebending 33/31 ext -6d    Time  8    Period  Weeks    Status  On-going      PT LONG TERM GOAL #3   Title  Patient will demonstrate full R shoulder flex A/PROM in order to complete overhead ADLs    Baseline  11/17/19 AROM flex 120d PROM flex 145; PROM/AROM of ER and IR WNL    Status  Revised      PT LONG TERM GOAL #4   Title  Patient will demonstrate 4+/5 gross periscapular and shoulder strength in order to complete heavy household ADLs and overhead ADLs    Baseline  11/17/19 R/L shoulder flex 3+/4+ abd 4-/5  ER 3+/4+  IR 4/5 T 3+/4+ Y 2+/3+    Time  8    Period  Weeks    Status  New            Plan - 02/15/20 1133    Clinical Impression Statement  PT continued therex progression for increased mobiliaty and strength with good scucess. Patient tolerates prone therex well this session, which is an improvement, and is continuing in lawn chair progression for overhead mobility. PT complies well with cuing for proper therex technique, with good motivation, no increased pain. PT will continue progression as able.    Personal Factors and Comorbidities  Age;Behavior Pattern;Comorbidity 1;Comorbidity 2;Past/Current Experience;Fitness;Sex;Time since onset of injury/illness/exacerbation    Comorbidities  scleroderma, cancer    Examination-Activity Limitations  Reach Overhead;Self Feeding;Dressing;Sleep;Lift     Examination-Participation Restrictions  Laundry;Cleaning;Community Activity;Driving    Stability/Clinical Decision Making  Evolving/Moderate complexity    Clinical Decision Making  Moderate    Rehab Potential  Good    PT Frequency  2x / week    PT Duration  8 weeks    PT Treatment/Interventions  ADLs/Self Care Home  Management;Aquatic Therapy;Moist Heat;Traction;Ultrasound;Cryotherapy;Parrafin;Functional mobility training;Neuromuscular re-education;Taping;Dry needling;Passive range of motion;Joint Manipulations;Spinal Manipulations;Patient/family education;Therapeutic exercise;Manual techniques;Therapeutic activities;Electrical Stimulation;Fluidtherapy;Iontophoresis 4mg /ml Dexamethasone    PT Next Visit Plan  Increase cervical ROM; postural restoration    Consulted and Agree with Plan of Care  Patient       Patient will benefit from skilled therapeutic intervention in order to improve the following deficits and impairments:  Decreased endurance, Decreased mobility, Pain, Postural dysfunction, Impaired UE functional use, Impaired flexibility, Increased fascial restricitons, Decreased strength, Decreased activity tolerance, Decreased range of motion, Decreased scar mobility, Impaired tone, Improper body mechanics, Hypomobility  Visit Diagnosis: Muscle weakness (generalized)  Stiffness of right shoulder, not elsewhere classified  Cervicalgia     Problem List There are no problems to display for this patient.  Shelton Silvas PT, DPT Shelton Silvas 02/15/2020, 12:02 PM  [facility] [address] [place], [address] Phone: [phone]   Fax:  [phone]  Name: Whitney Mclean MRN: [id number] Date of Birth: 22-Oct-1965

## 2020-02-23 ENCOUNTER — Encounter: Payer: BC Managed Care – PPO | Admitting: Physical Therapy

## 2020-02-28 ENCOUNTER — Ambulatory Visit: Payer: BC Managed Care – PPO | Attending: Internal Medicine | Admitting: Physical Therapy

## 2020-02-28 ENCOUNTER — Other Ambulatory Visit: Payer: Self-pay

## 2020-02-28 ENCOUNTER — Encounter: Payer: Self-pay | Admitting: Physical Therapy

## 2020-02-28 DIAGNOSIS — M79641 Pain in right hand: Secondary | ICD-10-CM | POA: Diagnosis present

## 2020-02-28 DIAGNOSIS — M6281 Muscle weakness (generalized): Secondary | ICD-10-CM | POA: Diagnosis not present

## 2020-02-28 DIAGNOSIS — M25611 Stiffness of right shoulder, not elsewhere classified: Secondary | ICD-10-CM | POA: Insufficient documentation

## 2020-02-28 DIAGNOSIS — M542 Cervicalgia: Secondary | ICD-10-CM | POA: Insufficient documentation

## 2020-02-28 NOTE — Therapy (Signed)
[facility] [address] [place], [address] Phone: [phone]   Fax:  [phone]  Physical Therapy Treatment  Patient Details  Name: Whitney Mclean MRN: [id number] Date of Birth: 08/10/1966 No data recorded  Encounter Date: 02/28/2020  PT End of Session - 02/28/20 0955    Visit Number  35    Number of Visits  59    Date for PT Re-Evaluation  03/31/20    Authorization Type  [employer] 30appts jan 2021-jan 2022    Authorization - Visit Number  67    Authorization - Number of Visits  30    PT Start Time  0950    PT Stop Time  1028    PT Time Calculation (min)  38 min    Activity Tolerance  Patient tolerated treatment well    Behavior During Therapy  [facility] for tasks assessed/performed       Past Medical History:  Diagnosis Date  . Oral cancer ([place])   . Scleroderma [facility])     Past Surgical History:  Procedure Laterality Date  . ABDOMINAL HYSTERECTOMY      There were no vitals filed for this visit.  Subjective Assessment - 02/28/20 0953    Subjective  No falls since seen last, reports she is feeling pretty well today. 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 Shoulder pain started 3 months ago, no mechanism of injury, was getting better with heat. Reports she had pain for a about a month, and for the past 2 months the shoulder has been stiff. Has associated neck stiffness since her surgery last year . 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 Has trouble washing her hair, cannot tie her shoes d/t reaching and hand dysfunction, cannot sit up and hold her head up without neck discomfort at her neck, cannot dress herself normally. No pain in shoulder/neck over the past week, describes discomfort/stiffness. Is  seeing OT bilat hands d/t scleraderma stiffness.    Limitations  Sitting;Writing;House hold activities;Lifting    How long can you sit comfortably?  30    How long can you stand comfortably?  unlimited    How long can you walk comfortably?  unlimited    Diagnostic tests  None on shoulder    Patient Stated Goals  Increase shoulder strength and mobility    Pain Onset  More than a month ago         THEREX -AA/ROM scapular protraction/retraction and thoracic spine rotation on NuStep; Level53mins, seat7, SPM60s, Arms level9;cuing for cervical ext to neutral able to hold somewhat - Lat pull down 20#x10 22# 2x 10with min cuing for posture initiallyand to prevent shoulder hikingwith good carry over -Prone T x10; 1# 2x 10 with rolled towel under forehead to promote cervical ext, min cuing initially for scapular retraction with good carry over  - Reclined (approx 45d) bilat shoulder flex 1# DB 3x 10 with cuing to return to as close to 0d as possible - in reclined position bilat shoulder abd 1# DB 2x 8; with min cuing to maintain scapular contact to incline   Manual Therapy to reduce pain and tissue tension, improve range of motion, neuromodulation, in order to promote improved ability to complete  functional activities. -Passive cervical rotation and lateral bending to L , and ext off edge of mat table with supportsustained holds(able to get upper cervical spine off mat table)                          PT Education - 02/28/20 0954    Education Details  therex form/technique    Person(s) Educated  Patient    Methods  Explanation;Verbal cues;Demonstration    Comprehension  Verbal cues required;Returned demonstration;Verbalized understanding       PT Short Term Goals - 09/15/19 1119      PT SHORT TERM GOAL #1   Title  Pt will be independent with HEP in order to improve strength and decrease pain in order to improve pain-free function at home and work.     Baseline  08/04/19 Completing HEP regularly withotu question or concern    Time  4    Period  Weeks    Status  Achieved        PT Long Term Goals - 11/17/19 1108      PT LONG TERM GOAL #1   Title  Pt will decrease quick DASH score by at least 8% in order to demonstrate clinically significant reduction in disability.    Baseline  06/11/19 56.8%; 08/04/19 34%    Time  8    Period  Weeks    Status  Achieved      PT LONG TERM GOAL #2   Title  Patient will demonstrate full cervical ROM in order to drive safely    Baseline  ; 11/17/19 R/L rotation 35/25 Sidebending 33/31 ext -6d    Time  8    Period  Weeks    Status  On-going      PT LONG TERM GOAL #3   Title  Patient will demonstrate full R shoulder flex A/PROM in order to complete overhead ADLs    Baseline  11/17/19 AROM flex 120d PROM flex 145; PROM/AROM of ER and IR WNL    Status  Revised      PT LONG TERM GOAL #4   Title  Patient will demonstrate 4+/5 gross periscapular and shoulder strength in order to complete heavy household ADLs and overhead ADLs    Baseline  11/17/19 R/L shoulder flex 3+/4+ abd 4-/5  ER 3+/4+  IR 4/5 T 3+/4+ Y 2+/3+    Time  8    Period  Weeks    Status  New            Plan - 02/28/20 1034    Clinical Impression Statement  PT continued therex progression for overhead and scapular mobility with good success. PT continued to utilzie manual therapy with minimal increased skin tension and PROM following. Pt is motivated throughout session and is able to comply with all cuing for proper technique of all therex.  PT will continue progression as able.    Personal Factors and Comorbidities  Age;Behavior Pattern;Comorbidity 1;Comorbidity 2;Past/Current Experience;Fitness;Sex;Time since onset of injury/illness/exacerbation    Comorbidities  scleroderma, cancer    Examination-Activity Limitations  Reach Overhead;Self Feeding;Dressing;Sleep;Lift    Examination-Participation Restrictions   Laundry;Cleaning;Community Activity;Driving    Stability/Clinical Decision Making  Evolving/Moderate complexity    Clinical Decision Making  Moderate    Rehab Potential  Good    PT Frequency  2x / week    PT Duration  8 weeks    PT Treatment/Interventions  ADLs/Self Care Home Management;Aquatic Therapy;Moist Heat;Traction;Ultrasound;Cryotherapy;Parrafin;Functional mobility  training;Neuromuscular re-education;Taping;Dry needling;Passive range of motion;Joint Manipulations;Spinal Manipulations;Patient/family education;Therapeutic exercise;Manual techniques;Therapeutic activities;Electrical Stimulation;Fluidtherapy;Iontophoresis 4mg /ml Dexamethasone    PT Next Visit Plan  Increase cervical ROM; postural restoration    Consulted and Agree with Plan of Care  Patient       Patient will benefit from skilled therapeutic intervention in order to improve the following deficits and impairments:  Decreased endurance, Decreased mobility, Pain, Postural dysfunction, Impaired UE functional use, Impaired flexibility, Increased fascial restricitons, Decreased strength, Decreased activity tolerance, Decreased range of motion, Decreased scar mobility, Impaired tone, Improper body mechanics, Hypomobility  Visit Diagnosis: Muscle weakness (generalized)  Stiffness of right shoulder, not elsewhere classified  Cervicalgia  Pain in right hand     Problem List There are no problems to display for this patient.  Shelton Silvas PT, DPT Shelton Silvas 02/28/2020, 10:37 AM  [facility] [address], [place], [address] Phone: [phone]   Fax:  [phone]  Name: Whitney Mclean MRN: [id number] Date of Birth: [birth year]

## 2020-02-29 ENCOUNTER — Ambulatory Visit: Payer: BC Managed Care – PPO | Attending: Internal Medicine

## 2020-02-29 DIAGNOSIS — Z23 Encounter for immunization: Secondary | ICD-10-CM

## 2020-02-29 NOTE — Progress Notes (Signed)
° °  Covid-19 Vaccination Clinic  Name:  Whitney Mclean    MRN: [id number] DOB: 09-Jan-1966  02/29/2020  [name] was observed post Covid-19 immunization for 15 minutes without incident. She was provided with Vaccine Information Sheet and instruction to access the V-Safe system.   [name] was instructed to call 911 with any severe reactions post vaccine:  Difficulty breathing   Swelling of face and throat   A fast heartbeat   A bad rash all over body   Dizziness and weakness   Immunizations Administered    Name Date Dose VIS Date [place] COVID-19 Vaccine 02/29/2020  9:24 AM 0.3 mL 11/17/2018 Intramuscular   Manufacturer: [employer]   Lot: [id number]   [place]: [address]-2

## 2020-03-17 ENCOUNTER — Ambulatory Visit: Payer: BC Managed Care – PPO | Admitting: Physical Therapy

## 2020-03-21 ENCOUNTER — Ambulatory Visit: Payer: BC Managed Care – PPO | Admitting: Physical Therapy

## 2020-03-21 ENCOUNTER — Encounter: Payer: Self-pay | Admitting: Physical Therapy

## 2020-03-21 ENCOUNTER — Other Ambulatory Visit: Payer: Self-pay

## 2020-03-21 DIAGNOSIS — M542 Cervicalgia: Secondary | ICD-10-CM

## 2020-03-21 DIAGNOSIS — M6281 Muscle weakness (generalized): Secondary | ICD-10-CM

## 2020-03-21 DIAGNOSIS — M25611 Stiffness of right shoulder, not elsewhere classified: Secondary | ICD-10-CM

## 2020-03-21 NOTE — Therapy (Signed)
[facility] [address] [place], [address] Phone: [phone]   Fax:  [phone]  Physical Therapy Treatment  Patient Details  Name: MALAYIAH MCBRAYER MRN: [id number] Date of Birth: [birth year] No data recorded  Encounter Date: 03/21/2020   PT End of Session - 03/21/20 1539    Visit Number 36    Number of Visits 63    Date for PT Re-Evaluation 03/31/20    Authorization - Visit Number 15    Authorization - Number of Visits 30    PT Start Time 0318    PT Stop Time 0400    PT Time Calculation (min) 42 min    Activity Tolerance Patient tolerated treatment well    Behavior During Therapy [facility] for tasks assessed/performed           Past Medical History:  Diagnosis Date  . Oral cancer ([place])   . Scleroderma [facility])     Past Surgical History:  Procedure Laterality Date  . ABDOMINAL HYSTERECTOMY      There were no vitals filed for this visit.   Subjective Assessment - 03/21/20 1521    Subjective Pt reports she tripped on a dog toy over the weekend and feel onto the wood part of her bed and hurt her L rib cage, is not bruised, but does report some pain here. She reports she had a shingles outbreak about 3 weeks ago in the L armpit, has no open sores, but is still struggling with this. Reports some baseline pain today from shingles but no neck pain.    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 Shoulder pain started 3 months ago, no mechanism of injury, was getting better with heat. Reports she had pain for a about a month, and for the past 2 months the shoulder has been stiff. Has associated neck stiffness since her surgery last year . 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 Has trouble washing her hair, cannot tie her shoes d/t  reaching and hand dysfunction, cannot sit up and hold her head up without neck discomfort at her neck, cannot dress herself normally. No pain in shoulder/neck over the past week, describes discomfort/stiffness. 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    Limitations Sitting;Writing;House hold activities;Lifting    How long can you sit comfortably? 30    How long can you stand comfortably? unlimited    How long can you walk comfortably? unlimited    Diagnostic tests None on shoulder    Patient Stated Goals Increase shoulder strength and mobility    Pain Onset More than a month ago             THEREX -AA/ROM scapular protraction/retraction and thoracic spine rotation on NuStep; Level53mins, seat7, SPM60s, Arms level9;cuing for cervical ext to neutral able to hold somewhat - Lat pull down 22# 3x 18with min cuing for posture initiallyand to prevent shoulder hikingwith good carry over -Prone snow angels 3x 8 with cuing for maintained scapular retraction with good carry over - Reclined (approx 45d) bilat shoulder flex 1# DB 3x 8with cuing to return to as close to 0d as possible, and for scapular retraction into incline with good carry over - in reclined positionbilat shoulderabd 1# DB2x 8; with min  cuing to maintainscapular contact to incline   Manual Therapy to reduce pain and tissue tension, improve range of motion, neuromodulation, in order to promote improved ability to complete functional activities. -Passive cervical rotation and lateral bending to L , and ext off edge of mat table with supportsustained holds(able to get upper cervical spine off mat table)                         PT Education - 03/21/20 1530    Education Details therex form/technique    Person(s) Educated Patient    Methods Explanation;Demonstration;Verbal cues;Tactile cues    Comprehension Verbalized understanding;Returned demonstration;Verbal cues  required;Tactile cues required            PT Short Term Goals - 09/15/19 1119      PT SHORT TERM GOAL #1   Title Pt will be independent with HEP in order to improve strength and decrease pain in order to improve pain-free function at home and work.    Baseline 08/04/19 Completing HEP regularly withotu question or concern    Time 4    Period Weeks    Status Achieved             PT Long Term Goals - 11/17/19 1108      PT LONG TERM GOAL #1   Title Pt will decrease quick DASH score by at least 8% in order to demonstrate clinically significant reduction in disability.    Baseline 06/11/19 56.8%; 08/04/19 34%    Time 8    Period Weeks    Status Achieved      PT LONG TERM GOAL #2   Title Patient will demonstrate full cervical ROM in order to drive safely    Baseline ; 11/17/19 R/L rotation 35/25 Sidebending 33/31 ext -6d    Time 8    Period Weeks    Status On-going      PT LONG TERM GOAL #3   Title Patient will demonstrate full R shoulder flex A/PROM in order to complete overhead ADLs    Baseline 11/17/19 AROM flex 120d PROM flex 145; PROM/AROM of ER and IR WNL    Status Revised      PT LONG TERM GOAL #4   Title Patient will demonstrate 4+/5 gross periscapular and shoulder strength in order to complete heavy household ADLs and overhead ADLs    Baseline 11/17/19 R/L shoulder flex 3+/4+ abd 4-/5  ER 3+/4+  IR 4/5 T 3+/4+ Y 2+/3+    Time 8    Period Weeks    Status New                 Plan - 03/21/20 1542    Clinical Impression Statement PT continued therex progression to continue maintenance of motion and strength with good success. Patient is able to comply with all cuing for proper technique of therex and responds well to manual techiques with decreased pain following. PT will continue progression as able.    Personal Factors and Comorbidities Age;Behavior Pattern;Comorbidity 1;Comorbidity 2;Past/Current Experience;Fitness;Sex;Time since onset of  injury/illness/exacerbation    Comorbidities scleroderma, cancer    Examination-Activity Limitations Reach Overhead;Self Feeding;Dressing;Sleep;Lift    Examination-Participation Restrictions Laundry;Cleaning;Community Activity;Driving    Stability/Clinical Decision Making Evolving/Moderate complexity    Clinical Decision Making Moderate    Rehab Potential Good    PT Frequency 2x / week    PT Duration 8 weeks    PT Treatment/Interventions ADLs/Self Care Home Management;Aquatic Therapy;Moist Heat;Traction;Ultrasound;Cryotherapy;Parrafin;Functional mobility training;Neuromuscular  re-education;Taping;Dry needling;Passive range of motion;Joint Manipulations;Spinal Manipulations;Patient/family education;Therapeutic exercise;Manual techniques;Therapeutic activities;Electrical Stimulation;Fluidtherapy;Iontophoresis 4mg /ml Dexamethasone    PT Next Visit Plan Increase cervical ROM; postural restoration    Consulted and Agree with Plan of Care Patient           Patient will benefit from skilled therapeutic intervention in order to improve the following deficits and impairments:  Decreased endurance, Decreased mobility, Pain, Postural dysfunction, Impaired UE functional use, Impaired flexibility, Increased fascial restricitons, Decreased strength, Decreased activity tolerance, Decreased range of motion, Decreased scar mobility, Impaired tone, Improper body mechanics, Hypomobility  Visit Diagnosis: Muscle weakness (generalized)  Stiffness of right shoulder, not elsewhere classified  Cervicalgia     Problem List There are no problems to display for this patient.  Durwin Reges DPT Durwin Reges 03/21/2020, 4:00 PM  [facility] [address], [place], [address] Phone: [phone]   Fax:  [phone]  Name: MILIANA GANGWER MRN: [id number] Date of Birth: 10-22-1965

## 2020-03-29 ENCOUNTER — Ambulatory Visit: Payer: BC Managed Care – PPO | Attending: Internal Medicine | Admitting: Physical Therapy

## 2020-03-29 ENCOUNTER — Other Ambulatory Visit: Payer: Self-pay

## 2020-03-29 ENCOUNTER — Encounter: Payer: Self-pay | Admitting: Physical Therapy

## 2020-03-29 DIAGNOSIS — M6281 Muscle weakness (generalized): Secondary | ICD-10-CM | POA: Diagnosis present

## 2020-03-29 DIAGNOSIS — M542 Cervicalgia: Secondary | ICD-10-CM | POA: Insufficient documentation

## 2020-03-29 DIAGNOSIS — M25611 Stiffness of right shoulder, not elsewhere classified: Secondary | ICD-10-CM | POA: Diagnosis present

## 2020-03-29 NOTE — Therapy (Signed)
[facility] [address], [place], [address] Phone: [phone]   Fax:  [phone]  Physical Therapy Treatment  Patient Details  Name: Whitney Mclean MRN: [id number] Date of Birth: 04-01-1966 No data recorded  Encounter Date: 03/29/2020   PT End of Session - 03/29/20 1040    Visit Number 37    Number of Visits 72    Date for PT Re-Evaluation 03/31/20    Authorization Type [employer] 30appts jan 2021-jan 2022    Authorization - Visit Number 21    Authorization - Number of Visits 30    PT Start Time 1030    PT Stop Time 1110    PT Time Calculation (min) 40 min    Activity Tolerance Patient tolerated treatment well    Behavior During Therapy [facility] for tasks assessed/performed           Past Medical History:  Diagnosis Date  . Oral cancer ([place])   . Scleroderma [facility])     Past Surgical History:  Procedure Laterality Date  . ABDOMINAL HYSTERECTOMY      There were no vitals filed for this visit.   Subjective Assessment - 03/29/20 1034    Subjective Reports her shingles feels much better following predisone taper. She reports no pain today and good compliance with HEP.    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 Shoulder pain started 3 months ago, no mechanism of injury, was getting better with heat. Reports she had pain for a about a month, and for the past 2 months the shoulder has been stiff. Has associated neck stiffness since her surgery last year . 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 Has trouble washing her hair, cannot tie her shoes d/t reaching and hand dysfunction, cannot sit up and hold her head up without neck discomfort at her neck, cannot dress herself normally. No pain in shoulder/neck over the past week,  describes discomfort/stiffness. 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    Limitations Sitting;Writing;House hold activities;Lifting    How long can you sit comfortably? 30    How long can you stand comfortably? unlimited    How long can you walk comfortably? unlimited    Diagnostic tests None on shoulder    Patient Stated Goals Increase shoulder strength and mobility    Pain Onset More than a month ago           THEREX -AA/ROM scapular protraction/retraction and thoracic spine rotation on NuStep; Level70mins, seat7, SPM60s, Arms level9;cuing for cervical ext to neutral able to hold somewhat - Lat pull down 23# 3x 8with min cuing for cervical neutral with good carry over -Prone snow angels 3x 10 with cuing for maintained scapular retraction with good carry over - Reclined (approx 45d) bilat shoulder flex 1# DB 3x 8with cuing to return to as close to 0d as possible, and for scapular retraction into incline with good carry over - in reclined positionRTB pull aparts 3x 8/7/6 with cuing for scapular retraction, and eccentric control with good carry over   Manual Therapy to reduce pain and tissue tension, improve range of motion, neuromodulation, in order to promote improved ability to complete functional activities. -Passive cervical rotation and lateral bending to L , and ext off  edge of mat table with supportsustained holds(able to get upper cervical spine off mat table)                          PT Education - 03/29/20 1037    Education Details therex form/technique    Person(s) Educated Patient    Methods Explanation;Demonstration;Verbal cues    Comprehension Verbalized understanding;Returned demonstration;Verbal cues required            PT Short Term Goals - 09/15/19 1119      PT SHORT TERM GOAL #1   Title Pt will be independent with HEP in order to improve strength and decrease pain in order to improve pain-free function at  home and work.    Baseline 08/04/19 Completing HEP regularly withotu question or concern    Time 4    Period Weeks    Status Achieved             PT Long Term Goals - 11/17/19 1108      PT LONG TERM GOAL #1   Title Pt will decrease quick DASH score by at least 8% in order to demonstrate clinically significant reduction in disability.    Baseline 06/11/19 56.8%; 08/04/19 34%    Time 8    Period Weeks    Status Achieved      PT LONG TERM GOAL #2   Title Patient will demonstrate full cervical ROM in order to drive safely    Baseline ; 11/17/19 R/L rotation 35/25 Sidebending 33/31 ext -6d    Time 8    Period Weeks    Status On-going      PT LONG TERM GOAL #3   Title Patient will demonstrate full R shoulder flex A/PROM in order to complete overhead ADLs    Baseline 11/17/19 AROM flex 120d PROM flex 145; PROM/AROM of ER and IR WNL    Status Revised      PT LONG TERM GOAL #4   Title Patient will demonstrate 4+/5 gross periscapular and shoulder strength in order to complete heavy household ADLs and overhead ADLs    Baseline 11/17/19 R/L shoulder flex 3+/4+ abd 4-/5  ER 3+/4+  IR 4/5 T 3+/4+ Y 2+/3+    Time 8    Period Weeks    Status New                 Plan - 03/29/20 1051    Clinical Impression Statement PT continued therex progression to continue to increase motion and strength with good success. Patient continues to respond well to manual techniques with increased soft tissue mobility following. PT will continue progression as able.    Personal Factors and Comorbidities Age;Behavior Pattern;Comorbidity 1;Comorbidity 2;Past/Current Experience;Fitness;Sex;Time since onset of injury/illness/exacerbation    Comorbidities scleroderma, cancer    Examination-Activity Limitations Reach Overhead;Self Feeding;Dressing;Sleep;Lift    Examination-Participation Restrictions Laundry;Cleaning;Community Activity;Driving    Stability/Clinical Decision Making Evolving/Moderate complexity     Clinical Decision Making Moderate    Rehab Potential Good    PT Frequency 2x / week    PT Duration 8 weeks    PT Treatment/Interventions ADLs/Self Care Home Management;Aquatic Therapy;Moist Heat;Traction;Ultrasound;Cryotherapy;Parrafin;Functional mobility training;Neuromuscular re-education;Taping;Dry needling;Passive range of motion;Joint Manipulations;Spinal Manipulations;Patient/family education;Therapeutic exercise;Manual techniques;Therapeutic activities;Electrical Stimulation;Fluidtherapy;Iontophoresis 4mg /ml Dexamethasone    PT Next Visit Plan Increase cervical ROM; postural restoration    Consulted and Agree with Plan of Care Patient           Patient will benefit from skilled therapeutic intervention  in order to improve the following deficits and impairments:  Decreased endurance, Decreased mobility, Pain, Postural dysfunction, Impaired UE functional use, Impaired flexibility, Increased fascial restricitons, Decreased strength, Decreased activity tolerance, Decreased range of motion, Decreased scar mobility, Impaired tone, Improper body mechanics, Hypomobility  Visit Diagnosis: Muscle weakness (generalized)  Stiffness of right shoulder, not elsewhere classified  Cervicalgia     Problem List There are no problems to display for this patient.  Durwin Reges DPT Durwin Reges 03/29/2020, 10:59 AM  [facility] [address], [place], [address] Phone: [phone]   Fax:  [phone]  Name: Whitney Mclean MRN: [id number] Date of Birth: [birth year]

## 2020-04-12 ENCOUNTER — Ambulatory Visit: Payer: BC Managed Care – PPO | Admitting: Physical Therapy

## 2020-04-12 ENCOUNTER — Other Ambulatory Visit: Payer: Self-pay

## 2020-04-12 ENCOUNTER — Encounter: Payer: Self-pay | Admitting: Physical Therapy

## 2020-04-12 DIAGNOSIS — M6281 Muscle weakness (generalized): Secondary | ICD-10-CM

## 2020-04-12 DIAGNOSIS — M542 Cervicalgia: Secondary | ICD-10-CM

## 2020-04-12 DIAGNOSIS — M25611 Stiffness of right shoulder, not elsewhere classified: Secondary | ICD-10-CM

## 2020-04-12 NOTE — Therapy (Signed)
[facility] [address] [place], [address] Phone: [phone]   Fax:  [phone]  Physical Therapy Treatment  Patient Details  Name: Whitney Mclean MRN: [id number] Date of Birth: 13-Jul-1966 No data recorded  Encounter Date: 04/12/2020   PT End of Session - 04/12/20 1334    Visit Number 38    Number of Visits 49    Date for PT Re-Evaluation 03/31/20    Authorization Type [employer] 30appts jan 2021-jan 2022    Authorization - Visit Number 77    Authorization - Number of Visits 30    PT Start Time 1115    PT Stop Time 1154    PT Time Calculation (min) 39 min    Activity Tolerance Patient tolerated treatment well    Behavior During Therapy [facility] for tasks assessed/performed           Past Medical History:  Diagnosis Date  . Oral cancer ([place])   . Scleroderma [facility])     Past Surgical History:  Procedure Laterality Date  . ABDOMINAL HYSTERECTOMY      There were no vitals filed for this visit.   Subjective Assessment - 04/12/20 1124    Subjective Reports she is feeling good overall. Compliance with HEP, no questions or concerns.    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 Shoulder pain started 3 months ago, no mechanism of injury, was getting better with heat. Reports she had pain for a about a month, and for the past 2 months the shoulder has been stiff. Has associated neck stiffness since her surgery last year . 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 Has trouble washing her hair, cannot tie her shoes d/t reaching and hand dysfunction, cannot sit up and hold her head up without neck discomfort at her neck, cannot dress herself normally. No pain in shoulder/neck over the past week, describes discomfort/stiffness. Is seeing OT  bilat hands d/t scleraderma stiffness.    Limitations Sitting;Writing;House hold activities;Lifting    How long can you sit comfortably? 30    How long can you stand comfortably? unlimited    How long can you walk comfortably? unlimited    Diagnostic tests None on shoulder    Patient Stated Goals Increase shoulder strength and mobility    Pain Onset More than a month ago           THEREX -AA/ROM scapular protraction/retraction and thoracic spine rotation on NuStep; Level43mins, seat7, SPM60s, Arms level9;cuing for cervical ext to neutral able to hold somewhat - Lat pull down 25#2x 5; 23# x8 with min cuing for cervical neutral with good carry over - Reclined (approx 45d) bilat shoulder flex 2# DB 3x6with cuing to return to as close to 0d as possible, and for scapular retraction into incline with good carry over - in reclined positionRTB overhead scaption 3x8 with min cuing to maintain horizontal abd with good carry over Prone snow angels x10; 1# x6 with difficulty maintaining scapular retraction in Y position, good carry over following cuing   Manual Therapy to reduce pain and tissue tension, improve range of motion, neuromodulation, in order to promote improved ability to complete functional activities. -Passive cervical rotation and lateral bending to L , and ext off edge of mat  table with supportsustained holds(able to get upper cervical spine off mat table)                             PT Education - 04/12/20 1338    Education Details therex form/technique    Person(s) Educated Patient    Methods Explanation;Demonstration;Verbal cues    Comprehension Verbal cues required;Returned demonstration;Verbalized understanding            PT Short Term Goals - 09/15/19 1119      PT SHORT TERM GOAL #1   Title Pt will be independent with HEP in order to improve strength and decrease pain in order to improve pain-free function at home and work.     Baseline 08/04/19 Completing HEP regularly withotu question or concern    Time 4    Period Weeks    Status Achieved             PT Long Term Goals - 11/17/19 1108      PT LONG TERM GOAL #1   Title Pt will decrease quick DASH score by at least 8% in order to demonstrate clinically significant reduction in disability.    Baseline 06/11/19 56.8%; 08/04/19 34%    Time 8    Period Weeks    Status Achieved      PT LONG TERM GOAL #2   Title Patient will demonstrate full cervical ROM in order to drive safely    Baseline ; 11/17/19 R/L rotation 35/25 Sidebending 33/31 ext -6d    Time 8    Period Weeks    Status On-going      PT LONG TERM GOAL #3   Title Patient will demonstrate full R shoulder flex A/PROM in order to complete overhead ADLs    Baseline 11/17/19 AROM flex 120d PROM flex 145; PROM/AROM of ER and IR WNL    Status Revised      PT LONG TERM GOAL #4   Title Patient will demonstrate 4+/5 gross periscapular and shoulder strength in order to complete heavy household ADLs and overhead ADLs    Baseline 11/17/19 R/L shoulder flex 3+/4+ abd 4-/5  ER 3+/4+  IR 4/5 T 3+/4+ Y 2+/3+    Time 8    Period Weeks    Status New                 Plan - 04/12/20 1334    Clinical Impression Statement PT continued therex progression for postural control and maintaining neutral cervical spine range with success. Pt complies well with cuing for proper technique and muscle activation and is motivated throughout session. PT continued to utilize manual techniques to maintain availible motion and skin mobility with success. PT will continue progression as able.    Personal Factors and Comorbidities Age;Behavior Pattern;Comorbidity 1;Comorbidity 2;Past/Current Experience;Fitness;Sex;Time since onset of injury/illness/exacerbation    Comorbidities scleroderma, cancer    Examination-Activity Limitations Reach Overhead;Self Feeding;Dressing;Sleep;Lift    Examination-Participation Restrictions  Laundry;Cleaning;Community Activity;Driving    Stability/Clinical Decision Making Evolving/Moderate complexity    Clinical Decision Making Moderate    Rehab Potential Good    PT Frequency 2x / week    PT Duration 8 weeks    PT Treatment/Interventions ADLs/Self Care Home Management;Aquatic Therapy;Moist Heat;Traction;Ultrasound;Cryotherapy;Parrafin;Functional mobility training;Neuromuscular re-education;Taping;Dry needling;Passive range of motion;Joint Manipulations;Spinal Manipulations;Patient/family education;Therapeutic exercise;Manual techniques;Therapeutic activities;Electrical Stimulation;Fluidtherapy;Iontophoresis 4mg /ml Dexamethasone    PT Next Visit Plan Increase cervical ROM; postural restoration    Consulted and Agree with Plan of Care  Patient           Patient will benefit from skilled therapeutic intervention in order to improve the following deficits and impairments:  Decreased endurance, Decreased mobility, Pain, Postural dysfunction, Impaired UE functional use, Impaired flexibility, Increased fascial restricitons, Decreased strength, Decreased activity tolerance, Decreased range of motion, Decreased scar mobility, Impaired tone, Improper body mechanics, Hypomobility  Visit Diagnosis: Muscle weakness (generalized)  Stiffness of right shoulder, not elsewhere classified  Cervicalgia     Problem List There are no problems to display for this patient.  Durwin Reges DPT Durwin Reges 04/12/2020, 1:44 PM  [facility] [address], [place], [address] Phone: [phone]   Fax:  [phone]  Name: Whitney Mclean MRN: [id number] Date of Birth: 1966/07/06

## 2020-04-26 ENCOUNTER — Ambulatory Visit: Payer: BC Managed Care – PPO | Attending: Internal Medicine | Admitting: Physical Therapy

## 2020-04-26 ENCOUNTER — Other Ambulatory Visit: Payer: Self-pay

## 2020-04-26 ENCOUNTER — Encounter: Payer: Self-pay | Admitting: Physical Therapy

## 2020-04-26 DIAGNOSIS — M25611 Stiffness of right shoulder, not elsewhere classified: Secondary | ICD-10-CM | POA: Diagnosis present

## 2020-04-26 DIAGNOSIS — M6281 Muscle weakness (generalized): Secondary | ICD-10-CM | POA: Insufficient documentation

## 2020-04-26 DIAGNOSIS — M542 Cervicalgia: Secondary | ICD-10-CM | POA: Insufficient documentation

## 2020-04-26 NOTE — Therapy (Signed)
[facility] [address] [place], [address] Phone: [phone]   Fax:  [phone]  Physical Therapy Treatment  Patient Details  Name: Whitney Mclean MRN: [id number] Date of Birth: May 16, 1966 No data recorded  Encounter Date: 04/26/2020   PT End of Session - 04/26/20 1128    Date for PT Re-Evaluation 06/22/20    Authorization - Visit Number 28    Authorization - Number of Visits 30    PT Start Time 8416    PT Stop Time 1155    PT Time Calculation (min) 38 min    Activity Tolerance Patient tolerated treatment well    Behavior During Therapy [facility] for tasks assessed/performed           Past Medical History:  Diagnosis Date   Oral cancer ([place])    Scleroderma ([place])     Past Surgical History:  Procedure Laterality Date   ABDOMINAL HYSTERECTOMY      There were no vitals filed for this visit.   Subjective Assessment - 04/26/20 1123    Subjective Reports she is still struggling with shingles, but is trying to continue moving as much as possible and is diligent with HEP.    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 Shoulder pain started 3 months ago, no mechanism of injury, was getting better with heat. Reports she had pain for a about a month, and for the past 2 months the shoulder has been stiff. Has associated neck stiffness since her surgery last year . 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 Has trouble washing her hair, cannot tie her shoes d/t reaching and hand dysfunction, cannot sit up and hold her head up without neck discomfort at her neck, cannot dress herself normally. No pain in shoulder/neck over the past week, describes discomfort/stiffness. 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    Limitations  Sitting;Writing;House hold activities;Lifting    How long can you sit comfortably? 30    How long can you stand comfortably? unlimited    How long can you walk comfortably? unlimited    Diagnostic tests None on shoulder    Patient Stated Goals Increase shoulder strength and mobility    Pain Onset More than a month ago           THEREX -AA/ROM scapular protraction/retraction and thoracic spine rotation on NuStep; Level32.77mins L4 2.25mins, seat7, SPM60s, Arms level9;cuing for cervical ext to neutral able to hold somewhat - Standing Y on wall 3x 10 with cuing to prevent trunk ext compensation with decent carry over - Lat pull down 20# x10; 23# 2x 6; unable to complete with  - OMEGA incline press 10# 3x 10 with min cuing for posture with good carry over - Reclined (approx 45d) bilat shoulder flex 2# DB 3x10/9/8with cuing to return to as close to 0d as possible, and for scapular retraction into incline with good carry over   Manual Therapy to reduce pain and tissue tension, improve range of motion, neuromodulation, in order to promote improved ability to complete functional activities. -Passive cervical rotation and lateral bending to L , and ext off edge of mat table with supportsustained holds(able to get upper cervical spine off mat table)  PT Education - 04/26/20 1125    Education Details therex form/technique    Person(s) Educated Patient    Methods Explanation;Demonstration;Verbal cues    Comprehension Verbalized understanding;Returned demonstration;Verbal cues required            PT Short Term Goals - 09/15/19 1119      PT SHORT TERM GOAL #1   Title Pt will be independent with HEP in order to improve strength and decrease pain in order to improve pain-free function at home and work.    Baseline 08/04/19 Completing HEP regularly withotu question or concern    Time 4    Period Weeks    Status Achieved              PT Long Term Goals - 11/17/19 1108      PT LONG TERM GOAL #1   Title Pt will decrease quick DASH score by at least 8% in order to demonstrate clinically significant reduction in disability.    Baseline 06/11/19 56.8%; 08/04/19 34%    Time 8    Period Weeks    Status Achieved      PT LONG TERM GOAL #2   Title Patient will demonstrate full cervical ROM in order to drive safely    Baseline ; 11/17/19 R/L rotation 35/25 Sidebending 33/31 ext -6d    Time 8    Period Weeks    Status On-going      PT LONG TERM GOAL #3   Title Patient will demonstrate full R shoulder flex A/PROM in order to complete overhead ADLs    Baseline 11/17/19 AROM flex 120d PROM flex 145; PROM/AROM of ER and IR WNL    Status Revised      PT LONG TERM GOAL #4   Title Patient will demonstrate 4+/5 gross periscapular and shoulder strength in order to complete heavy household ADLs and overhead ADLs    Baseline 11/17/19 R/L shoulder flex 3+/4+ abd 4-/5  ER 3+/4+  IR 4/5 T 3+/4+ Y 2+/3+    Time 8    Period Weeks    Status New                 Plan - 04/26/20 1138    Clinical Impression Statement PT continued therex progression for overhead strength and neutral posture with success. Pt continues to comply well with cuing for proper technique and increases cervical ROM with maual techniques. PT will continue progression as able.    Personal Factors and Comorbidities Age;Behavior Pattern;Comorbidity 1;Comorbidity 2;Past/Current Experience;Fitness;Sex;Time since onset of injury/illness/exacerbation    Comorbidities scleroderma, cancer    Examination-Activity Limitations Reach Overhead;Self Feeding;Dressing;Sleep;Lift    Examination-Participation Restrictions Laundry;Cleaning;Community Activity;Driving    Stability/Clinical Decision Making Evolving/Moderate complexity    Clinical Decision Making Moderate    Rehab Potential Good    PT Frequency 2x / week    PT Duration 8 weeks    PT Treatment/Interventions  ADLs/Self Care Home Management;Aquatic Therapy;Moist Heat;Traction;Ultrasound;Cryotherapy;Parrafin;Functional mobility training;Neuromuscular re-education;Taping;Dry needling;Passive range of motion;Joint Manipulations;Spinal Manipulations;Patient/family education;Therapeutic exercise;Manual techniques;Therapeutic activities;Electrical Stimulation;Fluidtherapy;Iontophoresis 4mg /ml Dexamethasone    PT Next Visit Plan Increase cervical ROM; postural restoration    Consulted and Agree with Plan of Care Patient           Patient will benefit from skilled therapeutic intervention in order to improve the following deficits and impairments:  Decreased endurance, Decreased mobility, Pain, Postural dysfunction, Impaired UE functional use, Impaired flexibility, Increased fascial restricitons, Decreased strength, Decreased activity tolerance, Decreased range of motion, Decreased scar mobility, Impaired  tone, Improper body mechanics, Hypomobility  Visit Diagnosis: Muscle weakness (generalized)  Stiffness of right shoulder, not elsewhere classified  Cervicalgia     Problem List There are no problems to display for this patient.  Durwin Reges DPT Durwin Reges 04/26/2020, 11:54 AM  [facility] [address], [place], [address] Phone: [phone]   Fax:  [phone]  Name: Whitney Mclean MRN: [id number] Date of Birth: [birth year]

## 2020-05-10 ENCOUNTER — Ambulatory Visit: Payer: BC Managed Care – PPO | Admitting: Physical Therapy

## 2020-05-11 ENCOUNTER — Ambulatory Visit: Payer: BC Managed Care – PPO | Admitting: Physical Therapy

## 2020-05-16 ENCOUNTER — Other Ambulatory Visit: Payer: Self-pay

## 2020-05-16 ENCOUNTER — Ambulatory Visit: Payer: BC Managed Care – PPO | Admitting: Physical Therapy

## 2020-05-16 ENCOUNTER — Encounter: Payer: Self-pay | Admitting: Physical Therapy

## 2020-05-16 DIAGNOSIS — M6281 Muscle weakness (generalized): Secondary | ICD-10-CM

## 2020-05-16 DIAGNOSIS — M25611 Stiffness of right shoulder, not elsewhere classified: Secondary | ICD-10-CM

## 2020-05-16 DIAGNOSIS — M542 Cervicalgia: Secondary | ICD-10-CM

## 2020-05-16 NOTE — Therapy (Addendum)
[place] [facility] [address], [place], [address] Phone: [phone]   Fax:  [phone]  Physical Therapy Treatment  Patient Details  Name: Whitney Mclean MRN: [id number] Date of Birth: November 08, 1965 No data recorded  Encounter Date: 05/16/2020   PT End of Session - 05/16/20 1121    Visit Number 40    Number of Visits 61    Date for PT Re-Evaluation 06/22/20    Authorization Type [employer] 30appts jan 2021-jan 2022    Authorization - Visit Number 7    Authorization - Number of Visits 30    PT Start Time 9371    PT Stop Time 1155    PT Time Calculation (min) 39 min    Activity Tolerance Patient tolerated treatment well    Behavior During Therapy [facility] for tasks assessed/performed           Past Medical History:  Diagnosis Date  . Oral cancer ([place])   . Scleroderma [facility])     Past Surgical History:  Procedure Laterality Date  . ABDOMINAL HYSTERECTOMY      There were no vitals filed for this visit.   Subjective Assessment - 05/16/20 1119    Subjective Patient is having difficulty with shingles, resports she is negative for the virus now, but that she had to have a biopsy to L axilla which is uncomfortable. Reports her motion is doing well, and compliance with HEP.    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 Shoulder pain started 3 months ago, no mechanism of injury, was getting better with heat. Reports she had pain for a about a month, and for the past 2 months the shoulder has been stiff. Has associated neck stiffness since her surgery last year . 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 Has trouble washing her hair, cannot tie her shoes d/t reaching and hand dysfunction, cannot sit up and hold her head up without neck discomfort  at her neck, cannot dress herself normally. No pain in shoulder/neck over the past week, describes discomfort/stiffness. 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    Limitations Sitting;Writing;House hold activities;Lifting    How long can you sit comfortably? 30    How long can you stand comfortably? unlimited    How long can you walk comfortably? unlimited    Diagnostic tests None on shoulder    Patient Stated Goals Increase shoulder strength and mobility    Pain Onset More than a month ago          THEREX -AA/ROM scapular protraction/retraction and thoracic spine rotation on NuStep; Level32.92mins L4 2.60mins, seat7, SPM60s, Arms level9;cuing for cervical ext to neutral able to hold somewhat - Standing Y on wall 3x 10 with cuing to prevent trunk ext compensation and available cervical ext with good carry over; by  - Lat pull down 20# 3 x10;  Min cuing for availible cervical ext with good carry over - Forward bilat flex on theraball (seated) 2x 30sec hold; 12x 2-3sec hold with min cuing for full stretch with good carry over - Reclined (approx 45d) bilat shoulder flex2# DB 3x10/9/8with cuing to return to as close to 0d as possible, and for scapular retraction into incline with good carry over   Manual Therapy to  reduce pain and tissue tension, improve range of motion, neuromodulation, in order to promote improved ability to complete functional activities. -Passive cervical rotation and lateral bending to L , and ext off edge of mat table with supportsustained holds(able to get upper cervical spine off mat table)                             PT Education - 05/16/20 1120    Education Details therex form/technique    Person(s) Educated Patient    Methods Explanation;Demonstration;Verbal cues    Comprehension Verbalized understanding;Returned demonstration;Verbal cues required            PT Short Term Goals - 09/15/19 1119      PT SHORT  TERM GOAL #1   Title Pt will be independent with HEP in order to improve strength and decrease pain in order to improve pain-free function at home and work.    Baseline 08/04/19 Completing HEP regularly withotu question or concern    Time 4    Period Weeks    Status Achieved             PT Long Term Goals - 11/17/19 1108      PT LONG TERM GOAL #1   Title Pt will decrease quick DASH score by at least 8% in order to demonstrate clinically significant reduction in disability.    Baseline 06/11/19 56.8%; 08/04/19 34%    Time 8    Period Weeks    Status Achieved      PT LONG TERM GOAL #2   Title Patient will demonstrate full cervical ROM in order to drive safely    Baseline ; 11/17/19 R/L rotation 35/25 Sidebending 33/31 ext -6d    Time 8    Period Weeks    Status On-going      PT LONG TERM GOAL #3   Title Patient will demonstrate full R shoulder flex A/PROM in order to complete overhead ADLs    Baseline 11/17/19 AROM flex 120d PROM flex 145; PROM/AROM of ER and IR WNL    Status Revised      PT LONG TERM GOAL #4   Title Patient will demonstrate 4+/5 gross periscapular and shoulder strength in order to complete heavy household ADLs and overhead ADLs    Baseline 11/17/19 R/L shoulder flex 3+/4+ abd 4-/5  ER 3+/4+  IR 4/5 T 3+/4+ Y 2+/3+    Time 8    Period Weeks    Status New                 Plan - 05/16/20 1129    Clinical Impression Statement PT continued therex progression for overhead strength and mobility and cervical posture with success. Pt is able to comply with all cuing for proper technique of therex and increase cervical ROM and skin mobility with manual techniques. PT continue progression to maintain independence and mobility as able.    Personal Factors and Comorbidities Age;Behavior Pattern;Comorbidity 1;Comorbidity 2;Past/Current Experience;Fitness;Sex;Time since onset of injury/illness/exacerbation    Comorbidities scleroderma, cancer    Examination-Activity  Limitations Reach Overhead;Self Feeding;Dressing;Sleep;Lift    Examination-Participation Restrictions Laundry;Cleaning;Community Activity;Driving    Stability/Clinical Decision Making Evolving/Moderate complexity    Clinical Decision Making Moderate    Rehab Potential Good    PT Frequency 2x / week    PT Duration 8 weeks    PT Treatment/Interventions ADLs/Self Care Home Management;Aquatic Therapy;Moist Heat;Traction;Ultrasound;Cryotherapy;Parrafin;Functional mobility training;Neuromuscular re-education;Taping;Dry needling;Passive range of motion;Joint Manipulations;Spinal  Manipulations;Patient/family education;Therapeutic exercise;Manual techniques;Therapeutic activities;Electrical Stimulation;Fluidtherapy;Iontophoresis 4mg /ml Dexamethasone    PT Next Visit Plan Increase cervical ROM; postural restoration    Consulted and Agree with Plan of Care Patient           Patient will benefit from skilled therapeutic intervention in order to improve the following deficits and impairments:  Decreased endurance, Decreased mobility, Pain, Postural dysfunction, Impaired UE functional use, Impaired flexibility, Increased fascial restricitons, Decreased strength, Decreased activity tolerance, Decreased range of motion, Decreased scar mobility, Impaired tone, Improper body mechanics, Hypomobility  Visit Diagnosis: Muscle weakness (generalized)  Stiffness of right shoulder, not elsewhere classified  Cervicalgia     Problem List There are no problems to display for this patient.   Durwin Reges 05/16/2020, 11:59 AM  [place] [facility] [address], [place], [address] Phone: [phone]   Fax:  [phone]  Name: Whitney Mclean MRN: [id number] Date of Birth: 1966-03-06

## 2020-05-24 ENCOUNTER — Other Ambulatory Visit: Payer: Self-pay

## 2020-05-24 ENCOUNTER — Ambulatory Visit: Payer: BC Managed Care – PPO | Attending: Internal Medicine | Admitting: Physical Therapy

## 2020-05-24 ENCOUNTER — Encounter: Payer: Self-pay | Admitting: Physical Therapy

## 2020-05-24 DIAGNOSIS — M6281 Muscle weakness (generalized): Secondary | ICD-10-CM | POA: Diagnosis present

## 2020-05-24 DIAGNOSIS — M542 Cervicalgia: Secondary | ICD-10-CM | POA: Diagnosis present

## 2020-05-24 DIAGNOSIS — M25611 Stiffness of right shoulder, not elsewhere classified: Secondary | ICD-10-CM | POA: Insufficient documentation

## 2020-05-24 NOTE — Therapy (Signed)
[place] [facility] [address], [place], [address] Phone: [phone]   Fax:  [phone]  Physical Therapy Treatment  Patient Details  Name: Whitney Mclean MRN: [id number] Date of Birth: 04-20-1966 No data recorded  Encounter Date: 05/24/2020   PT End of Session - 05/24/20 1121    Visit Number 41    Number of Visits 48    Authorization Type [employer] 30appts jan 2021-jan 2022    Authorization - Visit Number 55    Authorization - Number of Visits 30    PT Start Time 1115    PT Stop Time 1155    PT Time Calculation (min) 40 min    Activity Tolerance Patient tolerated treatment well    Behavior During Therapy [facility] for tasks assessed/performed           Past Medical History:  Diagnosis Date  . Oral cancer ([place])   . Scleroderma [facility])     Past Surgical History:  Procedure Laterality Date  . ABDOMINAL HYSTERECTOMY      There were no vitals filed for this visit.   Subjective Assessment - 05/24/20 1117    Subjective Pt reports she is doing fairly well today. Reports her mobility is better on the L side where the biospy was taken. Reports no pain today.    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 Shoulder pain started 3 months ago, no mechanism of injury, was getting better with heat. Reports she had pain for a about a month, and for the past 2 months the shoulder has been stiff. Has associated neck stiffness since her surgery last year . 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 Has trouble washing her hair, cannot tie her shoes d/t reaching and hand dysfunction, cannot sit up and hold her head up without neck discomfort at her neck, cannot dress herself normally. No pain in shoulder/neck over the past week, describes  discomfort/stiffness. 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    Limitations Sitting;Writing;House hold activities;Lifting    How long can you sit comfortably? 30    How long can you stand comfortably? unlimited    How long can you walk comfortably? unlimited    Diagnostic tests None on shoulder    Patient Stated Goals Increase shoulder strength and mobility    Pain Onset More than a month ago             THEREX -AA/ROM scapular protraction/retraction and thoracic spine rotation on NuStep; Level32.35minsL4 2.64mins, seat7, SPM60s, Arms level9;cuing for cervical ext to neutral able to hold somewhat - Standing Y on wall 3x 10 with good carry over from previous session  - Lat pull down 25# 3 x6;  Min cuing for availible cervical ext with good carry over - Reclined (approx 45d) bilat shoulder flex2# DB 3x10with cuing to return to as close to 0d as possible Reclined bilat shoulder abd 2# DB 3x 10/10/8with cuing for maintained scapular retraction with good carry over   Manual Therapy to reduce pain and tissue tension, improve range of motion, neuromodulation, in order to promote improved ability to complete functional activities. -Passive cervical rotation and lateral bending to L , and ext off edge of mat table with supportsustained holds(able to get upper cervical  spine off mat table)                         PT Education - 05/24/20 1121    Education Details therex form/technique    Person(s) Educated Patient    Methods Explanation;Demonstration;Verbal cues    Comprehension Verbalized understanding;Returned demonstration;Verbal cues required            PT Short Term Goals - 09/15/19 1119      PT SHORT TERM GOAL #1   Title Pt will be independent with HEP in order to improve strength and decrease pain in order to improve pain-free function at home and work.    Baseline 08/04/19 Completing HEP regularly withotu question or concern     Time 4    Period Weeks    Status Achieved             PT Long Term Goals - 11/17/19 1108      PT LONG TERM GOAL #1   Title Pt will decrease quick DASH score by at least 8% in order to demonstrate clinically significant reduction in disability.    Baseline 06/11/19 56.8%; 08/04/19 34%    Time 8    Period Weeks    Status Achieved      PT LONG TERM GOAL #2   Title Patient will demonstrate full cervical ROM in order to drive safely    Baseline ; 11/17/19 R/L rotation 35/25 Sidebending 33/31 ext -6d    Time 8    Period Weeks    Status On-going      PT LONG TERM GOAL #3   Title Patient will demonstrate full R shoulder flex A/PROM in order to complete overhead ADLs    Baseline 11/17/19 AROM flex 120d PROM flex 145; PROM/AROM of ER and IR WNL    Status Revised      PT LONG TERM GOAL #4   Title Patient will demonstrate 4+/5 gross periscapular and shoulder strength in order to complete heavy household ADLs and overhead ADLs    Baseline 11/17/19 R/L shoulder flex 3+/4+ abd 4-/5  ER 3+/4+  IR 4/5 T 3+/4+ Y 2+/3+    Time 8    Period Weeks    Status New                 Plan - 05/24/20 1126    Clinical Impression Statement PT continued therex progression for overhead mobility and strengh, and posture with success. PT cotinued to utilize maual techniques for increased skin mobility with success. PT will continue progression as able    Personal Factors and Comorbidities Age;Behavior Pattern;Comorbidity 1;Comorbidity 2;Past/Current Experience;Fitness;Sex;Time since onset of injury/illness/exacerbation    Comorbidities scleroderma, cancer    Examination-Activity Limitations Reach Overhead;Self Feeding;Dressing;Sleep;Lift    Examination-Participation Restrictions Laundry;Cleaning;Community Activity;Driving    Stability/Clinical Decision Making Evolving/Moderate complexity    Clinical Decision Making Moderate    Rehab Potential Good    PT Frequency 2x / week    PT Duration 8 weeks     PT Treatment/Interventions ADLs/Self Care Home Management;Aquatic Therapy;Moist Heat;Traction;Ultrasound;Cryotherapy;Parrafin;Functional mobility training;Neuromuscular re-education;Taping;Dry needling;Passive range of motion;Joint Manipulations;Spinal Manipulations;Patient/family education;Therapeutic exercise;Manual techniques;Therapeutic activities;Electrical Stimulation;Fluidtherapy;Iontophoresis 4mg /ml Dexamethasone    PT Next Visit Plan Increase cervical ROM; postural restoration    Consulted and Agree with Plan of Care Patient           Patient will benefit from skilled therapeutic intervention in order to improve the following deficits and impairments:  Decreased endurance, Decreased mobility, Pain,  Postural dysfunction, Impaired UE functional use, Impaired flexibility, Increased fascial restricitons, Decreased strength, Decreased activity tolerance, Decreased range of motion, Decreased scar mobility, Impaired tone, Improper body mechanics, Hypomobility  Visit Diagnosis: No diagnosis found.     Problem List There are no problems to display for this patient.  Durwin Reges DPT  Durwin Reges 05/24/2020, 11:55 AM  [place] [facility] [address], [place], [address] Phone: [phone]   Fax:  [phone]  Name: Whitney Mclean MRN: [id number] Date of Birth: 1965-10-02

## 2020-06-07 ENCOUNTER — Other Ambulatory Visit: Payer: Self-pay

## 2020-06-07 ENCOUNTER — Ambulatory Visit: Payer: BC Managed Care – PPO | Admitting: Physical Therapy

## 2020-06-07 DIAGNOSIS — M542 Cervicalgia: Secondary | ICD-10-CM

## 2020-06-07 DIAGNOSIS — M6281 Muscle weakness (generalized): Secondary | ICD-10-CM | POA: Diagnosis not present

## 2020-06-07 DIAGNOSIS — M25611 Stiffness of right shoulder, not elsewhere classified: Secondary | ICD-10-CM

## 2020-06-07 NOTE — Therapy (Signed)
[place] [facility] [address], [place], [address] Phone: [phone]   Fax:  [phone]  Physical Therapy Treatment  Patient Details  Name: Whitney Mclean MRN: [id number] Date of Birth: 10-24-1965 No data recorded  Encounter Date: 06/07/2020   PT End of Session - 06/07/20 1124    Visit Number 42    Number of Visits 94    Date for PT Re-Evaluation 06/22/20    Authorization Type [employer] 30appts jan 2021-jan 2022    Authorization - Visit Number 21    Authorization - Number of Visits 30    PT Start Time 1115    PT Stop Time 1155    PT Time Calculation (min) 40 min    Equipment Utilized During Treatment Gait belt    Activity Tolerance Patient tolerated treatment well    Behavior During Therapy [facility] for tasks assessed/performed           Past Medical History:  Diagnosis Date  . Oral cancer ([place])   . Scleroderma [facility])     Past Surgical History:  Procedure Laterality Date  . ABDOMINAL HYSTERECTOMY      There were no vitals filed for this visit.   Subjective Assessment - 06/07/20 1123    Subjective Patient reports she is having pain where her rash was, but otherwise, doing fairly well. Is continuing HEP to maintain availible motion.    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 Shoulder pain started 3 months ago, no mechanism of injury, was getting better with heat. Reports she had pain for a about a month, and for the past 2 months the shoulder has been stiff. Has associated neck stiffness since her surgery last year . 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 Has trouble washing her hair, cannot tie her shoes d/t reaching and hand dysfunction, cannot sit up and hold her head up without neck discomfort at her neck, cannot dress  herself normally. No pain in shoulder/neck over the past week, describes discomfort/stiffness. 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    Limitations Sitting;Writing;House hold activities;Lifting    How long can you sit comfortably? 30    How long can you stand comfortably? unlimited    How long can you walk comfortably? unlimited    Diagnostic tests None on shoulder    Patient Stated Goals Increase shoulder strength and mobility    Pain Onset More than a month ago           THEREX -AA/ROM scapular protraction/retraction and thoracic spine rotation on NuStep; L4 105mins, seat7, SPM60s, Arms level9;cuing for cervical ext to neutral able to hold somewhat - Standing Y on wall 3x 10 with cuing to prevent ext compensation  - Lat pull down 25#3x7/6/6;Min cuing for availible cervical ext with good carry over - Seated bilat shoulder flex1# DB 3x6 with cuing to prevent thoracic ext compensation with good core activation following cuing; decreased L shoulder height over reps Reclined bilat shoulder abd 2# DB 3x 10 with cuing for maintained scapular retraction with good carry over   Manual Therapy to reduce pain and tissue tension, improve range of motion, neuromodulation, in order to promote improved ability to complete functional activities. -Passive cervical rotation and lateral bending to L ,  and ext off edge of mat table with supportsustained holds(able to get upper cervical spine off mat table)                            PT Education - 06/07/20 1124    Education Details therex form/technique    Person(s) Educated Patient    Methods Explanation;Demonstration;Verbal cues    Comprehension Verbalized understanding;Returned demonstration;Verbal cues required            PT Short Term Goals - 09/15/19 1119      PT SHORT TERM GOAL #1   Title Pt will be independent with HEP in order to improve strength and decrease pain in order to improve  pain-free function at home and work.    Baseline 08/04/19 Completing HEP regularly withotu question or concern    Time 4    Period Weeks    Status Achieved             PT Long Term Goals - 11/17/19 1108      PT LONG TERM GOAL #1   Title Pt will decrease quick DASH score by at least 8% in order to demonstrate clinically significant reduction in disability.    Baseline 06/11/19 56.8%; 08/04/19 34%    Time 8    Period Weeks    Status Achieved      PT LONG TERM GOAL #2   Title Patient will demonstrate full cervical ROM in order to drive safely    Baseline ; 11/17/19 R/L rotation 35/25 Sidebending 33/31 ext -6d    Time 8    Period Weeks    Status On-going      PT LONG TERM GOAL #3   Title Patient will demonstrate full R shoulder flex A/PROM in order to complete overhead ADLs    Baseline 11/17/19 AROM flex 120d PROM flex 145; PROM/AROM of ER and IR WNL    Status Revised      PT LONG TERM GOAL #4   Title Patient will demonstrate 4+/5 gross periscapular and shoulder strength in order to complete heavy household ADLs and overhead ADLs    Baseline 11/17/19 R/L shoulder flex 3+/4+ abd 4-/5  ER 3+/4+  IR 4/5 T 3+/4+ Y 2+/3+    Time 8    Period Weeks    Status New                 Plan - 06/07/20 1128    Clinical Impression Statement PT continued therex progression for strength and mobility, with lawn chair progression to sitting with good carry over. PT continued to utilize manual techniques for increased skin and cervicla mobility. Patient demonstrates good carry over of cuing for core and postural core activation with good motivation throughout session.    Personal Factors and Comorbidities Age;Behavior Pattern;Comorbidity 1;Comorbidity 2;Past/Current Experience;Fitness;Sex;Time since onset of injury/illness/exacerbation    Comorbidities scleroderma, cancer    Examination-Activity Limitations Reach Overhead;Self Feeding;Dressing;Sleep;Lift    Examination-Participation  Restrictions Laundry;Cleaning;Community Activity;Driving    Stability/Clinical Decision Making Evolving/Moderate complexity    Clinical Decision Making Moderate    Rehab Potential Good    PT Frequency 2x / week    PT Duration 8 weeks    PT Treatment/Interventions ADLs/Self Care Home Management;Aquatic Therapy;Moist Heat;Traction;Ultrasound;Cryotherapy;Parrafin;Functional mobility training;Neuromuscular re-education;Taping;Dry needling;Passive range of motion;Joint Manipulations;Spinal Manipulations;Patient/family education;Therapeutic exercise;Manual techniques;Therapeutic activities;Electrical Stimulation;Fluidtherapy;Iontophoresis 4mg /ml Dexamethasone    PT Next Visit Plan Increase cervical ROM; postural restoration    Consulted and Agree with Plan of  Care Patient           Patient will benefit from skilled therapeutic intervention in order to improve the following deficits and impairments:  Decreased endurance, Decreased mobility, Pain, Postural dysfunction, Impaired UE functional use, Impaired flexibility, Increased fascial restricitons, Decreased strength, Decreased activity tolerance, Decreased range of motion, Decreased scar mobility, Impaired tone, Improper body mechanics, Hypomobility  Visit Diagnosis: Muscle weakness (generalized)  Stiffness of right shoulder, not elsewhere classified  Cervicalgia     Problem List There are no problems to display for this patient.  Durwin Reges DPT Durwin Reges 06/07/2020, 11:47 AM  [place] [facility] [address] [place], [address] Phone: [phone]   Fax:  [phone]  Name: Whitney Mclean MRN: [id number] Date of Birth: [birth year]

## 2020-06-19 ENCOUNTER — Other Ambulatory Visit: Payer: Self-pay

## 2020-06-19 ENCOUNTER — Ambulatory Visit: Payer: BC Managed Care – PPO

## 2020-06-19 DIAGNOSIS — M6281 Muscle weakness (generalized): Secondary | ICD-10-CM | POA: Diagnosis not present

## 2020-06-19 DIAGNOSIS — M25611 Stiffness of right shoulder, not elsewhere classified: Secondary | ICD-10-CM

## 2020-06-19 DIAGNOSIS — M542 Cervicalgia: Secondary | ICD-10-CM

## 2020-06-19 NOTE — Therapy (Signed)
[place] [facility] [address], [place], [address] Phone: [phone]   Fax:  [phone]  Physical Therapy Treatment  Patient Details  Name: Whitney Mclean MRN: [id number] Date of Birth: 07/02/1966 No data recorded  Encounter Date: 06/19/2020   PT End of Session - 06/19/20 1723    Visit Number 43    Number of Visits 76    Date for PT Re-Evaluation 06/22/20    Authorization Type [employer] 30appts jan 2021-jan 2022    Authorization - Visit Number 19    Authorization - Number of Visits 30    PT Start Time 1430    PT Stop Time 1515    PT Time Calculation (min) 45 min    Equipment Utilized During Treatment Gait belt    Activity Tolerance Patient tolerated treatment well    Behavior During Therapy WFL for tasks assessed/performed           Past Medical History:  Diagnosis Date  . Oral cancer ([place])   . Scleroderma [facility])     Past Surgical History:  Procedure Laterality Date  . ABDOMINAL HYSTERECTOMY      There were no vitals filed for this visit.   Subjective Assessment - 06/19/20 1721    Subjective pt fell over a gate set up for her dogs and broke 4 teeth.  She reports her neck and shoulders did not get flared up from this.    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 Shoulder pain started 3 months ago, no mechanism of injury, was getting better with heat. Reports she had pain for a about a month, and for the past 2 months the shoulder has been stiff. Has associated neck stiffness since her surgery last year . 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 Has trouble washing her hair, cannot tie her shoes d/t reaching and hand dysfunction, cannot sit up and hold her head up without neck discomfort at her neck, cannot dress herself  normally. No pain in shoulder/neck over the past week, describes discomfort/stiffness. 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    Limitations Sitting;Writing;House hold activities;Lifting    How long can you sit comfortably? 30    How long can you stand comfortably? unlimited    How long can you walk comfortably? unlimited    Diagnostic tests None on shoulder    Patient Stated Goals Increase shoulder strength and mobility    Currently in Pain? No/denies    Pain Score 0-No pain    Pain Onset More than a month ago             THEREX - AA/ROM scapular protraction/retraction and thoracic spine rotation on NuStep; L4 1mins, seat7, SPM 60s, Arms level 9; cuing for cervical ext to neutral able to hold somewhat - Standing Y on wall 3x 10 with cuing to prevent ext compensation  - Lat pull down 25# 3 x7/7/7;  Min cuing for available cervical ext with good carry over - Seated bilat shoulder flex 1# DB 3x 6 with cuing to prevent thoracic ext compensation with good core activation following cuing; decreased L shoulder height over reps Reclined bilat shoulder abd 2# DB 3x 10 with cuing for maintained scapular retraction with good carry over  Manual Therapy  to reduce pain and tissue tension, improve range of motion, neuromodulation, in order to promote improved ability to complete functional activities. - Passive cervical rotation and lateral bending to L , and ext off edge of mat table with support sustained holds (able to get upper cervical spine off mat table)                            PT Education - 06/19/20 1722    Education Details therex form and technique    Person(s) Educated Patient    Methods Tactile cues;Demonstration;Explanation    Comprehension Returned demonstration;Verbal cues required;Verbalized understanding            PT Short Term Goals - 09/15/19 1119      PT SHORT TERM GOAL #1   Title Pt will be independent with HEP in order to  improve strength and decrease pain in order to improve pain-free function at home and work.    Baseline 08/04/19 Completing HEP regularly withotu question or concern    Time 4    Period Weeks    Status Achieved             PT Long Term Goals - 11/17/19 1108      PT LONG TERM GOAL #1   Title Pt will decrease quick DASH score by at least 8% in order to demonstrate clinically significant reduction in disability.    Baseline 06/11/19 56.8%; 08/04/19 34%    Time 8    Period Weeks    Status Achieved      PT LONG TERM GOAL #2   Title Patient will demonstrate full cervical ROM in order to drive safely    Baseline ; 11/17/19 R/L rotation 35/25 Sidebending 33/31 ext -6d    Time 8    Period Weeks    Status On-going      PT LONG TERM GOAL #3   Title Patient will demonstrate full R shoulder flex A/PROM in order to complete overhead ADLs    Baseline 11/17/19 AROM flex 120d PROM flex 145; PROM/AROM of ER and IR WNL    Status Revised      PT LONG TERM GOAL #4   Title Patient will demonstrate 4+/5 gross periscapular and shoulder strength in order to complete heavy household ADLs and overhead ADLs    Baseline 11/17/19 R/L shoulder flex 3+/4+ abd 4-/5  ER 3+/4+  IR 4/5 T 3+/4+ Y 2+/3+    Time 8    Period Weeks    Status New                 Plan - 06/19/20 1724    Clinical Impression Statement Pt remains motivated to participate during PT session today.  She tolerated manual therapy techniques for skin and cervical spine mobility well.  Overall, she was challenged with postural mm retraining and should benefit from skilled PT to further progress exercises as appropriate.    Personal Factors and Comorbidities Age;Behavior Pattern;Comorbidity 1;Comorbidity 2;Past/Current Experience;Fitness;Sex;Time since onset of injury/illness/exacerbation    Comorbidities scleroderma, cancer    Examination-Activity Limitations Reach Overhead;Self Feeding;Dressing;Sleep;Lift    Examination-Participation  Restrictions Laundry;Cleaning;Community Activity;Driving    Stability/Clinical Decision Making Evolving/Moderate complexity    Rehab Potential Good    PT Frequency 2x / week    PT Duration 8 weeks    PT Treatment/Interventions ADLs/Self Care Home Management;Aquatic Therapy;Moist Heat;Traction;Ultrasound;Cryotherapy;Parrafin;Functional mobility training;Neuromuscular re-education;Taping;Dry needling;Passive range of motion;Joint Manipulations;Spinal Manipulations;Patient/family education;Therapeutic exercise;Manual  techniques;Therapeutic activities;Electrical Stimulation;Fluidtherapy;Iontophoresis 4mg /ml Dexamethasone    PT Next Visit Plan Increase cervical ROM; postural restoration    Consulted and Agree with Plan of Care Patient           Patient will benefit from skilled therapeutic intervention in order to improve the following deficits and impairments:  Decreased endurance, Decreased mobility, Pain, Postural dysfunction, Impaired UE functional use, Impaired flexibility, Increased fascial restricitons, Decreased strength, Decreased activity tolerance, Decreased range of motion, Decreased scar mobility, Impaired tone, Improper body mechanics, Hypomobility  Visit Diagnosis: Muscle weakness (generalized)  Stiffness of right shoulder, not elsewhere classified  Cervicalgia     Problem List There are no problems to display for this patient.   Pincus Badder 06/19/2020, 5:29 PM Merdis Delay, PT, DPT Physical Therapist - [place] [facility] [address], [place], [address] Phone: [phone]   Fax:  [phone]  Name: MARITSA HUNSUCKER MRN: [id number] Date of Birth: 1966-02-22

## 2020-07-05 ENCOUNTER — Encounter: Payer: Self-pay | Admitting: Physical Therapy

## 2020-07-05 ENCOUNTER — Ambulatory Visit: Payer: BC Managed Care – PPO | Attending: Internal Medicine | Admitting: Physical Therapy

## 2020-07-05 ENCOUNTER — Other Ambulatory Visit: Payer: Self-pay

## 2020-07-05 DIAGNOSIS — M6281 Muscle weakness (generalized): Secondary | ICD-10-CM

## 2020-07-05 DIAGNOSIS — M542 Cervicalgia: Secondary | ICD-10-CM | POA: Diagnosis present

## 2020-07-05 DIAGNOSIS — M25611 Stiffness of right shoulder, not elsewhere classified: Secondary | ICD-10-CM | POA: Diagnosis present

## 2020-07-05 DIAGNOSIS — M79641 Pain in right hand: Secondary | ICD-10-CM | POA: Diagnosis present

## 2020-07-05 NOTE — Therapy (Signed)
[place] [facility] [address] [place], [address] Phone: [phone]   Fax:  [phone]  Physical Therapy Treatment  Patient Details  Name: Whitney Mclean MRN: [id number] Date of Birth: 1965-10-04 No data recorded  Encounter Date: 07/05/2020   PT End of Session - 07/05/20 1139    Visit Number 44    Number of Visits 55    Date for PT Re-Evaluation 06/22/20    Authorization Type [employer] 30appts jan 2021-jan 2022    Authorization - Visit Number 23    Authorization - Number of Visits 30    PT Start Time 1115    PT Stop Time 1155    PT Time Calculation (min) 40 min    Activity Tolerance Patient tolerated treatment well    Behavior During Therapy [facility] for tasks assessed/performed           Past Medical History:  Diagnosis Date  . Oral cancer ([place])   . Scleroderma [facility])     Past Surgical History:  Procedure Laterality Date  . ABDOMINAL HYSTERECTOMY      There were no vitals filed for this visit.   Subjective Assessment - 07/05/20 1120    Subjective Patient reports she saw her rheumatologist who was impressed with her UE ROM. REports no issues or changes today.    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 Shoulder pain started 3 months ago, no mechanism of injury, was getting better with heat. Reports she had pain for a about a month, and for the past 2 months the shoulder has been stiff. Has associated neck stiffness since her surgery last year . 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 Has trouble washing her hair, cannot tie her shoes d/t reaching and hand dysfunction, cannot sit up and hold her head up without neck discomfort at her neck, cannot dress herself normally. No pain in shoulder/neck over the past week, describes  discomfort/stiffness. 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    Limitations Sitting;Writing;House hold activities;Lifting    How long can you sit comfortably? 30    How long can you stand comfortably? unlimited    How long can you walk comfortably? unlimited    Diagnostic tests None on shoulder    Patient Stated Goals Increase shoulder strength and mobility    Pain Onset More than a month ago           THEREX -AA/ROM scapular protraction/retraction and thoracic spine rotation on NuStep; L4 104mins, seat7, SPM60s, Arms level9;cuing for cervical ext to neutral able to hold somewhat - Standing Y on wall x10 withcuing to prevent thoracic ext compensation  - Lat pull down 25#3x8/7/6;Min cuing for availible cervical ext with good carry over - Seated bilat shoulder flex1# DB 3x6 patient able to flex approx 110d with patient able to control lower following; with cuing to prevent thoracic ext compensation with good core activation following cuing;  Seated abd x10; 1# DB in each hand 2x 6 with difficulty maintaining scapular retraction   Manual Therapy to reduce pain and tissue tension, improve range of motion, neuromodulation, in order to promote improved ability to complete functional activities. -Passive cervical rotation and lateral bending to L , and ext off edge of mat table with  supportsustained holds(able to get upper cervical spine off mat table)           PT Education - 07/05/20 1137    Education Details therex form/technique    Person(s) Educated Patient    Methods Explanation;Demonstration;Verbal cues    Comprehension Verbalized understanding;Returned demonstration;Verbal cues required            PT Short Term Goals - 09/15/19 1119      PT SHORT TERM GOAL #1   Title Pt will be independent with HEP in order to improve strength and decrease pain in order to improve pain-free function at home and work.    Baseline 08/04/19 Completing HEP  regularly withotu question or concern    Time 4    Period Weeks    Status Achieved             PT Long Term Goals - 11/17/19 1108      PT LONG TERM GOAL #1   Title Pt will decrease quick DASH score by at least 8% in order to demonstrate clinically significant reduction in disability.    Baseline 06/11/19 56.8%; 08/04/19 34%    Time 8    Period Weeks    Status Achieved      PT LONG TERM GOAL #2   Title Patient will demonstrate full cervical ROM in order to drive safely    Baseline ; 11/17/19 R/L rotation 35/25 Sidebending 33/31 ext -6d    Time 8    Period Weeks    Status On-going      PT LONG TERM GOAL #3   Title Patient will demonstrate full R shoulder flex A/PROM in order to complete overhead ADLs    Baseline 11/17/19 AROM flex 120d PROM flex 145; PROM/AROM of ER and IR WNL    Status Revised      PT LONG TERM GOAL #4   Title Patient will demonstrate 4+/5 gross periscapular and shoulder strength in order to complete heavy household ADLs and overhead ADLs    Baseline 11/17/19 R/L shoulder flex 3+/4+ abd 4-/5  ER 3+/4+  IR 4/5 T 3+/4+ Y 2+/3+    Time 8    Period Weeks    Status New                 Plan - 07/05/20 1143    Clinical Impression Statement PT continued progression for increased overhead and cervical mobility with success. Patient iscontinuing progression of overhead UE mobility toward upright positions. Pt is motivated throughout session with no increased pain. PT will continue progression as able.    Personal Factors and Comorbidities Age;Behavior Pattern;Comorbidity 1;Comorbidity 2;Past/Current Experience;Fitness;Sex;Time since onset of injury/illness/exacerbation    Comorbidities scleroderma, cancer    Examination-Activity Limitations Reach Overhead;Self Feeding;Dressing;Sleep;Lift    Examination-Participation Restrictions Laundry;Cleaning;Community Activity;Driving    Stability/Clinical Decision Making Evolving/Moderate complexity    Clinical Decision  Making Moderate    Rehab Potential Good    PT Frequency 2x / week    PT Duration 8 weeks    PT Treatment/Interventions ADLs/Self Care Home Management;Aquatic Therapy;Moist Heat;Traction;Ultrasound;Cryotherapy;Parrafin;Functional mobility training;Neuromuscular re-education;Taping;Dry needling;Passive range of motion;Joint Manipulations;Spinal Manipulations;Patient/family education;Therapeutic exercise;Manual techniques;Therapeutic activities;Electrical Stimulation;Fluidtherapy;Iontophoresis 4mg /ml Dexamethasone    PT Next Visit Plan Increase cervical ROM; postural restoration    Consulted and Agree with Plan of Care Patient           Patient will benefit from skilled therapeutic intervention in order to improve the following deficits and impairments:  Decreased endurance, Decreased mobility, Pain, Postural dysfunction, Impaired  UE functional use, Impaired flexibility, Increased fascial restricitons, Decreased strength, Decreased activity tolerance, Decreased range of motion, Decreased scar mobility, Impaired tone, Improper body mechanics, Hypomobility  Visit Diagnosis: Muscle weakness (generalized)  Stiffness of right shoulder, not elsewhere classified  Cervicalgia     Problem List There are no problems to display for this patient.  Durwin Reges DPT Durwin Reges 07/05/2020, 11:55 AM  [place] [facility] [address], [place], [address] Phone: [phone]   Fax:  [phone]  Name: Whitney Mclean MRN: [id number] Date of Birth: [birth year]

## 2020-07-18 ENCOUNTER — Ambulatory Visit: Payer: BC Managed Care – PPO | Admitting: Physical Therapy

## 2020-07-19 ENCOUNTER — Encounter: Payer: Self-pay | Admitting: Physical Therapy

## 2020-07-19 ENCOUNTER — Other Ambulatory Visit: Payer: Self-pay

## 2020-07-19 ENCOUNTER — Ambulatory Visit: Payer: BC Managed Care – PPO | Admitting: Physical Therapy

## 2020-07-19 DIAGNOSIS — M542 Cervicalgia: Secondary | ICD-10-CM

## 2020-07-19 DIAGNOSIS — M79641 Pain in right hand: Secondary | ICD-10-CM

## 2020-07-19 DIAGNOSIS — M25611 Stiffness of right shoulder, not elsewhere classified: Secondary | ICD-10-CM

## 2020-07-19 DIAGNOSIS — M6281 Muscle weakness (generalized): Secondary | ICD-10-CM | POA: Diagnosis not present

## 2020-07-19 NOTE — Therapy (Signed)
[facility] [address] [place], [address] Phone: [phone]   Fax:  [phone]  Physical Therapy Treatment  Patient Details  Name: Whitney Mclean MRN: [id number] Date of Birth: 06-14-1966 No data recorded  Encounter Date: 07/19/2020   PT End of Session - 07/19/20 1115    Visit Number 45    Number of Visits 53    Date for PT Re-Evaluation 06/22/20    Authorization - Visit Number 24    Authorization - Number of Visits 30    PT Start Time 1110    PT Stop Time 1150    PT Time Calculation (min) 40 min    Equipment Utilized During Treatment Gait belt    Activity Tolerance Patient tolerated treatment well    Behavior During Therapy [facility] for tasks assessed/performed           Past Medical History:  Diagnosis Date  . Oral cancer ([place])   . Scleroderma [facility])     Past Surgical History:  Procedure Laterality Date  . ABDOMINAL HYSTERECTOMY      There were no vitals filed for this visit.   Subjective Assessment - 07/19/20 1110    Subjective Continuing HEP and is happy with maintenance of mobility and motion.    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 Shoulder pain started 3 months ago, no mechanism of injury, was getting better with heat. Reports she had pain for a about a month, and for the past 2 months the shoulder has been stiff. Has associated neck stiffness since her surgery last year . 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 Has trouble washing her hair, cannot tie her shoes d/t reaching and hand dysfunction, cannot sit up and hold her head up without neck discomfort at her neck, cannot dress herself normally. No pain in shoulder/neck over the past week, describes discomfort/stiffness. Is seeing OT bilat hands d/t  scleraderma stiffness.    Limitations Sitting;Writing;House hold activities;Lifting    How long can you sit comfortably? 30    How long can you stand comfortably? unlimited    How long can you walk comfortably? unlimited    Diagnostic tests None on shoulder    Patient Stated Goals Increase shoulder strength and mobility    Pain Onset More than a month ago           THEREX -AA/ROM scapular protraction/retraction and thoracic spine rotation on NuStep; L4 71mins, seat7, SPM60s, Arms level9;cuing for cervical ext to neutral able to hold somewhat - Standing Y on wall 3  x12 withcuing to prevent thoracic ext compensation - Lat pull down 25#3x8/7/6;Min cuing for availible cervical ext with good carry over -Seatedbilat shoulder flex1# DB 3x8 inc height approx 140d; with cuing to prevent thoracic ext compensation with good core activation following cuing;  Seated abd x10; 1# DB in each hand 2x 6 with difficulty maintaining scapular retraction   Manual Therapy to reduce pain and tissue tension, improve range of motion, neuromodulation, in order to promote improved ability to complete functional activities. -Passive cervical rotation and lateral bending to L , and ext off edge of mat table with supportsustained holds(able to get upper cervical spine off mat table)  PT Education - 07/19/20 1114    Education Details therex form/technique    Person(s) Educated Patient    Methods Explanation;Demonstration;Verbal cues    Comprehension Verbalized understanding;Returned demonstration;Verbal cues required            PT Short Term Goals - 09/15/19 1119      PT SHORT TERM GOAL #1   Title Pt will be independent with HEP in order to improve strength and decrease pain in order to improve pain-free function at home and work.    Baseline 08/04/19 Completing HEP regularly withotu question or concern    Time 4    Period Weeks    Status Achieved             PT  Long Term Goals - 11/17/19 1108      PT LONG TERM GOAL #1   Title Pt will decrease quick DASH score by at least 8% in order to demonstrate clinically significant reduction in disability.    Baseline 06/11/19 56.8%; 08/04/19 34%    Time 8    Period Weeks    Status Achieved      PT LONG TERM GOAL #2   Title Patient will demonstrate full cervical ROM in order to drive safely    Baseline ; 11/17/19 R/L rotation 35/25 Sidebending 33/31 ext -6d    Time 8    Period Weeks    Status On-going      PT LONG TERM GOAL #3   Title Patient will demonstrate full R shoulder flex A/PROM in order to complete overhead ADLs    Baseline 11/17/19 AROM flex 120d PROM flex 145; PROM/AROM of ER and IR WNL    Status Revised      PT LONG TERM GOAL #4   Title Patient will demonstrate 4+/5 gross periscapular and shoulder strength in order to complete heavy household ADLs and overhead ADLs    Baseline 11/17/19 R/L shoulder flex 3+/4+ abd 4-/5  ER 3+/4+  IR 4/5 T 3+/4+ Y 2+/3+    Time 8    Period Weeks    Status New                 Plan - 07/19/20 1125    Clinical Impression Statement PT continued progression for overhead and cervical mobility, and strength with success. patient is continuing to maintain good mobility with PT, and is able to comply with all cuing for proper techniques. Patient is motivated throughout session without increased pain. PT will continue progression as able.    Personal Factors and Comorbidities Age;Behavior Pattern;Comorbidity 1;Comorbidity 2;Past/Current Experience;Fitness;Sex;Time since onset of injury/illness/exacerbation    Comorbidities scleroderma, cancer    Examination-Activity Limitations Reach Overhead;Self Feeding;Dressing;Sleep;Lift    Examination-Participation Restrictions Laundry;Cleaning;Community Activity;Driving    Stability/Clinical Decision Making Evolving/Moderate complexity    Clinical Decision Making Moderate    Rehab Potential Good    PT Frequency 2x / week     PT Duration 8 weeks    PT Treatment/Interventions ADLs/Self Care Home Management;Aquatic Therapy;Moist Heat;Traction;Ultrasound;Cryotherapy;Parrafin;Functional mobility training;Neuromuscular re-education;Taping;Dry needling;Passive range of motion;Joint Manipulations;Spinal Manipulations;Patient/family education;Therapeutic exercise;Manual techniques;Therapeutic activities;Electrical Stimulation;Fluidtherapy;Iontophoresis 4mg /ml Dexamethasone    PT Next Visit Plan Increase cervical ROM; postural restoration    Consulted and Agree with Plan of Care Patient           Patient will benefit from skilled therapeutic intervention in order to improve the following deficits and impairments:  Decreased endurance, Decreased mobility, Pain, Postural dysfunction, Impaired UE functional use, Impaired flexibility, Increased fascial restricitons, Decreased strength,  Decreased activity tolerance, Decreased range of motion, Decreased scar mobility, Impaired tone, Improper body mechanics, Hypomobility  Visit Diagnosis: Muscle weakness (generalized)  Stiffness of right shoulder, not elsewhere classified  Cervicalgia  Pain in right hand     Problem List There are no problems to display for this patient.  Durwin Reges DPT Durwin Reges 07/19/2020, 11:50 AM  [place] [facility] [address], [place], [address] Phone: [phone]   Fax:  [phone]  Name: Whitney Mclean MRN: [id number] Date of Birth: 1965/11/16

## 2020-08-02 ENCOUNTER — Other Ambulatory Visit: Payer: Self-pay

## 2020-08-02 ENCOUNTER — Encounter: Payer: Self-pay | Admitting: Physical Therapy

## 2020-08-02 ENCOUNTER — Ambulatory Visit: Payer: BC Managed Care – PPO | Attending: Internal Medicine | Admitting: Physical Therapy

## 2020-08-02 DIAGNOSIS — M542 Cervicalgia: Secondary | ICD-10-CM | POA: Diagnosis present

## 2020-08-02 DIAGNOSIS — M6281 Muscle weakness (generalized): Secondary | ICD-10-CM

## 2020-08-02 DIAGNOSIS — M25611 Stiffness of right shoulder, not elsewhere classified: Secondary | ICD-10-CM | POA: Diagnosis present

## 2020-08-02 NOTE — Therapy (Signed)
[place] [facility] [address] [place], [address] Phone: [phone]   Fax:  [phone]  Physical Therapy Treatment  Patient Details  Name: Whitney Mclean MRN: [id number] Date of Birth: 1966/08/07 No data recorded  Encounter Date: 08/02/2020   PT End of Session - 08/02/20 1131    Visit Number 46    Number of Visits 53    Date for PT Re-Evaluation 09/22/20    Authorization - Visit Number 25    Authorization - Number of Visits 30    PT Start Time 1120    PT Stop Time 1200    PT Time Calculation (min) 40 min    Equipment Utilized During Treatment Gait belt    Activity Tolerance Patient tolerated treatment well    Behavior During Therapy [facility] for tasks assessed/performed           Past Medical History:  Diagnosis Date  . Oral cancer ([place])   . Scleroderma [facility])     Past Surgical History:  Procedure Laterality Date  . ABDOMINAL HYSTERECTOMY      There were no vitals filed for this visit.   Subjective Assessment - 08/02/20 1122    Subjective Continued HEP maintenance, nothing new to note.    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 Shoulder pain started 3 months ago, no mechanism of injury, was getting better with heat. Reports she had pain for a about a month, and for the past 2 months the shoulder has been stiff. Has associated neck stiffness since her surgery last year . 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 Has trouble washing her hair, cannot tie her shoes d/t reaching and hand dysfunction, cannot sit up and hold her head up without neck discomfort at her neck, cannot dress herself normally. No pain in shoulder/neck over the past week, describes discomfort/stiffness. 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     Limitations Sitting;Writing;House hold activities;Lifting    How long can you sit comfortably? 30    How long can you stand comfortably? unlimited    How long can you walk comfortably? unlimited    Diagnostic tests None on shoulder    Patient Stated Goals Increase shoulder strength and mobility    Pain Onset More than a month ago          THEREX -AA/ROM scapular protraction/retraction and thoracic spine rotation on NuStep; L4 42mins, seat7, SPM60s, Arms level9;cuing for cervical ext to neutral able to hold somewhat - Standing Y on wallwith 1# DB 3x 8 with min cuing for available cervical neutral with good carry over  - Lat pull down 25#x8; 20# 2x 10Min cuing for availible cervical ext with good carry over -Seatedbilat shoulder flexagainst wall1# DB 3x8with min cuing for cervical ext to touch wall, and scapular touch to wall with good carry over - Same set up as above for shoulder abd 2x 8 1# DB with good carry over of technique, some protraction   Manual Therapy to reduce pain and tissue tension, improve range of motion, neuromodulation, in order to promote improved ability to complete functional activities. -Passive cervical rotation and lateral bending to L , and ext off edge of mat table with supportsustained holds(able to get upper cervical spine off  mat table)     PT Education - 08/02/20 1130    Education Details therex form/technique    Person(s) Educated Patient    Methods Explanation;Demonstration;Verbal cues    Comprehension Verbalized understanding;Returned demonstration;Verbal cues required            PT Short Term Goals - 09/15/19 1119      PT SHORT TERM GOAL #1   Title Pt will be independent with HEP in order to improve strength and decrease pain in order to improve pain-free function at home and work.    Baseline 08/04/19 Completing HEP regularly withotu question or concern    Time 4    Period Weeks    Status Achieved             PT  Long Term Goals - 11/17/19 1108      PT LONG TERM GOAL #1   Title Pt will decrease quick DASH score by at least 8% in order to demonstrate clinically significant reduction in disability.    Baseline 06/11/19 56.8%; 08/04/19 34%    Time 8    Period Weeks    Status Achieved      PT LONG TERM GOAL #2   Title Patient will demonstrate full cervical ROM in order to drive safely    Baseline ; 11/17/19 R/L rotation 35/25 Sidebending 33/31 ext -6d    Time 8    Period Weeks    Status On-going      PT LONG TERM GOAL #3   Title Patient will demonstrate full R shoulder flex A/PROM in order to complete overhead ADLs    Baseline 11/17/19 AROM flex 120d PROM flex 145; PROM/AROM of ER and IR WNL    Status Revised      PT LONG TERM GOAL #4   Title Patient will demonstrate 4+/5 gross periscapular and shoulder strength in order to complete heavy household ADLs and overhead ADLs    Baseline 11/17/19 R/L shoulder flex 3+/4+ abd 4-/5  ER 3+/4+  IR 4/5 T 3+/4+ Y 2+/3+    Time 8    Period Weeks    Status New                 Plan - 08/02/20 1154    Clinical Impression Statement PT continued progression for overhead mobility and strength and maintenance of availible ROM for neutral posture with success. Patient is motivated throughout session with no increased pain throughout session. PT will continue progression as able.    Personal Factors and Comorbidities Age;Behavior Pattern;Comorbidity 1;Comorbidity 2;Past/Current Experience;Fitness;Sex;Time since onset of injury/illness/exacerbation    Comorbidities scleroderma, cancer    Examination-Activity Limitations Reach Overhead;Self Feeding;Dressing;Sleep;Lift    Examination-Participation Restrictions Laundry;Cleaning;Community Activity;Driving    Stability/Clinical Decision Making Evolving/Moderate complexity    Clinical Decision Making Moderate    Rehab Potential Good    PT Frequency 2x / week    PT Duration 8 weeks    PT Treatment/Interventions  ADLs/Self Care Home Management;Aquatic Therapy;Moist Heat;Traction;Ultrasound;Cryotherapy;Parrafin;Functional mobility training;Neuromuscular re-education;Taping;Dry needling;Passive range of motion;Joint Manipulations;Spinal Manipulations;Patient/family education;Therapeutic exercise;Manual techniques;Therapeutic activities;Electrical Stimulation;Fluidtherapy;Iontophoresis 4mg /ml Dexamethasone    PT Next Visit Plan Increase cervical ROM; postural restoration    Consulted and Agree with Plan of Care Patient           Patient will benefit from skilled therapeutic intervention in order to improve the following deficits and impairments:  Decreased endurance, Decreased mobility, Pain, Postural dysfunction, Impaired UE functional use, Impaired flexibility, Increased fascial restricitons, Decreased strength, Decreased activity tolerance, Decreased range  of motion, Decreased scar mobility, Impaired tone, Improper body mechanics, Hypomobility  Visit Diagnosis: Muscle weakness (generalized)  Stiffness of right shoulder, not elsewhere classified  Cervicalgia     Problem List There are no problems to display for this patient.  Durwin Reges DPT  Durwin Reges 08/02/2020, 12:46 PM  [facility] [address] [place], [address] Phone: [phone]   Fax:  [phone]  Name: Whitney Mclean MRN: [id number] Date of Birth: [birth year]

## 2020-08-15 ENCOUNTER — Other Ambulatory Visit: Payer: Self-pay

## 2020-08-15 ENCOUNTER — Encounter: Payer: Self-pay | Admitting: Physical Therapy

## 2020-08-15 ENCOUNTER — Ambulatory Visit: Payer: BC Managed Care – PPO | Admitting: Physical Therapy

## 2020-08-15 DIAGNOSIS — M6281 Muscle weakness (generalized): Secondary | ICD-10-CM | POA: Diagnosis not present

## 2020-08-15 DIAGNOSIS — M542 Cervicalgia: Secondary | ICD-10-CM

## 2020-08-15 DIAGNOSIS — M25611 Stiffness of right shoulder, not elsewhere classified: Secondary | ICD-10-CM

## 2020-08-15 NOTE — Therapy (Signed)
[facility] [address] [place], [address] Phone: [phone]   Fax:  [phone]  Physical Therapy Treatment  Patient Details  Name: Whitney Mclean MRN: [id number] Date of Birth: [birth year] No data recorded  Encounter Date: 08/15/2020   PT End of Session - 08/15/20 1117    Visit Number 47    Number of Visits 53    Date for PT Re-Evaluation 09/22/20    Authorization Type [employer] 30appts jan 2021-jan 2022    Authorization - Visit Number 72    Authorization - Number of Visits 30    PT Start Time 1113    PT Stop Time 1154    PT Time Calculation (min) 41 min    Activity Tolerance Patient tolerated treatment well    Behavior During Therapy [facility] for tasks assessed/performed           Past Medical History:  Diagnosis Date  . Oral cancer ([place])   . Scleroderma [facility])     Past Surgical History:  Procedure Laterality Date  . ABDOMINAL HYSTERECTOMY      There were no vitals filed for this visit.   Subjective Assessment - 08/15/20 1113    Subjective Continued HEP maintenance, nothing new to note.    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 Shoulder pain started 3 months ago, no mechanism of injury, was getting better with heat. Reports she had pain for a about a month, and for the past 2 months the shoulder has been stiff. Has associated neck stiffness since her surgery last year . 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 Has trouble washing her hair, cannot tie her shoes d/t reaching and hand dysfunction, cannot sit up and hold her head up without neck discomfort at her neck, cannot dress herself normally. No pain in shoulder/neck over the past week, describes discomfort/stiffness. Is seeing OT bilat hands d/t scleraderma  stiffness.    Limitations Sitting;Writing;House hold activities;Lifting    How long can you sit comfortably? 30    How long can you stand comfortably? unlimited    How long can you walk comfortably? unlimited    Diagnostic tests None on shoulder    Patient Stated Goals Increase shoulder strength and mobility    Pain Onset More than a month ago             THEREX -AA/ROM scapular protraction/retraction and thoracic spine rotation on NuStep; L4 41mins, seat7, SPM60s, Arms level9;cuing for cervical ext to neutral able to hold somewhat - Standing Y on wallwith 1# DB 3x 8 with min cuing for available cervical neutral with good carry over  - Lat pull down 25#3x 8/8/7Min cuing for availible cervical ext with good carry over -Seatedbilat shoulder flexagainst wall1# DB 3x8with min cuing for cervical ext to touch wall, and scapular touch to wall with good carry over - Same set up as above for shoulder abd 2x 8 1# DB with good carry over of technique, some protraction   Manual Therapy to reduce pain and tissue tension, improve range of motion, neuromodulation, in order to promote improved ability to complete functional activities. -Passive cervical rotation and lateral bending to L , and ext off edge of mat table with supportsustained holds(able to get upper cervical  spine off mat table)       PT Education - 08/15/20 1116    Education Details therex form/technique    Person(s) Educated Patient    Methods Explanation;Demonstration;Verbal cues    Comprehension Verbalized understanding;Returned demonstration;Verbal cues required            PT Short Term Goals - 09/15/19 1119      PT SHORT TERM GOAL #1   Title Pt will be independent with HEP in order to improve strength and decrease pain in order to improve pain-free function at home and work.    Baseline 08/04/19 Completing HEP regularly withotu question or concern    Time 4    Period Weeks    Status Achieved              PT Long Term Goals - 11/17/19 1108      PT LONG TERM GOAL #1   Title Pt will decrease quick DASH score by at least 8% in order to demonstrate clinically significant reduction in disability.    Baseline 06/11/19 56.8%; 08/04/19 34%    Time 8    Period Weeks    Status Achieved      PT LONG TERM GOAL #2   Title Patient will demonstrate full cervical ROM in order to drive safely    Baseline ; 11/17/19 R/L rotation 35/25 Sidebending 33/31 ext -6d    Time 8    Period Weeks    Status On-going      PT LONG TERM GOAL #3   Title Patient will demonstrate full R shoulder flex A/PROM in order to complete overhead ADLs    Baseline 11/17/19 AROM flex 120d PROM flex 145; PROM/AROM of ER and IR WNL    Status Revised      PT LONG TERM GOAL #4   Title Patient will demonstrate 4+/5 gross periscapular and shoulder strength in order to complete heavy household ADLs and overhead ADLs    Baseline 11/17/19 R/L shoulder flex 3+/4+ abd 4-/5  ER 3+/4+  IR 4/5 T 3+/4+ Y 2+/3+    Time 8    Period Weeks    Status New                 Plan - 08/15/20 1131    Clinical Impression Statement PT continued progression for overhead mobility, continuing lawn chair progression with success. PT continued manual techniques to maintain availible ROM with success. PT will continue progression as able.    Personal Factors and Comorbidities Age;Behavior Pattern;Comorbidity 1;Comorbidity 2;Past/Current Experience;Fitness;Sex;Time since onset of injury/illness/exacerbation    Comorbidities scleroderma, cancer    Examination-Activity Limitations Reach Overhead;Self Feeding;Dressing;Sleep;Lift    Examination-Participation Restrictions Laundry;Cleaning;Community Activity;Driving    Stability/Clinical Decision Making Evolving/Moderate complexity    Clinical Decision Making Moderate    Rehab Potential Good    PT Frequency 2x / week    PT Duration 8 weeks    PT Treatment/Interventions ADLs/Self Care Home  Management;Aquatic Therapy;Moist Heat;Traction;Ultrasound;Cryotherapy;Parrafin;Functional mobility training;Neuromuscular re-education;Taping;Dry needling;Passive range of motion;Joint Manipulations;Spinal Manipulations;Patient/family education;Therapeutic exercise;Manual techniques;Therapeutic activities;Electrical Stimulation;Fluidtherapy;Iontophoresis 4mg /ml Dexamethasone    PT Next Visit Plan Increase cervical ROM; postural restoration    Consulted and Agree with Plan of Care Patient           Patient will benefit from skilled therapeutic intervention in order to improve the following deficits and impairments:  Decreased endurance, Decreased mobility, Pain, Postural dysfunction, Impaired UE functional use, Impaired flexibility, Increased fascial restricitons, Decreased strength, Decreased activity tolerance, Decreased range of motion, Decreased  scar mobility, Impaired tone, Improper body mechanics, Hypomobility  Visit Diagnosis: Muscle weakness (generalized)  Stiffness of right shoulder, not elsewhere classified  Cervicalgia     Problem List There are no problems to display for this patient.  Durwin Reges DPT Durwin Reges 08/15/2020, 11:57 AM  [facility] [address] [place], [address] Phone: [phone]   Fax:  [phone]  Name: Whitney Mclean MRN: [id number] Date of Birth: [birth year]

## 2020-08-16 ENCOUNTER — Encounter: Payer: BC Managed Care – PPO | Admitting: Physical Therapy

## 2020-08-30 ENCOUNTER — Encounter: Payer: BC Managed Care – PPO | Admitting: Physical Therapy

## 2020-08-30 ENCOUNTER — Encounter: Payer: Self-pay | Admitting: Physical Therapy

## 2020-08-30 ENCOUNTER — Ambulatory Visit: Payer: BC Managed Care – PPO | Attending: Internal Medicine | Admitting: Physical Therapy

## 2020-08-30 ENCOUNTER — Other Ambulatory Visit: Payer: Self-pay

## 2020-08-30 DIAGNOSIS — M6281 Muscle weakness (generalized): Secondary | ICD-10-CM

## 2020-08-30 DIAGNOSIS — M542 Cervicalgia: Secondary | ICD-10-CM | POA: Diagnosis present

## 2020-08-30 DIAGNOSIS — M25611 Stiffness of right shoulder, not elsewhere classified: Secondary | ICD-10-CM

## 2020-08-30 NOTE — Therapy (Signed)
[facility] [address] [place], [address] Phone: [phone]   Fax:  [phone]  Physical Therapy Treatment  Patient Details  Name: Whitney Mclean MRN: [id number] Date of Birth: Aug 27, 1966 No data recorded  Encounter Date: 08/30/2020   PT End of Session - 08/30/20 1026    Visit Number 48    Number of Visits 53    Date for PT Re-Evaluation 09/22/20    Authorization Type [employer] 30appts jan 2021-jan 2022    Authorization - Visit Number 69    Authorization - Number of Visits 30    PT Start Time 0945    PT Stop Time 1030    PT Time Calculation (min) 45 min    Activity Tolerance Patient tolerated treatment well    Behavior During Therapy [facility] for tasks assessed/performed           Past Medical History:  Diagnosis Date  . Oral cancer ([place])   . Scleroderma [facility])     Past Surgical History:  Procedure Laterality Date  . ABDOMINAL HYSTERECTOMY      There were no vitals filed for this visit.   Subjective Assessment - 08/30/20 0955    Subjective Patient reports following last session she got more naseaus, thinks it is because she did not eat enough breakfast. Feeling well today, continuing with HEP.    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 Shoulder pain started 3 months ago, no mechanism of injury, was getting better with heat. Reports she had pain for a about a month, and for the past 2 months the shoulder has been stiff. Has associated neck stiffness since her surgery last year . 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 Has trouble washing her hair, cannot tie her shoes d/t reaching and hand dysfunction, cannot sit up and hold her head up without neck discomfort at her neck, cannot dress herself normally. No pain in  shoulder/neck over the past week, describes discomfort/stiffness. 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    Limitations Sitting;Writing;House hold activities;Lifting    How long can you sit comfortably? 30    How long can you stand comfortably? unlimited    How long can you walk comfortably? unlimited    Diagnostic tests None on shoulder    Patient Stated Goals Increase shoulder strength and mobility    Pain Onset More than a month ago           THEREX -AA/ROM scapular protraction/retraction and thoracic spine rotation on NuStep; L4 72mins, seat7, SPM60s, Arms level9;cuing for cervical ext to neutral able to hold somewhat - Standing Y on wallwith 1# DB 3x 8 with min cuing for available cervical neutral with good carry over - Lat pull down 25#3x 8/7/6Min cuing for availible cervical ext with good carry over -Seatedbilat shoulder flexagainst wall1# DB 3x8with min cuing for cervical ext to touch wall, and scapular touch to wall with good carry over - Same set up as above for shoulder abd 2x 8 1# DB with good carry over of technique, some protraction   Manual Therapy to reduce pain and tissue tension, improve range of motion, neuromodulation, in order to promote improved ability to complete functional activities. -Passive cervical rotation and lateral bending to L ,  and ext off edge of mat table with supportsustained holds(able to get upper cervical spine off mat table)         PT Education - 08/30/20 1001    Education Details therex form/technique    Person(s) Educated Patient    Methods Explanation;Demonstration;Verbal cues    Comprehension Verbalized understanding;Returned demonstration;Verbal cues required            PT Short Term Goals - 09/15/19 1119      PT SHORT TERM GOAL #1   Title Pt will be independent with HEP in order to improve strength and decrease pain in order to improve pain-free function at home and work.    Baseline 08/04/19  Completing HEP regularly withotu question or concern    Time 4    Period Weeks    Status Achieved             PT Long Term Goals - 11/17/19 1108      PT LONG TERM GOAL #1   Title Pt will decrease quick DASH score by at least 8% in order to demonstrate clinically significant reduction in disability.    Baseline 06/11/19 56.8%; 08/04/19 34%    Time 8    Period Weeks    Status Achieved      PT LONG TERM GOAL #2   Title Patient will demonstrate full cervical ROM in order to drive safely    Baseline ; 11/17/19 R/L rotation 35/25 Sidebending 33/31 ext -6d    Time 8    Period Weeks    Status On-going      PT LONG TERM GOAL #3   Title Patient will demonstrate full R shoulder flex A/PROM in order to complete overhead ADLs    Baseline 11/17/19 AROM flex 120d PROM flex 145; PROM/AROM of ER and IR WNL    Status Revised      PT LONG TERM GOAL #4   Title Patient will demonstrate 4+/5 gross periscapular and shoulder strength in order to complete heavy household ADLs and overhead ADLs    Baseline 11/17/19 R/L shoulder flex 3+/4+ abd 4-/5  ER 3+/4+  IR 4/5 T 3+/4+ Y 2+/3+    Time 8    Period Weeks    Status New                 Plan - 08/30/20 1029    Clinical Impression Statement PT continued progression for overhead mobility and increased cervical ROM with success. Patient is continuing to demonstrate slow, steady progress in the lawn chair progression for overhead lifting. Patient is maintaining cervical ext availible well between sessios allowing for slow, steady progression of ext progress. PT will continue progression as able.    Personal Factors and Comorbidities Age;Behavior Pattern;Comorbidity 1;Comorbidity 2;Past/Current Experience;Fitness;Sex;Time since onset of injury/illness/exacerbation    Comorbidities scleroderma, cancer    Examination-Activity Limitations Reach Overhead;Self Feeding;Dressing;Sleep;Lift    Examination-Participation Restrictions Laundry;Cleaning;Community  Activity;Driving    Stability/Clinical Decision Making Evolving/Moderate complexity    Rehab Potential Good    PT Frequency 2x / week    PT Duration 8 weeks    PT Treatment/Interventions ADLs/Self Care Home Management;Aquatic Therapy;Moist Heat;Traction;Ultrasound;Cryotherapy;Parrafin;Functional mobility training;Neuromuscular re-education;Taping;Dry needling;Passive range of motion;Joint Manipulations;Spinal Manipulations;Patient/family education;Therapeutic exercise;Manual techniques;Therapeutic activities;Electrical Stimulation;Fluidtherapy;Iontophoresis 4mg /ml Dexamethasone    PT Next Visit Plan Increase cervical ROM; postural restoration    Consulted and Agree with Plan of Care Patient           Patient will benefit from skilled therapeutic intervention in order to improve  the following deficits and impairments:  Decreased endurance, Decreased mobility, Pain, Postural dysfunction, Impaired UE functional use, Impaired flexibility, Increased fascial restricitons, Decreased strength, Decreased activity tolerance, Decreased range of motion, Decreased scar mobility, Impaired tone, Improper body mechanics, Hypomobility  Visit Diagnosis: Muscle weakness (generalized)  Stiffness of right shoulder, not elsewhere classified     Problem List There are no problems to display for this patient.  Durwin Reges DPT Durwin Reges 08/30/2020, 1:51 PM  [place] [address] [place], [address] Phone: [phone]   Fax:  [phone]  Name: Whitney Mclean MRN: [id number] Date of Birth: 1966/09/19

## 2020-09-06 ENCOUNTER — Ambulatory Visit: Payer: BC Managed Care – PPO | Admitting: Physical Therapy

## 2020-09-07 ENCOUNTER — Encounter: Payer: BC Managed Care – PPO | Admitting: Physical Therapy

## 2020-09-08 ENCOUNTER — Other Ambulatory Visit: Payer: Self-pay | Admitting: Internal Medicine

## 2020-09-08 DIAGNOSIS — Z1231 Encounter for screening mammogram for malignant neoplasm of breast: Secondary | ICD-10-CM

## 2020-09-13 ENCOUNTER — Encounter: Payer: Self-pay | Admitting: Physical Therapy

## 2020-09-13 ENCOUNTER — Other Ambulatory Visit: Payer: Self-pay

## 2020-09-13 ENCOUNTER — Ambulatory Visit: Payer: BC Managed Care – PPO | Admitting: Physical Therapy

## 2020-09-13 DIAGNOSIS — M6281 Muscle weakness (generalized): Secondary | ICD-10-CM | POA: Diagnosis not present

## 2020-09-13 DIAGNOSIS — M542 Cervicalgia: Secondary | ICD-10-CM

## 2020-09-13 DIAGNOSIS — M25611 Stiffness of right shoulder, not elsewhere classified: Secondary | ICD-10-CM

## 2020-09-13 NOTE — Therapy (Signed)
[facility] [address] [place], [address] Phone: [phone]   Fax:  [phone]  Physical Therapy Treatment  Patient Details  Name: Whitney Mclean MRN: [id number] Date of Birth: 01/29/1966 No data recorded  Encounter Date: 09/13/2020   PT End of Session - 09/13/20 1128    Visit Number 49    Number of Visits 53    Date for PT Re-Evaluation 09/22/20    Authorization Type [employer] 30appts jan 2021-jan 2022    Authorization - Visit Number 46    Authorization - Number of Visits 30    PT Start Time 1115    PT Stop Time 1155    PT Time Calculation (min) 40 min    Equipment Utilized During Treatment Gait belt    Activity Tolerance Patient tolerated treatment well    Behavior During Therapy WFL for tasks assessed/performed           Past Medical History:  Diagnosis Date   Oral cancer ([place])    Scleroderma ([place])     Past Surgical History:  Procedure Laterality Date   ABDOMINAL HYSTERECTOMY      There were no vitals filed for this visit.   Subjective Assessment - 09/13/20 1117    Subjective Pt was sick to her stomach this am. Otherwise feeling okay, continuing HEP.    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 Shoulder pain started 3 months ago, no mechanism of injury, was getting better with heat. Reports she had pain for a about a month, and for the past 2 months the shoulder has been stiff. Has associated neck stiffness since her surgery last year . 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 Has trouble washing her hair, cannot tie her shoes d/t reaching and hand dysfunction, cannot sit up and hold her head up without neck discomfort at her neck, cannot dress herself normally. No pain in shoulder/neck over the past week,  describes discomfort/stiffness. 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    Limitations Sitting;Writing;House hold activities;Lifting    How long can you sit comfortably? 30    How long can you stand comfortably? unlimited    How long can you walk comfortably? unlimited    Diagnostic tests None on shoulder    Patient Stated Goals Increase shoulder strength and mobility    Pain Onset More than a month ago             THEREX -AA/ROM scapular protraction/retraction and thoracic spine rotation on NuStep; L1 15mins, seat7, SPM60s, Arms level9;cuing for cervical ext to neutral able to hold somewhat - Standing Y on wallBW 2x 10 with min cuing for available cervical neutral with good carry over - Lat pull down 15# x10; 20# 2x10 Min cuing for availible cervical ext with good carry over -Seatedbilat shoulder flexagainst wall1# DB 3x8with min cuing for cervical ext to touch wall, and scapular touch to wall with good carry over - Same set up as above for shoulder abd 2x 8 1# DB with good carry over of technique, some protraction   Manual Therapy to reduce pain and tissue tension, improve range of motion, neuromodulation, in order to promote improved ability to complete functional activities. -Passive cervical rotation and lateral bending to L ,  and ext off edge of mat table with supportsustained holds(able to get upper cervical spine off mat table)                          PT Education - 09/13/20 1127    Education Details therex form/technique.    Person(s) Educated Patient    Methods Explanation;Demonstration;Verbal cues    Comprehension Verbalized understanding;Returned demonstration            PT Short Term Goals - 09/15/19 1119      PT SHORT TERM GOAL #1   Title Pt will be independent with HEP in order to improve strength and decrease pain in order to improve pain-free function at home and work.    Baseline 08/04/19 Completing HEP  regularly withotu question or concern    Time 4    Period Weeks    Status Achieved             PT Long Term Goals - 11/17/19 1108      PT LONG TERM GOAL #1   Title Pt will decrease quick DASH score by at least 8% in order to demonstrate clinically significant reduction in disability.    Baseline 06/11/19 56.8%; 08/04/19 34%    Time 8    Period Weeks    Status Achieved      PT LONG TERM GOAL #2   Title Patient will demonstrate full cervical ROM in order to drive safely    Baseline ; 11/17/19 R/L rotation 35/25 Sidebending 33/31 ext -6d    Time 8    Period Weeks    Status On-going      PT LONG TERM GOAL #3   Title Patient will demonstrate full R shoulder flex A/PROM in order to complete overhead ADLs    Baseline 11/17/19 AROM flex 120d PROM flex 145; PROM/AROM of ER and IR WNL    Status Revised      PT LONG TERM GOAL #4   Title Patient will demonstrate 4+/5 gross periscapular and shoulder strength in order to complete heavy household ADLs and overhead ADLs    Baseline 11/17/19 R/L shoulder flex 3+/4+ abd 4-/5  ER 3+/4+  IR 4/5 T 3+/4+ Y 2+/3+    Time 8    Period Weeks    Status New                 Plan - 09/13/20 1141    Clinical Impression Statement PT continued progression for increased mobility with some regression to avoid naseua discomfort this am. Pt is able to complete all therex with good motivation and proper technique following min cuing. PT will continue progression as able.    Personal Factors and Comorbidities Age;Behavior Pattern;Comorbidity 1;Comorbidity 2;Past/Current Experience;Fitness;Sex;Time since onset of injury/illness/exacerbation    Comorbidities scleroderma, cancer    Examination-Activity Limitations Reach Overhead;Self Feeding;Dressing;Sleep;Lift    Examination-Participation Restrictions Laundry;Cleaning;Community Activity;Driving    Stability/Clinical Decision Making Evolving/Moderate complexity    Clinical Decision Making Moderate    Rehab  Potential Good    PT Frequency 2x / week    PT Duration 8 weeks    PT Treatment/Interventions ADLs/Self Care Home Management;Aquatic Therapy;Moist Heat;Traction;Ultrasound;Cryotherapy;Parrafin;Functional mobility training;Neuromuscular re-education;Taping;Dry needling;Passive range of motion;Joint Manipulations;Spinal Manipulations;Patient/family education;Therapeutic exercise;Manual techniques;Therapeutic activities;Electrical Stimulation;Fluidtherapy;Iontophoresis 4mg /ml Dexamethasone    PT Next Visit Plan Increase cervical ROM; postural restoration    Consulted and Agree with Plan of Care Patient           Patient will benefit  from skilled therapeutic intervention in order to improve the following deficits and impairments:  Decreased endurance,Decreased mobility,Pain,Postural dysfunction,Impaired UE functional use,Impaired flexibility,Increased fascial restricitons,Decreased strength,Decreased activity tolerance,Decreased range of motion,Decreased scar mobility,Impaired tone,Improper body mechanics,Hypomobility  Visit Diagnosis: Muscle weakness (generalized)  Stiffness of right shoulder, not elsewhere classified  Cervicalgia     Problem List There are no problems to display for this patient.  Durwin Reges DPT Durwin Reges 09/13/2020, 1:10 PM  [facility] [address], [place], [address] Phone: [phone]   Fax:  [phone]  Name: Whitney Mclean MRN: [id number] Date of Birth: Dec 23, 1965

## 2020-09-19 ENCOUNTER — Other Ambulatory Visit: Payer: Self-pay

## 2020-09-19 ENCOUNTER — Ambulatory Visit: Payer: BC Managed Care – PPO | Admitting: Physical Therapy

## 2020-09-19 ENCOUNTER — Encounter: Payer: Self-pay | Admitting: Physical Therapy

## 2020-09-19 DIAGNOSIS — M25611 Stiffness of right shoulder, not elsewhere classified: Secondary | ICD-10-CM

## 2020-09-19 DIAGNOSIS — M6281 Muscle weakness (generalized): Secondary | ICD-10-CM | POA: Diagnosis not present

## 2020-09-19 DIAGNOSIS — M542 Cervicalgia: Secondary | ICD-10-CM

## 2020-09-19 NOTE — Therapy (Signed)
[facility] [address], [place], [address] Phone: [phone]   Fax:  [phone]  Physical Therapy Treatment  Patient Details  Name: Whitney Mclean MRN: [id number] Date of Birth: 1966-03-24 No data recorded  Encounter Date: 09/19/2020   PT End of Session - 09/19/20 1136    Visit Number 50    Number of Visits 53    Date for PT Re-Evaluation 09/22/20    Authorization Type [employer] 30appts jan 2021-jan 2022    Authorization - Visit Number 29    Authorization - Number of Visits 30    PT Start Time 1115    PT Stop Time 1155    PT Time Calculation (min) 40 min    Equipment Utilized During Treatment Gait belt    Activity Tolerance Patient tolerated treatment well    Behavior During Therapy WFL for tasks assessed/performed           Past Medical History:  Diagnosis Date   Oral cancer ([facility])    Scleroderma ([facility])     Past Surgical History:  Procedure Laterality Date   ABDOMINAL HYSTERECTOMY      There were no vitals filed for this visit.   Subjective Assessment - 09/19/20 1119    Subjective Reports she saw her dad for the first time in a while and he was in awe of her mobility which she is happy with. Compliance with HEP.    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 Shoulder pain started 3 months ago, no mechanism of injury, was getting better with heat. Reports she had pain for a about a month, and for the past 2 months the shoulder has been stiff. Has associated neck stiffness since her surgery last year . 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 Has trouble washing her hair, cannot tie her shoes d/t reaching and hand dysfunction, cannot sit up and hold her head up without neck discomfort at her neck, cannot dress  herself normally. No pain in shoulder/neck over the past week, describes discomfort/stiffness. 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    Limitations Sitting;Writing;House hold activities;Lifting    How long can you sit comfortably? 30    How long can you stand comfortably? unlimited    How long can you walk comfortably? unlimited    Diagnostic tests None on shoulder    Patient Stated Goals Increase shoulder strength and mobility    Pain Onset More than a month ago              THEREX -AA/ROM scapular protraction/retraction and thoracic spine rotation on NuStep; L2 , seat7, SPM60s, Arms level9;cuing for cervical ext to neutral able to hold somewhat - Standing Y on wallBW x12 with min cuing for available cervical neutral with good carry over - Lat pull down 20# 3x 10/8/8Min cuing for availible cervical ext with good carry over -Seatedbilat shoulder flexagainst wall1# DB 3x8with min cuing for cervical ext to touch wall, and scapular touch to wall with good carry over - Same set up as above for shoulder abd 2x 8 1# DB with good carry over of technique, some protraction   Manual Therapy to reduce pain and tissue tension, improve range of motion, neuromodulation, in order to promote improved  ability to complete functional activities. -Passive cervical rotation and lateral bending to L , and ext off edge of mat table with supportsustained holds(able to get upper cervical spine off mat table)       PT Education - 09/19/20 1135    Education Details therex form/technique    Person(s) Educated Patient    Methods Explanation;Demonstration;Verbal cues    Comprehension Verbalized understanding;Returned demonstration;Verbal cues required            PT Short Term Goals - 09/15/19 1119      PT SHORT TERM GOAL #1   Title Pt will be independent with HEP in order to improve strength and decrease pain in order to improve pain-free function at home and work.     Baseline 08/04/19 Completing HEP regularly withotu question or concern    Time 4    Period Weeks    Status Achieved             PT Long Term Goals - 09/19/20 1138      PT LONG TERM GOAL #1   Title Pt will decrease quick DASH score by at least 8% in order to demonstrate clinically significant reduction in disability.    Baseline 06/11/19 56.8%; 08/04/19 34%    Time 8    Period Weeks    Status Achieved      PT LONG TERM GOAL #2   Title Patient will demonstrate full cervical ROM in order to drive safely    Baseline ; 11/17/19 R/L rotation 35/25 Sidebending 33/31 ext -6d; ; 09/19/20 R/L rotation 35/30 Sidebending 38/46 ext 10d    Time 8    Period Weeks    Status On-going      PT LONG TERM GOAL #3   Title Patient will demonstrate full R shoulder flex A/PROM in order to complete overhead ADLs    Baseline 11/17/19 AROM flex 120d PROM flex 145; PROM/AROM of ER and IR WNL; 09/19/20 AROM flex 140d PROM flex 167    Time 8    Period Weeks    Status On-going      PT LONG TERM GOAL #4   Title Patient will demonstrate 4+/5 gross periscapular and shoulder strength in order to complete heavy household ADLs and overhead ADLs    Baseline 11/17/19 R/L shoulder flex 3+/4+ abd 4-/5  ER 3+/4+  IR 4/5 T 3+/4+ Y 2+/3+; 09/19/20 R/L shoulder flex 4-/4+ abd 4/5  ER 4-/4+  IR 4+/5 T 4-/4+ Y 3+/4-    Time 8    Period Weeks    Status On-going                 Plan - 09/19/20 1227    Clinical Impression Statement PT continued therex progression for increased strength and maintenance of availible ROM with success. PT reassessed goals per insurance compliance where pt shows steady progress of strength and mobility following every-other week frequency in conjunction with robust HEP. Patient has shown no signs of regression over the past 6 months on this frequency, and has demonstrated continual improvements in ROM and strength, allowing for subjectively increased function at home completing ADLs and  quality of life. Pt will benefit from continued PT at this frequency to prevent decline and continue improvements toward ind function. Patient is able to comply with all cuing for proper technique of therex with good motivation throughout session and understanding of HEP. PT will continue progression as able.    Personal Factors and Comorbidities Age;Behavior Pattern;Comorbidity 1;Comorbidity 2;Past/Current Experience;Fitness;Sex;Time  since onset of injury/illness/exacerbation    Comorbidities scleroderma, cancer    Examination-Activity Limitations Reach Overhead;Self Feeding;Dressing;Sleep;Lift    Examination-Participation Restrictions Laundry;Cleaning;Community Activity;Driving    Stability/Clinical Decision Making Evolving/Moderate complexity    Clinical Decision Making Moderate    Rehab Potential Good    PT Frequency 2x / week    PT Duration 8 weeks    PT Treatment/Interventions ADLs/Self Care Home Management;Aquatic Therapy;Moist Heat;Traction;Ultrasound;Cryotherapy;Parrafin;Functional mobility training;Neuromuscular re-education;Taping;Dry needling;Passive range of motion;Joint Manipulations;Spinal Manipulations;Patient/family education;Therapeutic exercise;Manual techniques;Therapeutic activities;Electrical Stimulation;Fluidtherapy;Iontophoresis 4mg /ml Dexamethasone    PT Next Visit Plan Increase cervical ROM; postural restoration    Consulted and Agree with Plan of Care Patient           Patient will benefit from skilled therapeutic intervention in order to improve the following deficits and impairments:  Decreased endurance,Decreased mobility,Pain,Postural dysfunction,Impaired UE functional use,Impaired flexibility,Increased fascial restricitons,Decreased strength,Decreased activity tolerance,Decreased range of motion,Decreased scar mobility,Impaired tone,Improper body mechanics,Hypomobility  Visit Diagnosis: Muscle weakness (generalized)  Stiffness of right shoulder, not elsewhere  classified  Cervicalgia     Problem List There are no problems to display for this patient.  Durwin Reges DPT Durwin Reges 09/19/2020, 12:51 PM  [facility] [address], [place], [address] Phone: [phone]   Fax:  [phone]  Name: Whitney Mclean MRN: [id number] Date of Birth: August 14, 1966

## 2020-10-03 ENCOUNTER — Ambulatory Visit: Payer: BC Managed Care – PPO | Attending: Internal Medicine | Admitting: Physical Therapy

## 2020-10-03 ENCOUNTER — Encounter: Payer: Self-pay | Admitting: Physical Therapy

## 2020-10-03 ENCOUNTER — Other Ambulatory Visit: Payer: Self-pay

## 2020-10-03 DIAGNOSIS — M542 Cervicalgia: Secondary | ICD-10-CM | POA: Diagnosis present

## 2020-10-03 DIAGNOSIS — M25611 Stiffness of right shoulder, not elsewhere classified: Secondary | ICD-10-CM | POA: Insufficient documentation

## 2020-10-03 DIAGNOSIS — M6281 Muscle weakness (generalized): Secondary | ICD-10-CM | POA: Diagnosis present

## 2020-10-03 NOTE — Therapy (Signed)
[facility] [address], [place], [address] Phone: [phone]   Fax:  [phone]  Physical Therapy Treatment  Patient Details  Name: Whitney Mclean MRN: [id number] Date of Birth: [birth year] No data recorded  Encounter Date: 10/03/2020   PT End of Session - 10/03/20 1132    Visit Number 51    Number of Visits 69    Date for PT Re-Evaluation 03/22/21    Authorization Type [employer] 30appts jan 2022-jan 2023    Authorization - Visit Number 1    Authorization - Number of Visits 30    PT Start Time 1115    PT Stop Time 1156    PT Time Calculation (min) 41 min    Activity Tolerance Patient tolerated treatment well    Behavior During Therapy [facility] for tasks assessed/performed           Past Medical History:  Diagnosis Date  . Oral cancer ([place])   . Scleroderma [facility])     Past Surgical History:  Procedure Laterality Date  . ABDOMINAL HYSTERECTOMY      There were no vitals filed for this visit.     THEREX -AA/ROM scapular protraction/retraction and thoracic spine rotation on NuStep; L2 65mins, seat7, SPM60s, Arms level9;cuing for cervical ext to neutral able to hold somewhat - Standing Y on wallBW 3x 8 with min cuing for posture with good carry over - Lat pull down20# 3x 10/8/8Min cuing for availible cervical ext with good carry over -Seatedbilat shoulder flexagainst wall1# DB 2x8with min cuing for posture, difficulty with final reps - Same set up as above for shoulder abd 2x 8 1# DB with good carry over of technique, some protraction   Manual Therapy to reduce pain and tissue tension, improve range of motion, neuromodulation, in order to promote improved ability to complete functional activities. -Passive cervical rotation and lateral bending to L , and ext off edge of mat table with supportsustained holds(able to get upper cervical spine off mat  table)                         PT Education - 10/03/20 1127    Education Details therex form/technique    Person(s) Educated Patient    Methods Explanation;Demonstration;Verbal cues    Comprehension Verbalized understanding;Returned demonstration;Verbal cues required            PT Short Term Goals - 09/15/19 1119      PT SHORT TERM GOAL #1   Title Pt will be independent with HEP in order to improve strength and decrease pain in order to improve pain-free function at home and work.    Baseline 08/04/19 Completing HEP regularly withotu question or concern    Time 4    Period Weeks    Status Achieved             PT Long Term Goals - 09/19/20 1138      PT LONG TERM GOAL #1   Title Pt will decrease quick DASH score by at least 8% in order to demonstrate clinically significant reduction in disability.    Baseline 06/11/19 56.8%; 08/04/19 34%    Time 8    Period Weeks    Status Achieved      PT LONG TERM GOAL #2   Title Patient will demonstrate full cervical ROM in order to drive safely    Baseline ; 11/17/19 R/L rotation 35/25 Sidebending 33/31 ext -6d; ;  09/19/20 R/L rotation 35/30 Sidebending 38/46 ext 10d    Time 8    Period Weeks    Status On-going      PT LONG TERM GOAL #3   Title Patient will demonstrate full R shoulder flex A/PROM in order to complete overhead ADLs    Baseline 11/17/19 AROM flex 120d PROM flex 145; PROM/AROM of ER and IR WNL; 09/19/20 AROM flex 140d PROM flex 167    Time 8    Period Weeks    Status On-going      PT LONG TERM GOAL #4   Title Patient will demonstrate 4+/5 gross periscapular and shoulder strength in order to complete heavy household ADLs and overhead ADLs    Baseline 11/17/19 R/L shoulder flex 3+/4+ abd 4-/5  ER 3+/4+  IR 4/5 T 3+/4+ Y 2+/3+; 09/19/20 R/L shoulder flex 4-/4+ abd 4/5  ER 4-/4+  IR 4+/5 T 4-/4+ Y 3+/4-    Time 8    Period Weeks    Status On-going                 Plan - 10/03/20 1142     Clinical Impression Statement PT continued progression for increased strength and mobility with succes. Patient is motivated throughout session with good carry over of all cuing. Pt with no increased pain throughout session. PT will continue progression as able.    Personal Factors and Comorbidities Age;Behavior Pattern;Comorbidity 1;Comorbidity 2;Past/Current Experience;Fitness;Sex;Time since onset of injury/illness/exacerbation    Comorbidities scleroderma, cancer    Examination-Activity Limitations Reach Overhead;Self Feeding;Dressing;Sleep;Lift    Examination-Participation Restrictions Laundry;Cleaning;Community Activity;Driving    Stability/Clinical Decision Making Evolving/Moderate complexity    Clinical Decision Making Moderate    Rehab Potential Good    PT Frequency 2x / week    PT Duration 8 weeks    PT Treatment/Interventions ADLs/Self Care Home Management;Aquatic Therapy;Moist Heat;Traction;Ultrasound;Cryotherapy;Parrafin;Functional mobility training;Neuromuscular re-education;Taping;Dry needling;Passive range of motion;Joint Manipulations;Spinal Manipulations;Patient/family education;Therapeutic exercise;Manual techniques;Therapeutic activities;Electrical Stimulation;Fluidtherapy;Iontophoresis 4mg /ml Dexamethasone    PT Next Visit Plan Increase cervical ROM; postural restoration    Consulted and Agree with Plan of Care Patient           Patient will benefit from skilled therapeutic intervention in order to improve the following deficits and impairments:  Decreased endurance,Decreased mobility,Pain,Postural dysfunction,Impaired UE functional use,Impaired flexibility,Increased fascial restricitons,Decreased strength,Decreased activity tolerance,Decreased range of motion,Decreased scar mobility,Impaired tone,Improper body mechanics,Hypomobility  Visit Diagnosis: Muscle weakness (generalized)  Stiffness of right shoulder, not elsewhere classified  Cervicalgia     Problem  List There are no problems to display for this patient.  Durwin Reges DPT Durwin Reges 10/03/2020, 12:52 PM  [place] [facility] [address] [place], [address] Phone: [phone]   Fax:  [phone]  Name: TALASIA SAULTER MRN: [id number] Date of Birth: 10-14-1965

## 2020-10-17 ENCOUNTER — Ambulatory Visit: Payer: BC Managed Care – PPO | Admitting: Physical Therapy

## 2020-10-19 ENCOUNTER — Ambulatory Visit
Admission: RE | Admit: 2020-10-19 | Discharge: 2020-10-19 | Disposition: A | Discharge status: Home / Self Care | Payer: BC Managed Care – PPO | Source: Ambulatory Visit | Attending: Internal Medicine | Admitting: Internal Medicine

## 2020-10-19 ENCOUNTER — Other Ambulatory Visit: Payer: Self-pay

## 2020-10-19 DIAGNOSIS — Z1231 Encounter for screening mammogram for malignant neoplasm of breast: Secondary | ICD-10-CM | POA: Insufficient documentation

## 2020-10-31 ENCOUNTER — Encounter: Payer: Self-pay | Admitting: Physical Therapy

## 2020-10-31 ENCOUNTER — Ambulatory Visit: Payer: BC Managed Care – PPO | Attending: Internal Medicine | Admitting: Physical Therapy

## 2020-10-31 ENCOUNTER — Other Ambulatory Visit: Payer: Self-pay

## 2020-10-31 DIAGNOSIS — M542 Cervicalgia: Secondary | ICD-10-CM | POA: Insufficient documentation

## 2020-10-31 DIAGNOSIS — M6281 Muscle weakness (generalized): Secondary | ICD-10-CM | POA: Insufficient documentation

## 2020-10-31 DIAGNOSIS — M25611 Stiffness of right shoulder, not elsewhere classified: Secondary | ICD-10-CM | POA: Insufficient documentation

## 2020-10-31 NOTE — Therapy (Signed)
[facility] [place] [facility] [address], [place], [address] Phone: [phone]   Fax:  [phone]  Physical Therapy Treatment  Patient Details  Name: Whitney Mclean MRN: [id number] Date of Birth: 12-Dec-1965 No data recorded  Encounter Date: 10/31/2020   PT End of Session - 10/31/20 1122    Visit Number 6    Number of Visits 53    Date for PT Re-Evaluation 03/22/21    Authorization Type [employer] 30appts jan 2022-jan 2023    Authorization - Visit Number 2    Authorization - Number of Visits 30    PT Start Time 1114    PT Stop Time 1155    PT Time Calculation (min) 41 min    Activity Tolerance Patient tolerated treatment well    Behavior During Therapy [facility] for tasks assessed/performed           Past Medical History:  Diagnosis Date  . Oral cancer ([place])   . Scleroderma [facility])     Past Surgical History:  Procedure Laterality Date  . ABDOMINAL HYSTERECTOMY      There were no vitals filed for this visit.   Subjective Assessment - 10/31/20 1118    Subjective Patient is doing well overall. Was able to get her mammogram this year and stand in position for it which is an improvement from last year. Is completing HEP.    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 Shoulder pain started 3 months ago, no mechanism of injury, was getting better with heat. Reports she had pain for a about a month, and for the past 2 months the shoulder has been stiff. Has associated neck stiffness since her surgery last year . 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 Has trouble washing her hair, cannot tie her shoes d/t reaching and hand dysfunction, cannot sit up and hold her head up without neck discomfort at her neck, cannot dress herself normally. No pain in  shoulder/neck over the past week, describes discomfort/stiffness. 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    Limitations Sitting;Writing;House hold activities;Lifting    How long can you sit comfortably? 30    How long can you stand comfortably? unlimited    How long can you walk comfortably? unlimited    Diagnostic tests None on shoulder    Patient Stated Goals Increase shoulder strength and mobility    Pain Onset More than a month ago           THEREX -AA/ROM scapular protraction/retraction and thoracic spine rotation on NuStep; L46mins, seat7, SPM60s, Arms level9;cuing for cervical ext to neutral able to hold somewhat - Standing Y on wallBWx12; 1# DB 2x 10 with min cuing for posture with good carry over Following patient reports feeling a little light headed, BP checked 2x 105/83, water given symptoms monitored -Seatedbilat shoulder flexagainst wall1# DB 2x8with min cuing for posture, difficulty with final reps - Same set up as above for shoulder abd 2x 8 1# DB with good carry over of technique, some protraction   Manual Therapy to reduce pain and tissue tension, improve range of motion, neuromodulation, in order to promote improved ability to complete functional activities. -Passive cervical rotation and lateral bending to L , and ext off edge of mat  table with supportsustained holds(able to get upper cervical spine off mat table)             PT Education - 10/31/20 1120    Education Details therex form/technique    Person(s) Educated Patient    Methods Explanation;Demonstration;Tactile cues;Verbal cues    Comprehension Verbalized understanding;Returned demonstration;Verbal cues required;Tactile cues required            PT Short Term Goals - 09/15/19 1119      PT SHORT TERM GOAL #1   Title Pt will be independent with HEP in order to improve strength and decrease pain in order to improve pain-free function at home and work.     Baseline 08/04/19 Completing HEP regularly withotu question or concern    Time 4    Period Weeks    Status Achieved             PT Long Term Goals - 09/19/20 1138      PT LONG TERM GOAL #1   Title Pt will decrease quick DASH score by at least 8% in order to demonstrate clinically significant reduction in disability.    Baseline 06/11/19 56.8%; 08/04/19 34%    Time 8    Period Weeks    Status Achieved      PT LONG TERM GOAL #2   Title Patient will demonstrate full cervical ROM in order to drive safely    Baseline ; 11/17/19 R/L rotation 35/25 Sidebending 33/31 ext -6d; ; 09/19/20 R/L rotation 35/30 Sidebending 38/46 ext 10d    Time 8    Period Weeks    Status On-going      PT LONG TERM GOAL #3   Title Patient will demonstrate full R shoulder flex A/PROM in order to complete overhead ADLs    Baseline 11/17/19 AROM flex 120d PROM flex 145; PROM/AROM of ER and IR WNL; 09/19/20 AROM flex 140d PROM flex 167    Time 8    Period Weeks    Status On-going      PT LONG TERM GOAL #4   Title Patient will demonstrate 4+/5 gross periscapular and shoulder strength in order to complete heavy household ADLs and overhead ADLs    Baseline 11/17/19 R/L shoulder flex 3+/4+ abd 4-/5  ER 3+/4+  IR 4/5 T 3+/4+ Y 2+/3+; 09/19/20 R/L shoulder flex 4-/4+ abd 4/5  ER 4-/4+  IR 4+/5 T 4-/4+ Y 3+/4-    Time 8    Period Weeks    Status On-going                 Plan - 10/31/20 1156    Clinical Impression Statement PT continued progression for increased mobility and strength with success. Vital monitoring needed throughout session d/t symptoms of light-headness, systolic BP low, but within exercise limits (159mmHg). Symptoms improved with water and rest, exercises adjusted accordingly. Pt is able to comply with all cuing for proper technique of therex with good motivation throughout session. PT will continue progression as able.    Personal Factors and Comorbidities Age;Behavior Pattern;Comorbidity  1;Comorbidity 2;Past/Current Experience;Fitness;Sex;Time since onset of injury/illness/exacerbation    Comorbidities scleroderma, cancer    Examination-Activity Limitations Reach Overhead;Self Feeding;Dressing;Sleep;Lift    Examination-Participation Restrictions Laundry;Cleaning;Community Activity;Driving    Stability/Clinical Decision Making Evolving/Moderate complexity    Clinical Decision Making Moderate    Rehab Potential Good    PT Frequency 2x / week    PT Duration 8 weeks    PT Treatment/Interventions ADLs/Self Care Home Management;Aquatic Therapy;Moist Heat;Traction;Ultrasound;Cryotherapy;Parrafin;Functional  mobility training;Neuromuscular re-education;Taping;Dry needling;Passive range of motion;Joint Manipulations;Spinal Manipulations;Patient/family education;Therapeutic exercise;Manual techniques;Therapeutic activities;Electrical Stimulation;Fluidtherapy;Iontophoresis 4mg /ml Dexamethasone    PT Next Visit Plan Increase cervical ROM; postural restoration    Consulted and Agree with Plan of Care Patient           Patient will benefit from skilled therapeutic intervention in order to improve the following deficits and impairments:  Decreased endurance,Decreased mobility,Pain,Postural dysfunction,Impaired UE functional use,Impaired flexibility,Increased fascial restricitons,Decreased strength,Decreased activity tolerance,Decreased range of motion,Decreased scar mobility,Impaired tone,Improper body mechanics,Hypomobility  Visit Diagnosis: Muscle weakness (generalized)  Stiffness of right shoulder, not elsewhere classified  Cervicalgia     Problem List There are no problems to display for this patient.  Durwin Reges DPT Durwin Reges 10/31/2020, 11:58 AM  [place] [facility] [address], [place], [address] Phone: [phone]   Fax:  [phone]  Name: Whitney Mclean MRN: [id number] Date of Birth:  1966-09-19

## 2020-11-15 ENCOUNTER — Encounter: Payer: Self-pay | Admitting: Physical Therapy

## 2020-11-15 ENCOUNTER — Other Ambulatory Visit: Payer: Self-pay

## 2020-11-15 ENCOUNTER — Ambulatory Visit: Payer: BC Managed Care – PPO | Admitting: Physical Therapy

## 2020-11-15 DIAGNOSIS — M25611 Stiffness of right shoulder, not elsewhere classified: Secondary | ICD-10-CM

## 2020-11-15 DIAGNOSIS — M6281 Muscle weakness (generalized): Secondary | ICD-10-CM | POA: Diagnosis not present

## 2020-11-15 DIAGNOSIS — M542 Cervicalgia: Secondary | ICD-10-CM

## 2020-11-15 NOTE — Therapy (Signed)
[place] [facility] [address], [place], [address] Phone: [phone]   Fax:  [phone]  Physical Therapy Treatment  Patient Details  Name: Whitney Mclean MRN: [id number] Date of Birth: 01/10/1966 No data recorded  Encounter Date: 11/15/2020   PT End of Session - 11/15/20 1136    Visit Number 38    Number of Visits 70    Date for PT Re-Evaluation 03/22/21    Authorization Type [employer] 30appts jan 2022-jan 2023    Authorization - Visit Number 3    Authorization - Number of Visits 30    PT Start Time 1119    PT Stop Time 1157    PT Time Calculation (min) 38 min    Activity Tolerance Patient tolerated treatment well    Behavior During Therapy [facility] for tasks assessed/performed           Past Medical History:  Diagnosis Date  . Oral cancer ([place])   . Scleroderma [facility])     Past Surgical History:  Procedure Laterality Date  . ABDOMINAL HYSTERECTOMY      There were no vitals filed for this visit.   Subjective Assessment - 11/15/20 1126    Subjective Doing well overall. Is completing HEP. 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 Shoulder pain started 3 months ago, no mechanism of injury, was getting better with heat. Reports she had pain for a about a month, and for the past 2 months the shoulder has been stiff. Has associated neck stiffness since her surgery last year . 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 Has trouble washing her hair, cannot tie her shoes d/t reaching and hand dysfunction, cannot sit up and hold her head up without neck discomfort at her neck, cannot dress herself normally. No pain in shoulder/neck over the past week, describes discomfort/stiffness. Is seeing OT bilat  hands d/t scleraderma stiffness.    Limitations Sitting;Writing;House hold activities;Lifting    How long can you sit comfortably? 30    How long can you stand comfortably? unlimited    How long can you walk comfortably? unlimited    Diagnostic tests None on shoulder    Patient Stated Goals Increase shoulder strength and mobility    Pain Onset More than a month ago             THEREX -AA/ROM scapular protraction/retraction and thoracic spine rotation on NuStep; L82mins, seat7, SPM60s, Arms level9;cuing for cervical ext to neutral able to hold somewhat - Lat pulldown 20# 3x10 with min cuing for eccentric control with good carry over - Standing Y on wall1# DB 3x 10 with min cuing forposturewith good carry over -Seatedbilat shoulder flexagainst wall1# DB2x10/8with min cuing forposture, difficulty with final reps - Same set up Hank[place] press 1# 2x 8 with min cuing for set up and eccentric control   Manual Therapy to reduce pain and tissue tension, improve range of motion, neuromodulation, in order to promote improved ability to complete functional activities. -Passive cervical rotation and lateral bending to L , and ext off edge of mat table with supportsustained holds(able to get upper cervical spine off mat table)  PT Education - 11/15/20 1130    Education Details therex form/technique    Person(s) Educated Patient    Methods Explanation;Demonstration;Verbal cues    Comprehension Verbalized understanding;Returned demonstration;Verbal cues required            PT Short Term Goals - 09/15/19 1119      PT SHORT TERM GOAL #1   Title Pt will be independent with HEP in order to improve strength and decrease pain in order to improve pain-free function at home and work.    Baseline 08/04/19 Completing HEP regularly withotu question or concern    Time 4    Period Weeks    Status Achieved             PT Long Term Goals - 09/19/20 1138       PT LONG TERM GOAL #1   Title Pt will decrease quick DASH score by at least 8% in order to demonstrate clinically significant reduction in disability.    Baseline 06/11/19 56.8%; 08/04/19 34%    Time 8    Period Weeks    Status Achieved      PT LONG TERM GOAL #2   Title Patient will demonstrate full cervical ROM in order to drive safely    Baseline ; 11/17/19 R/L rotation 35/25 Sidebending 33/31 ext -6d; ; 09/19/20 R/L rotation 35/30 Sidebending 38/46 ext 10d    Time 8    Period Weeks    Status On-going      PT LONG TERM GOAL #3   Title Patient will demonstrate full R shoulder flex A/PROM in order to complete overhead ADLs    Baseline 11/17/19 AROM flex 120d PROM flex 145; PROM/AROM of ER and IR WNL; 09/19/20 AROM flex 140d PROM flex 167    Time 8    Period Weeks    Status On-going      PT LONG TERM GOAL #4   Title Patient will demonstrate 4+/5 gross periscapular and shoulder strength in order to complete heavy household ADLs and overhead ADLs    Baseline 11/17/19 R/L shoulder flex 3+/4+ abd 4-/5  ER 3+/4+  IR 4/5 T 3+/4+ Y 2+/3+; 09/19/20 R/L shoulder flex 4-/4+ abd 4/5  ER 4-/4+  IR 4+/5 T 4-/4+ Y 3+/4-    Time 8    Period Weeks    Status On-going                 Plan - 11/15/20 1137    Clinical Impression Statement PT continued therex progression for increased mobility and strength with success. Patient is able to tolerate slight therex progression and manual techniques with good tolerance, and good carry over of technique of therex. PT will continue progression as able.    Personal Factors and Comorbidities Age;Behavior Pattern;Comorbidity 1;Comorbidity 2;Past/Current Experience;Fitness;Sex;Time since onset of injury/illness/exacerbation    Comorbidities scleroderma, cancer    Examination-Activity Limitations Reach Overhead;Self Feeding;Dressing;Sleep;Lift    Examination-Participation Restrictions Laundry;Cleaning;Community Activity;Driving    Stability/Clinical  Decision Making Evolving/Moderate complexity    Clinical Decision Making Moderate    Rehab Potential Good    PT Frequency 2x / week    PT Duration 8 weeks    PT Treatment/Interventions ADLs/Self Care Home Management;Aquatic Therapy;Moist Heat;Traction;Ultrasound;Cryotherapy;Parrafin;Functional mobility training;Neuromuscular re-education;Taping;Dry needling;Passive range of motion;Joint Manipulations;Spinal Manipulations;Patient/family education;Therapeutic exercise;Manual techniques;Therapeutic activities;Electrical Stimulation;Fluidtherapy;Iontophoresis 4mg /ml Dexamethasone    PT Next Visit Plan Increase cervical ROM; postural restoration    Consulted and Agree with Plan of Care Patient  Patient will benefit from skilled therapeutic intervention in order to improve the following deficits and impairments:  Decreased endurance,Decreased mobility,Pain,Postural dysfunction,Impaired UE functional use,Impaired flexibility,Increased fascial restricitons,Decreased strength,Decreased activity tolerance,Decreased range of motion,Decreased scar mobility,Impaired tone,Improper body mechanics,Hypomobility  Visit Diagnosis: Muscle weakness (generalized)  Stiffness of right shoulder, not elsewhere classified  Cervicalgia     Problem List There are no problems to display for this patient.  Durwin Reges DPT Durwin Reges 11/15/2020, 2:26 PM  [place] [facility] [address] [place], [address] Phone: [phone]   Fax:  [phone]  Name: MALIYA MARICH MRN: [id number] Date of Birth: 1966/08/15

## 2020-11-29 ENCOUNTER — Ambulatory Visit: Payer: BC Managed Care – PPO | Admitting: Physical Therapy

## 2020-12-01 ENCOUNTER — Ambulatory Visit: Payer: BC Managed Care – PPO | Attending: Internal Medicine | Admitting: Physical Therapy

## 2020-12-01 ENCOUNTER — Other Ambulatory Visit: Payer: Self-pay

## 2020-12-01 ENCOUNTER — Encounter: Payer: Self-pay | Admitting: Physical Therapy

## 2020-12-01 DIAGNOSIS — M6281 Muscle weakness (generalized): Secondary | ICD-10-CM | POA: Insufficient documentation

## 2020-12-01 DIAGNOSIS — M25611 Stiffness of right shoulder, not elsewhere classified: Secondary | ICD-10-CM | POA: Insufficient documentation

## 2020-12-01 DIAGNOSIS — M542 Cervicalgia: Secondary | ICD-10-CM | POA: Insufficient documentation

## 2020-12-01 NOTE — Therapy (Signed)
[place] [facility] [address] [place], [address] Phone: [phone]   Fax:  [phone]  Physical Therapy Treatment  Patient Details  Name: Whitney Mclean MRN: [id number] Date of Birth: 31-Jul-1966 No data recorded  Encounter Date: 12/01/2020   PT End of Session - 12/01/20 1140    Visit Number 80    Number of Visits 70    Date for PT Re-Evaluation 03/22/21    Authorization Type [employer] 30appts jan 2022-jan 2023    Authorization - Visit Number 4    Authorization - Number of Visits 30    PT Start Time 8144    PT Stop Time 1207    PT Time Calculation (min) 39 min    Activity Tolerance Patient tolerated treatment well    Behavior During Therapy [facility] for tasks assessed/performed           Past Medical History:  Diagnosis Date  . Oral cancer ([place])   . Scleroderma [facility])     Past Surgical History:  Procedure Laterality Date  . ABDOMINAL HYSTERECTOMY      There were no vitals filed for this visit.   Subjective Assessment - 12/01/20 1134    Subjective Pt reports she has been very nauseous this week, is going to see NP at the end of this month. Otherwise trying to complete HEP and doing well.    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 Shoulder pain started 3 months ago, no mechanism of injury, was getting better with heat. Reports she had pain for a about a month, and for the past 2 months the shoulder has been stiff. Has associated neck stiffness since her surgery last year . 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 Has trouble washing her hair, cannot tie her shoes d/t reaching and hand dysfunction, cannot sit up and hold her head up without neck discomfort at her neck, cannot dress herself normally. No pain in shoulder/neck over  the past week, describes discomfort/stiffness. 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    Limitations Sitting;Writing;House hold activities;Lifting    How long can you sit comfortably? 30    How long can you stand comfortably? unlimited    How long can you walk comfortably? unlimited    Diagnostic tests None on shoulder    Patient Stated Goals Increase shoulder strength and mobility    Pain Onset More than a month ago           THEREX -AA/ROM scapular protraction/retraction and thoracic spine rotation on NuStep; L68mins, seat7, SPM60s, Arms level9;cuing for cervical ext to neutral able to hold somewhat - Lat pulldown 20# 3x10 with min cuing for eccentric control with good carry over -Seatedbilat shoulder flexagainst wall1# DB2x8with min cuing forposture, difficulty with final reps - Same set up military press 1# 2x 8 with min cuing for set up and eccentric control   Manual Therapy to reduce pain and tissue tension, improve range of motion, neuromodulation, in order to promote improved ability to complete functional activities. -Passive cervical rotation and lateral bending to L , and ext off edge of mat table with supportsustained holds(able to get upper cervical spine off mat table)           PT Education -  12/01/20 1140    Education Details therex form/technique    Person(s) Educated Patient    Methods Explanation;Demonstration;Verbal cues    Comprehension Verbalized understanding;Returned demonstration;Verbal cues required            PT Short Term Goals - 09/15/19 1119      PT SHORT TERM GOAL #1   Title Pt will be independent with HEP in order to improve strength and decrease pain in order to improve pain-free function at home and work.    Baseline 08/04/19 Completing HEP regularly withotu question or concern    Time 4    Period Weeks    Status Achieved             PT Long Term Goals - 09/19/20 1138      PT LONG TERM GOAL #1    Title Pt will decrease quick DASH score by at least 8% in order to demonstrate clinically significant reduction in disability.    Baseline 06/11/19 56.8%; 08/04/19 34%    Time 8    Period Weeks    Status Achieved      PT LONG TERM GOAL #2   Title Patient will demonstrate full cervical ROM in order to drive safely    Baseline ; 11/17/19 R/L rotation 35/25 Sidebending 33/31 ext -6d; ; 09/19/20 R/L rotation 35/30 Sidebending 38/46 ext 10d    Time 8    Period Weeks    Status On-going      PT LONG TERM GOAL #3   Title Patient will demonstrate full R shoulder flex A/PROM in order to complete overhead ADLs    Baseline 11/17/19 AROM flex 120d PROM flex 145; PROM/AROM of ER and IR WNL; 09/19/20 AROM flex 140d PROM flex 167    Time 8    Period Weeks    Status On-going      PT LONG TERM GOAL #4   Title Patient will demonstrate 4+/5 gross periscapular and shoulder strength in order to complete heavy household ADLs and overhead ADLs    Baseline 11/17/19 R/L shoulder flex 3+/4+ abd 4-/5  ER 3+/4+  IR 4/5 T 3+/4+ Y 2+/3+; 09/19/20 R/L shoulder flex 4-/4+ abd 4/5  ER 4-/4+  IR 4+/5 T 4-/4+ Y 3+/4-    Time 8    Period Weeks    Status On-going                 Plan - 12/01/20 1153    Clinical Impression Statement PT continued therex progression for increased mobility and strength with success. PT confined all therex to sitting position with increased rest break to prevent making nausea worse. Patient is able to complete all therex with proper technique following minimal cuing with good motivation throughout session. PT will continue progression as able.    Personal Factors and Comorbidities Age;Behavior Pattern;Comorbidity 1;Comorbidity 2;Past/Current Experience;Fitness;Sex;Time since onset of injury/illness/exacerbation    Comorbidities scleroderma, cancer    Examination-Activity Limitations Reach Overhead;Self Feeding;Dressing;Sleep;Lift    Examination-Participation Restrictions  Laundry;Cleaning;Community Activity;Driving    Stability/Clinical Decision Making Evolving/Moderate complexity    Clinical Decision Making Moderate    Rehab Potential Good    PT Frequency 2x / week    PT Duration 8 weeks    PT Treatment/Interventions ADLs/Self Care Home Management;Aquatic Therapy;Moist Heat;Traction;Ultrasound;Cryotherapy;Parrafin;Functional mobility training;Neuromuscular re-education;Taping;Dry needling;Passive range of motion;Joint Manipulations;Spinal Manipulations;Patient/family education;Therapeutic exercise;Manual techniques;Therapeutic activities;Electrical Stimulation;Fluidtherapy;Iontophoresis 4mg /ml Dexamethasone    PT Next Visit Plan Increase cervical ROM; postural restoration    Consulted and Agree with Plan of Care Patient  Patient will benefit from skilled therapeutic intervention in order to improve the following deficits and impairments:  Decreased endurance,Decreased mobility,Pain,Postural dysfunction,Impaired UE functional use,Impaired flexibility,Increased fascial restricitons,Decreased strength,Decreased activity tolerance,Decreased range of motion,Decreased scar mobility,Impaired tone,Improper body mechanics,Hypomobility + Visit Diagnosis: Muscle weakness (generalized)  Stiffness of right shoulder, not elsewhere classified  Cervicalgia     Problem List There are no problems to display for this patient.  Durwin Reges DPT Durwin Reges 12/01/2020, 12:06 PM  [place] [facility] [address], [place], [address] Phone: [phone]   Fax:  [phone]  Name: SHAKEMA SURITA MRN: [id number] Date of Birth: 07/09/1966

## 2020-12-13 ENCOUNTER — Other Ambulatory Visit: Payer: Self-pay

## 2020-12-13 ENCOUNTER — Ambulatory Visit: Payer: BC Managed Care – PPO | Admitting: Physical Therapy

## 2020-12-13 ENCOUNTER — Encounter: Payer: Self-pay | Admitting: Physical Therapy

## 2020-12-13 DIAGNOSIS — M542 Cervicalgia: Secondary | ICD-10-CM

## 2020-12-13 DIAGNOSIS — M25611 Stiffness of right shoulder, not elsewhere classified: Secondary | ICD-10-CM

## 2020-12-13 DIAGNOSIS — M6281 Muscle weakness (generalized): Secondary | ICD-10-CM

## 2020-12-13 NOTE — Therapy (Signed)
[place] [facility] [address] [place], [address] Phone: [phone]   Fax:  [phone]  Physical Therapy Treatment  Patient Details  Name: Whitney Mclean MRN: [id number] Date of Birth: [birth year] No data recorded  Encounter Date: 12/13/2020   PT End of Session - 12/13/20 1202    Visit Number 55    Number of Visits 54    Date for PT Re-Evaluation 03/22/21    Authorization Type [employer] 30appts jan 2022-jan 2023    Authorization - Visit Number 5    Authorization - Number of Visits 30    PT Start Time 1115    PT Stop Time 1155    PT Time Calculation (min) 40 min    Equipment Utilized During Treatment Gait belt    Activity Tolerance Patient tolerated treatment well    Behavior During Therapy [facility] for tasks assessed/performed           Past Medical History:  Diagnosis Date  . Oral cancer ([place])   . Scleroderma [facility])     Past Surgical History:  Procedure Laterality Date  . ABDOMINAL HYSTERECTOMY      There were no vitals filed for this visit.   Subjective Assessment - 12/13/20 1130    Subjective Pt reports her nausea has been better. Had blood in her spit yesterday, but didn't think much of it. Today had blood in her spit and when she blew her nose. Reports no other symptoms of dizziness, blurred vision, decreased balance etc.    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 Shoulder pain started 3 months ago, no mechanism of injury, was getting better with heat. Reports she had pain for a about a month, and for the past 2 months the shoulder has been stiff. Has associated neck stiffness since her surgery last year . 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 Has trouble washing her hair, cannot tie her shoes d/t reaching and hand  dysfunction, cannot sit up and hold her head up without neck discomfort at her neck, cannot dress herself normally. No pain in shoulder/neck over the past week, describes discomfort/stiffness. 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    Limitations Sitting;Writing;House hold activities;Lifting    How long can you sit comfortably? 30    How long can you stand comfortably? unlimited    How long can you walk comfortably? unlimited    Diagnostic tests None on shoulder    Patient Stated Goals Increase shoulder strength and mobility    Pain Onset More than a month ago             BP on arrival: 102/72; 104/74   THEREX -AA/ROM scapular protraction/retraction and thoracic spine rotation on NuStep; L30mins, seat7, SPM60s, Arms level9;cuing for cervical ext to neutral able to hold somewhat BP following 100/71 - Lat pulldown 15# 3x10 with min cuing for eccentric control with good carry over BP following 88/70 39mins later: 91/72 91mins later 96/68 Exercise ceased  Manual Therapy to reduce pain and tissue tension, improve range of motion, neuromodulation, in order to promote improved ability to complete functional activities. -Passive cervical rotation and lateral bending to L , and ext off edge of mat table with supportsustained holds(able to get upper cervical spine off  mat table) BP Following 112/82              PT Education - 12/13/20 1201    Education Details therex form/technique    Person(s) Educated Patient    Methods Explanation;Demonstration;Verbal cues    Comprehension Verbalized understanding;Returned demonstration;Verbal cues required            PT Short Term Goals - 09/15/19 1119      PT SHORT TERM GOAL #1   Title Pt will be independent with HEP in order to improve strength and decrease pain in order to improve pain-free function at home and work.    Baseline 08/04/19 Completing HEP regularly withotu question or concern    Time 4     Period Weeks    Status Achieved             PT Long Term Goals - 09/19/20 1138      PT LONG TERM GOAL #1   Title Pt will decrease quick DASH score by at least 8% in order to demonstrate clinically significant reduction in disability.    Baseline 06/11/19 56.8%; 08/04/19 34%    Time 8    Period Weeks    Status Achieved      PT LONG TERM GOAL #2   Title Patient will demonstrate full cervical ROM in order to drive safely    Baseline ; 11/17/19 R/L rotation 35/25 Sidebending 33/31 ext -6d; ; 09/19/20 R/L rotation 35/30 Sidebending 38/46 ext 10d    Time 8    Period Weeks    Status On-going      PT LONG TERM GOAL #3   Title Patient will demonstrate full R shoulder flex A/PROM in order to complete overhead ADLs    Baseline 11/17/19 AROM flex 120d PROM flex 145; PROM/AROM of ER and IR WNL; 09/19/20 AROM flex 140d PROM flex 167    Time 8    Period Weeks    Status On-going      PT LONG TERM GOAL #4   Title Patient will demonstrate 4+/5 gross periscapular and shoulder strength in order to complete heavy household ADLs and overhead ADLs    Baseline 11/17/19 R/L shoulder flex 3+/4+ abd 4-/5  ER 3+/4+  IR 4/5 T 3+/4+ Y 2+/3+; 09/19/20 R/L shoulder flex 4-/4+ abd 4/5  ER 4-/4+  IR 4+/5 T 4-/4+ Y 3+/4-    Time 8    Period Weeks    Status On-going                 Plan - 12/13/20 1205    Clinical Impression Statement PT monitored BP throughout session, as it was lower than usual at start of session. Following lat pulldown exercise BP hit critical low of 88/70 but quickly returned to 96/68. Exercise ceased following, manual techniques for 82mins with BP 112/82 following. PT advised follow up with PCP and to continue to monitor symptoms and to lay down if symptoms get worse, stay hydrated, etc. PT will continue progression as able.    Personal Factors and Comorbidities Age;Behavior Pattern;Comorbidity 1;Comorbidity 2;Past/Current Experience;Fitness;Sex;Time since onset of  injury/illness/exacerbation    Comorbidities scleroderma, cancer    Examination-Activity Limitations Reach Overhead;Self Feeding;Dressing;Sleep;Lift    Examination-Participation Restrictions Laundry;Cleaning;Community Activity;Driving    Stability/Clinical Decision Making Evolving/Moderate complexity    Clinical Decision Making Moderate    Rehab Potential Good    PT Frequency 2x / week    PT Duration 8 weeks    PT Treatment/Interventions ADLs/Self Care Home Management;Aquatic Therapy;Moist  Heat;Traction;Ultrasound;Cryotherapy;Parrafin;Functional mobility training;Neuromuscular re-education;Taping;Dry needling;Passive range of motion;Joint Manipulations;Spinal Manipulations;Patient/family education;Therapeutic exercise;Manual techniques;Therapeutic activities;Electrical Stimulation;Fluidtherapy;Iontophoresis 4mg /ml Dexamethasone    PT Next Visit Plan Increase cervical ROM; postural restoration    Consulted and Agree with Plan of Care Patient           Patient will benefit from skilled therapeutic intervention in order to improve the following deficits and impairments:  Decreased endurance,Decreased mobility,Pain,Postural dysfunction,Impaired UE functional use,Impaired flexibility,Increased fascial restricitons,Decreased strength,Decreased activity tolerance,Decreased range of motion,Decreased scar mobility,Impaired tone,Improper body mechanics,Hypomobility  Visit Diagnosis: Muscle weakness (generalized)  Stiffness of right shoulder, not elsewhere classified  Cervicalgia     Problem List There are no problems to display for this patient.  Durwin Reges DPT Durwin Reges 12/13/2020, 12:17 PM  [place] [facility] [address], [place], [address] Phone: [phone]   Fax:  [phone]  Name: CANNA NICKELSON MRN: [id number] Date of Birth: [birth year]

## 2020-12-27 ENCOUNTER — Other Ambulatory Visit: Payer: Self-pay

## 2020-12-27 ENCOUNTER — Ambulatory Visit: Payer: BC Managed Care – PPO | Attending: Internal Medicine | Admitting: Physical Therapy

## 2020-12-27 ENCOUNTER — Encounter: Payer: Self-pay | Admitting: Physical Therapy

## 2020-12-27 DIAGNOSIS — M542 Cervicalgia: Secondary | ICD-10-CM | POA: Diagnosis present

## 2020-12-27 DIAGNOSIS — M6281 Muscle weakness (generalized): Secondary | ICD-10-CM | POA: Insufficient documentation

## 2020-12-27 DIAGNOSIS — M25611 Stiffness of right shoulder, not elsewhere classified: Secondary | ICD-10-CM | POA: Insufficient documentation

## 2020-12-27 NOTE — Therapy (Signed)
[place] [facility] [address] [place], [address] Phone: [phone]   Fax:  [phone]  Physical Therapy Treatment  Patient Details  Name: Whitney Mclean MRN: [id number] Date of Birth: 1966-02-23 No data recorded  Encounter Date: 12/27/2020   PT End of Session - 12/27/20 1323    Visit Number 56    Number of Visits 70    Date for PT Re-Evaluation 03/22/21    Authorization - Visit Number 6    Authorization - Number of Visits 30    PT Start Time 1300    PT Stop Time 1340    PT Time Calculation (min) 40 min    Equipment Utilized During Treatment Gait belt    Activity Tolerance Patient tolerated treatment well    Behavior During Therapy WFL for tasks assessed/performed           Past Medical History:  Diagnosis Date  . Oral cancer ([place])   . Scleroderma [facility])     Past Surgical History:  Procedure Laterality Date  . ABDOMINAL HYSTERECTOMY      There were no vitals filed for this visit.   Subjective Assessment - 12/27/20 1306    Subjective Pt reports her blood pressure has been better, no dizzy spells, and no nausea. Is doing well overall completing HEP.    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 Shoulder pain started 3 months ago, no mechanism of injury, was getting better with heat. Reports she had pain for a about a month, and for the past 2 months the shoulder has been stiff. Has associated neck stiffness since her surgery last year . 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 Has trouble washing her hair, cannot tie her shoes d/t reaching and hand dysfunction, cannot sit up and hold her head up without neck discomfort at her neck, cannot dress herself normally. No pain in shoulder/neck over the past week, describes  discomfort/stiffness. 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    Limitations Sitting;Writing;House hold activities;Lifting    How long can you sit comfortably? 30    How long can you stand comfortably? unlimited    How long can you walk comfortably? unlimited    Diagnostic tests None on shoulder    Patient Stated Goals Increase shoulder strength and mobility    Pain Onset More than a month ago           THEREX -AA/ROM scapular protraction/retraction and thoracic spine rotation on NuStep; L69mins, seat7, SPM60s, Arms level9;cuing for cervical ext to neutral able to hold somewhat - Standing Y on wall1#3x 10 with min cuing for posture with good carry over - Lat pull down20#3x 10/8/8Min cuing for availible cervical ext with good carry over -Seatedbilat shoulder flexagainst wall1# DB 2x8with min cuing for posture, difficulty with final reps - Same set up as above for shoulder abd 2x 8 1# DB with good carry over of technique, some protraction   Manual Therapy to reduce pain and tissue tension, improve range of motion, neuromodulation, in order to promote improved ability to complete functional activities. -Passive cervical rotation and lateral bending to L , and ext off edge of mat table with supportsustained holds(able to get upper cervical spine off mat table)  PT Education - 12/27/20 1314    Education Details therex form/technique    Person(s) Educated Patient    Methods Explanation;Demonstration;Verbal cues    Comprehension Verbalized understanding;Returned demonstration;Verbal cues required            PT Short Term Goals - 09/15/19 1119      PT SHORT TERM GOAL #1   Title Pt will be independent with HEP in order to improve strength and decrease pain in order to improve pain-free function at home and work.    Baseline 08/04/19 Completing HEP regularly withotu question or concern    Time 4    Period Weeks    Status  Achieved             PT Long Term Goals - 09/19/20 1138      PT LONG TERM GOAL #1   Title Pt will decrease quick DASH score by at least 8% in order to demonstrate clinically significant reduction in disability.    Baseline 06/11/19 56.8%; 08/04/19 34%    Time 8    Period Weeks    Status Achieved      PT LONG TERM GOAL #2   Title Patient will demonstrate full cervical ROM in order to drive safely    Baseline ; 11/17/19 R/L rotation 35/25 Sidebending 33/31 ext -6d; ; 09/19/20 R/L rotation 35/30 Sidebending 38/46 ext 10d    Time 8    Period Weeks    Status On-going      PT LONG TERM GOAL #3   Title Patient will demonstrate full R shoulder flex A/PROM in order to complete overhead ADLs    Baseline 11/17/19 AROM flex 120d PROM flex 145; PROM/AROM of ER and IR WNL; 09/19/20 AROM flex 140d PROM flex 167    Time 8    Period Weeks    Status On-going      PT LONG TERM GOAL #4   Title Patient will demonstrate 4+/5 gross periscapular and shoulder strength in order to complete heavy household ADLs and overhead ADLs    Baseline 11/17/19 R/L shoulder flex 3+/4+ abd 4-/5  ER 3+/4+  IR 4/5 T 3+/4+ Y 2+/3+; 09/19/20 R/L shoulder flex 4-/4+ abd 4/5  ER 4-/4+  IR 4+/5 T 4-/4+ Y 3+/4-    Time 8    Period Weeks    Status On-going                 Plan - 12/27/20 1324    Clinical Impression Statement PT continued therex progression for increased mobility and strength with success without medical issue this session. Pt is able to comply with all cuing for proper technique of therex with good motivation throughout session and no increased pain. PT will continue progression as able.    Personal Factors and Comorbidities Age;Behavior Pattern;Comorbidity 1;Comorbidity 2;Past/Current Experience;Fitness;Sex;Time since onset of injury/illness/exacerbation    Comorbidities scleroderma, cancer    Examination-Activity Limitations Reach Overhead;Self Feeding;Dressing;Sleep;Lift     Examination-Participation Restrictions Laundry;Cleaning;Community Activity;Driving    Stability/Clinical Decision Making Evolving/Moderate complexity    Clinical Decision Making Moderate    Rehab Potential Good    PT Frequency 2x / week    PT Duration 8 weeks    PT Treatment/Interventions ADLs/Self Care Home Management;Aquatic Therapy;Moist Heat;Traction;Ultrasound;Cryotherapy;Parrafin;Functional mobility training;Neuromuscular re-education;Taping;Dry needling;Passive range of motion;Joint Manipulations;Spinal Manipulations;Patient/family education;Therapeutic exercise;Manual techniques;Therapeutic activities;Electrical Stimulation;Fluidtherapy;Iontophoresis 4mg /ml Dexamethasone    PT Next Visit Plan Increase cervical ROM; postural restoration    Consulted and Agree with Plan of Care Patient  Patient will benefit from skilled therapeutic intervention in order to improve the following deficits and impairments:  Decreased endurance,Decreased mobility,Pain,Postural dysfunction,Impaired UE functional use,Impaired flexibility,Increased fascial restricitons,Decreased strength,Decreased activity tolerance,Decreased range of motion,Decreased scar mobility,Impaired tone,Improper body mechanics,Hypomobility  Visit Diagnosis: Muscle weakness (generalized)  Stiffness of right shoulder, not elsewhere classified  Cervicalgia     Problem List There are no problems to display for this patient.  Durwin Reges DPT Durwin Reges 12/27/2020, 2:30 PM  [place] [facility] [address], [place], [address] Phone: [phone]   Fax:  [phone]  Name: Whitney Mclean MRN: [id number] Date of Birth: [birth year]

## 2021-01-10 ENCOUNTER — Encounter: Payer: BC Managed Care – PPO | Admitting: Physical Therapy

## 2021-01-24 ENCOUNTER — Encounter: Payer: Self-pay | Admitting: Physical Therapy

## 2021-01-24 ENCOUNTER — Ambulatory Visit: Payer: BC Managed Care – PPO | Attending: Internal Medicine | Admitting: Physical Therapy

## 2021-01-24 ENCOUNTER — Other Ambulatory Visit: Payer: Self-pay

## 2021-01-24 DIAGNOSIS — M6281 Muscle weakness (generalized): Secondary | ICD-10-CM | POA: Diagnosis present

## 2021-01-24 DIAGNOSIS — M542 Cervicalgia: Secondary | ICD-10-CM | POA: Insufficient documentation

## 2021-01-24 DIAGNOSIS — M25611 Stiffness of right shoulder, not elsewhere classified: Secondary | ICD-10-CM | POA: Insufficient documentation

## 2021-01-24 NOTE — Therapy (Signed)
[place] [facility] [address], [place], [address] Phone: [phone]   Fax:  [phone]  Physical Therapy Treatment  Patient Details  Name: Whitney Mclean MRN: [id number] Date of Birth: 1966-01-16 No data recorded  Encounter Date: 01/24/2021   PT End of Session - 01/24/21 1121    Visit Number 5    Number of Visits 86    Date for PT Re-Evaluation 03/22/21    Authorization Type [employer] 30appts jan 2022-jan 2023    Authorization - Visit Number 7    Authorization - Number of Visits 30    PT Start Time 1115    PT Stop Time 1200    PT Time Calculation (min) 45 min    Equipment Utilized During Treatment Gait belt    Activity Tolerance Patient tolerated treatment well    Behavior During Therapy WFL for tasks assessed/performed           Past Medical History:  Diagnosis Date  . Oral cancer ([place])   . Scleroderma [facility])     Past Surgical History:  Procedure Laterality Date  . ABDOMINAL HYSTERECTOMY      There were no vitals filed for this visit.   Subjective Assessment - 01/24/21 1118    Subjective Pt missed last PT session d/t issues with billing. Has been getting infusions for her scleroderma and this is going well. Her HEP is going well, reports she could be completing more often. 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 Shoulder pain started 3 months ago, no mechanism of injury, was getting better with heat. Reports she had pain for a about a month, and for the past 2 months the shoulder has been stiff. Has associated neck stiffness since her surgery last year . 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 Has trouble washing her hair, cannot tie her shoes d/t reaching and  hand dysfunction, cannot sit up and hold her head up without neck discomfort at her neck, cannot dress herself normally. No pain in shoulder/neck over the past week, describes discomfort/stiffness. 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    Limitations Sitting;Writing;House hold activities;Lifting    How long can you sit comfortably? 30    How long can you stand comfortably? unlimited    How long can you walk comfortably? unlimited    Diagnostic tests None on shoulder    Patient Stated Goals Increase shoulder strength and mobility    Pain Onset More than a month ago             THEREX -AA/ROM scapular protraction/retraction and thoracic spine rotation on NuStep; L10mins, seat7, SPM60s, Arms level9;cuing for cervical ext to neutral able to hold somewhat TRX RUE flexion walk outs x12 3sec hold with good carry over from set up - Standing Y on wall1#3x 10 with min cuing forposturewith good carry over - Lat pull down20#2x 10Min cuing for availible cervical ext with good carry over -Seated overhead press 2# DB x6; 1# DB x8 with min cuing for full R shoulder flex, better  - Same set up as above for shoulder abd 2x 8 1# DB with good carry over of technique, some protraction  Manual Therapy  to reduce pain and tissue tension, improve range of motion, neuromodulation, in order to promote improved ability to complete functional activities. -Passive cervical rotation and lateral bending to L , and ext off edge of mat table with supportsustained holds(able to get upper cervical spine off mat table) - RUE flex with G3 mobilization 4 bouts 30sec                         PT Education - 01/24/21 1121    Education Details therex form/technique    Person(s) Educated Patient    Methods Explanation;Demonstration;Verbal cues    Comprehension Verbalized understanding;Returned demonstration;Verbal cues required            PT Short Term Goals -  09/15/19 1119      PT SHORT TERM GOAL #1   Title Pt will be independent with HEP in order to improve strength and decrease pain in order to improve pain-free function at home and work.    Baseline 08/04/19 Completing HEP regularly withotu question or concern    Time 4    Period Weeks    Status Achieved             PT Long Term Goals - 09/19/20 1138      PT LONG TERM GOAL #1   Title Pt will decrease quick DASH score by at least 8% in order to demonstrate clinically significant reduction in disability.    Baseline 06/11/19 56.8%; 08/04/19 34%    Time 8    Period Weeks    Status Achieved      PT LONG TERM GOAL #2   Title Patient will demonstrate full cervical ROM in order to drive safely    Baseline ; 11/17/19 R/L rotation 35/25 Sidebending 33/31 ext -6d; ; 09/19/20 R/L rotation 35/30 Sidebending 38/46 ext 10d    Time 8    Period Weeks    Status On-going      PT LONG TERM GOAL #3   Title Patient will demonstrate full R shoulder flex A/PROM in order to complete overhead ADLs    Baseline 11/17/19 AROM flex 120d PROM flex 145; PROM/AROM of ER and IR WNL; 09/19/20 AROM flex 140d PROM flex 167    Time 8    Period Weeks    Status On-going      PT LONG TERM GOAL #4   Title Patient will demonstrate 4+/5 gross periscapular and shoulder strength in order to complete heavy household ADLs and overhead ADLs    Baseline 11/17/19 R/L shoulder flex 3+/4+ abd 4-/5  ER 3+/4+  IR 4/5 T 3+/4+ Y 2+/3+; 09/19/20 R/L shoulder flex 4-/4+ abd 4/5  ER 4-/4+  IR 4+/5 T 4-/4+ Y 3+/4-    Time 8    Period Weeks    Status On-going                 Plan - 01/24/21 1128    Clinical Impression Statement PT utilized increased therex and manual techniques for increased R shoulder mobility and decreased pain with success. Patient is able to comply with all cuing for proper technique of therex with minimal modifications made d/t slight decrease in R shoulder mobility following PT absence. Pt with good  motivation and no increased pain throughout session. PT will continue progression as able.    Personal Factors and Comorbidities Age;Behavior Pattern;Comorbidity 1;Comorbidity 2;Past/Current Experience;Fitness;Sex;Time since onset of injury/illness/exacerbation    Comorbidities scleroderma, cancer    Examination-Activity Limitations Reach Overhead;Self Feeding;Dressing;Sleep;Lift  Examination-Participation Restrictions Laundry;Cleaning;Community Activity;Driving    Stability/Clinical Decision Making Evolving/Moderate complexity    Clinical Decision Making Moderate    Rehab Potential Good    PT Frequency 2x / week    PT Duration 8 weeks    PT Treatment/Interventions ADLs/Self Care Home Management;Aquatic Therapy;Moist Heat;Traction;Ultrasound;Cryotherapy;Parrafin;Functional mobility training;Neuromuscular re-education;Taping;Dry needling;Passive range of motion;Joint Manipulations;Spinal Manipulations;Patient/family education;Therapeutic exercise;Manual techniques;Therapeutic activities;Electrical Stimulation;Fluidtherapy;Iontophoresis 4mg /ml Dexamethasone    PT Next Visit Plan Increase cervical ROM; postural restoration    Consulted and Agree with Plan of Care Patient           Patient will benefit from skilled therapeutic intervention in order to improve the following deficits and impairments:  Decreased endurance,Decreased mobility,Pain,Postural dysfunction,Impaired UE functional use,Impaired flexibility,Increased fascial restricitons,Decreased strength,Decreased activity tolerance,Decreased range of motion,Decreased scar mobility,Impaired tone,Improper body mechanics,Hypomobility  Visit Diagnosis: Muscle weakness (generalized)  Stiffness of right shoulder, not elsewhere classified  Cervicalgia     Problem List There are no problems to display for this patient.  Durwin Reges DPT Durwin Reges 01/24/2021, 11:42 AM  [place] [facility] [address], [place], [address] Phone: [phone]   Fax:  [phone]  Name: SHAKETHA JEON MRN: [id number] Date of Birth: 11-24-1965

## 2021-02-07 ENCOUNTER — Ambulatory Visit: Payer: BC Managed Care – PPO | Admitting: Physical Therapy

## 2021-02-07 ENCOUNTER — Other Ambulatory Visit: Payer: Self-pay

## 2021-02-07 ENCOUNTER — Encounter: Payer: Self-pay | Admitting: Physical Therapy

## 2021-02-07 DIAGNOSIS — M6281 Muscle weakness (generalized): Secondary | ICD-10-CM

## 2021-02-07 DIAGNOSIS — M25611 Stiffness of right shoulder, not elsewhere classified: Secondary | ICD-10-CM

## 2021-02-07 NOTE — Therapy (Signed)
[facility] [address], [place], [address] Phone: [phone]   Fax:  [phone]  Physical Therapy Treatment  Patient Details  Name: Whitney Mclean MRN: [id number] Date of Birth: 05-19-1966 No data recorded  Encounter Date: 02/07/2021   PT End of Session - 02/07/21 1119    Visit Number 44    Number of Visits 70    Date for PT Re-Evaluation 03/22/21    Authorization Type [employer] 30appts jan 2022-jan 2023    Authorization - Visit Number 8    Authorization - Number of Visits 30    PT Start Time 1115    PT Stop Time 1155    PT Time Calculation (min) 40 min    Activity Tolerance Patient tolerated treatment well    Behavior During Therapy [facility] for tasks assessed/performed           Past Medical History:  Diagnosis Date  . Oral cancer ([place])   . Scleroderma [facility])     Past Surgical History:  Procedure Laterality Date  . ABDOMINAL HYSTERECTOMY      There were no vitals filed for this visit.   Subjective Assessment - 02/07/21 1118    Subjective Is doing better from last session, reporting no R shoulder pain today. Patient reports compliance with HEP. Is still completing infusions, reports this is goind wel.    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 Shoulder pain started 3 months ago, no mechanism of injury, was getting better with heat. Reports she had pain for a about a month, and for the past 2 months the shoulder has been stiff. Has associated neck stiffness since her surgery last year . 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 Has trouble washing her hair, cannot tie her shoes d/t reaching and hand dysfunction, cannot sit up and hold her head up without neck discomfort at her neck, cannot dress herself normally. No pain  in shoulder/neck over the past week, describes discomfort/stiffness. 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    Limitations Sitting;Writing;House hold activities;Lifting    How long can you sit comfortably? 30    How long can you stand comfortably? unlimited    How long can you walk comfortably? unlimited    Diagnostic tests None on shoulder    Patient Stated Goals Increase shoulder strength and mobility    Pain Onset More than a month ago            THEREX -AA/ROM scapular protraction/retraction and thoracic spine rotation on NuStep; L23mins, seat7, SPM60s, Arms level9;cuing for cervical ext to neutral able to hold somewhat - Lat pull down20#2x 10Min cuing for availible cervical ext with good carry over - OMEGA chest press, slight overhead 15# 3x 8 with min cuing for proper technique -Seated overhead press 2# DB 2 x6 with min cuing for technique without excessive protraction with good carry over - Same set up as above for shoulder abd 2# DB x2; 1# x8 with good carry over of technique, some protraction  Manual Therapy to reduce pain and tissue tension, improve range of motion, neuromodulation, in order to promote improved ability to complete functional activities. -Passive cervical rotation and lateral bending to L , and ext off edge of mat table with  supportsustained holds(able to get upper cervical spine off mat table) - RUE flex with G3 mobilization 4 bouts 30sec            PT Education - 02/07/21 1119    Education Details therex form/technique    Person(s) Educated Patient    Methods Explanation;Demonstration;Verbal cues    Comprehension Verbalized understanding;Verbal cues required;Returned demonstration            PT Short Term Goals - 09/15/19 1119      PT SHORT TERM GOAL #1   Title Pt will be independent with HEP in order to improve strength and decrease pain in order to improve pain-free function at home and work.    Baseline  08/04/19 Completing HEP regularly withotu question or concern    Time 4    Period Weeks    Status Achieved             PT Long Term Goals - 09/19/20 1138      PT LONG TERM GOAL #1   Title Pt will decrease quick DASH score by at least 8% in order to demonstrate clinically significant reduction in disability.    Baseline 06/11/19 56.8%; 08/04/19 34%    Time 8    Period Weeks    Status Achieved      PT LONG TERM GOAL #2   Title Patient will demonstrate full cervical ROM in order to drive safely    Baseline ; 11/17/19 R/L rotation 35/25 Sidebending 33/31 ext -6d; ; 09/19/20 R/L rotation 35/30 Sidebending 38/46 ext 10d    Time 8    Period Weeks    Status On-going      PT LONG TERM GOAL #3   Title Patient will demonstrate full R shoulder flex A/PROM in order to complete overhead ADLs    Baseline 11/17/19 AROM flex 120d PROM flex 145; PROM/AROM of ER and IR WNL; 09/19/20 AROM flex 140d PROM flex 167    Time 8    Period Weeks    Status On-going      PT LONG TERM GOAL #4   Title Patient will demonstrate 4+/5 gross periscapular and shoulder strength in order to complete heavy household ADLs and overhead ADLs    Baseline 11/17/19 R/L shoulder flex 3+/4+ abd 4-/5  ER 3+/4+  IR 4/5 T 3+/4+ Y 2+/3+; 09/19/20 R/L shoulder flex 4-/4+ abd 4/5  ER 4-/4+  IR 4+/5 T 4-/4+ Y 3+/4-    Time 8    Period Weeks    Status On-going                 Plan - 02/07/21 1131    Clinical Impression Statement Patient is able to comply with all cuing for propert echnique of therex with good motivaiton throughout session. Patinet is able to demonstrate continued steady progression of resistance in familiar therex, and increased postural awareness. PT will continue progression as able.    Personal Factors and Comorbidities Age;Behavior Pattern;Comorbidity 1;Comorbidity 2;Past/Current Experience;Fitness;Sex;Time since onset of injury/illness/exacerbation    Comorbidities scleroderma, cancer     Examination-Activity Limitations Reach Overhead;Self Feeding;Dressing;Sleep;Lift    Examination-Participation Restrictions Laundry;Cleaning;Community Activity;Driving    Stability/Clinical Decision Making Evolving/Moderate complexity    Clinical Decision Making Moderate    Rehab Potential Good    PT Frequency 2x / week    PT Duration 8 weeks    PT Treatment/Interventions ADLs/Self Care Home Management;Aquatic Therapy;Moist Heat;Traction;Ultrasound;Cryotherapy;Parrafin;Functional mobility training;Neuromuscular re-education;Taping;Dry needling;Passive range of motion;Joint Manipulations;Spinal Manipulations;Patient/family education;Therapeutic exercise;Manual techniques;Therapeutic activities;Electrical Stimulation;Fluidtherapy;Iontophoresis 4mg /ml Dexamethasone  PT Next Visit Plan Increase cervical ROM; postural restoration    Consulted and Agree with Plan of Care Patient           Patient will benefit from skilled therapeutic intervention in order to improve the following deficits and impairments:  Decreased endurance,Decreased mobility,Pain,Postural dysfunction,Impaired UE functional use,Impaired flexibility,Increased fascial restricitons,Decreased strength,Decreased activity tolerance,Decreased range of motion,Decreased scar mobility,Impaired tone,Improper body mechanics,Hypomobility  Visit Diagnosis: Muscle weakness (generalized)  Stiffness of right shoulder, not elsewhere classified     Problem List There are no problems to display for this patient.  Durwin Reges DPT Durwin Reges 02/07/2021, 11:54 AM  [place] [facility] [address] [place], [address] Phone: [phone]   Fax:  [phone]  Name: Whitney Mclean MRN: [id number] Date of Birth: 12/10/1965

## 2021-02-21 ENCOUNTER — Ambulatory Visit: Payer: BC Managed Care – PPO | Attending: Internal Medicine | Admitting: Physical Therapy

## 2021-02-21 ENCOUNTER — Encounter: Payer: Self-pay | Admitting: Physical Therapy

## 2021-02-21 ENCOUNTER — Other Ambulatory Visit: Payer: Self-pay

## 2021-02-21 DIAGNOSIS — M25611 Stiffness of right shoulder, not elsewhere classified: Secondary | ICD-10-CM | POA: Diagnosis present

## 2021-02-21 DIAGNOSIS — M6281 Muscle weakness (generalized): Secondary | ICD-10-CM | POA: Insufficient documentation

## 2021-02-21 DIAGNOSIS — M542 Cervicalgia: Secondary | ICD-10-CM | POA: Diagnosis present

## 2021-02-21 NOTE — Therapy (Signed)
[facility] [facility] [address] [place], [address] Phone: [phone]   Fax:  [phone]  Physical Therapy Treatment  Patient Details  Name: Whitney Mclean MRN: [id number] Date of Birth: 08-05-1966 No data recorded  Encounter Date: 02/21/2021   PT End of Session - 02/21/21 1124    Visit Number 35    Number of Visits 70    Date for PT Re-Evaluation 03/22/21    Authorization Type [employer] 30appts jan 2022-jan 2023    Authorization - Visit Number 9    Authorization - Number of Visits 30    PT Start Time 1112    PT Stop Time 1150    PT Time Calculation (min) 38 min    Equipment Utilized During Treatment Gait belt    Activity Tolerance Patient tolerated treatment well    Behavior During Therapy WFL for tasks assessed/performed           Past Medical History:  Diagnosis Date  . Oral cancer ([place])   . Scleroderma [facility])     Past Surgical History:  Procedure Laterality Date  . ABDOMINAL HYSTERECTOMY      There were no vitals filed for this visit.   Subjective Assessment - 02/21/21 1121    Subjective Is doing well overall. Reports compliance with HEP. 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 Shoulder pain started 3 months ago, no mechanism of injury, was getting better with heat. Reports she had pain for a about a month, and for the past 2 months the shoulder has been stiff. Has associated neck stiffness since her surgery last year . 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 Has trouble washing her hair, cannot tie her shoes d/t reaching and hand dysfunction, cannot sit up and hold her head up without neck discomfort at her neck, cannot dress herself normally. No pain in shoulder/neck over the past week, describes  discomfort/stiffness. 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    Limitations Sitting;Writing;House hold activities;Lifting    How long can you sit comfortably? 30    How long can you stand comfortably? unlimited    How long can you walk comfortably? unlimited    Diagnostic tests None on shoulder    Patient Stated Goals Increase shoulder strength and mobility    Pain Onset More than a month ago             THEREX -AA/ROM scapular protraction/retraction and thoracic spine rotation on NuStep; L1mins, seat7, SPM60s, Arms level9;cuing for cervical ext to neutral able to hold somewhat - Lat pull down20#2x 10Min cuing for availible cervical ext with good carry over - OMEGA chest press, slight overhead 15# 2x 8/10 with min cuing for proper technique -Squat with overhead press 2# DB 2 x6; 3# DB x6 with min cuing for technique initially with good carry over - Seated with postural cue of wall behind back; shoulder abd 1# 2x 9/8 with good carry over of technique, some protraction  Manual Therapy to reduce pain and tissue tension, improve range of motion, neuromodulation, in order to promote improved ability to complete functional activities. -Passive cervical rotation and lateral bending to L , and ext off edge of mat table with supportsustained holds(able  to get upper cervical spine off mat table) - RUE flex with G3 mobilization 4 bouts 30sec                           PT Short Term Goals - 09/15/19 1119      PT SHORT TERM GOAL #1   Title Pt will be independent with HEP in order to improve strength and decrease pain in order to improve pain-free function at home and work.    Baseline 08/04/19 Completing HEP regularly withotu question or concern    Time 4    Period Weeks    Status Achieved             PT Long Term Goals - 09/19/20 1138      PT LONG TERM GOAL #1   Title Pt will decrease quick DASH score by at least 8% in order to  demonstrate clinically significant reduction in disability.    Baseline 06/11/19 56.8%; 08/04/19 34%    Time 8    Period Weeks    Status Achieved      PT LONG TERM GOAL #2   Title Patient will demonstrate full cervical ROM in order to drive safely    Baseline ; 11/17/19 R/L rotation 35/25 Sidebending 33/31 ext -6d; ; 09/19/20 R/L rotation 35/30 Sidebending 38/46 ext 10d    Time 8    Period Weeks    Status On-going      PT LONG TERM GOAL #3   Title Patient will demonstrate full R shoulder flex A/PROM in order to complete overhead ADLs    Baseline 11/17/19 AROM flex 120d PROM flex 145; PROM/AROM of ER and IR WNL; 09/19/20 AROM flex 140d PROM flex 167    Time 8    Period Weeks    Status On-going      PT LONG TERM GOAL #4   Title Patient will demonstrate 4+/5 gross periscapular and shoulder strength in order to complete heavy household ADLs and overhead ADLs    Baseline 11/17/19 R/L shoulder flex 3+/4+ abd 4-/5  ER 3+/4+  IR 4/5 T 3+/4+ Y 2+/3+; 09/19/20 R/L shoulder flex 4-/4+ abd 4/5  ER 4-/4+  IR 4+/5 T 4-/4+ Y 3+/4-    Time 8    Period Weeks    Status On-going                 Plan - 02/21/21 1137    Clinical Impression Statement PT continued therex progression for increased mobility and strength with success. Pt is able to comply with all cuing for proper technique of therex with good motivation and no increased pain throughout session. PT continued progression for functional overhead lifting where paitent is continuing to make steady progress. PT will continue progression as able.    Personal Factors and Comorbidities Age;Behavior Pattern;Comorbidity 1;Comorbidity 2;Past/Current Experience;Fitness;Sex;Time since onset of injury/illness/exacerbation    Comorbidities scleroderma, cancer    Examination-Activity Limitations Reach Overhead;Self Feeding;Dressing;Sleep;Lift    Examination-Participation Restrictions Laundry;Cleaning;Community Activity;Driving    Stability/Clinical  Decision Making Evolving/Moderate complexity    Clinical Decision Making Moderate    Rehab Potential Good    PT Frequency 2x / week    PT Duration 8 weeks    PT Treatment/Interventions ADLs/Self Care Home Management;Aquatic Therapy;Moist Heat;Traction;Ultrasound;Cryotherapy;Parrafin;Functional mobility training;Neuromuscular re-education;Taping;Dry needling;Passive range of motion;Joint Manipulations;Spinal Manipulations;Patient/family education;Therapeutic exercise;Manual techniques;Therapeutic activities;Electrical Stimulation;Fluidtherapy;Iontophoresis 4mg /ml Dexamethasone    PT Next Visit Plan Increase cervical ROM; postural restoration    Consulted and Agree  with Plan of Care Patient           Patient will benefit from skilled therapeutic intervention in order to improve the following deficits and impairments:  Decreased endurance,Decreased mobility,Pain,Postural dysfunction,Impaired UE functional use,Impaired flexibility,Increased fascial restricitons,Decreased strength,Decreased activity tolerance,Decreased range of motion,Decreased scar mobility,Impaired tone,Improper body mechanics,Hypomobility  Visit Diagnosis: Muscle weakness (generalized)  Stiffness of right shoulder, not elsewhere classified  Cervicalgia     Problem List There are no problems to display for this patient.  Durwin Reges DPT Durwin Reges 02/21/2021, 11:46 AM  [facility] [address], [place], [address] Phone: [phone]   Fax:  [phone]  Name: Whitney Mclean MRN: [id number] Date of Birth: 1966-03-29

## 2021-03-07 ENCOUNTER — Other Ambulatory Visit: Payer: Self-pay

## 2021-03-07 ENCOUNTER — Ambulatory Visit: Payer: BC Managed Care – PPO | Admitting: Physical Therapy

## 2021-03-07 ENCOUNTER — Encounter: Payer: Self-pay | Admitting: Physical Therapy

## 2021-03-07 DIAGNOSIS — M542 Cervicalgia: Secondary | ICD-10-CM

## 2021-03-07 DIAGNOSIS — M6281 Muscle weakness (generalized): Secondary | ICD-10-CM

## 2021-03-07 DIAGNOSIS — M25611 Stiffness of right shoulder, not elsewhere classified: Secondary | ICD-10-CM

## 2021-03-07 NOTE — Therapy (Signed)
[facility] [address], [place], [address] Phone: [phone]   Fax:  [phone]  Physical Therapy Treatment  Patient Details  Name: Whitney Mclean MRN: [id number] Date of Birth: 05-02-1966 No data recorded  Encounter Date: 03/07/2021   PT End of Session - 03/07/21 1137     Visit Number 60    Number of Visits 26    Date for PT Re-Evaluation 03/22/21    Authorization Type [employer] 30appts jan 2022-jan 2023    Authorization - Visit Number 10    Authorization - Number of Visits 30    PT Start Time 1114    PT Stop Time 1155    PT Time Calculation (min) 41 min    Equipment Utilized During Treatment Gait belt    Activity Tolerance Patient tolerated treatment well    Behavior During Therapy WFL for tasks assessed/performed             Past Medical History:  Diagnosis Date   Oral cancer ([place])    Scleroderma ([place])     Past Surgical History:  Procedure Laterality Date   ABDOMINAL HYSTERECTOMY      There were no vitals filed for this visit.   Subjective Assessment - 03/07/21 1130     Subjective Pt is doing well overall. Reports the dizziness when she lays down was not present at the dentist yesterday. Is completing HEP.    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 Shoulder pain started 3 months ago, no mechanism of injury, was getting better with heat. Reports she had pain for a about a month, and for the past 2 months the shoulder has been stiff. Has associated neck stiffness since her surgery last year . 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 Has trouble washing her hair, cannot tie her shoes d/t reaching and hand dysfunction, cannot sit up and hold her head up without neck discomfort at her neck, cannot dress herself  normally. No pain in shoulder/neck over the past week, describes discomfort/stiffness. 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    Limitations Sitting;Writing;House hold activities;Lifting    How long can you sit comfortably? 30    How long can you stand comfortably? unlimited    How long can you walk comfortably? unlimited    Diagnostic tests None on shoulder    Patient Stated Goals Increase shoulder strength and mobility    Pain Onset More than a month ago             THEREX - AA/ROM scapular protraction/retraction and thoracic spine rotation on NuStep; L3 29mins, seat7, SPM 60s, Arms level 9; cuing for cervical ext to neutral able to hold somewhat  - Lat pull down 20# x10; 25# x10 Min cuing for availible cervical ext with good carry over - OMEGA chest press, slight overhead 15# 2x 8/10 with min cuing for proper technique - Squat with overhead press 3# DB 2 x8/6 with min cuing for technique initially with good carry over; more difficulty with R UE raise - Seated wand scaption with 5# AW 2x 6 with RUE lagging left good aarom at end range    Manual Therapy  to reduce pain and tissue tension, improve range of motion, neuromodulation, in order  to promote improved ability to complete functional activities. - Passive cervical rotation and lateral bending to L , and ext off edge of mat table with support sustained holds (able to get upper cervical spine off mat table) - RUE flex with G3 mobilization 4 bouts 30sec                              PT Education - 03/07/21 1137     Education Details therex form/technique    Person(s) Educated Patient    Methods Explanation;Demonstration;Verbal cues    Comprehension Verbalized understanding;Returned demonstration;Verbal cues required              PT Short Term Goals - 09/15/19 1119       PT SHORT TERM GOAL #1   Title Pt will be independent with HEP in order to improve strength and decrease pain in  order to improve pain-free function at home and work.    Baseline 08/04/19 Completing HEP regularly withotu question or concern    Time 4    Period Weeks    Status Achieved               PT Long Term Goals - 09/19/20 1138       PT LONG TERM GOAL #1   Title Pt will decrease quick DASH score by at least 8% in order to demonstrate clinically significant reduction in disability.    Baseline 06/11/19 56.8%; 08/04/19 34%    Time 8    Period Weeks    Status Achieved      PT LONG TERM GOAL #2   Title Patient will demonstrate full cervical ROM in order to drive safely    Baseline ; 11/17/19 R/L rotation 35/25 Sidebending 33/31 ext -6d; ; 09/19/20 R/L rotation 35/30 Sidebending 38/46 ext 10d    Time 8    Period Weeks    Status On-going      PT LONG TERM GOAL #3   Title Patient will demonstrate full R shoulder flex A/PROM in order to complete overhead ADLs    Baseline 11/17/19 AROM flex 120d PROM flex 145; PROM/AROM of ER and IR WNL; 09/19/20 AROM flex 140d PROM flex 167    Time 8    Period Weeks    Status On-going      PT LONG TERM GOAL #4   Title Patient will demonstrate 4+/5 gross periscapular and shoulder strength in order to complete heavy household ADLs and overhead ADLs    Baseline 11/17/19 R/L shoulder flex 3+/4+ abd 4-/5  ER 3+/4+  IR 4/5 T 3+/4+ Y 2+/3+; 09/19/20 R/L shoulder flex 4-/4+ abd 4/5  ER 4-/4+  IR 4+/5 T 4-/4+ Y 3+/4-    Time 8    Period Weeks    Status On-going                   Plan - 03/07/21 1245     Clinical Impression Statement PT continued therex progression for increased mobility and strength with success. Pt continued slow progression of therex with patient demonstrating good carry over of all multimodal cuing. Patient with evident decrease in RUE motion> LUE. Pt is motivated throughout session with no increase in pain. PT will continue progression as able.    Personal Factors and Comorbidities Age;Behavior Pattern;Comorbidity 1;Comorbidity  2;Past/Current Experience;Fitness;Sex;Time since onset of injury/illness/exacerbation    Comorbidities scleroderma, cancer    Examination-Activity Limitations Reach Overhead;Self Feeding;Dressing;Sleep;Lift    Examination-Participation  Restrictions Laundry;Cleaning;Community Activity;Driving    Stability/Clinical Decision Making Evolving/Moderate complexity    Clinical Decision Making Moderate    Rehab Potential Good    PT Frequency 2x / week    PT Duration 8 weeks    PT Treatment/Interventions ADLs/Self Care Home Management;Aquatic Therapy;Moist Heat;Traction;Ultrasound;Cryotherapy;Parrafin;Functional mobility training;Neuromuscular re-education;Taping;Dry needling;Passive range of motion;Joint Manipulations;Spinal Manipulations;Patient/family education;Therapeutic exercise;Manual techniques;Therapeutic activities;Electrical Stimulation;Fluidtherapy;Iontophoresis 4mg /ml Dexamethasone    PT Next Visit Plan Increase cervical ROM; postural restoration    Consulted and Agree with Plan of Care Patient             Patient will benefit from skilled therapeutic intervention in order to improve the following deficits and impairments:  Decreased endurance, Decreased mobility, Pain, Postural dysfunction, Impaired UE functional use, Impaired flexibility, Increased fascial restricitons, Decreased strength, Decreased activity tolerance, Decreased range of motion, Decreased scar mobility, Impaired tone, Improper body mechanics, Hypomobility  Visit Diagnosis: Muscle weakness (generalized)  Stiffness of right shoulder, not elsewhere classified  Cervicalgia     Problem List There are no problems to display for this patient.  Durwin Reges DPT Durwin Reges 03/07/2021, 1:04 PM  [place] [facility] [address], [place], [address] Phone: [phone]   Fax:  [phone]  Name: YURY SCHAUS MRN: [id number] Date of  Birth: 1966-09-19

## 2021-03-21 ENCOUNTER — Other Ambulatory Visit: Payer: Self-pay

## 2021-03-21 ENCOUNTER — Ambulatory Visit: Payer: BC Managed Care – PPO | Admitting: Physical Therapy

## 2021-03-21 ENCOUNTER — Encounter: Payer: Self-pay | Admitting: Physical Therapy

## 2021-03-21 DIAGNOSIS — M25611 Stiffness of right shoulder, not elsewhere classified: Secondary | ICD-10-CM

## 2021-03-21 DIAGNOSIS — M6281 Muscle weakness (generalized): Secondary | ICD-10-CM

## 2021-03-21 DIAGNOSIS — M542 Cervicalgia: Secondary | ICD-10-CM

## 2021-03-21 NOTE — Therapy (Signed)
[facility] [address] [place], [address] Phone: [phone]   Fax:  [phone]  Physical Therapy Treatment  Patient Details  Name: Whitney Mclean MRN: [id number] Date of Birth: [birth year] No data recorded  Encounter Date: 03/21/2021   PT End of Session - 03/21/21 1133     Visit Number 61    Number of Visits 70    Date for PT Re-Evaluation 03/22/21    Authorization - Visit Number 11    Authorization - Number of Visits 30    PT Start Time 1114    PT Stop Time 0174    PT Time Calculation (min) 38 min    Activity Tolerance Patient tolerated treatment well    Behavior During Therapy WFL for tasks assessed/performed             Past Medical History:  Diagnosis Date   Oral cancer ([place])    Scleroderma ([place])     Past Surgical History:  Procedure Laterality Date   ABDOMINAL HYSTERECTOMY      There were no vitals filed for this visit.   Subjective Assessment - 03/21/21 1122     Subjective Doing well overall. Reports she is completing her HEP without questions or concern. No dizziness since last visit.    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 Shoulder pain started 3 months ago, no mechanism of injury, was getting better with heat. Reports she had pain for a about a month, and for the past 2 months the shoulder has been stiff. Has associated neck stiffness since her surgery last year . 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 Has trouble washing her hair, cannot tie her shoes d/t reaching and hand dysfunction, cannot sit up and hold her head up without neck discomfort at her neck, cannot dress herself normally. No pain in shoulder/neck over the past week, describes discomfort/stiffness. Is seeing OT bilat hands d/t  scleraderma stiffness.    Limitations Sitting;Writing;House hold activities;Lifting    How long can you sit comfortably? 30    How long can you stand comfortably? unlimited    How long can you walk comfortably? unlimited    Diagnostic tests None on shoulder    Patient Stated Goals Increase shoulder strength and mobility    Pain Onset More than a month ago              THEREX - AA/ROM scapular protraction/retraction and thoracic spine rotation on NuStep; L3 61mins, seat7, SPM 60s, Arms level 9; cuing for cervical ext to neutral able to hold somewhat  - Lat pull down 25# 2 x10 Min encouragement needed for full pull down - OMEGA chest press, slight overhead 20# 2x 8 with good carry over of cuing for proper technique - Squat with overhead press 1 5# DB 2 x8 with min cuing for R elbow ext with overhead  - Seated wand scaption with 5# AW 2x 6 with RUE lagging left good aarom at end range    Manual Therapy  to reduce pain and tissue tension, improve range of motion, neuromodulation, in order to promote improved ability to complete functional activities. - Passive cervical rotation and lateral bending to L , and ext off edge of mat table with support sustained  holds (able to get upper cervical spine off mat table) - RUE flex with G3 mobilization 4 bouts 30sec                             PT Education - 03/21/21 1133     Education Details therex form/technique    Person(s) Educated Patient    Methods Explanation;Demonstration;Verbal cues    Comprehension Verbalized understanding;Returned demonstration;Verbal cues required              PT Short Term Goals - 09/15/19 1119       PT SHORT TERM GOAL #1   Title Pt will be independent with HEP in order to improve strength and decrease pain in order to improve pain-free function at home and work.    Baseline 08/04/19 Completing HEP regularly withotu question or concern    Time 4    Period Weeks    Status Achieved                PT Long Term Goals - 09/19/20 1138       PT LONG TERM GOAL #1   Title Pt will decrease quick DASH score by at least 8% in order to demonstrate clinically significant reduction in disability.    Baseline 06/11/19 56.8%; 08/04/19 34%    Time 8    Period Weeks    Status Achieved      PT LONG TERM GOAL #2   Title Patient will demonstrate full cervical ROM in order to drive safely    Baseline ; 11/17/19 R/L rotation 35/25 Sidebending 33/31 ext -6d; ; 09/19/20 R/L rotation 35/30 Sidebending 38/46 ext 10d    Time 8    Period Weeks    Status On-going      PT LONG TERM GOAL #3   Title Patient will demonstrate full R shoulder flex A/PROM in order to complete overhead ADLs    Baseline 11/17/19 AROM flex 120d PROM flex 145; PROM/AROM of ER and IR WNL; 09/19/20 AROM flex 140d PROM flex 167    Time 8    Period Weeks    Status On-going      PT LONG TERM GOAL #4   Title Patient will demonstrate 4+/5 gross periscapular and shoulder strength in order to complete heavy household ADLs and overhead ADLs    Baseline 11/17/19 R/L shoulder flex 3+/4+ abd 4-/5  ER 3+/4+  IR 4/5 T 3+/4+ Y 2+/3+; 09/19/20 R/L shoulder flex 4-/4+ abd 4/5  ER 4-/4+  IR 4+/5 T 4-/4+ Y 3+/4-    Time 8    Period Weeks    Status On-going                   Plan - 03/21/21 1141     Clinical Impression Statement PT continued therex progression for increased strength and mobility with success. Patient is able to complete therex with progression of resistance with excellent carry over of cuing and predicted rest breaks needed. Patient is motivated throughout session to complete planned therex. PT will continue progression as able, reassessment next session.    Personal Factors and Comorbidities Age;Behavior Pattern;Comorbidity 1;Comorbidity 2;Past/Current Experience;Fitness;Sex;Time since onset of injury/illness/exacerbation    Comorbidities scleroderma, cancer    Examination-Activity Limitations Reach  Overhead;Self Feeding;Dressing;Sleep;Lift    Examination-Participation Restrictions Laundry;Cleaning;Community Activity;Driving    Stability/Clinical Decision Making Evolving/Moderate complexity    Clinical Decision Making Moderate    Rehab Potential Good    PT Frequency 2x /  week    PT Duration 8 weeks    PT Treatment/Interventions ADLs/Self Care Home Management;Aquatic Therapy;Moist Heat;Traction;Ultrasound;Cryotherapy;Parrafin;Functional mobility training;Neuromuscular re-education;Taping;Dry needling;Passive range of motion;Joint Manipulations;Spinal Manipulations;Patient/family education;Therapeutic exercise;Manual techniques;Therapeutic activities;Electrical Stimulation;Fluidtherapy;Iontophoresis 4mg /ml Dexamethasone    PT Next Visit Plan Increase cervical ROM; postural restoration; REASSESS    Consulted and Agree with Plan of Care Patient             Patient will benefit from skilled therapeutic intervention in order to improve the following deficits and impairments:  Decreased endurance, Decreased mobility, Pain, Postural dysfunction, Impaired UE functional use, Impaired flexibility, Increased fascial restricitons, Decreased strength, Decreased activity tolerance, Decreased range of motion, Decreased scar mobility, Impaired tone, Improper body mechanics, Hypomobility  Visit Diagnosis: Muscle weakness (generalized)  Stiffness of right shoulder, not elsewhere classified  Cervicalgia     Problem List There are no problems to display for this patient.  Durwin Reges DPT Durwin Reges 03/21/2021, 1:20 PM  [facility] [address], [place], [address] Phone: [phone]   Fax:  [phone]  Name: Whitney Mclean MRN: [id number] Date of Birth: [birth year]

## 2021-04-04 ENCOUNTER — Encounter: Payer: Self-pay | Admitting: Physical Therapy

## 2021-04-04 ENCOUNTER — Ambulatory Visit: Payer: BC Managed Care – PPO | Attending: Internal Medicine | Admitting: Physical Therapy

## 2021-04-04 DIAGNOSIS — M25611 Stiffness of right shoulder, not elsewhere classified: Secondary | ICD-10-CM | POA: Diagnosis present

## 2021-04-04 DIAGNOSIS — M6281 Muscle weakness (generalized): Secondary | ICD-10-CM | POA: Diagnosis not present

## 2021-04-04 DIAGNOSIS — M542 Cervicalgia: Secondary | ICD-10-CM | POA: Insufficient documentation

## 2021-04-04 NOTE — Therapy (Signed)
[facility] [address], [place], [address] Phone: [phone]   Fax:  [phone]  Physical Therapy Treatment/ Progress Note Reporting Period 09/19/21 - 04/04/21  Patient Details  Name: Whitney Mclean MRN: [id number] Date of Birth: 1966/08/04 No data recorded  Encounter Date: 04/04/2021   PT End of Session - 04/04/21 1123     Visit Number 61    Number of Visits 70    Date for PT Re-Evaluation 06/27/21    Authorization Type [employer] 30appts jan 2022-jan 2023    Authorization - Visit Number 12    Authorization - Number of Visits 30    PT Start Time 1115    PT Stop Time 1155    PT Time Calculation (min) 40 min    Equipment Utilized During Treatment Gait belt    Activity Tolerance Patient tolerated treatment well    Behavior During Therapy WFL for tasks assessed/performed             Past Medical History:  Diagnosis Date   Oral cancer ([place])    Scleroderma ([place])     Past Surgical History:  Procedure Laterality Date   ABDOMINAL HYSTERECTOMY      There were no vitals filed for this visit.   Subjective Assessment - 04/04/21 1119     Subjective Pt reports she is doing well overall. She feels like her neck is tighter today than usual because it is "going to rain". Completing HEP without issue.    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 Shoulder pain started 3 months ago, no mechanism of injury, was getting better with heat. Reports she had pain for a about a month, and for the past 2 months the shoulder has been stiff. Has associated neck stiffness since her surgery last year . 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 Has trouble washing her hair, cannot tie her shoes d/t reaching and hand dysfunction, cannot sit up and  hold her head up without neck discomfort at her neck, cannot dress herself normally. No pain in shoulder/neck over the past week, describes discomfort/stiffness. 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    Limitations Sitting;Writing;House hold activities;Lifting    How long can you sit comfortably? 30    How long can you stand comfortably? unlimited    How long can you walk comfortably? unlimited    Diagnostic tests None on shoulder    Patient Stated Goals Increase shoulder strength and mobility    Pain Onset More than a month ago             THEREX - AA/ROM scapular protraction/retraction and thoracic spine rotation on NuStep; L3 17mins, seat7, SPM 60s, Arms level 9; cuing for cervical ext to neutral able to hold somewhat  - OMEGA chest press, slight overhead 20# 2x 8 with good carry over of cuing for proper technique Y on wall 1# DB 2x 10 with min cuing initially for proper technique with scapular retraction with good carry over - Squat with overhead press 1 5# DB 2 x8 with min cuing for R elbow ext with overhead  - Attempted seated bilat ER with RTB patient unable, modified to unilateal standing with band anchored  PT reviewed the following HEP with patient  with patient able to demonstrate a set of the following with min cuing for correction needed. PT educated patient on parameters of therex (how/when to inc/decrease intensity, frequency, rep/set range, stretch hold time, and purpose of therex) with verbalized understanding.  Seated Shoulder Flexion AAROM with Pulley Behind - 1-2 x daily - 7 x weekly - 12-20 reps Scaption with Dumbbells - 1 x daily - 2-3 x weekly - 3 sets - 10 reps Squat with Medicine [employer] - 1 x daily - 2-3 x weekly - 3 sets - 10 reps Low Trap Setting at [place] - 1 x daily - 2-3 x weekly - 3 sets - 10 reps Shoulder External Rotation with Anchored Resistance - 1 x daily - 2-3 x weekly - 3 sets - 10 reps     Manual Therapy  to reduce pain and  tissue tension, improve range of motion, neuromodulation, in order to promote improved ability to complete functional activities. - Passive cervical rotation and lateral bending to L , and ext off edge of mat table with support sustained holds (able to get upper cervical spine off mat table) - RUE flex with G3 mobilization 4 bouts 30sec                                      PT Education - 04/04/21 1121     Education Details therex form/technique    Person(s) Educated Patient    Methods Explanation;Demonstration;Verbal cues    Comprehension Verbalized understanding;Returned demonstration;Verbal cues required              PT Short Term Goals - 09/15/19 1119       PT SHORT TERM GOAL #1   Title Pt will be independent with HEP in order to improve strength and decrease pain in order to improve pain-free function at home and work.    Baseline 08/04/19 Completing HEP regularly withotu question or concern    Time 4    Period Weeks    Status Achieved               PT Long Term Goals - 04/04/21 1127       PT LONG TERM GOAL #1   Title Pt will decrease quick DASH score by at least 8% in order to demonstrate clinically significant reduction in disability.    Baseline 06/11/19 56.8%; 08/04/19 34%    Time 8    Period Weeks    Status Achieved      PT LONG TERM GOAL #2   Title Patient will demonstrate full cervical ROM in order to drive safely    Baseline ; 11/17/19 R/L rotation 35/25 Sidebending 33/31 ext -6d; ; 09/19/20 R/L rotation 35/30 Sidebending 38/46 ext 10d; 04/04/21 R/L rotation 50/28 Sidebending 27/38 ext 10d    Time 8    Period Weeks    Status On-going      PT LONG TERM GOAL #3   Title Patient will demonstrate full R shoulder flex A/PROM in order to complete overhead ADLs    Baseline 11/17/19 AROM flex 120d PROM flex 145; PROM/AROM of ER and IR WNL; 09/19/20 AROM flex 140d PROM flex 167; 04/04/21 AROM flex 142d PROM flex 165    Time 8    Period  Weeks    Status On-going      PT LONG TERM GOAL #4   Title Patient will demonstrate 4+/5 gross periscapular and shoulder  strength in order to complete heavy household ADLs and overhead ADLs    Baseline 11/17/19 R/L shoulder flex 3+/4+ abd 4-/5  ER 3+/4+  IR 4/5 T 3+/4+ Y 2+/3+; 09/19/20 R/L shoulder flex 4-/4+ abd 4/5  ER 4-/4+  IR 4+/5 T 4-/4+ Y 3+/4-; 04/04/21 R/L shoulder flex 4/5 abd 4/5  ER 4/4+  IR 5/5 T 4/4+ Y 4/4+    Time 8    Period Weeks    Status On-going                   Plan - 04/04/21 1159     Clinical Impression Statement PT continued therex progression for increased mobility and decreased pain with success. Patient is able to tolerate all progressions without pain, and with good carry over of all cuing for therex technique. On reassessment patient demonstrates at least maintenance of ROM and strength, with increased strength in all categories for pericapular motions. PT reviewed HEPP with patient for maintenance of gains made through therapy, with patient able to demonstrate and verbalize understanding. PT will continue progression as able.    Personal Factors and Comorbidities Age;Behavior Pattern;Comorbidity 1;Comorbidity 2;Past/Current Experience;Fitness;Sex;Time since onset of injury/illness/exacerbation    Comorbidities scleroderma, cancer    Examination-Activity Limitations Reach Overhead;Self Feeding;Dressing;Sleep;Lift    Examination-Participation Restrictions Laundry;Cleaning;Community Activity;Driving    Stability/Clinical Decision Making Evolving/Moderate complexity    Clinical Decision Making Moderate    Rehab Potential Good    PT Frequency 2x / week    PT Duration 8 weeks    PT Treatment/Interventions ADLs/Self Care Home Management;Aquatic Therapy;Moist Heat;Traction;Ultrasound;Cryotherapy;Parrafin;Functional mobility training;Neuromuscular re-education;Taping;Dry needling;Passive range of motion;Joint Manipulations;Spinal Manipulations;Patient/family  education;Therapeutic exercise;Manual techniques;Therapeutic activities;Electrical Stimulation;Fluidtherapy;Iontophoresis 4mg /ml Dexamethasone    PT Next Visit Plan Increase cervical ROM; postural restoration; REASSESS    Consulted and Agree with Plan of Care Patient             Patient will benefit from skilled therapeutic intervention in order to improve the following deficits and impairments:  Decreased endurance, Decreased mobility, Pain, Postural dysfunction, Impaired UE functional use, Impaired flexibility, Increased fascial restricitons, Decreased strength, Decreased activity tolerance, Decreased range of motion, Decreased scar mobility, Impaired tone, Improper body mechanics, Hypomobility  Visit Diagnosis: Muscle weakness (generalized)  Stiffness of right shoulder, not elsewhere classified  Cervicalgia     Problem List There are no problems to display for this patient.  Durwin Reges DPT Durwin Reges 04/04/2021, 12:03 PM  [facility] [address], [place], [address] Phone: [phone]   Fax:  [phone]  Name: Whitney Mclean MRN: [id number] Date of Birth: Jan 04, 1966

## 2021-04-18 ENCOUNTER — Encounter: Payer: Self-pay | Admitting: Physical Therapy

## 2021-04-18 ENCOUNTER — Ambulatory Visit: Payer: BC Managed Care – PPO | Admitting: Physical Therapy

## 2021-04-18 DIAGNOSIS — M6281 Muscle weakness (generalized): Secondary | ICD-10-CM

## 2021-04-18 NOTE — Therapy (Signed)
[facility] [address], [place], [address] Phone: [phone]   Fax:  [phone]  Physical Therapy Treatment  Patient Details  Name: Whitney Mclean MRN: [id number] Date of Birth: Feb 06, 1966 No data recorded  Encounter Date: 04/18/2021   PT End of Session - 04/18/21 1157     Visit Number 70    Number of Visits 40    Date for PT Re-Evaluation 06/27/21    Authorization Type [employer] 30appts jan 2022-jan 2023    Authorization - Visit Number 7    Authorization - Number of Visits 30    PT Start Time 1114    PT Stop Time 1154    PT Time Calculation (min) 40 min    Activity Tolerance Patient tolerated treatment well    Behavior During Therapy WFL for tasks assessed/performed             Past Medical History:  Diagnosis Date   Oral cancer ([place])    Scleroderma ([place])     Past Surgical History:  Procedure Laterality Date   ABDOMINAL HYSTERECTOMY      There were no vitals filed for this visit.   Subjective Assessment - 04/18/21 1115     Subjective Pt reports she is doing well other than her normal nerve pain. She reports the change in the weather/pressure stiffens her neck muscles.    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 Shoulder pain started 3 months ago, no mechanism of injury, was getting better with heat. Reports she had pain for a about a month, and for the past 2 months the shoulder has been stiff. Has associated neck stiffness since her surgery last year . 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 Has trouble washing her hair, cannot tie her shoes d/t reaching and hand dysfunction, cannot sit up and hold her head up without neck discomfort at her neck, cannot dress herself normally. No pain in shoulder/neck over  the past week, describes discomfort/stiffness. 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    Limitations Sitting;Writing;House hold activities;Lifting    How long can you sit comfortably? 30    How long can you stand comfortably? unlimited    How long can you walk comfortably? unlimited    Diagnostic tests None on shoulder    Patient Stated Goals Increase shoulder strength and mobility             Therapeutic Exercise  Nu Step L2 x 5 min to promote protraction/retraction   Supine Bench press with PVC+AW 1 x 6 reps with 5#, 2 x 10 reps 7.5# with cueing to move slowly   Lat Pull over with PVC+AW 2 x 10 reps #5 with cueing to bend elbows and control decent OH  STS with exercise ball OH press 2 x 10 reps  Horizontal ABD arm straight RTB 1x 10 reps YTB 1 x 10 reps with emphasis on scapular retraction  Reverse DB Shoulder Press 2# DB 2 x 6-7 reps cueing and PT assist to not to lean back   TRX Row + Squat 2 x 6 reps with min assist to stabilize and assist with squat   Upper Trap stretch bilateral 1 x 30 sec  PT Education - 04/18/21 1233     Education Details therex form/technique    Person(s) Educated Patient    Methods Explanation;Demonstration    Comprehension Verbalized understanding;Returned demonstration              PT Short Term Goals - 09/15/19 1119       PT SHORT TERM GOAL #1   Title Pt will be independent with HEP in order to improve strength and decrease pain in order to improve pain-free function at home and work.    Baseline 08/04/19 Completing HEP regularly withotu question or concern    Time 4    Period Weeks    Status Achieved               PT Long Term Goals - 04/04/21 1127       PT LONG TERM GOAL #1   Title Pt will decrease quick DASH score by at least 8% in order to demonstrate clinically significant reduction in disability.    Baseline 06/11/19 56.8%; 08/04/19 34%    Time 8    Period  Weeks    Status Achieved      PT LONG TERM GOAL #2   Title Patient will demonstrate full cervical ROM in order to drive safely    Baseline ; 11/17/19 R/L rotation 35/25 Sidebending 33/31 ext -6d; ; 09/19/20 R/L rotation 35/30 Sidebending 38/46 ext 10d; 04/04/21 R/L rotation 50/28 Sidebending 27/38 ext 10d    Time 8    Period Weeks    Status On-going      PT LONG TERM GOAL #3   Title Patient will demonstrate full R shoulder flex A/PROM in order to complete overhead ADLs    Baseline 11/17/19 AROM flex 120d PROM flex 145; PROM/AROM of ER and IR WNL; 09/19/20 AROM flex 140d PROM flex 167; 04/04/21 AROM flex 142d PROM flex 165    Time 8    Period Weeks    Status On-going      PT LONG TERM GOAL #4   Title Patient will demonstrate 4+/5 gross periscapular and shoulder strength in order to complete heavy household ADLs and overhead ADLs    Baseline 11/17/19 R/L shoulder flex 3+/4+ abd 4-/5  ER 3+/4+  IR 4/5 T 3+/4+ Y 2+/3+; 09/19/20 R/L shoulder flex 4-/4+ abd 4/5  ER 4-/4+  IR 4+/5 T 4-/4+ Y 3+/4-; 04/04/21 R/L shoulder flex 4/5 abd 4/5  ER 4/4+  IR 5/5 T 4/4+ Y 4/4+    Time 8    Period Weeks    Status On-going                   Plan - 04/18/21 1227     Clinical Impression Statement Patient tolerated session well with no reports of pain during/following therex progression. PT session consisted of upper and lower extremity strengthening and cervical mobility. Patient will continue to benefit from skilled PT and multimodal cueing to ensure safe and effective exercise progression.    Personal Factors and Comorbidities Age;Behavior Pattern;Comorbidity 1;Comorbidity 2;Past/Current Experience;Fitness;Sex;Time since onset of injury/illness/exacerbation    Examination-Activity Limitations Reach Overhead;Self Feeding;Dressing;Sleep;Lift    Examination-Participation Restrictions Laundry;Cleaning;Community Activity;Driving    Stability/Clinical Decision Making Evolving/Moderate complexity    Rehab  Potential Good    PT Frequency 2x / week    PT Duration 8 weeks    PT Treatment/Interventions ADLs/Self Care Home Management;Aquatic Therapy;Moist Heat;Traction;Ultrasound;Cryotherapy;Parrafin;Functional mobility training;Neuromuscular re-education;Taping;Dry needling;Passive range of motion;Joint Manipulations;Spinal Manipulations;Patient/family education;Therapeutic exercise;Manual techniques;Therapeutic activities;Electrical Stimulation;Fluidtherapy;Iontophoresis '4mg'$ /ml Dexamethasone  Consulted and Agree with Plan of Care Patient             Patient will benefit from skilled therapeutic intervention in order to improve the following deficits and impairments:  Decreased endurance, Decreased mobility, Pain, Postural dysfunction, Impaired UE functional use, Impaired flexibility, Increased fascial restricitons, Decreased strength, Decreased activity tolerance, Decreased range of motion, Decreased scar mobility, Impaired tone, Improper body mechanics, Hypomobility  Visit Diagnosis: Muscle weakness (generalized)     Problem List There are no problems to display for this patient.   Durwin Reges DPT Sharion Settler, SPT  Durwin Reges 04/18/2021, 3:42 PM  [facility] [address] [place], [address] Phone: [phone]   Fax:  [phone]  Name: LASHASTA CRUICKSHANK MRN: [id number] Date of Birth: [birth year]

## 2021-05-02 ENCOUNTER — Encounter: Payer: Self-pay | Admitting: Physical Therapy

## 2021-05-02 ENCOUNTER — Ambulatory Visit: Payer: BC Managed Care – PPO | Attending: Internal Medicine | Admitting: Physical Therapy

## 2021-05-02 DIAGNOSIS — M542 Cervicalgia: Secondary | ICD-10-CM | POA: Diagnosis present

## 2021-05-02 DIAGNOSIS — M25611 Stiffness of right shoulder, not elsewhere classified: Secondary | ICD-10-CM | POA: Insufficient documentation

## 2021-05-02 DIAGNOSIS — M6281 Muscle weakness (generalized): Secondary | ICD-10-CM | POA: Insufficient documentation

## 2021-05-02 NOTE — Therapy (Signed)
[facility] [address] [place], [address] Phone: [phone]   Fax:  [phone]  Physical Therapy Treatment  Patient Details  Name: Whitney Mclean MRN: [id number] Date of Birth: 1966-06-16 No data recorded  Encounter Date: 05/02/2021   PT End of Session - 05/02/21 1159     Visit Number 7    Number of Visits 70    Date for PT Re-Evaluation 06/27/21    Authorization Type [employer] 30appts jan 2022-jan 2023    Authorization - Visit Number 56    Authorization - Number of Visits 30    PT Start Time 1114    PT Stop Time 1153    PT Time Calculation (min) 39 min    Activity Tolerance Patient tolerated treatment well    Behavior During Therapy WFL for tasks assessed/performed             Past Medical History:  Diagnosis Date   Oral cancer ([place])    Scleroderma ([place])     Past Surgical History:  Procedure Laterality Date   ABDOMINAL HYSTERECTOMY      There were no vitals filed for this visit.   Subjective Assessment - 05/02/21 1119     Subjective Pt reports she was sore after last session for about 48 hours. She denies any pain and is motivated for therapy.    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 Shoulder pain started 3 months ago, no mechanism of injury, was getting better with heat. Reports she had pain for a about a month, and for the past 2 months the shoulder has been stiff. Has associated neck stiffness since her surgery last year . 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 Has trouble washing her hair, cannot tie her shoes d/t reaching and hand dysfunction, cannot sit up and hold her head up without neck discomfort at her neck, cannot dress herself normally. No pain in shoulder/neck over the past week, describes  discomfort/stiffness. 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    Limitations Sitting;Writing;House hold activities;Lifting    How long can you sit comfortably? 30    How long can you stand comfortably? unlimited    How long can you walk comfortably? unlimited    Diagnostic tests None on shoulder    Patient Stated Goals Increase shoulder strength and mobility              Therapeutic Exercise   Nu Step L2 x 5 min to promote protraction/retraction   Lat Pull over with PVC+AW combined Supine Bench press 2 x 10 reps #5 with cueing to bend elbows and control decent OH   STS with physioball OH press 2 x 8 reps cueing for feet placement    Horizontal ABD arm straight YTB 2 x 10 reps with emphasis on scapular retraction   Arnold DB Shoulder Press 1# DB 2 x 8 reps cueing and PT assist to not to lean back   TRX Low Row 2 x 6 reps with min assist to stabilize and assist with squat   Upper Trap stretch bilateral 1 x 30 sec  Manual Therapy  Supine Contract Relax Cervical Rotation bilat 3 x 5 sec holds  Supine  Cervical Side bending stretch 2 x 30  sec bilat                         PT Education - 05/02/21 1211     Education Details therex form/technique    Person(s) Educated Patient    Methods Explanation;Demonstration    Comprehension Verbalized understanding;Returned demonstration              PT Short Term Goals - 09/15/19 1119       PT SHORT TERM GOAL #1   Title Pt will be independent with HEP in order to improve strength and decrease pain in order to improve pain-free function at home and work.    Baseline 08/04/19 Completing HEP regularly withotu question or concern    Time 4    Period Weeks    Status Achieved               PT Long Term Goals - 04/04/21 1127       PT LONG TERM GOAL #1   Title Pt will decrease quick DASH score by at least 8% in order to demonstrate clinically significant reduction in disability.     Baseline 06/11/19 56.8%; 08/04/19 34%    Time 8    Period Weeks    Status Achieved      PT LONG TERM GOAL #2   Title Patient will demonstrate full cervical ROM in order to drive safely    Baseline ; 11/17/19 R/L rotation 35/25 Sidebending 33/31 ext -6d; ; 09/19/20 R/L rotation 35/30 Sidebending 38/46 ext 10d; 04/04/21 R/L rotation 50/28 Sidebending 27/38 ext 10d    Time 8    Period Weeks    Status On-going      PT LONG TERM GOAL #3   Title Patient will demonstrate full R shoulder flex A/PROM in order to complete overhead ADLs    Baseline 11/17/19 AROM flex 120d PROM flex 145; PROM/AROM of ER and IR WNL; 09/19/20 AROM flex 140d PROM flex 167; 04/04/21 AROM flex 142d PROM flex 165    Time 8    Period Weeks    Status On-going      PT LONG TERM GOAL #4   Title Patient will demonstrate 4+/5 gross periscapular and shoulder strength in order to complete heavy household ADLs and overhead ADLs    Baseline 11/17/19 R/L shoulder flex 3+/4+ abd 4-/5  ER 3+/4+  IR 4/5 T 3+/4+ Y 2+/3+; 09/19/20 R/L shoulder flex 4-/4+ abd 4/5  ER 4-/4+  IR 4+/5 T 4-/4+ Y 3+/4-; 04/04/21 R/L shoulder flex 4/5 abd 4/5  ER 4/4+  IR 5/5 T 4/4+ Y 4/4+    Time 8    Period Weeks    Status On-going                   Plan - 05/02/21 1159     Clinical Impression Statement PT progressed UE and cervical mobility and strengthening this session. Pt responded well to therex and manual therapy with no reports of pain or discomfort. Patient will continue to benefit from skilled therapy for exercise performance to ensure proper technique for safe and effective progression.    Personal Factors and Comorbidities Age;Behavior Pattern;Comorbidity 1;Comorbidity 2;Past/Current Experience;Fitness;Sex;Time since onset of injury/illness/exacerbation    Comorbidities scleroderma, cancer    Examination-Activity Limitations Reach Overhead;Self Feeding;Dressing;Sleep;Lift    Examination-Participation Restrictions Laundry;Cleaning;Community  Activity;Driving    Stability/Clinical Decision Making Evolving/Moderate complexity    Clinical Decision Making Moderate    Rehab Potential Good  PT Frequency 2x / week    PT Duration 8 weeks    PT Treatment/Interventions ADLs/Self Care Home Management;Aquatic Therapy;Moist Heat;Traction;Ultrasound;Cryotherapy;Parrafin;Functional mobility training;Neuromuscular re-education;Taping;Dry needling;Passive range of motion;Joint Manipulations;Spinal Manipulations;Patient/family education;Therapeutic exercise;Manual techniques;Therapeutic activities;Electrical Stimulation;Fluidtherapy;Iontophoresis '4mg'$ /ml Dexamethasone    PT Next Visit Plan Increase cervical ROM; postural restoration; REASSESS    Consulted and Agree with Plan of Care Patient             Patient will benefit from skilled therapeutic intervention in order to improve the following deficits and impairments:  Decreased endurance, Decreased mobility, Pain, Postural dysfunction, Impaired UE functional use, Impaired flexibility, Increased fascial restricitons, Decreased strength, Decreased activity tolerance, Decreased range of motion, Decreased scar mobility, Impaired tone, Improper body mechanics, Hypomobility  Visit Diagnosis: Muscle weakness (generalized)  Stiffness of right shoulder, not elsewhere classified  Cervicalgia     Problem List There are no problems to display for this patient.   Durwin Reges DPT Sharion Settler, SPT  Durwin Reges 05/03/2021, 8:15 AM  [facility] [address] [place], [address] Phone: [phone]   Fax:  [phone]  Name: JOSIEPHINE MIRKIN MRN: [id number] Date of Birth: 22-Dec-1965

## 2021-05-16 ENCOUNTER — Ambulatory Visit: Payer: BC Managed Care – PPO | Admitting: Physical Therapy

## 2021-05-16 ENCOUNTER — Encounter: Payer: Self-pay | Admitting: Physical Therapy

## 2021-05-16 DIAGNOSIS — M25611 Stiffness of right shoulder, not elsewhere classified: Secondary | ICD-10-CM

## 2021-05-16 DIAGNOSIS — M6281 Muscle weakness (generalized): Secondary | ICD-10-CM

## 2021-05-16 NOTE — Therapy (Signed)
[facility] [address] [place], [address] Phone: [phone]   Fax:  [phone]  Physical Therapy Treatment  Patient Details  Name: Whitney Mclean MRN: [id number] Date of Birth: 01-22-1966 No data recorded  Encounter Date: 05/16/2021   PT End of Session - 05/16/21 1206     Visit Number 18    Number of Visits 50    Date for PT Re-Evaluation 06/27/21    Authorization Type [employer] 30appts jan 2022-jan 2023    Authorization - Visit Number 43    Authorization - Number of Visits 30    PT Start Time 1115    PT Stop Time 1153    PT Time Calculation (min) 38 min    Activity Tolerance Patient tolerated treatment well    Behavior During Therapy [facility] for tasks assessed/performed             Past Medical History:  Diagnosis Date   Oral cancer ([place])    Scleroderma ([place])     Past Surgical History:  Procedure Laterality Date   ABDOMINAL HYSTERECTOMY      There were no vitals filed for this visit.   Subjective Assessment - 05/16/21 1115     Subjective Pt reports neck is a little stiff today but it continues to feel more mobile/flexible than it used to.    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 Shoulder pain started 3 months ago, no mechanism of injury, was getting better with heat. Reports she had pain for a about a month, and for the past 2 months the shoulder has been stiff. Has associated neck stiffness since her surgery last year . 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 Has trouble washing her hair, cannot tie her shoes d/t reaching and hand dysfunction, cannot sit up and hold her head up without neck discomfort at her neck, cannot dress herself normally. No pain in shoulder/neck over the past week, describes  discomfort/stiffness. 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    Limitations Sitting;Writing;House hold activities;Lifting    How long can you sit comfortably? 30    How long can you stand comfortably? unlimited    How long can you walk comfortably? unlimited    Diagnostic tests None on shoulder    Patient Stated Goals Increase shoulder strength and mobility              Therapeutic Exercise   Nu Step L2 x 5 min to promote protraction/retraction   Lat Pull over with PVC+AW combined Supine Bench press 2 x 10 reps #5 with cueing to bend elbows and control decent OH  Overhead wall slides with foam roller  2 x 8 reps emphasis on protraction    STS with physioball OH press 2 x 8 reps cueing for feet placement    Horizontal ABD arm straight YTB 2 x 10 reps with emphasis on scapular retraction   Standing Row 2 x 8-10 reps with YTB with emphasis on scapular retraction    Manual Therapy to reduce tissue tension, improve range of motion, neuromodulation, in order to promote improved ability to complete functional activities  Passive Supine Upper Trap stretch bilateral 1 x 30 sec bilat   Passive Supine Cervical Side bending stretch 2  x 30 sec bilat  Cervical Distraction 2 x 60 sec  3 bouts 30 sec Grade 3 C4-C6 lateral mobilizations                          PT Education - 05/16/21 1207     Education Details therex form/technique    Person(s) Educated Patient    Methods Explanation;Demonstration    Comprehension Verbalized understanding;Returned demonstration              PT Short Term Goals - 09/15/19 1119       PT SHORT TERM GOAL #1   Title Pt will be independent with HEP in order to improve strength and decrease pain in order to improve pain-free function at home and work.    Baseline 08/04/19 Completing HEP regularly withotu question or concern    Time 4    Period Weeks    Status Achieved               PT Long Term Goals -  04/04/21 1127       PT LONG TERM GOAL #1   Title Pt will decrease quick DASH score by at least 8% in order to demonstrate clinically significant reduction in disability.    Baseline 06/11/19 56.8%; 08/04/19 34%    Time 8    Period Weeks    Status Achieved      PT LONG TERM GOAL #2   Title Patient will demonstrate full cervical ROM in order to drive safely    Baseline ; 11/17/19 R/L rotation 35/25 Sidebending 33/31 ext -6d; ; 09/19/20 R/L rotation 35/30 Sidebending 38/46 ext 10d; 04/04/21 R/L rotation 50/28 Sidebending 27/38 ext 10d    Time 8    Period Weeks    Status On-going      PT LONG TERM GOAL #3   Title Patient will demonstrate full R shoulder flex A/PROM in order to complete overhead ADLs    Baseline 11/17/19 AROM flex 120d PROM flex 145; PROM/AROM of ER and IR WNL; 09/19/20 AROM flex 140d PROM flex 167; 04/04/21 AROM flex 142d PROM flex 165    Time 8    Period Weeks    Status On-going      PT LONG TERM GOAL #4   Title Patient will demonstrate 4+/5 gross periscapular and shoulder strength in order to complete heavy household ADLs and overhead ADLs    Baseline 11/17/19 R/L shoulder flex 3+/4+ abd 4-/5  ER 3+/4+  IR 4/5 T 3+/4+ Y 2+/3+; 09/19/20 R/L shoulder flex 4-/4+ abd 4/5  ER 4-/4+  IR 4+/5 T 4-/4+ Y 3+/4-; 04/04/21 R/L shoulder flex 4/5 abd 4/5  ER 4/4+  IR 5/5 T 4/4+ Y 4/4+    Time 8    Period Weeks    Status On-going                   Plan - 05/16/21 1209     Clinical Impression Statement PT focused on UE strengthening and cervical mobility this session. Pt responded well to manual therapy and exercise with no reports of pain or discomfort. Pt will continue to benefit from skilled therapy to promote functional mobility and ensure safe exercise progression.    Personal Factors and Comorbidities Age;Behavior Pattern;Comorbidity 1;Comorbidity 2;Past/Current Experience;Fitness;Sex;Time since onset of injury/illness/exacerbation    Comorbidities scleroderma, cancer     Examination-Activity Limitations Reach Overhead;Self Feeding;Dressing;Sleep;Lift    Examination-Participation Restrictions Laundry;Cleaning;Community Activity;Driving    Stability/Clinical Decision Making Evolving/Moderate  complexity    Rehab Potential Good    PT Frequency 2x / week    PT Duration 8 weeks    PT Treatment/Interventions ADLs/Self Care Home Management;Aquatic Therapy;Moist Heat;Traction;Ultrasound;Cryotherapy;Parrafin;Functional mobility training;Neuromuscular re-education;Taping;Dry needling;Passive range of motion;Joint Manipulations;Spinal Manipulations;Patient/family education;Therapeutic exercise;Manual techniques;Therapeutic activities;Electrical Stimulation;Fluidtherapy;Iontophoresis '4mg'$ /ml Dexamethasone    PT Next Visit Plan Increase cervical ROM; postural restoration; REASSESS    Consulted and Agree with Plan of Care Patient             Patient will benefit from skilled therapeutic intervention in order to improve the following deficits and impairments:  Decreased endurance, Decreased mobility, Pain, Postural dysfunction, Impaired UE functional use, Impaired flexibility, Increased fascial restricitons, Decreased strength, Decreased activity tolerance, Decreased range of motion, Decreased scar mobility, Impaired tone, Improper body mechanics, Hypomobility  Visit Diagnosis: Muscle weakness (generalized)  Stiffness of right shoulder, not elsewhere classified     Problem List There are no problems to display for this patient.   Durwin Reges DPT Sharion Settler, SPT  Durwin Reges 05/16/2021, 3:54 PM  [facility] [address], [place], [address] Phone: [phone]   Fax:  [phone]  Name: TALAYSHIA URSO MRN: [id number] Date of Birth: 09-28-1965

## 2021-05-30 ENCOUNTER — Ambulatory Visit: Payer: BC Managed Care – PPO | Admitting: Physical Therapy

## 2021-06-06 ENCOUNTER — Ambulatory Visit: Payer: BC Managed Care – PPO | Admitting: Physical Therapy

## 2021-06-13 ENCOUNTER — Encounter: Payer: BC Managed Care – PPO | Admitting: Physical Therapy

## 2021-06-27 ENCOUNTER — Encounter: Payer: BC Managed Care – PPO | Admitting: Physical Therapy

## 2021-07-11 ENCOUNTER — Encounter: Payer: BC Managed Care – PPO | Admitting: Physical Therapy

## 2021-07-25 ENCOUNTER — Encounter: Payer: BC Managed Care – PPO | Admitting: Physical Therapy

## 2021-07-26 ENCOUNTER — Encounter: Payer: Self-pay | Admitting: Physical Therapy

## 2021-07-26 ENCOUNTER — Ambulatory Visit: Payer: BC Managed Care – PPO | Attending: Internal Medicine | Admitting: Physical Therapy

## 2021-07-26 DIAGNOSIS — M6281 Muscle weakness (generalized): Secondary | ICD-10-CM | POA: Diagnosis present

## 2021-07-26 DIAGNOSIS — M25611 Stiffness of right shoulder, not elsewhere classified: Secondary | ICD-10-CM | POA: Diagnosis present

## 2021-07-26 NOTE — Addendum Note (Signed)
Addended by: Kelton Pillar on: 07/26/2021 04:11 PM   Modules accepted: Orders

## 2021-07-26 NOTE — Therapy (Addendum)
[facility] [address] [place], [address] Phone: [phone]   Fax:  [phone]  Physical Therapy Treatment ReEval/Progress Note Reporting Period 04/04/21-07/26/21  Patient Details  Name: Whitney Mclean MRN: [id number] Date of Birth: [birth year] No data recorded  Encounter Date: 07/26/2021   PT End of Session - 07/26/21 1029     Visit Number 9    Number of Visits 65    Date for PT Re-Evaluation 06/27/21    Authorization Type [employer] 30appts jan 2022-jan 2023    Authorization - Visit Number 35    Authorization - Number of Visits 30    PT Start Time 0944    PT Stop Time 1023    PT Time Calculation (min) 39 min    Equipment Utilized During Treatment --    Activity Tolerance Patient tolerated treatment well    Behavior During Therapy WFL for tasks assessed/performed             Past Medical History:  Diagnosis Date   Oral cancer ([place])    Scleroderma ([place])     Past Surgical History:  Procedure Laterality Date   ABDOMINAL HYSTERECTOMY      There were no vitals filed for this visit.   Subjective Assessment - 07/26/21 0947     Subjective Pt reports normal nerve pain. She reports continued participation of HEP to maintain mobility and strength. She returns to therapy after taking prolonged time off d/t insurance lapse.    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 Shoulder pain started 3 months ago, no mechanism of injury, was getting better with heat. Reports she had pain for a about a month, and for the past 2 months the shoulder has been stiff. Has associated neck stiffness since her surgery last year . 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 Has trouble washing her hair, cannot tie her shoes d/t reaching and hand  dysfunction, cannot sit up and hold her head up without neck discomfort at her neck, cannot dress herself normally. No pain in shoulder/neck over the past week, describes discomfort/stiffness. 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    Limitations Sitting;Writing;House hold activities;Lifting    How long can you sit comfortably? 30    How long can you stand comfortably? unlimited    How long can you walk comfortably? unlimited    Diagnostic tests None on shoulder    Patient Stated Goals Increase shoulder strength and mobility    Pain Onset More than a month ago            Therex  TRX AAROM Flex 1 x 12 reps with 5 sec  TRX AAROM ABD 1 x 12 reps with 5 sec  Review of current HEP with verbalized understanding of parameters  Reassessment: Cervical AROM Rotation: L 30 deg R 40 deg Sidebending L 27 R 25  R Shoulder Flex AROM/PROM AROM flex 125d PROM flex 142d  Shoulder MMT R/L shoulder flex 4-/4, abd 4-/4,  ER 4-/4-,  IR 5/5   Periscapular MMT Y,T T4-/4 Y 4-/4  Manual Therapy  Cervical Distraction 3 bouts x 30 sec   Distraction with rotation and G3 mob x12 to each side  Cervical side glide G3 mob with movement x12 to  each side  Cervical Rotation 2 x 30 sec R&L  *manual therapy performed in supine for increased tissue extensibility                 PT Education - 07/26/21 1205     Education Details pt educated on progress to date.    Person(s) Educated Patient    Methods Explanation    Comprehension Verbalized understanding              PT Short Term Goals - 09/15/19 1119       PT SHORT TERM GOAL #1   Title Pt will be independent with HEP in order to improve strength and decrease pain in order to improve pain-free function at home and work.    Baseline 08/04/19 Completing HEP regularly withotu question or concern    Time 4    Period Weeks    Status Achieved               PT Long Term Goals - 07/26/21 0948       PT LONG  TERM GOAL #1   Title Pt will decrease quick DASH score by at least 8% in order to demonstrate clinically significant reduction in disability.    Baseline 06/11/19 56.8%; 08/04/19 34%    Time 8    Period Weeks    Status Deferred      PT LONG TERM GOAL #2   Title Patient will demonstrate full cervical ROM in order to drive safely    Baseline ; 11/17/19 R/L rotation 35/25 Sidebending 33/31 ext -6d; ; 09/19/20 R/L rotation 35/30 Sidebending 38/46 ext 10d; 04/04/21 R/L rotation 50/28 Sidebending 27/38 ext 10d;07/26/21 Rotation: L 30 deg R 40 deg Sidebending L 27 R 25    Time 8    Period Days    Status On-going      PT LONG TERM GOAL #3   Title Patient will demonstrate full R shoulder flex A/PROM in order to complete overhead ADLs    Baseline 11/17/19 AROM flex 120d PROM flex 145; PROM/AROM of ER and IR WNL; 09/19/20 AROM flex 140d PROM flex 167; 04/04/21 AROM flex 142d PROM flex 165; 07/26/21: AROM flex 125d PROM flex 142d;    Time 8    Period Weeks    Status On-going      PT LONG TERM GOAL #4   Title Patient will demonstrate 4+/5 gross periscapular and shoulder strength in order to complete heavy household ADLs and overhead ADLs    Baseline 11/17/19 R/L shoulder flex 3+/4+ abd 4-/5  ER 3+/4+  IR 4/5 T 3+/4+ Y 2+/3+; 09/19/20 R/L shoulder flex 4-/4+ abd 4/5  ER 4-/4+  IR 4+/5 T 4-/4+ Y 3+/4-; 04/04/21 R/L shoulder flex 4/5 abd 4/5  ER 4/4+  IR 5/5 T 4/4+ Y 4/4+: 07/26/21: R/L shoulder flex 4-/4 abd 4-/4 ER 4-/4-  IR 5/5  T4-/4 Y 4-/4    Time 8    Period Weeks    Status On-going                   Plan - 07/26/21 1032     Clinical Impression Statement Progress note performed this date after patient's return to PT. Pt demonstrated decreased cervical AROM, decreased R shoulder P/AROM, and decreased bilateral shoulder strength following discontinuing PT. Pt tolerated session well will no reports of increased pain following manual therapy and exerise. PT continued cervical and shoulder mobility  this session with success. Pt was able to perform therex  with minimal cueing and good motivation. PT reviewed HEP with patient with patient able to verablize and demonstrate understanding. Pt will continue to benefit from skilled PT to address impairments, and clearly benefits from PT as evidenced by regression of goals with absence. Continue PT POC.    Personal Factors and Comorbidities Age;Behavior Pattern;Comorbidity 1;Comorbidity 2;Past/Current Experience;Fitness;Sex;Time since onset of injury/illness/exacerbation    Comorbidities scleroderma, cancer    Examination-Activity Limitations Reach Overhead;Self Feeding;Dressing;Sleep;Lift    Examination-Participation Restrictions Laundry;Cleaning;Community Activity;Driving    Stability/Clinical Decision Making Evolving/Moderate complexity    Clinical Decision Making Moderate    Rehab Potential Good    PT Frequency 2x / week    PT Duration 8 weeks    PT Treatment/Interventions ADLs/Self Care Home Management;Aquatic Therapy;Moist Heat;Traction;Ultrasound;Cryotherapy;Parrafin;Functional mobility training;Neuromuscular re-education;Taping;Dry needling;Passive range of motion;Joint Manipulations;Spinal Manipulations;Patient/family education;Therapeutic exercise;Manual techniques;Therapeutic activities;Electrical Stimulation;Fluidtherapy;Iontophoresis 4mg /ml Dexamethasone    PT Next Visit Plan Increase cervical ROM; postural restoration;    Consulted and Agree with Plan of Care Patient             Patient will benefit from skilled therapeutic intervention in order to improve the following deficits and impairments:  Decreased endurance, Decreased mobility, Pain, Postural dysfunction, Impaired UE functional use, Impaired flexibility, Increased fascial restricitons, Decreased strength, Decreased activity tolerance, Decreased range of motion, Decreased scar mobility, Impaired tone, Improper body mechanics, Hypomobility  Visit Diagnosis: Muscle weakness  (generalized)     Problem List There are no problems to display for this patient.    Durwin Reges DPT Sharion Settler, SPT  Durwin Reges, PT 07/26/2021, 4:06 PM  [facility] [address] [place], [address] Phone: [phone]   Fax:  [phone]  Name: Whitney Mclean MRN: [id number] Date of Birth: 1965-10-08

## 2021-08-07 ENCOUNTER — Encounter: Payer: Self-pay | Admitting: Physical Therapy

## 2021-08-07 ENCOUNTER — Ambulatory Visit: Payer: BC Managed Care – PPO | Admitting: Physical Therapy

## 2021-08-07 DIAGNOSIS — M6281 Muscle weakness (generalized): Secondary | ICD-10-CM

## 2021-08-07 DIAGNOSIS — M25611 Stiffness of right shoulder, not elsewhere classified: Secondary | ICD-10-CM

## 2021-08-07 NOTE — Therapy (Signed)
[facility] [address] [place], [address] Phone: [phone]   Fax:  [phone]  Physical Therapy Treatment  Patient Details  Name: Whitney Mclean MRN: [id number] Date of Birth: 02-22-1966 No data recorded  Encounter Date: 08/07/2021   PT End of Session - 08/07/21 1123     Visit Number 2    Number of Visits 70    Date for PT Re-Evaluation 10/18/21    Authorization Type [employer] 30appts jan 2022-jan 2023    Authorization - Visit Number 5    Authorization - Number of Visits 30    PT Start Time 1118    PT Stop Time 1158    PT Time Calculation (min) 40 min    Equipment Utilized During Treatment --    Activity Tolerance Patient tolerated treatment well    Behavior During Therapy WFL for tasks assessed/performed             Past Medical History:  Diagnosis Date   Oral cancer ([place])    Scleroderma ([place])     Past Surgical History:  Procedure Laterality Date   ABDOMINAL HYSTERECTOMY      There were no vitals filed for this visit.   Subjective Assessment - 08/07/21 1126     Subjective Pt reports feeling well and is motivated to continue PT. She reports having an infusion for scleroderm. She reports no adverse side effects.    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 Shoulder pain started 3 months ago, no mechanism of injury, was getting better with heat. Reports she had pain for a about a month, and for the past 2 months the shoulder has been stiff. Has associated neck stiffness since her surgery last year . 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 Has trouble washing her hair, cannot tie her shoes d/t reaching and hand dysfunction, cannot sit up and hold her head up without neck discomfort at her neck, cannot dress  herself normally. No pain in shoulder/neck over the past week, describes discomfort/stiffness. 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    Limitations Sitting;Writing;House hold activities;Lifting    How long can you sit comfortably? 30    How long can you stand comfortably? unlimited    How long can you walk comfortably? unlimited              Therapeutic Exercise   Nu Step L2 x 5 min to promote protraction/retraction Seat 5 UE 12  Horizontal ABD arm straight YTB 2 x 6 reps with emphasis on scapular retraction  Overhead wall slides with foam roller  2 x 6 reps emphasis on protraction    STS with physioball OH press 2 x 6 reps cueing for feet placement    TRX AAROM Flex 3 x 30 sec R UE TRX AAROM ABD 3 x 30 sec R UE with cueing for alignment  Manual Therapy  Distraction with rotation and G3 mob x12 to each side   Cervical side glide G3 mob with movement x12 to each side C3-C6    Cervical Rotation 2 x 30 sec bilat  Upper trap stretch 2 x 30 sec bilat   *manual therapy performed in supine to reduce tissue tension, improve range of motion, neuromodulation, in order  to promote improved ability to complete functional activities                   PT Education - 08/07/21 1159     Education Details Pt educated on therex form/technique    Person(s) Educated Patient    Methods Explanation;Demonstration    Comprehension Verbalized understanding;Returned demonstration              PT Short Term Goals - 09/15/19 1119       PT SHORT TERM GOAL #1   Title Pt will be independent with HEP in order to improve strength and decrease pain in order to improve pain-free function at home and work.    Baseline 08/04/19 Completing HEP regularly withotu question or concern    Time 4    Period Weeks    Status Achieved               PT Long Term Goals - 07/26/21 0948       PT LONG TERM GOAL #1   Title Pt will decrease quick DASH score by at least 8%  in order to demonstrate clinically significant reduction in disability.    Baseline 06/11/19 56.8%; 08/04/19 34%    Time 8    Period Weeks    Status Deferred      PT LONG TERM GOAL #2   Title Patient will demonstrate full cervical ROM in order to drive safely    Baseline ; 11/17/19 R/L rotation 35/25 Sidebending 33/31 ext -6d; ; 09/19/20 R/L rotation 35/30 Sidebending 38/46 ext 10d; 04/04/21 R/L rotation 50/28 Sidebending 27/38 ext 10d;07/26/21 Rotation: L 30 deg R 40 deg Sidebending L 27 R 25    Time 8    Period Days    Status On-going      PT LONG TERM GOAL #3   Title Patient will demonstrate full R shoulder flex A/PROM in order to complete overhead ADLs    Baseline 11/17/19 AROM flex 120d PROM flex 145; PROM/AROM of ER and IR WNL; 09/19/20 AROM flex 140d PROM flex 167; 04/04/21 AROM flex 142d PROM flex 165; 07/26/21: AROM flex 125d PROM flex 142d;    Time 8    Period Weeks    Status On-going      PT LONG TERM GOAL #4   Title Patient will demonstrate 4+/5 gross periscapular and shoulder strength in order to complete heavy household ADLs and overhead ADLs    Baseline 11/17/19 R/L shoulder flex 3+/4+ abd 4-/5  ER 3+/4+  IR 4/5 T 3+/4+ Y 2+/3+; 09/19/20 R/L shoulder flex 4-/4+ abd 4/5  ER 4-/4+  IR 4+/5 T 4-/4+ Y 3+/4-; 04/04/21 R/L shoulder flex 4/5 abd 4/5  ER 4/4+  IR 5/5 T 4/4+ Y 4/4+: 07/26/21: R/L shoulder flex 4-/4 abd 4-/4 ER 4-/4-  IR 5/5  T4-/4 Y 4-/4    Time 8    Period Weeks    Status On-going                   Plan - 08/07/21 1218     Clinical Impression Statement Pt responded well to manual therapy and exercise with no reports of pain or discomfort throughout. PT focused on UE strengthening and cervical mobility this session with success. Pt was able to correct form with all verbal and tactile cueing. Pt will continue to benefit from skilled therapy to promote functional mobility and ensure safe exercise progression. Continue PT POC as able.    Personal Factors and  Comorbidities Age;Behavior Pattern;Comorbidity 1;Comorbidity 2;Past/Current Experience;Fitness;Sex;Time since onset of injury/illness/exacerbation    Comorbidities scleroderma, cancer    Examination-Activity Limitations Reach Overhead;Self Feeding;Dressing;Sleep;Lift    Examination-Participation Restrictions Laundry;Cleaning;Community Activity;Driving    Stability/Clinical Decision Making Evolving/Moderate complexity    Clinical Decision Making Moderate    Rehab Potential Good    PT Frequency 2x / week    PT Duration 8 weeks    PT Treatment/Interventions ADLs/Self Care Home Management;Aquatic Therapy;Moist Heat;Traction;Ultrasound;Cryotherapy;Parrafin;Functional mobility training;Neuromuscular re-education;Taping;Dry needling;Passive range of motion;Joint Manipulations;Spinal Manipulations;Patient/family education;Therapeutic exercise;Manual techniques;Therapeutic activities;Electrical Stimulation;Fluidtherapy;Iontophoresis 4mg /ml Dexamethasone    PT Next Visit Plan Increase cervical ROM; postural restoration;    Consulted and Agree with Plan of Care Patient             Patient will benefit from skilled therapeutic intervention in order to improve the following deficits and impairments:  Decreased endurance, Decreased mobility, Pain, Postural dysfunction, Impaired UE functional use, Impaired flexibility, Increased fascial restricitons, Decreased strength, Decreased activity tolerance, Decreased range of motion, Decreased scar mobility, Impaired tone, Improper body mechanics, Hypomobility  Visit Diagnosis: Muscle weakness (generalized)  Stiffness of right shoulder, not elsewhere classified     Problem List There are no problems to display for this patient.    Durwin Reges DPT Sharion Settler, SPT  Durwin Reges, PT 08/08/2021, 11:11 AM  [facility] [address] [place], [address] Phone: [phone]    Fax:  [phone]  Name: Whitney Mclean MRN: [id number] Date of Birth: Oct 11, 1965

## 2021-08-08 ENCOUNTER — Encounter: Payer: BC Managed Care – PPO | Admitting: Physical Therapy

## 2021-08-22 ENCOUNTER — Encounter: Payer: Self-pay | Admitting: Physical Therapy

## 2021-08-22 ENCOUNTER — Ambulatory Visit: Payer: BC Managed Care – PPO | Admitting: Physical Therapy

## 2021-08-22 ENCOUNTER — Encounter: Payer: BC Managed Care – PPO | Admitting: Physical Therapy

## 2021-08-22 DIAGNOSIS — M6281 Muscle weakness (generalized): Secondary | ICD-10-CM | POA: Diagnosis not present

## 2021-08-22 NOTE — Therapy (Signed)
[facility] [address], [place], [address] Phone: [phone]   Fax:  [phone]  Physical Therapy Treatment  Patient Details  Name: Whitney Mclean MRN: [id number] Date of Birth: 1965-12-11 No data recorded  Encounter Date: 08/22/2021   PT End of Session - 08/22/21 1131     Visit Number 3    Number of Visits 70    Date for PT Re-Evaluation 10/18/21    Authorization Type [employer] 30appts jan 2022-jan 2023    Authorization - Visit Number 44    Authorization - Number of Visits 30    PT Start Time 1116    PT Stop Time 1155    PT Time Calculation (min) 39 min    Activity Tolerance Patient tolerated treatment well    Behavior During Therapy WFL for tasks assessed/performed             Past Medical History:  Diagnosis Date   Oral cancer ([place])    Scleroderma ([place])     Past Surgical History:  Procedure Laterality Date   ABDOMINAL HYSTERECTOMY      There were no vitals filed for this visit.   Subjective Assessment - 08/22/21 1125     Subjective Patient is cotinuing to do better since starting back at PT. She is completing HEP as well. 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 Shoulder pain started 3 months ago, no mechanism of injury, was getting better with heat. Reports she had pain for a about a month, and for the past 2 months the shoulder has been stiff. Has associated neck stiffness since her surgery last year . 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 Has trouble washing her hair, cannot tie her shoes d/t reaching and hand dysfunction, cannot sit up and hold her head up without neck discomfort at her neck, cannot dress herself normally. No pain in shoulder/neck over the past  week, describes discomfort/stiffness. 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    Limitations Sitting;Writing;House hold activities;Lifting    How long can you sit comfortably? 30    How long can you stand comfortably? unlimited    How long can you walk comfortably? unlimited    Diagnostic tests None on shoulder    Patient Stated Goals Increase shoulder strength and mobility    Pain Onset More than a month ago             Therapeutic Exercise   Nu Step L2 x 5 min to promote protraction/retraction Seat 5 UE 12   Lat pulldown 20# x10;  25# x6  with min cuing for eccentric control with good carry over  Y from wall x10; with 1# DB 2x 10 with min cuing for core activation without thoracic/lumbar ext compensation with good carry over  STS with overhead press 2# DB bilat 3x 8/7/6  Horizontal ABD seated 1# 2x 8 with min cuing to prevent protraction       Manual Therapy  to reduce pain and tissue tension, improve range of motion, neuromodulation, in order to promote improved ability to complete functional activities. - Passive cervical rotation and lateral bending to L , and ext off edge of mat table with  support sustained holds (able to get upper cervical spine off mat table) STM to R side of ant neck (hardened area)                           PT Education - 08/22/21 1131     Education Details therex form/technique    Person(s) Educated Patient    Methods Explanation;Demonstration;Verbal cues    Comprehension Verbalized understanding;Verbal cues required;Returned demonstration              PT Short Term Goals - 09/15/19 1119       PT SHORT TERM GOAL #1   Title Pt will be independent with HEP in order to improve strength and decrease pain in order to improve pain-free function at home and work.    Baseline 08/04/19 Completing HEP regularly withotu question or concern    Time 4    Period Weeks    Status Achieved               PT  Long Term Goals - 07/26/21 0948       PT LONG TERM GOAL #1   Title Pt will decrease quick DASH score by at least 8% in order to demonstrate clinically significant reduction in disability.    Baseline 06/11/19 56.8%; 08/04/19 34%    Time 8    Period Weeks    Status Deferred      PT LONG TERM GOAL #2   Title Patient will demonstrate full cervical ROM in order to drive safely    Baseline ; 11/17/19 R/L rotation 35/25 Sidebending 33/31 ext -6d; ; 09/19/20 R/L rotation 35/30 Sidebending 38/46 ext 10d; 04/04/21 R/L rotation 50/28 Sidebending 27/38 ext 10d;07/26/21 Rotation: L 30 deg R 40 deg Sidebending L 27 R 25    Time 8    Period Days    Status On-going      PT LONG TERM GOAL #3   Title Patient will demonstrate full R shoulder flex A/PROM in order to complete overhead ADLs    Baseline 11/17/19 AROM flex 120d PROM flex 145; PROM/AROM of ER and IR WNL; 09/19/20 AROM flex 140d PROM flex 167; 04/04/21 AROM flex 142d PROM flex 165; 07/26/21: AROM flex 125d PROM flex 142d;    Time 8    Period Weeks    Status On-going      PT LONG TERM GOAL #4   Title Patient will demonstrate 4+/5 gross periscapular and shoulder strength in order to complete heavy household ADLs and overhead ADLs    Baseline 11/17/19 R/L shoulder flex 3+/4+ abd 4-/5  ER 3+/4+  IR 4/5 T 3+/4+ Y 2+/3+; 09/19/20 R/L shoulder flex 4-/4+ abd 4/5  ER 4-/4+  IR 4+/5 T 4-/4+ Y 3+/4-; 04/04/21 R/L shoulder flex 4/5 abd 4/5  ER 4/4+  IR 5/5 T 4/4+ Y 4/4+: 07/26/21: R/L shoulder flex 4-/4 abd 4-/4 ER 4-/4-  IR 5/5  T4-/4 Y 4-/4    Time 8    Period Weeks    Status On-going                   Plan - 08/22/21 1144     Clinical Impression Statement PT continued therex progression for maintenance of mobility and increased overhead strength and mobility with success. Pt is motivated throughout session with excellent compliance of all cuing for proper technique of therex. Pt requires rest breaks between each set, with expected fatigue. PT will  continue progression as  able.    Personal Factors and Comorbidities Age;Behavior Pattern;Comorbidity 1;Comorbidity 2;Past/Current Experience;Fitness;Sex;Time since onset of injury/illness/exacerbation    Comorbidities scleroderma, cancer    Examination-Activity Limitations Reach Overhead;Self Feeding;Dressing;Sleep;Lift    Examination-Participation Restrictions Laundry;Cleaning;Community Activity;Driving    Stability/Clinical Decision Making Evolving/Moderate complexity    Clinical Decision Making Moderate    Rehab Potential Good    PT Frequency 2x / week    PT Duration 8 weeks    PT Treatment/Interventions ADLs/Self Care Home Management;Aquatic Therapy;Moist Heat;Traction;Ultrasound;Cryotherapy;Parrafin;Functional mobility training;Neuromuscular re-education;Taping;Dry needling;Passive range of motion;Joint Manipulations;Spinal Manipulations;Patient/family education;Therapeutic exercise;Manual techniques;Therapeutic activities;Electrical Stimulation;Fluidtherapy;Iontophoresis 4mg /ml Dexamethasone    PT Next Visit Plan Increase cervical ROM; postural restoration;    Consulted and Agree with Plan of Care Patient             Patient will benefit from skilled therapeutic intervention in order to improve the following deficits and impairments:  Decreased endurance, Decreased mobility, Pain, Postural dysfunction, Impaired UE functional use, Impaired flexibility, Increased fascial restricitons, Decreased strength, Decreased activity tolerance, Decreased range of motion, Decreased scar mobility, Impaired tone, Improper body mechanics, Hypomobility  Visit Diagnosis: Muscle weakness (generalized)     Problem List There are no problems to display for this patient.  Durwin Reges DPT Durwin Reges, PT 08/22/2021, 12:01 PM  [facility] [address] [place], [address] Phone: [phone]   Fax:   [phone]  Name: Whitney Mclean MRN: [id number] Date of Birth: 05-05-1966

## 2021-09-05 ENCOUNTER — Encounter: Payer: BC Managed Care – PPO | Admitting: Physical Therapy

## 2021-09-05 ENCOUNTER — Encounter: Payer: Self-pay | Admitting: Physical Therapy

## 2021-09-05 ENCOUNTER — Ambulatory Visit: Payer: BC Managed Care – PPO | Attending: Internal Medicine | Admitting: Physical Therapy

## 2021-09-05 DIAGNOSIS — M542 Cervicalgia: Secondary | ICD-10-CM | POA: Insufficient documentation

## 2021-09-05 DIAGNOSIS — M25611 Stiffness of right shoulder, not elsewhere classified: Secondary | ICD-10-CM | POA: Diagnosis present

## 2021-09-05 DIAGNOSIS — M6281 Muscle weakness (generalized): Secondary | ICD-10-CM | POA: Diagnosis present

## 2021-09-05 NOTE — Therapy (Signed)
[facility] [address] [place], [address] Phone: [phone]   Fax:  [phone]  Physical Therapy Treatment  Patient Details  Name: Whitney Mclean MRN: [id number] Date of Birth: 05-08-1966 No data recorded  Encounter Date: 09/05/2021   PT End of Session - 09/05/21 1048     Visit Number 4    Number of Visits 70    Date for PT Re-Evaluation 10/18/21    Authorization - Visit Number 20    Authorization - Number of Visits 30    Activity Tolerance Patient tolerated treatment well    Behavior During Therapy [facility] for tasks assessed/performed             Past Medical History:  Diagnosis Date   Oral cancer ([place])    Scleroderma ([place])     Past Surgical History:  Procedure Laterality Date   ABDOMINAL HYSTERECTOMY      There were no vitals filed for this visit.   Subjective Assessment - 09/05/21 1035     Subjective Continuing to do well since starting PT, completing HEP regularly. 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 Shoulder pain started 3 months ago, no mechanism of injury, was getting better with heat. Reports she had pain for a about a month, and for the past 2 months the shoulder has been stiff. Has associated neck stiffness since her surgery last year . 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 Has trouble washing her hair, cannot tie her shoes d/t reaching and hand dysfunction, cannot sit up and hold her head up without neck discomfort at her neck, cannot dress herself normally. No pain  in shoulder/neck over the past week, describes discomfort/stiffness. 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    Limitations Sitting;Writing;House hold activities;Lifting    How long can you sit comfortably? 30    How long can you stand comfortably? unlimited    How long can you walk comfortably? unlimited    Diagnostic tests None on shoulder    Patient Stated Goals Increase shoulder strength and mobility    Pain Onset More than a month ago             Therapeutic Exercise   Nu Step L2 x 5 min to promote protraction/retraction Seat 5 UE 12   Lat pulldown 25# 3x 6  with min cuing for eccentric control with good carry over   Bent over flys x12; with 1# DB bilat x10 with good carry over of initial demo and cuing; min cuing for eccentric control   STS with overhead press single 5# DB with min cuing for sequencing with good carry over   Overhead flexion bilat with elbows ext seated 2# 2x 6 with min cuing to prevent protraction       Manual Therapy  to reduce pain and tissue tension, improve range of motion, neuromodulation, in order to promote improved ability to complete functional activities. With pt reclined on wedge -  Passive cervical rotation and lateral bending to L , and ext off edge of wedge with support sustained holds (able to get upper cervical spine off mat table) STM to R side of ant neck (hardened area)                               PT Education - 09/05/21 1047     Education Details therex form/technique    Person(s) Educated Patient    Methods Explanation;Demonstration;Verbal cues    Comprehension Verbalized understanding;Returned demonstration;Verbal cues required              PT Short Term Goals - 09/15/19 1119       PT SHORT TERM GOAL #1   Title Pt will be independent with HEP in order to improve strength and decrease pain in order to improve pain-free function at home and work.    Baseline 08/04/19 Completing HEP  regularly withotu question or concern    Time 4    Period Weeks    Status Achieved               PT Long Term Goals - 07/26/21 0948       PT LONG TERM GOAL #1   Title Pt will decrease quick DASH score by at least 8% in order to demonstrate clinically significant reduction in disability.    Baseline 06/11/19 56.8%; 08/04/19 34%    Time 8    Period Weeks    Status Deferred      PT LONG TERM GOAL #2   Title Patient will demonstrate full cervical ROM in order to drive safely    Baseline ; 11/17/19 R/L rotation 35/25 Sidebending 33/31 ext -6d; ; 09/19/20 R/L rotation 35/30 Sidebending 38/46 ext 10d; 04/04/21 R/L rotation 50/28 Sidebending 27/38 ext 10d;07/26/21 Rotation: L 30 deg R 40 deg Sidebending L 27 R 25    Time 8    Period Days    Status On-going      PT LONG TERM GOAL #3   Title Patient will demonstrate full R shoulder flex A/PROM in order to complete overhead ADLs    Baseline 11/17/19 AROM flex 120d PROM flex 145; PROM/AROM of ER and IR WNL; 09/19/20 AROM flex 140d PROM flex 167; 04/04/21 AROM flex 142d PROM flex 165; 07/26/21: AROM flex 125d PROM flex 142d;    Time 8    Period Weeks    Status On-going      PT LONG TERM GOAL #4   Title Patient will demonstrate 4+/5 gross periscapular and shoulder strength in order to complete heavy household ADLs and overhead ADLs    Baseline 11/17/19 R/L shoulder flex 3+/4+ abd 4-/5  ER 3+/4+  IR 4/5 T 3+/4+ Y 2+/3+; 09/19/20 R/L shoulder flex 4-/4+ abd 4/5  ER 4-/4+  IR 4+/5 T 4-/4+ Y 3+/4-; 04/04/21 R/L shoulder flex 4/5 abd 4/5  ER 4/4+  IR 5/5 T 4/4+ Y 4/4+: 07/26/21: R/L shoulder flex 4-/4 abd 4-/4 ER 4-/4-  IR 5/5  T4-/4 Y 4-/4    Time 8    Period Weeks    Status On-going                   Plan - 09/05/21 1052     Clinical Impression Statement PT continued therex progression for maintenance of mobility with continued focus on overhead motion and functional mobility with success.Patient is able to comply with all cuing for  proper technique of therex with excellent motivation throughout session. PT advised patient to inquire with MD about SOB symptoms with supine lying (seems to be more anxiety induced than true SOB pulmonary condition) with understanding. PT will continue progression as able.    Personal Factors and Comorbidities Age;Behavior Pattern;Comorbidity 1;Comorbidity 2;Past/Current Experience;Fitness;Sex;Time since onset of injury/illness/exacerbation    Comorbidities scleroderma, cancer    Examination-Activity Limitations Reach Overhead;Self Feeding;Dressing;Sleep;Lift    Examination-Participation Restrictions Laundry;Cleaning;Community Activity;Driving    Stability/Clinical Decision Making Evolving/Moderate complexity    Clinical Decision Making Moderate    Rehab Potential Good    PT Frequency 2x / week    PT Duration 8 weeks    PT Treatment/Interventions ADLs/Self Care Home Management;Aquatic Therapy;Moist Heat;Traction;Ultrasound;Cryotherapy;Parrafin;Functional mobility training;Neuromuscular re-education;Taping;Dry needling;Passive range of motion;Joint Manipulations;Spinal Manipulations;Patient/family education;Therapeutic exercise;Manual techniques;Therapeutic activities;Electrical Stimulation;Fluidtherapy;Iontophoresis 4mg /ml Dexamethasone    PT Next Visit Plan Increase cervical ROM; postural restoration;    Consulted and Agree with Plan of Care Patient             Patient will benefit from skilled therapeutic intervention in order to improve the following deficits and impairments:  Decreased endurance, Decreased mobility, Pain, Postural dysfunction, Impaired UE functional use, Impaired flexibility, Increased fascial restricitons, Decreased strength, Decreased activity tolerance, Decreased range of motion, Decreased scar mobility, Impaired tone, Improper body mechanics, Hypomobility  Visit Diagnosis: Muscle weakness (generalized)     Problem List There are no problems to display for this  patient.   Durwin Reges, PT 09/05/2021, 11:13 AM  [facility] [address] [place], [address] Phone: [phone]   Fax:  [phone]  Name: Whitney Mclean MRN: [id number] Date of Birth: 05-02-1966

## 2021-09-19 ENCOUNTER — Encounter: Payer: Self-pay | Admitting: Physical Therapy

## 2021-09-19 ENCOUNTER — Encounter: Payer: BC Managed Care – PPO | Admitting: Physical Therapy

## 2021-09-19 ENCOUNTER — Ambulatory Visit: Payer: BC Managed Care – PPO | Admitting: Physical Therapy

## 2021-09-19 DIAGNOSIS — M542 Cervicalgia: Secondary | ICD-10-CM

## 2021-09-19 DIAGNOSIS — M6281 Muscle weakness (generalized): Secondary | ICD-10-CM | POA: Diagnosis not present

## 2021-09-19 DIAGNOSIS — M25611 Stiffness of right shoulder, not elsewhere classified: Secondary | ICD-10-CM

## 2021-09-19 NOTE — Therapy (Signed)
[facility] [address] [place], [address] Phone: [phone]   Fax:  [phone]  Physical Therapy Treatment  Patient Details  Name: Whitney Mclean MRN: [id number] Date of Birth: [birth year] No data recorded  Encounter Date: 09/19/2021   PT End of Session - 09/19/21 1128     Visit Number 5    Number of Visits 70    Date for PT Re-Evaluation 10/18/21    Authorization Type [employer] 30appts jan 2022-jan 2023    Authorization - Visit Number 21    Authorization - Number of Visits 30    PT Start Time 1116    PT Stop Time 1155    PT Time Calculation (min) 39 min    Activity Tolerance Patient tolerated treatment well    Behavior During Therapy WFL for tasks assessed/performed             Past Medical History:  Diagnosis Date   Oral cancer ([place])    Scleroderma ([place])     Past Surgical History:  Procedure Laterality Date   ABDOMINAL HYSTERECTOMY      There were no vitals filed for this visit.   Subjective Assessment - 09/19/21 1124     Subjective Doing well overall. Is still having trouble breathing with laying on her back. Is completing HEP, feeling more mobile overall.    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 Shoulder pain started 3 months ago, no mechanism of injury, was getting better with heat. Reports she had pain for a about a month, and for the past 2 months the shoulder has been stiff. Has associated neck stiffness since her surgery last year . 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 Has trouble washing her hair, cannot tie her shoes d/t reaching and hand dysfunction, cannot sit up and hold her head up without neck discomfort at her neck, cannot dress herself normally. No pain in shoulder/neck over the past  week, describes discomfort/stiffness. 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    Limitations Sitting;Writing;House hold activities;Lifting    How long can you sit comfortably? 30    How long can you stand comfortably? unlimited    How long can you walk comfortably? unlimited    Diagnostic tests None on shoulder    Patient Stated Goals Increase shoulder strength and mobility    Pain Onset More than a month ago             Therapeutic Exercise   Nu Step L2 x 5 min to promote protraction/retraction Seat 5 UE 12   Prone Y 2x 10 (head support in hole of mat table) in min ROM with cuing for eccentric control   Prone scapular retraction with cervical lift with hands behind head 2x 10 with cuing for eccentric control with good carry over  squat with overhead press single 5# DB 2x 8  with min cuing for available cervical extension and chair behind for safety   Overhead flexion bilat with elbows ext seated 2# 2x 6 with min cuing to prevent protraction       Manual Therapy  to reduce pain and tissue tension, improve range of motion, neuromodulation, in order to promote improved ability to complete functional activities. With pt reclined on  wedge - Passive cervical rotation and lateral bending to L , and ext off edge of wedge with support sustained holds (able to get upper cervical spine off mat table) STM to R side of ant neck (hardened area)                             PT Education - 09/19/21 1128     Education Details therex form/technique    Person(s) Educated Patient    Methods Explanation;Demonstration;Verbal cues    Comprehension Verbal cues required;Returned demonstration;Verbalized understanding              PT Short Term Goals - 09/15/19 1119       PT SHORT TERM GOAL #1   Title Pt will be independent with HEP in order to improve strength and decrease pain in order to improve pain-free function at home and work.    Baseline  08/04/19 Completing HEP regularly withotu question or concern    Time 4    Period Weeks    Status Achieved               PT Long Term Goals - 07/26/21 0948       PT LONG TERM GOAL #1   Title Pt will decrease quick DASH score by at least 8% in order to demonstrate clinically significant reduction in disability.    Baseline 06/11/19 56.8%; 08/04/19 34%    Time 8    Period Weeks    Status Deferred      PT LONG TERM GOAL #2   Title Patient will demonstrate full cervical ROM in order to drive safely    Baseline ; 11/17/19 R/L rotation 35/25 Sidebending 33/31 ext -6d; ; 09/19/20 R/L rotation 35/30 Sidebending 38/46 ext 10d; 04/04/21 R/L rotation 50/28 Sidebending 27/38 ext 10d;07/26/21 Rotation: L 30 deg R 40 deg Sidebending L 27 R 25    Time 8    Period Days    Status On-going      PT LONG TERM GOAL #3   Title Patient will demonstrate full R shoulder flex A/PROM in order to complete overhead ADLs    Baseline 11/17/19 AROM flex 120d PROM flex 145; PROM/AROM of ER and IR WNL; 09/19/20 AROM flex 140d PROM flex 167; 04/04/21 AROM flex 142d PROM flex 165; 07/26/21: AROM flex 125d PROM flex 142d;    Time 8    Period Weeks    Status On-going      PT LONG TERM GOAL #4   Title Patient will demonstrate 4+/5 gross periscapular and shoulder strength in order to complete heavy household ADLs and overhead ADLs    Baseline 11/17/19 R/L shoulder flex 3+/4+ abd 4-/5  ER 3+/4+  IR 4/5 T 3+/4+ Y 2+/3+; 09/19/20 R/L shoulder flex 4-/4+ abd 4/5  ER 4-/4+  IR 4+/5 T 4-/4+ Y 3+/4-; 04/04/21 R/L shoulder flex 4/5 abd 4/5  ER 4/4+  IR 5/5 T 4/4+ Y 4/4+: 07/26/21: R/L shoulder flex 4-/4 abd 4-/4 ER 4-/4-  IR 5/5  T4-/4 Y 4-/4    Time 8    Period Weeks    Status On-going                   Plan - 09/19/21 1328     Clinical Impression Statement PT continued therex progression for increased mobility, with progression to prone positioning as well. Patient with minimal ROM in prone with cuing and difficulty  with eccentric lower,  but demonstrates improvement being able to obtain prone position. Patient demonstrates good carry over of all cuing for proper technique of therex with good motivaiton throughout session, and is able to tolerate manual techniques in reclined position. PT will continue progression as able.    Personal Factors and Comorbidities Age;Behavior Pattern;Comorbidity 1;Comorbidity 2;Past/Current Experience;Fitness;Sex;Time since onset of injury/illness/exacerbation    Comorbidities scleroderma, cancer    Examination-Activity Limitations Reach Overhead;Self Feeding;Dressing;Sleep;Lift    Examination-Participation Restrictions Laundry;Cleaning;Community Activity;Driving    Stability/Clinical Decision Making Evolving/Moderate complexity    Clinical Decision Making Moderate    Rehab Potential Good    PT Frequency 2x / week    PT Duration 8 weeks    PT Treatment/Interventions ADLs/Self Care Home Management;Aquatic Therapy;Moist Heat;Traction;Ultrasound;Cryotherapy;Parrafin;Functional mobility training;Neuromuscular re-education;Taping;Dry needling;Passive range of motion;Joint Manipulations;Spinal Manipulations;Patient/family education;Therapeutic exercise;Manual techniques;Therapeutic activities;Electrical Stimulation;Fluidtherapy;Iontophoresis 4mg /ml Dexamethasone    PT Next Visit Plan Increase cervical ROM; postural restoration;    Consulted and Agree with Plan of Care Patient             Patient will benefit from skilled therapeutic intervention in order to improve the following deficits and impairments:  Decreased endurance, Decreased mobility, Pain, Postural dysfunction, Impaired UE functional use, Impaired flexibility, Increased fascial restricitons, Decreased strength, Decreased activity tolerance, Decreased range of motion, Decreased scar mobility, Impaired tone, Improper body mechanics, Hypomobility  Visit Diagnosis: Muscle weakness (generalized)  Stiffness of right  shoulder, not elsewhere classified  Cervicalgia     Problem List There are no problems to display for this patient.  Durwin Reges DPT Durwin Reges, PT 09/19/2021, 1:37 PM  [facility] [address] [place], [address] Phone: [phone]   Fax:  [phone]  Name: Whitney Mclean MRN: [id number] Date of Birth: 03/19/1966

## 2021-09-28 ENCOUNTER — Other Ambulatory Visit: Payer: Self-pay | Admitting: Internal Medicine

## 2021-09-28 DIAGNOSIS — Z1231 Encounter for screening mammogram for malignant neoplasm of breast: Secondary | ICD-10-CM

## 2021-10-03 ENCOUNTER — Encounter: Payer: BC Managed Care – PPO | Admitting: Physical Therapy

## 2021-10-05 ENCOUNTER — Ambulatory Visit: Payer: BC Managed Care – PPO | Attending: Internal Medicine | Admitting: Physical Therapy

## 2021-10-05 ENCOUNTER — Other Ambulatory Visit: Payer: Self-pay

## 2021-10-05 ENCOUNTER — Encounter: Payer: Self-pay | Admitting: Physical Therapy

## 2021-10-05 DIAGNOSIS — M542 Cervicalgia: Secondary | ICD-10-CM | POA: Insufficient documentation

## 2021-10-05 DIAGNOSIS — M6281 Muscle weakness (generalized): Secondary | ICD-10-CM | POA: Diagnosis not present

## 2021-10-05 DIAGNOSIS — M25611 Stiffness of right shoulder, not elsewhere classified: Secondary | ICD-10-CM | POA: Insufficient documentation

## 2021-10-05 NOTE — Therapy (Signed)
[facility] [address], [place], [address] Phone: [phone]   Fax:  [phone]  Physical Therapy Treatment  Patient Details  Name: Whitney Mclean MRN: [id number] Date of Birth: [birth year] No data recorded  Encounter Date: 10/05/2021   PT End of Session - 10/05/21 1029     Visit Number 6    Number of Visits 70    Date for PT Re-Evaluation 10/18/21    Authorization Type [employer] 30appts jan 2022-jan 2023    Authorization - Visit Number 40    Authorization - Number of Visits 30    PT Start Time 1017    PT Stop Time 1057    PT Time Calculation (min) 40 min    Activity Tolerance Patient tolerated treatment well    Behavior During Therapy WFL for tasks assessed/performed             Past Medical History:  Diagnosis Date   Oral cancer ([place])    Scleroderma ([place])     Past Surgical History:  Procedure Laterality Date   ABDOMINAL HYSTERECTOMY      There were no vitals filed for this visit.   Subjective Assessment - 10/05/21 1020     Subjective Pt reports she had a chest XRay d/t her breathing issues, and was found to have a "little emphysema". Is continuing to have breathing difficulty with laying down. Otherwise doing well with HEP and mobility.    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 Shoulder pain started 3 months ago, no mechanism of injury, was getting better with heat. Reports she had pain for a about a month, and for the past 2 months the shoulder has been stiff. Has associated neck stiffness since her surgery last year . 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 Has trouble washing her hair, cannot tie her shoes d/t reaching and hand dysfunction, cannot sit up and hold her head up without neck discomfort at her  neck, cannot dress herself normally. No pain in shoulder/neck over the past week, describes discomfort/stiffness. 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    Limitations Sitting;Writing;House hold activities;Lifting    How long can you sit comfortably? 30    How long can you stand comfortably? unlimited    How long can you walk comfortably? unlimited    Diagnostic tests None on shoulder    Patient Stated Goals Increase shoulder strength and mobility    Pain Onset More than a month ago             Therapeutic Exercise   Nu Step L2 x 5 min to promote protraction/retraction Seat 5 UE 12   Prone Y 2x 10 (head support in hole of mat table) in min ROM with cuing for eccentric control   Prone scapular retraction with cervical lift with hands behind head 2x 10 with cuing for eccentric control with good carry over   squat with overhead press single 6# DB 2x 8  with min cuing for available cervical extension with lowering phase   Overhead flexion bilat with elbows ext standing 1# 2x 8 standing against wall for TC       Manual Therapy  to reduce pain and tissue tension, improve range of motion, neuromodulation, in  order to promote improved ability to complete functional activities. With pt in L sidelying - Passive cervical rotation and lateral bending to L , and ext off edge  with support sustained holds (able to get upper cervical spine off mat table) STM to R side of ant neck (hardened area)                             PT Education - 10/05/21 1024     Education Details therex form/technique    Person(s) Educated Patient    Methods Explanation;Demonstration;Verbal cues    Comprehension Verbalized understanding;Returned demonstration;Verbal cues required              PT Short Term Goals - 09/15/19 1119       PT SHORT TERM GOAL #1   Title Pt will be independent with HEP in order to improve strength and decrease pain in order to improve  pain-free function at home and work.    Baseline 08/04/19 Completing HEP regularly withotu question or concern    Time 4    Period Weeks    Status Achieved               PT Long Term Goals - 07/26/21 0948       PT LONG TERM GOAL #1   Title Pt will decrease quick DASH score by at least 8% in order to demonstrate clinically significant reduction in disability.    Baseline 06/11/19 56.8%; 08/04/19 34%    Time 8    Period Weeks    Status Deferred      PT LONG TERM GOAL #2   Title Patient will demonstrate full cervical ROM in order to drive safely    Baseline ; 11/17/19 R/L rotation 35/25 Sidebending 33/31 ext -6d; ; 09/19/20 R/L rotation 35/30 Sidebending 38/46 ext 10d; 04/04/21 R/L rotation 50/28 Sidebending 27/38 ext 10d;07/26/21 Rotation: L 30 deg R 40 deg Sidebending L 27 R 25    Time 8    Period Days    Status On-going      PT LONG TERM GOAL #3   Title Patient will demonstrate full R shoulder flex A/PROM in order to complete overhead ADLs    Baseline 11/17/19 AROM flex 120d PROM flex 145; PROM/AROM of ER and IR WNL; 09/19/20 AROM flex 140d PROM flex 167; 04/04/21 AROM flex 142d PROM flex 165; 07/26/21: AROM flex 125d PROM flex 142d;    Time 8    Period Weeks    Status On-going      PT LONG TERM GOAL #4   Title Patient will demonstrate 4+/5 gross periscapular and shoulder strength in order to complete heavy household ADLs and overhead ADLs    Baseline 11/17/19 R/L shoulder flex 3+/4+ abd 4-/5  ER 3+/4+  IR 4/5 T 3+/4+ Y 2+/3+; 09/19/20 R/L shoulder flex 4-/4+ abd 4/5  ER 4-/4+  IR 4+/5 T 4-/4+ Y 3+/4-; 04/04/21 R/L shoulder flex 4/5 abd 4/5  ER 4/4+  IR 5/5 T 4/4+ Y 4/4+: 07/26/21: R/L shoulder flex 4-/4 abd 4-/4 ER 4-/4-  IR 5/5  T4-/4 Y 4-/4    Time 8    Period Weeks    Status On-going                   Plan - 10/05/21 1033     Clinical Impression Statement PT continued therex progression for increased mobility with focus on extensor mobility, with continued prone  positioning, and standing progressions with success. Pt is able to comply with all cuing for proper technique of therex with excellent motivation throughout session. PT utilized sidelying position for manual techniques, which pt is able to tolerate much better without SOB sensations. PT will continue rpogression as able.    Personal Factors and Comorbidities Age;Behavior Pattern;Comorbidity 1;Comorbidity 2;Past/Current Experience;Fitness;Sex;Time since onset of injury/illness/exacerbation    Comorbidities scleroderma, cancer    Examination-Activity Limitations Reach Overhead;Self Feeding;Dressing;Sleep;Lift    Examination-Participation Restrictions Laundry;Cleaning;Community Activity;Driving    Stability/Clinical Decision Making Evolving/Moderate complexity    Clinical Decision Making Moderate    Rehab Potential Good    PT Frequency 2x / week    PT Duration 8 weeks    PT Treatment/Interventions ADLs/Self Care Home Management;Aquatic Therapy;Moist Heat;Traction;Ultrasound;Cryotherapy;Parrafin;Functional mobility training;Neuromuscular re-education;Taping;Dry needling;Passive range of motion;Joint Manipulations;Spinal Manipulations;Patient/family education;Therapeutic exercise;Manual techniques;Therapeutic activities;Electrical Stimulation;Fluidtherapy;Iontophoresis 4mg /ml Dexamethasone    PT Next Visit Plan Increase cervical ROM; postural restoration;    Consulted and Agree with Plan of Care Patient             Patient will benefit from skilled therapeutic intervention in order to improve the following deficits and impairments:  Decreased endurance, Decreased mobility, Pain, Postural dysfunction, Impaired UE functional use, Impaired flexibility, Increased fascial restricitons, Decreased strength, Decreased activity tolerance, Decreased range of motion, Decreased scar mobility, Impaired tone, Improper body mechanics, Hypomobility  Visit Diagnosis: Muscle weakness (generalized)  Stiffness of  right shoulder, not elsewhere classified  Cervicalgia     Problem List There are no problems to display for this patient.  Durwin Reges DPT Durwin Reges, PT 10/05/2021, 10:57 AM  [facility] [address] [place], [address] Phone: [phone]   Fax:  [phone]  Name: Whitney Mclean MRN: [id number] Date of Birth: 1965/10/24

## 2021-10-16 ENCOUNTER — Other Ambulatory Visit: Payer: Self-pay

## 2021-10-16 ENCOUNTER — Encounter: Payer: Self-pay | Admitting: Physical Therapy

## 2021-10-16 ENCOUNTER — Ambulatory Visit: Payer: BC Managed Care – PPO | Admitting: Physical Therapy

## 2021-10-16 DIAGNOSIS — M6281 Muscle weakness (generalized): Secondary | ICD-10-CM | POA: Diagnosis not present

## 2021-10-16 DIAGNOSIS — M25611 Stiffness of right shoulder, not elsewhere classified: Secondary | ICD-10-CM

## 2021-10-16 NOTE — Therapy (Signed)
[facility] [address] [place], [address] Phone: [phone]   Fax:  [phone]  Physical Therapy Treatment  Patient Details  Name: Whitney Mclean MRN: [id number] Date of Birth: [birth year] No data recorded  Encounter Date: 10/16/2021   PT End of Session - 10/16/21 1212     Visit Number 7    Number of Visits 70    Date for PT Re-Evaluation 10/18/21    Authorization Type [employer] 30appts jan 2022-jan 2023    Authorization Time Period 7    Authorization - Visit Number 36    Authorization - Number of Visits 30    PT Start Time 1115    PT Stop Time 1200    PT Time Calculation (min) 45 min    Activity Tolerance Patient tolerated treatment well    Behavior During Therapy [facility] for tasks assessed/performed             Past Medical History:  Diagnosis Date   Oral cancer ([place])    Scleroderma ([place])     Past Surgical History:  Procedure Laterality Date   ABDOMINAL HYSTERECTOMY      There were no vitals filed for this visit.   Subjective Assessment - 10/16/21 1117     Subjective pnt reports getting covid booster last monday which caused fever for a few days. pnt reports nerve pain 6/10 NPS. HEP going well              Therapeutic Exercise   Nu Step L2 x 5 min to promote protraction/retraction Seat 5 UE 12  Wall Slides with foam roller 3 x12 with min cueing for thoracic extension  Standing Scapular  retractions 3 x 12    Squat with overhead press 4# DB 2x 6  with min cuing for available cervical extension with lowering phase      Manual Therapy  to reduce pain and tissue tension, improve range of motion, neuromodulation, in order to promote improved ability to complete functional activities. STM to R side of ant neck with pt in L sidelying for 15 min                            PT Education - 10/16/21 1211     Education Details therex form/ technique     Person(s) Educated Patient    Methods Explanation;Demonstration;Verbal cues    Comprehension Verbalized understanding;Returned demonstration;Verbal cues required              PT Short Term Goals - 09/15/19 1119       PT SHORT TERM GOAL #1   Title Pt will be independent with HEP in order to improve strength and decrease pain in order to improve pain-free function at home and work.    Baseline 08/04/19 Completing HEP regularly withotu question or concern    Time 4    Period Weeks    Status Achieved               PT Long Term Goals - 07/26/21 0948       PT LONG TERM GOAL #1   Title Pt will decrease quick DASH score by at least 8% in order to demonstrate clinically significant reduction in disability.    Baseline 06/11/19 56.8%; 08/04/19 34%    Time 8    Period Weeks    Status Deferred      PT LONG TERM GOAL #2  Title Patient will demonstrate full cervical ROM in order to drive safely    Baseline ; 11/17/19 R/L rotation 35/25 Sidebending 33/31 ext -6d; ; 09/19/20 R/L rotation 35/30 Sidebending 38/46 ext 10d; 04/04/21 R/L rotation 50/28 Sidebending 27/38 ext 10d;07/26/21 Rotation: L 30 deg R 40 deg Sidebending L 27 R 25    Time 8    Period Days    Status On-going      PT LONG TERM GOAL #3   Title Patient will demonstrate full R shoulder flex A/PROM in order to complete overhead ADLs    Baseline 11/17/19 AROM flex 120d PROM flex 145; PROM/AROM of ER and IR WNL; 09/19/20 AROM flex 140d PROM flex 167; 04/04/21 AROM flex 142d PROM flex 165; 07/26/21: AROM flex 125d PROM flex 142d;    Time 8    Period Weeks    Status On-going      PT LONG TERM GOAL #4   Title Patient will demonstrate 4+/5 gross periscapular and shoulder strength in order to complete heavy household ADLs and overhead ADLs    Baseline 11/17/19 R/L shoulder flex 3+/4+ abd 4-/5  ER 3+/4+  IR 4/5 T 3+/4+ Y 2+/3+; 09/19/20 R/L shoulder flex 4-/4+ abd 4/5  ER 4-/4+  IR 4+/5 T 4-/4+ Y 3+/4-; 04/04/21 R/L shoulder flex 4/5  abd 4/5  ER 4/4+  IR 5/5 T 4/4+ Y 4/4+: 07/26/21: R/L shoulder flex 4-/4 abd 4-/4 ER 4-/4-  IR 5/5  T4-/4 Y 4-/4    Time 8    Period Weeks    Status On-going                   Plan - 10/16/21 1213     Clinical Impression Statement SPT continued therex pregression for increased parasacuplar endurance and mobility. Pnt tolerated progression well requiring minimal cueing for form and sequencing. pnt verbalized and demonstrated understaning of SPT direction. SPT utilized sidelying postion for mannual therapy on the right UT which included STM in order to reduce tissue tension and increase ROM. Pnt responded well to treatment and had excellent motivation. PT will continue progression as able    Personal Factors and Comorbidities Age;Behavior Pattern;Comorbidity 1;Comorbidity 2;Past/Current Experience;Fitness;Sex;Time since onset of injury/illness/exacerbation    Comorbidities scleroderma, cancer    Examination-Activity Limitations Reach Overhead;Self Feeding;Dressing;Sleep;Lift    Examination-Participation Restrictions Laundry;Cleaning;Community Activity;Driving    Stability/Clinical Decision Making Evolving/Moderate complexity    Clinical Decision Making Moderate    Rehab Potential Good    PT Frequency 2x / week    PT Duration 8 weeks    PT Treatment/Interventions ADLs/Self Care Home Management;Aquatic Therapy;Moist Heat;Traction;Ultrasound;Cryotherapy;Parrafin;Functional mobility training;Neuromuscular re-education;Taping;Dry needling;Passive range of motion;Joint Manipulations;Spinal Manipulations;Patient/family education;Therapeutic exercise;Manual techniques;Therapeutic activities;Electrical Stimulation;Fluidtherapy;Iontophoresis 4mg /ml Dexamethasone    PT Next Visit Plan Increase cervical ROM; postural restoration;    Consulted and Agree with Plan of Care Patient             Patient will benefit from skilled therapeutic intervention in order to improve the following deficits and  impairments:  Decreased endurance, Decreased mobility, Pain, Postural dysfunction, Impaired UE functional use, Impaired flexibility, Increased fascial restricitons, Decreased strength, Decreased activity tolerance, Decreased range of motion, Decreased scar mobility, Impaired tone, Improper body mechanics, Hypomobility  Visit Diagnosis: Muscle weakness (generalized)  Stiffness of right shoulder, not elsewhere classified     Problem List There are no problems to display for this patient.    Durwin Reges DPT Claiborne Billings O'Daniel, SPT Durwin Reges, PT 10/16/2021, 2:00 PM  [facility] [address], [place], [address] Phone: [phone]   Fax:  [phone]  Name: Whitney Mclean MRN: [id number] Date of Birth: 09/20/1966

## 2021-10-17 ENCOUNTER — Encounter: Payer: BC Managed Care – PPO | Admitting: Physical Therapy

## 2021-10-31 ENCOUNTER — Encounter: Payer: Self-pay | Admitting: Physical Therapy

## 2021-10-31 ENCOUNTER — Other Ambulatory Visit: Payer: Self-pay

## 2021-10-31 ENCOUNTER — Ambulatory Visit: Payer: BC Managed Care – PPO | Attending: Internal Medicine | Admitting: Physical Therapy

## 2021-10-31 DIAGNOSIS — M25611 Stiffness of right shoulder, not elsewhere classified: Secondary | ICD-10-CM | POA: Insufficient documentation

## 2021-10-31 DIAGNOSIS — M6281 Muscle weakness (generalized): Secondary | ICD-10-CM | POA: Diagnosis not present

## 2021-10-31 DIAGNOSIS — M542 Cervicalgia: Secondary | ICD-10-CM | POA: Diagnosis present

## 2021-10-31 NOTE — Therapy (Signed)
[facility] [address] [place], [address] Phone: [phone]   Fax:  [phone]  Physical Therapy Treatment  Patient Details  Name: Whitney Mclean MRN: [id number] Date of Birth: [birth year] No data recorded  Encounter Date: 10/31/2021   PT End of Session - 10/31/21 1238     Visit Number 8    Number of Visits 70    Date for PT Re-Evaluation 10/18/21    Authorization Type [employer] 30appts jan 2022-jan 2023    Authorization Time Period 8    Authorization - Visit Number 32    Authorization - Number of Visits 30    PT Start Time 1115    PT Stop Time 1200    PT Time Calculation (min) 45 min    Activity Tolerance Patient tolerated treatment well    Behavior During Therapy [facility] for tasks assessed/performed             Past Medical History:  Diagnosis Date   Oral cancer ([place])    Scleroderma ([place])     Past Surgical History:  Procedure Laterality Date   ABDOMINAL HYSTERECTOMY      There were no vitals filed for this visit.     Therapeutic Exercise   Nu Step L2 x 5 min to promote protraction/retraction Seat 5 UE 12   Wall Slides with foam roller 3 x12 with min cueing for thoracic extension   Standing Scapular  retractions 3 x 12 GTB- min tactile cueing for proper muscle activation    Squat with overhead press 3# DB 2x 6  with min cuing for available cervical extension with lowering phase      Manual Therapy  to reduce pain and tissue tension, improve range of motion, neuromodulation, in order to promote improved ability to complete functional activities. STM to R side of ant neck with pt in L sidelying for 15 min                          PT Education - 10/31/21 1237     Education Details therex form    Person(s) Educated Patient    Methods Explanation;Demonstration;Tactile cues;Verbal cues    Comprehension Verbalized understanding;Returned demonstration               PT Short Term Goals - 09/15/19 1119       PT SHORT TERM GOAL #1   Title Pt will be independent with HEP in order to improve strength and decrease pain in order to improve pain-free function at home and work.    Baseline 08/04/19 Completing HEP regularly withotu question or concern    Time 4    Period Weeks    Status Achieved               PT Long Term Goals - 07/26/21 0948       PT LONG TERM GOAL #1   Title Pt will decrease quick DASH score by at least 8% in order to demonstrate clinically significant reduction in disability.    Baseline 06/11/19 56.8%; 08/04/19 34%    Time 8    Period Weeks    Status Deferred      PT LONG TERM GOAL #2   Title Patient will demonstrate full cervical ROM in order to drive safely    Baseline ; 11/17/19 R/L rotation 35/25 Sidebending 33/31 ext -6d; ; 09/19/20 R/L rotation 35/30 Sidebending 38/46 ext 10d; 04/04/21 R/L  rotation 50/28 Sidebending 27/38 ext 10d;07/26/21 Rotation: L 30 deg R 40 deg Sidebending L 27 R 25    Time 8    Period Days    Status On-going      PT LONG TERM GOAL #3   Title Patient will demonstrate full R shoulder flex A/PROM in order to complete overhead ADLs    Baseline 11/17/19 AROM flex 120d PROM flex 145; PROM/AROM of ER and IR WNL; 09/19/20 AROM flex 140d PROM flex 167; 04/04/21 AROM flex 142d PROM flex 165; 07/26/21: AROM flex 125d PROM flex 142d;    Time 8    Period Weeks    Status On-going      PT LONG TERM GOAL #4   Title Patient will demonstrate 4+/5 gross periscapular and shoulder strength in order to complete heavy household ADLs and overhead ADLs    Baseline 11/17/19 R/L shoulder flex 3+/4+ abd 4-/5  ER 3+/4+  IR 4/5 T 3+/4+ Y 2+/3+; 09/19/20 R/L shoulder flex 4-/4+ abd 4/5  ER 4-/4+  IR 4+/5 T 4-/4+ Y 3+/4-; 04/04/21 R/L shoulder flex 4/5 abd 4/5  ER 4/4+  IR 5/5 T 4/4+ Y 4/4+: 07/26/21: R/L shoulder flex 4-/4 abd 4-/4 ER 4-/4-  IR 5/5  T4-/4 Y 4-/4    Time 8    Period Weeks    Status On-going                    Plan - 10/31/21 1238     Clinical Impression Statement spt continued to progress therex for increased parascapular endurance. spt also peformed manal therapy on the right UT which included STM in order to reduce tissue tension and increase ROM. pnt tolerated treatment well and maintained good motivation throughout session. pnt recuired min therex cueing and demonsrated underdstanding. PT will progress as able    Personal Factors and Comorbidities Age;Behavior Pattern;Comorbidity 1;Comorbidity 2;Past/Current Experience;Fitness;Sex;Time since onset of injury/illness/exacerbation    Comorbidities scleroderma, cancer    Examination-Activity Limitations Reach Overhead;Self Feeding;Dressing;Sleep;Lift    Examination-Participation Restrictions Laundry;Cleaning;Community Activity;Driving    Stability/Clinical Decision Making Evolving/Moderate complexity    Clinical Decision Making Moderate    Rehab Potential Good    PT Frequency 2x / week    PT Duration 8 weeks    PT Treatment/Interventions ADLs/Self Care Home Management;Aquatic Therapy;Moist Heat;Traction;Ultrasound;Cryotherapy;Parrafin;Functional mobility training;Neuromuscular re-education;Taping;Dry needling;Passive range of motion;Joint Manipulations;Spinal Manipulations;Patient/family education;Therapeutic exercise;Manual techniques;Therapeutic activities;Electrical Stimulation;Fluidtherapy;Iontophoresis 4mg /ml Dexamethasone    PT Next Visit Plan Increase cervical ROM; postural restoration;    Consulted and Agree with Plan of Care Patient             Patient will benefit from skilled therapeutic intervention in order to improve the following deficits and impairments:  Decreased endurance, Decreased mobility, Pain, Postural dysfunction, Impaired UE functional use, Impaired flexibility, Increased fascial restricitons, Decreased strength, Decreased activity tolerance, Decreased range of motion, Decreased scar mobility, Impaired  tone, Improper body mechanics, Hypomobility  Visit Diagnosis: Muscle weakness (generalized)  Stiffness of right shoulder, not elsewhere classified  Cervicalgia     Problem List There are no problems to display for this patient.   Durwin Reges DPT Claiborne Billings O'Daniel, SPT Durwin Reges, PT 10/31/2021, 4:57 PM  [facility] [address], [place], [address] Phone: [phone]   Fax:  [phone]  Name: BRYTTANY TORTORELLI MRN: [id number] Date of Birth: 06/28/1966

## 2021-11-02 ENCOUNTER — Other Ambulatory Visit: Payer: Self-pay

## 2021-11-02 ENCOUNTER — Ambulatory Visit
Admission: RE | Admit: 2021-11-02 | Discharge: 2021-11-02 | Disposition: A | Discharge status: Home / Self Care | Payer: BC Managed Care – PPO | Source: Ambulatory Visit | Attending: Internal Medicine | Admitting: Internal Medicine

## 2021-11-02 DIAGNOSIS — Z1231 Encounter for screening mammogram for malignant neoplasm of breast: Secondary | ICD-10-CM | POA: Insufficient documentation

## 2021-11-07 ENCOUNTER — Encounter: Payer: BC Managed Care – PPO | Admitting: Physical Therapy

## 2021-11-14 ENCOUNTER — Encounter: Payer: Self-pay | Admitting: Physical Therapy

## 2021-11-14 ENCOUNTER — Ambulatory Visit: Payer: BC Managed Care – PPO | Admitting: Physical Therapy

## 2021-11-14 ENCOUNTER — Other Ambulatory Visit: Payer: Self-pay

## 2021-11-14 DIAGNOSIS — M542 Cervicalgia: Secondary | ICD-10-CM

## 2021-11-14 DIAGNOSIS — M6281 Muscle weakness (generalized): Secondary | ICD-10-CM

## 2021-11-14 DIAGNOSIS — M25611 Stiffness of right shoulder, not elsewhere classified: Secondary | ICD-10-CM

## 2021-11-14 NOTE — Addendum Note (Signed)
Addended by: Ramonita Lab on: 11/14/2021 03:50 PM   Modules accepted: Orders

## 2021-11-14 NOTE — Therapy (Signed)
[facility] [address], [place], [address] Phone: [phone]   Fax:  [phone]  Physical Therapy Treatment/Re-certification Note Reporting Period 04/04/21 - 11/14/21    Patient Details  Name: Whitney Mclean MRN: [id number] Date of Birth: 06/27/1966 No data recorded  Encounter Date: 11/14/2021   PT End of Session - 11/14/21 1211     Visit Number 9    Number of Visits 70    Date for PT Re-Evaluation 10/18/21    Authorization Type [employer] 30appts jan 2022-jan 2023    Authorization Time Period --    Authorization - Visit Number --    Authorization - Number of Visits 30    Progress Note Due on Visit 10    PT Start Time 1115    PT Stop Time 1200    PT Time Calculation (min) 45 min    Activity Tolerance Patient tolerated treatment well    Behavior During Therapy [facility] for tasks assessed/performed             Past Medical History:  Diagnosis Date   Oral cancer ([place])    Scleroderma ([place])     Past Surgical History:  Procedure Laterality Date   ABDOMINAL HYSTERECTOMY      There were no vitals filed for this visit.   Subjective Assessment - 11/14/21 1121     Subjective Pnt reports no pain and  seeing her rheumatologist yesterday with good reports.    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 Shoulder pain started 3 months ago, no mechanism of injury, was getting better with heat. Reports she had pain for a about a month, and for the past 2 months the shoulder has been stiff. Has associated neck stiffness since her surgery last year . 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 Has trouble washing her hair, cannot tie her shoes d/t reaching and hand dysfunction, cannot sit up and hold her head up without neck discomfort at  her neck, cannot dress herself normally. No pain in shoulder/neck over the past week, describes discomfort/stiffness. 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    Limitations Sitting;Writing;House hold activities;Lifting    How long can you sit comfortably? 30    How long can you stand comfortably? unlimited    How long can you walk comfortably? unlimited    Diagnostic tests None on shoulder    Patient Stated Goals Increase shoulder strength and mobility    Currently in Pain? No/denies               Therapeutic Exercise Nu Step L2 x 5 min to promote protraction/retraction Seat 5 UE 12 Wall Slides with foam roller 3 x12 with min verbal cueing for thoracic extension Standing Scapular  retractions 3 x 12 GTB- min tactile cueing for proper muscle activation  Seated bilateral shoulder ER with YTB 3 x 8- mod tactile cueing for scapular retraction Squat with overhead press 3# DB 2 x 6  with min cuing for available cervical extension with lowering phase      Manual Therapy  to reduce pain and tissue tension, improve range of motion, neuromodulation, in order to promote improved ability to complete functional activities. STM to R side of ant neck with pt in L  sidelying for 10 min   Goals to be addressed next session.   Patient's condition has the potential to improve in response to therapy. Maximum improvement is yet to be obtained. The anticipated improvement is attainable and reasonable in a generally predictable time.          PT Education - 11/14/21 1211     Education Details therex form and posture    Person(s) Educated Patient    Methods Explanation;Demonstration;Tactile cues;Verbal cues    Comprehension Verbalized understanding;Returned demonstration              PT Short Term Goals - 09/15/19 1119       PT SHORT TERM GOAL #1   Title Pt will be independent with HEP in order to improve strength and decrease pain in order to improve pain-free function at  home and work.    Baseline 08/04/19 Completing HEP regularly withotu question or concern    Time 4    Period Weeks    Status Achieved               PT Long Term Goals - 07/26/21 0948       PT LONG TERM GOAL #1   Title Pt will decrease quick DASH score by at least 8% in order to demonstrate clinically significant reduction in disability.    Baseline 06/11/19 56.8%; 08/04/19 34%    Time 8    Period Weeks    Status Deferred      PT LONG TERM GOAL #2   Title Patient will demonstrate full cervical ROM in order to drive safely    Baseline ; 11/17/19 R/L rotation 35/25 Sidebending 33/31 ext -6d; ; 09/19/20 R/L rotation 35/30 Sidebending 38/46 ext 10d; 04/04/21 R/L rotation 50/28 Sidebending 27/38 ext 10d;07/26/21 Rotation: L 30 deg R 40 deg Sidebending L 27 R 25    Time 8    Period Days    Status On-going      PT LONG TERM GOAL #3   Title Patient will demonstrate full R shoulder flex A/PROM in order to complete overhead ADLs    Baseline 11/17/19 AROM flex 120d PROM flex 145; PROM/AROM of ER and IR WNL; 09/19/20 AROM flex 140d PROM flex 167; 04/04/21 AROM flex 142d PROM flex 165; 07/26/21: AROM flex 125d PROM flex 142d;    Time 8    Period Weeks    Status On-going      PT LONG TERM GOAL #4   Title Patient will demonstrate 4+/5 gross periscapular and shoulder strength in order to complete heavy household ADLs and overhead ADLs    Baseline 11/17/19 R/L shoulder flex 3+/4+ abd 4-/5  ER 3+/4+  IR 4/5 T 3+/4+ Y 2+/3+; 09/19/20 R/L shoulder flex 4-/4+ abd 4/5  ER 4-/4+  IR 4+/5 T 4-/4+ Y 3+/4-; 04/04/21 R/L shoulder flex 4/5 abd 4/5  ER 4/4+  IR 5/5 T 4/4+ Y 4/4+: 07/26/21: R/L shoulder flex 4-/4 abd 4-/4 ER 4-/4-  IR 5/5  T4-/4 Y 4-/4    Time 8    Period Weeks    Status On-going                   Plan - 11/14/21 1212     Clinical Impression Statement Pnt presented with no pain today. SPT progresed therex to include parascapular endurance and gross UE strength. Pnt tolereated therex  well with no increase  in symptoms and able to comply with mod tactile and verbal cueing. SPT followed therex  with STM to right side of anterior neck with pnt in sidelying to reduce pain and neural tension to promote improved  ability to complete functional ADLs. Pnt reported in decrease in neural tension following manual therapy and maintained good motivation throughout session. PT will continue as able. Patient's condition has the potential to improve in response to therapy. Maximum improvement is yet to be obtained. The anticipated improvement is attainable and reasonable in a generally predictable time.    Personal Factors and Comorbidities Age;Behavior Pattern;Comorbidity 1;Comorbidity 2;Past/Current Experience;Fitness;Sex;Time since onset of injury/illness/exacerbation    Comorbidities scleroderma, cancer    Examination-Activity Limitations Reach Overhead;Self Feeding;Dressing;Sleep;Lift    Examination-Participation Restrictions Laundry;Cleaning;Community Activity;Driving    Stability/Clinical Decision Making Evolving/Moderate complexity    Clinical Decision Making Moderate    Rehab Potential Good    PT Frequency 2x / week    PT Duration 8 weeks    PT Treatment/Interventions ADLs/Self Care Home Management;Aquatic Therapy;Moist Heat;Traction;Ultrasound;Cryotherapy;Parrafin;Functional mobility training;Neuromuscular re-education;Taping;Dry needling;Passive range of motion;Joint Manipulations;Spinal Manipulations;Patient/family education;Therapeutic exercise;Manual techniques;Therapeutic activities;Electrical Stimulation;Fluidtherapy;Iontophoresis 4mg /ml Dexamethasone    PT Next Visit Plan Increase cervical ROM; postural restoration;    Consulted and Agree with Plan of Care Patient             Patient will benefit from skilled therapeutic intervention in order to improve the following deficits and impairments:  Decreased endurance, Decreased mobility, Pain, Postural dysfunction, Impaired UE  functional use, Impaired flexibility, Increased fascial restricitons, Decreased strength, Decreased activity tolerance, Decreased range of motion, Decreased scar mobility, Impaired tone, Improper body mechanics, Hypomobility  Visit Diagnosis: Muscle weakness (generalized)  Stiffness of right shoulder, not elsewhere classified  Cervicalgia     Problem List There are no problems to display for this patient.   Claiborne Billings O'Daniel, SPT Patrina Levering PT, DPT   [facility] [address], [place], [address] Phone: [phone]   Fax:  [phone]  Name: Whitney Mclean MRN: [id number] Date of Birth: Jul 16, 1966

## 2021-11-28 ENCOUNTER — Other Ambulatory Visit: Payer: Self-pay

## 2021-11-28 ENCOUNTER — Ambulatory Visit: Payer: BC Managed Care – PPO | Attending: Internal Medicine | Admitting: Physical Therapy

## 2021-11-28 ENCOUNTER — Encounter: Payer: Self-pay | Admitting: Physical Therapy

## 2021-11-28 DIAGNOSIS — M79641 Pain in right hand: Secondary | ICD-10-CM | POA: Diagnosis present

## 2021-11-28 DIAGNOSIS — M25611 Stiffness of right shoulder, not elsewhere classified: Secondary | ICD-10-CM | POA: Diagnosis present

## 2021-11-28 DIAGNOSIS — M542 Cervicalgia: Secondary | ICD-10-CM | POA: Insufficient documentation

## 2021-11-28 DIAGNOSIS — M6281 Muscle weakness (generalized): Secondary | ICD-10-CM | POA: Diagnosis present

## 2021-11-28 NOTE — Therapy (Signed)
[facility] [address], [place], [address] Phone: [phone]   Fax:  [phone]  Physical Therapy Treatment/Physical Therapy Progress Note/Re-certification    Dates of reporting period  07/26/21   to   11/28/21   Patient Details  Name: Whitney Mclean MRN: [id number] Date of Birth: 08-Mar-1966 No data recorded  Encounter Date: 11/28/2021   PT End of Session - 11/28/21 1245     Visit Number 10    Number of Visits 26    Date for PT Re-Evaluation 01/23/22    Authorization Type [employer] 30appts jan 2022-jan 2023    Authorization - Number of Visits 30    Progress Note Due on Visit 37    PT Start Time 1133    PT Stop Time 1214    PT Time Calculation (min) 41 min    Activity Tolerance Patient tolerated treatment well    Behavior During Therapy [facility] for tasks assessed/performed             Past Medical History:  Diagnosis Date   Oral cancer ([place])    Scleroderma ([place])     Past Surgical History:  Procedure Laterality Date   ABDOMINAL HYSTERECTOMY      There were no vitals filed for this visit.     Subjective Assessment - 11/28/21 1136     Subjective Patient states she has constant nerve pain from shingles, otherwise no pain. ECHO and SLP scheduled for tomorrow.    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 Shoulder pain started 3 months ago, no mechanism of injury, was getting better with heat. Reports she had pain for a about a month, and for the past 2 months the shoulder has been stiff. Has associated neck stiffness since her surgery last year . 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 Has trouble washing her hair, cannot tie her shoes d/t reaching and hand dysfunction, cannot sit up and hold her head up without neck  discomfort at her neck, cannot dress herself normally. No pain in shoulder/neck over the past week, describes discomfort/stiffness. 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    Limitations Sitting;Writing;House hold activities;Lifting    How long can you sit comfortably? 30    How long can you stand comfortably? unlimited    How long can you walk comfortably? unlimited    Diagnostic tests None on shoulder    Patient Stated Goals Increase shoulder strength and mobility    Currently in Pain? No/denies            **R SHOUDLER/ CERVICAL**   GOALS  Cervical ROM: F 40, E 35, LF R24, LF L 20, R R 21, R L 18 GH flexion: WFL 155/160 Periscapular/shoulder MMT: -flex 4 -abd 4 -IR 4 -ER 3+ -Y 3+ -T 3+   Therapeutic Exercise   NuStep L2 x 5 min to promote protraction/retraction Seat 5 UE 12   Wall Slides with foam roller, 3x10   -cueing for cervical and thoracic extension   Standing row with GTB, 3x10  Standing iron cross YTB, performed with RUE only and LUE stabilizing. VC to resist triceps activation. 3x10  -added to HEP, YRB given to pt.        Manual Therapy STM and  scar tissue mobilization to R cervical musculature including scalenes, upper trap, SCM, levator scap, cervical extensors. Pt in L sidelying. 15 minutes. Performed to reduce pain and tissue tension, improve range of motion, neuromodulation, in order to promote improved ability to complete functional activities.     Clinical Impression: Pt pleasant and motivated throughout session. Goals were assessed for progress note. Active and passive shoulder ROM of RUE has improved to allow full and functional movement. Strength of periscapular musculature and posture continue to present as most significant limitations. Cervical and upper thoracic spine are hypomobile; decreased elasticity and adherence of skin to underlying tissue and structures. Pt will continue to benefit from PT to progressively address tissue  quality, posture and strength for improved function at home, completion of ADLs and safer driving.    Patient's condition has the potential to improve in response to therapy. Maximum improvement is yet to be obtained. The anticipated improvement is attainable and reasonable in a generally predictable time.                PT Short Term Goals - 09/15/19 1119       PT SHORT TERM GOAL #1   Title Pt will be independent with HEP in order to improve strength and decrease pain in order to improve pain-free function at home and work.    Baseline 08/04/19 Completing HEP regularly withotu question or concern    Time 4    Period Weeks    Status Achieved               PT Long Term Goals - 11/28/21 1241       PT LONG TERM GOAL #1   Title Pt will decrease quick DASH score by at least 8% in order to demonstrate clinically significant reduction in disability.    Baseline 06/11/19 56.8%; 08/04/19 34%    Time 8    Period Weeks    Status Deferred      PT LONG TERM GOAL #2   Title Patient will demonstrate full cervical ROM in order to drive safely    Baseline 11/17/19 R/L rotation 35/25 Sidebending 33/31 ext -6d; ; 09/19/20 R/L rotation 35/30 Sidebending 38/46 ext 10d; 04/04/21 R/L rotation 50/28 Sidebending 27/38 ext 10d;07/26/21 Rotation: L 30 deg R 40 deg Sidebending L 27 R 25; 11/28/21: F 40, E 35, LFlex R 24, LFlex L 20, Rot R 21, Rot L 18.    Time 8    Period Weeks    Status On-going    Target Date 01/23/22      PT LONG TERM GOAL #3   Title Patient will demonstrate full R shoulder flex A/PROM in order to complete overhead ADLs    Baseline 11/17/19 AROM flex 120d PROM flex 145; PROM/AROM of ER and IR WNL; 09/19/20 AROM flex 140d PROM flex 167; 04/04/21 AROM flex 142d PROM flex 165; 07/26/21: AROM flex 125d PROM flex 142d; 11/28/21: flexion 155 active/ 160 passive    Time 8    Period Weeks    Status Achieved      PT LONG TERM GOAL #4   Title Patient will demonstrate 4+/5 gross  periscapular and shoulder strength in order to complete heavy household ADLs and overhead ADLs    Baseline 11/17/19 R/L shoulder flex 3+/4+ abd 4-/5  ER 3+/4+  IR 4/5 T 3+/4+ Y 2+/3+; 09/19/20 R/L shoulder flex 4-/4+ abd 4/5  ER 4-/4+  IR 4+/5 T 4-/4+ Y 3+/4-; 04/04/21 R/L shoulder flex 4/5 abd 4/5  ER 4/4+  IR 5/5 T 4/4+ Y 4/4+: 07/26/21: R/L shoulder flex 4-/4 abd 4-/4 ER 4-/4-  IR 5/5  T4-/4 Y 4-/4... 11/28/21: flexion 4/4, abduction 4-/4, ER 3+/4-, IR 4/4, Y 3+/3+, T 3+/3+    Time 8    Period Weeks    Status On-going    Target Date 01/23/22                   Plan - 11/28/21 1259     Clinical Impression Statement Pt pleasant and motivated throughout session. Goals were assessed for progress note. Active and passive shoulder ROM of RUE has improved to allow full and functional movement. Strength of periscapular musculature and posture continue to present as most significant limitations. Cervical and upper thoracic spine are hypomobile; decreased elasticity and adherance of skin to underlying tissue and structures. Pt will continue to benefit from PT to progressively address tissue quality, posture and strength for improved function at home, completion of ADLs and safer driving.    Personal Factors and Comorbidities Age;Behavior Pattern;Comorbidity 1;Comorbidity 2;Past/Current Experience;Fitness;Sex;Time since onset of injury/illness/exacerbation    Comorbidities scleroderma, cancer    Examination-Activity Limitations Reach Overhead;Self Feeding;Dressing;Sleep;Lift    Examination-Participation Restrictions Laundry;Cleaning;Community Activity;Driving    Stability/Clinical Decision Making Evolving/Moderate complexity    Rehab Potential Good    PT Frequency 2x / week    PT Duration 8 weeks    PT Treatment/Interventions ADLs/Self Care Home Management;Aquatic Therapy;Moist Heat;Traction;Ultrasound;Cryotherapy;Parrafin;Functional mobility training;Neuromuscular re-education;Taping;Dry  needling;Passive range of motion;Joint Manipulations;Spinal Manipulations;Patient/family education;Therapeutic exercise;Manual techniques;Therapeutic activities;Electrical Stimulation;Fluidtherapy;Iontophoresis '4mg'$ /ml Dexamethasone    PT Next Visit Plan Increase cervical ROM; postural restoration;    Consulted and Agree with Plan of Care Patient             Patient will benefit from skilled therapeutic intervention in order to improve the following deficits and impairments:  Decreased endurance, Decreased mobility, Pain, Postural dysfunction, Impaired UE functional use, Impaired flexibility, Increased fascial restricitons, Decreased strength, Decreased activity tolerance, Decreased range of motion, Decreased scar mobility, Impaired tone, Improper body mechanics, Hypomobility  Visit Diagnosis: Muscle weakness (generalized)  Stiffness of right shoulder, not elsewhere classified  Cervicalgia  Pain in right hand     Problem List There are no problems to display for this patient.   Patrina Levering PT, DPT   [facility] [address], [place], [address] Phone: [phone]   Fax:  [phone]  Name: DELISSA SILBA MRN: [id number] Date of Birth: 1965-12-03

## 2021-12-12 ENCOUNTER — Ambulatory Visit: Payer: BC Managed Care – PPO

## 2021-12-12 ENCOUNTER — Other Ambulatory Visit: Payer: Self-pay

## 2021-12-12 DIAGNOSIS — M6281 Muscle weakness (generalized): Secondary | ICD-10-CM

## 2021-12-12 DIAGNOSIS — M79641 Pain in right hand: Secondary | ICD-10-CM

## 2021-12-12 DIAGNOSIS — M542 Cervicalgia: Secondary | ICD-10-CM

## 2021-12-12 DIAGNOSIS — M25611 Stiffness of right shoulder, not elsewhere classified: Secondary | ICD-10-CM

## 2021-12-12 NOTE — Addendum Note (Signed)
Addended by: Etta Grandchild on: 12/12/2021 12:24 PM ? ? Modules accepted: Orders ? ?

## 2021-12-12 NOTE — Therapy (Addendum)
[place] ?[facility] ?[address] ?[place], [place], [address] ?Phone: [phone]   Fax:  [phone] ? ?Physical Therapy Treatment ? ?Patient Details  ?Name: Whitney Mclean ?MRN: [id number] ?Date of Birth: 05-16-1966 ?No data recorded ? ?Encounter Date: 12/12/2021 ? ? PT End of Session - 12/12/21 1143   ? ? Visit Number 11   ? Number of Visits 26   ? Date for PT Re-Evaluation 01/23/22   ? Authorization Type BCBS   ? Authorization Time Period 12/12/21-01/23/22   ? Progress Note Due on Visit 20   ? PT Start Time 1132   ? PT Stop Time 1212   ? PT Time Calculation (min) 40 min   ? Activity Tolerance Patient tolerated treatment well;No increased pain   ? Behavior During Therapy [facility] for tasks assessed/performed   ? ?  ?  ? ?  ? ? ?Past Medical History:  ?Diagnosis Date  ? Oral cancer ([place])   ? Scleroderma ([place])   ? ? ?Past Surgical History:  ?Procedure Laterality Date  ? ABDOMINAL HYSTERECTOMY    ? ? ?There were no vitals filed for this visit. ? ? Subjective Assessment - 12/12/21 1139   ? ? Subjective Pt doing well today, left flank pain chronic from shingles largely unchanged. Pt reports she remains diligent with exercises.   ? 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   ? Currently in Pain? Yes   ? Pain Score 5    5/10 Left flank  ? ?  ?  ? ?  ? ? ?INTERVENTION ? ?NuStep L2 x 5 min to promote protraction/retraction Seat 5 UE 12 ?Treadmill BUE flexion stretch 3x30sec  ?RUE physioball rollout ABDCT stretch 5x15sec ?LUE ball rollout ABDCT x3, then TRX walkout 3x15sec ?-cable row 1x10 @ 10lb ?-RUE shoulder ABDCT 1x10 @ 2lb, LUE 1x10 @ 2 ?-cable row 1x10 @ 8lb ?-RUE flexion 1x10 @ 2lb (too heavy)  ?-RUE flexion 1x10 @ 1lb  ?-BUE overhead press with long PVC x10 and STS   ?-BUE standing pushup off chest press bar (mostly vertical) x10  ?-BUE overhead press with long PVC x10 and STS  ?-BUE standing pushup off chest press bar (mostly vertical) x10  ? ? ? ? PT Education - 12/12/21 1150   ? ? Education Details form modificaiton for stretches   ? Person(s) Educated Patient   ? Methods Explanation;Demonstration   ? Comprehension Verbalized understanding   ? ?  ?  ? ?  ? ? ? PT Short Term Goals - 09/15/19 1119   ? ?  ? PT SHORT TERM GOAL #1  ? Title Pt will be independent with HEP in order to improve strength and decrease pain in order to improve pain-free function at home and work.   ? Baseline 08/04/19 Completing HEP regularly withotu question or concern   ? Time 4   ? Period Weeks   ? Status Achieved   ? ?  ?  ? ?  ? ? ? ? PT Long Term Goals - 11/28/21 1241   ? ?  ? PT LONG TERM GOAL #1  ? Title Pt will decrease quick DASH score by at least 8% in order to demonstrate clinically significant reduction in disability.   ? Baseline 06/11/19 56.8%;  08/04/19 34%   ? Time 8   ? Period Weeks   ? Status Deferred   ?  ? PT LONG TERM GOAL #2  ? Title Patient will demonstrate full cervical ROM in order to drive safely   ? Baseline 11/17/19 R/L rotation 35/25 Sidebending 33/31 ext -6d; ; 09/19/20 R/L rotation 35/30 Sidebending 38/46 ext 10d; 04/04/21 R/L rotation 50/28 Sidebending 27/38 ext 10d;07/26/21 Rotation: L 30 deg R 40 deg Sidebending L 27 R 25; 11/28/21: F 40, E 35, LFlex R 24, LFlex L 20, Rot R 21, Rot L 18.   ? Time 8   ? Period Weeks   ? Status On-going   ? Target Date 01/23/22   ?  ? PT LONG TERM GOAL #3  ? Title Patient will demonstrate full R shoulder flex A/PROM in order to complete overhead ADLs   ? Baseline 11/17/19 AROM flex 120d PROM flex 145; PROM/AROM of ER and IR WNL; 09/19/20 AROM flex 140d PROM flex 167; 04/04/21 AROM flex 142d PROM flex 165; 07/26/21: AROM flex 125d PROM flex 142d; 11/28/21: flexion 155 active/ 160 passive   ? Time 8   ? Period Weeks   ? Status Achieved   ?  ? PT LONG  TERM GOAL #4  ? Title Patient will demonstrate 4+/5 gross periscapular and shoulder strength in order to complete heavy household ADLs and overhead ADLs   ? Baseline 11/17/19 R/L shoulder flex 3+/4+ abd 4-/5  ER 3+/4+  IR 4/5 T 3+/4+ Y 2+/3+; 09/19/20 R/L shoulder flex 4-/4+ abd 4/5  ER 4-/4+  IR 4+/5 T 4-/4+ Y 3+/4-; 04/04/21 R/L shoulder flex 4/5 abd 4/5  ER 4/4+  IR 5/5 T 4/4+ Y 4/4+: 07/26/21: R/L shoulder flex 4-/4 abd 4-/4 ER 4-/4-  IR 5/5  T4-/4 Y 4-/4... 11/28/21: flexion 4/4, abduction 4-/4, ER 3+/4-, IR 4/4, Y 3+/3+, T 3+/3+   ? Time 8   ? Period Weeks   ? Status On-going   ? Target Date 01/23/22   ? ?  ?  ? ?  ? ? ? ? ? ? ? ? Plan - 12/12/21 1212   ? ? Clinical Impression Statement  Continued with heavy thoracoscapuloglenohumeral ROM and coupling with LLLD stretching, then continued to advance open chain strength in RUE. Pt struggles to find good level of resistance between 1 and 2 lbs due to lack of smaller increments in weight. Session tolerated without pain increase, but pt is easily fatigues with higher intensity strengthening.   ? Personal Factors and Comorbidities Age;Behavior Pattern;Comorbidity 1;Comorbidity 2;Past/Current Experience;Fitness;Sex;Time since onset of injury/illness/exacerbation   ? Comorbidities scleroderma, cancer   ? Examination-Activity Limitations Reach Overhead;Self Feeding;Dressing;Sleep;Lift   ? Examination-Participation Restrictions Laundry;Cleaning;Community Activity;Driving   ? Stability/Clinical Decision Making Evolving/Moderate complexity   ? Clinical Decision Making Moderate   ? Rehab Potential Good   ? PT Frequency 2x / week   ? PT Duration 8 weeks   ? PT Treatment/Interventions ADLs/Self Care Home Management;Aquatic Therapy;Moist Heat;Traction;Ultrasound;Cryotherapy;Parrafin;Functional mobility training;Neuromuscular re-education;Taping;Dry needling;Passive range of motion;Joint Manipulations;Spinal Manipulations;Patient/family education;Therapeutic exercise;Manual  techniques;Therapeutic activities;Electrical Stimulation;Fluidtherapy;Iontophoresis '4mg'$ /ml Dexamethasone   ? PT Next Visit Plan Increase cervical ROM; postural restoration;   ? PT Home Exercise Plan no updates today   ? Consulted and Agree with Plan of Care Patient   ? ?  ?  ? ?  ? ? ?Patient will benefit from skilled therapeutic intervention in order to improve the following deficits and impairments:  Decreased endurance, Decreased mobility, Pain, Postural dysfunction, Impaired UE  functional use, Impaired flexibility, Increased fascial restricitons, Decreased strength, Decreased activity tolerance, Decreased range of motion, Decreased scar mobility, Impaired tone, Improper body mechanics, Hypomobility ? ?Visit Diagnosis: ?Muscle weakness (generalized) - Plan: PT plan of care cert/re-cert ? ?Stiffness of right shoulder, not elsewhere classified - Plan: PT plan of care cert/re-cert ? ?Cervicalgia - Plan: PT plan of care cert/re-cert ? ?Pain in right hand - Plan: PT plan of care cert/re-cert ? ? ? ? ?Problem List ?There are no problems to display for this patient. ? ?12:50 PM, 12/12/21 ?Etta Grandchild, PT, DPT ?Physical Therapist - [place] ?[phone] (Office) ? ? ? ?Whitney Mclean, PT ?12/12/2021, 12:50 PM ? ?[place] ?[facility] ?[address] ?[place], [place], [address] ?Phone: [phone]   Fax:  [phone] ? ?Name: Whitney Mclean ?MRN: [id number] ?Date of Birth: 1966-03-16 ? ? ? ?

## 2021-12-26 ENCOUNTER — Ambulatory Visit: Payer: Medicare Other | Attending: Internal Medicine

## 2021-12-26 DIAGNOSIS — M542 Cervicalgia: Secondary | ICD-10-CM

## 2021-12-26 DIAGNOSIS — M25611 Stiffness of right shoulder, not elsewhere classified: Secondary | ICD-10-CM | POA: Diagnosis present

## 2021-12-26 DIAGNOSIS — M6281 Muscle weakness (generalized): Secondary | ICD-10-CM

## 2021-12-26 DIAGNOSIS — M79641 Pain in right hand: Secondary | ICD-10-CM | POA: Diagnosis present

## 2021-12-26 NOTE — Therapy (Signed)
[place] ?[facility] ?[address] ?[place], [place], [address] ?Phone: [phone]   Fax:  [phone] ? ?Physical Therapy Treatment ? ?Patient Details  ?Name: Whitney Mclean ?MRN: [id number] ?Date of Birth: 1965/11/04 ?No data recorded ? ?Encounter Date: 12/26/2021 ? ? PT End of Session - 12/26/21 1220   ? ? Visit Number 12   ? Number of Visits 26   ? Date for PT Re-Evaluation 01/23/22   ? Authorization Type BCBS   ? Authorization Time Period 12/12/21-01/23/22   ? Progress Note Due on Visit 20   ? PT Start Time 1125   ? PT Stop Time 3662   ? PT Time Calculation (min) 50 min   ? Activity Tolerance Patient tolerated treatment well;No increased pain   ? Behavior During Therapy [facility] for tasks assessed/performed   ? ?  ?  ? ?  ? ? ?Past Medical History:  ?Diagnosis Date  ? Oral cancer ([place])   ? Scleroderma ([place])   ? ? ?Past Surgical History:  ?Procedure Laterality Date  ? ABDOMINAL HYSTERECTOMY    ? ? ?There were no vitals filed for this visit. ? ? Subjective Assessment - 12/26/21 1132   ? ? Subjective Pt doing well, was sore in legs last session but tolerable. Pt reports he chronic shingles pain is unchanged, aroudn 3/10 in Left upper anterior chest.   ? Pertinent History Whitney Mclean is a 62yoF who comes to [place] due to ongoing global stiffness in neck and shoulders in the setting of new scleroderma s/p radiation and chemotherapy 2/2 oral cancer (2016) and removal of Rt lymph nodes.   ? ?  ?  ? ?  ? ? ? ?INTERVENTION THIS DATE: ?-Nu Step L2 x 5 min to promote protraction/retraction Seat 5 UE 12 ?-sidelying sustained release Rt upper trap with and without Rt scapular P/ROM Stretching  ?-sidelying book opening thoracic spine P/ROM Stretch 2x30sec bilat  ?-seated in chair, cervical rotation stretch 2x30sec bilat  ?-A/ROM rotation c manual end-range OP 1x15 ?-seated row 1x15 @ 10lb, 2 sets  ? ? ? ? ? ? ? ? PT Education - 12/26/21 1221   ? ? Education Details  potential for home LLLD stretching   ? Person(s) Educated Patient   ? Methods Explanation   ? Comprehension Verbalized understanding   ? ?  ?  ? ?  ? ? ? PT Short Term Goals - 09/15/19 1119   ? ?  ? PT SHORT TERM GOAL #1  ? Title Pt will be independent with HEP in order to improve strength and decrease pain in order to improve pain-free function at home and work.   ? Baseline 08/04/19 Completing HEP regularly withotu question or concern   ? Time 4   ? Period Weeks   ? Status Achieved   ? ?  ?  ? ?  ? ? ? ? PT Long Term Goals - 11/28/21 1241   ? ?  ? PT LONG TERM GOAL #1  ? Title Pt will decrease quick DASH score by at least 8% in order to demonstrate clinically significant reduction in disability.   ? Baseline 06/11/19 56.8%; 08/04/19 34%   ? Time 8   ? Period Weeks   ? Status Deferred   ?  ? PT LONG TERM GOAL #2  ? Title Patient will demonstrate full cervical ROM in order to drive safely   ? Baseline 11/17/19 R/L rotation 35/25 Sidebending 33/31 ext -  6d; ; 09/19/20 R/L rotation 35/30 Sidebending 38/46 ext 10d; 04/04/21 R/L rotation 50/28 Sidebending 27/38 ext 10d;07/26/21 Rotation: L 30 deg R 40 deg Sidebending L 27 R 25; 11/28/21: F 40, E 35, LFlex R 24, LFlex L 20, Rot R 21, Rot L 18.   ? Time 8   ? Period Weeks   ? Status On-going   ? Target Date 01/23/22   ?  ? PT LONG TERM GOAL #3  ? Title Patient will demonstrate full R shoulder flex A/PROM in order to complete overhead ADLs   ? Baseline 11/17/19 AROM flex 120d PROM flex 145; PROM/AROM of ER and IR WNL; 09/19/20 AROM flex 140d PROM flex 167; 04/04/21 AROM flex 142d PROM flex 165; 07/26/21: AROM flex 125d PROM flex 142d; 11/28/21: flexion 155 active/ 160 passive   ? Time 8   ? Period Weeks   ? Status Achieved   ?  ? PT LONG TERM GOAL #4  ? Title Patient will demonstrate 4+/5 gross periscapular and shoulder strength in order to complete heavy household ADLs and overhead ADLs   ? Baseline 11/17/19 R/L shoulder flex 3+/4+ abd 4-/5  ER 3+/4+  IR 4/5 T 3+/4+ Y 2+/3+; 09/19/20  R/L shoulder flex 4-/4+ abd 4/5  ER 4-/4+  IR 4+/5 T 4-/4+ Y 3+/4-; 04/04/21 R/L shoulder flex 4/5 abd 4/5  ER 4/4+  IR 5/5 T 4/4+ Y 4/4+: 07/26/21: R/L shoulder flex 4-/4 abd 4-/4 ER 4-/4-  IR 5/5  T4-/4 Y 4-/4... 11/28/21: flexion 4/4, abduction 4-/4, ER 3+/4-, IR 4/4, Y 3+/3+, T 3+/3+   ? Time 8   ? Period Weeks   ? Status On-going   ? Target Date 01/23/22   ? ?  ?  ? ?  ? ? ? ? ? ? ? ? Plan - 12/26/21 1225   ? ? Clinical Impression Statement Heavier emphasis this date on soft tissue work to fibrotic areas of neck, AA/ROM, A/ROM, as prior session was more strength based. Pt givens good verbal feedback on amount of desired/tolerated stretch.   ? Personal Factors and Comorbidities Age;Behavior Pattern;Comorbidity 1;Comorbidity 2;Past/Current Experience;Fitness;Sex;Time since onset of injury/illness/exacerbation   ? Comorbidities scleroderma, cancer   ? Examination-Activity Limitations Reach Overhead;Self Feeding;Dressing;Sleep;Lift   ? Examination-Participation Restrictions Laundry;Cleaning;Community Activity;Driving   ? Stability/Clinical Decision Making Evolving/Moderate complexity   ? Clinical Decision Making Moderate   ? Rehab Potential Good   ? PT Frequency 2x / week   ? PT Duration 8 weeks   ? PT Treatment/Interventions ADLs/Self Care Home Management;Aquatic Therapy;Moist Heat;Traction;Ultrasound;Cryotherapy;Parrafin;Functional mobility training;Neuromuscular re-education;Taping;Dry needling;Passive range of motion;Joint Manipulations;Spinal Manipulations;Patient/family education;Therapeutic exercise;Manual techniques;Therapeutic activities;Electrical Stimulation;Fluidtherapy;Iontophoresis '4mg'$ /ml Dexamethasone   ? PT Next Visit Plan Increase cervical ROM; postural remediation;   ? PT Home Exercise Plan no updates today   ? Consulted and Agree with Plan of Care Patient   ? ?  ?  ? ?  ? ? ?Patient will benefit from skilled therapeutic intervention in order to improve the following deficits and impairments:   Decreased endurance, Decreased mobility, Pain, Postural dysfunction, Impaired UE functional use, Impaired flexibility, Increased fascial restricitons, Decreased strength, Decreased activity tolerance, Decreased range of motion, Decreased scar mobility, Impaired tone, Improper body mechanics, Hypomobility ? ?Visit Diagnosis: ?Muscle weakness (generalized) ? ?Stiffness of right shoulder, not elsewhere classified ? ?Cervicalgia ? ? ? ? ?Problem List ?There are no problems to display for this patient. ? ?12:39 PM, 12/26/21 ?Etta Grandchild, PT, DPT ?Physical Therapist - [place] ?[phone] (Office) ? ? ?Demetress Tift  C, PT ?12/26/2021, 12:33 PM ? ?[place] ?[facility] ?[address] ?[place], [place], [address] ?Phone: [phone]   Fax:  [phone] ? ?Name: Whitney Mclean ?MRN: [id number] ?Date of Birth: Jan 06, 1966 ? ? ? ?

## 2022-01-09 ENCOUNTER — Ambulatory Visit: Payer: Medicare Other | Admitting: Physical Therapy

## 2022-01-09 ENCOUNTER — Encounter: Payer: Self-pay | Admitting: Physical Therapy

## 2022-01-09 DIAGNOSIS — M542 Cervicalgia: Secondary | ICD-10-CM

## 2022-01-09 DIAGNOSIS — M6281 Muscle weakness (generalized): Secondary | ICD-10-CM | POA: Diagnosis not present

## 2022-01-09 DIAGNOSIS — M79641 Pain in right hand: Secondary | ICD-10-CM

## 2022-01-09 DIAGNOSIS — M25611 Stiffness of right shoulder, not elsewhere classified: Secondary | ICD-10-CM

## 2022-01-09 NOTE — Therapy (Signed)
[place] ?[facility] ?[address] ?[place], [place], [address] ?Phone: [phone]   Fax:  [phone] ? ?Physical Therapy Treatment ? ?Patient Details  ?Name: Whitney Mclean ?MRN: [id number] ?Date of Birth: [birth year] ?No data recorded ? ?Encounter Date: 01/09/2022 ? ? PT End of Session - 01/09/22 1232   ? ? Visit Number 13   ? Number of Visits 26   ? Date for PT Re-Evaluation 01/23/22   ? Authorization Type BCBS   ? Authorization Time Period 12/12/21-01/23/22   ? Progress Note Due on Visit 20   ? PT Start Time 1146   ? PT Stop Time 6948   ? PT Time Calculation (min) 29 min   ? Activity Tolerance Patient tolerated treatment well;No increased pain   ? Behavior During Therapy [facility] for tasks assessed/performed   ? ?  ?  ? ?  ? ? ?Past Medical History:  ?Diagnosis Date  ? Oral cancer ([place])   ? Scleroderma ([place])   ? ? ?Past Surgical History:  ?Procedure Laterality Date  ? ABDOMINAL HYSTERECTOMY    ? ? ?There were no vitals filed for this visit. ? ? Subjective Assessment - 01/09/22 1230   ? ? Subjective Pt states she is doing well today. Denies pain. Would like to increase frequency of PT sessions.   ? Pertinent History Whitney Mclean is a 89yoF who comes to [facility] due to ongoing global stiffness in neck and shoulders in the setting of new scleroderma s/p radiation and chemotherapy 2/2 oral cancer (2016) and removal of Rt lymph nodes.   ? Currently in Pain? No/denies   ? ?  ?  ? ?  ? ? ? ?INTERVENTION: ?-NuStep level 2 x 5 min to promote protraction/retraction Seat 5 UE 12 ?-sidelying sustained release R upper trap x5 minutes ?-sidelying STM to release scar tissue adhesions of R cervical region x5 minutes ?-sidelying open book x15 each side  ?-leftward cervical rotation AROM with OP 2x10 ?-cervical rotation stretch 2x30sec bilat  ?-suboccipital release 2x60 seconds  ?-supine cervical retraction 2x10 ?-seated row BlueTB, 2x10 ? ? ? ? ?Clinical Impression: Pt  pleasant and motivated throughout session. POC continued including manual therapy to relieve tissue tension of R cervical spine and attempt to release adhesive fibrotic tissue for improved mobility and functional use. Pt performed strengthening exercises well, no pain reported. Pt and PT discussed increasing session frequency to 1x/week - pt to start this next week. She will continue to benefit from PT to progressively address tissue quality, posture and strength for improved function at home, completion of ADLs and safer driving. ? ? ? ? ? ? ? ? ? ? PT Short Term Goals - 09/15/19 1119   ? ?  ? PT SHORT TERM GOAL #1  ? Title Pt will be independent with HEP in order to improve strength and decrease pain in order to improve pain-free function at home and work.   ? Baseline 08/04/19 Completing HEP regularly withotu question or concern   ? Time 4   ? Period Weeks   ? Status Achieved   ? ?  ?  ? ?  ? ? ? ? PT Long Term Goals - 11/28/21 1241   ? ?  ? PT LONG TERM GOAL #1  ? Title Pt will decrease quick DASH score by at least 8% in order to demonstrate clinically significant reduction in disability.   ? Baseline 06/11/19 56.8%; 08/04/19 34%   ? Time  8   ? Period Weeks   ? Status Deferred   ?  ? PT LONG TERM GOAL #2  ? Title Patient will demonstrate full cervical ROM in order to drive safely   ? Baseline 11/17/19 R/L rotation 35/25 Sidebending 33/31 ext -6d; ; 09/19/20 R/L rotation 35/30 Sidebending 38/46 ext 10d; 04/04/21 R/L rotation 50/28 Sidebending 27/38 ext 10d;07/26/21 Rotation: L 30 deg R 40 deg Sidebending L 27 R 25; 11/28/21: F 40, E 35, LFlex R 24, LFlex L 20, Rot R 21, Rot L 18.   ? Time 8   ? Period Weeks   ? Status On-going   ? Target Date 01/23/22   ?  ? PT LONG TERM GOAL #3  ? Title Patient will demonstrate full R shoulder flex A/PROM in order to complete overhead ADLs   ? Baseline 11/17/19 AROM flex 120d PROM flex 145; PROM/AROM of ER and IR WNL; 09/19/20 AROM flex 140d PROM flex 167; 04/04/21 AROM flex 142d PROM  flex 165; 07/26/21: AROM flex 125d PROM flex 142d; 11/28/21: flexion 155 active/ 160 passive   ? Time 8   ? Period Weeks   ? Status Achieved   ?  ? PT LONG TERM GOAL #4  ? Title Patient will demonstrate 4+/5 gross periscapular and shoulder strength in order to complete heavy household ADLs and overhead ADLs   ? Baseline 11/17/19 R/L shoulder flex 3+/4+ abd 4-/5  ER 3+/4+  IR 4/5 T 3+/4+ Y 2+/3+; 09/19/20 R/L shoulder flex 4-/4+ abd 4/5  ER 4-/4+  IR 4+/5 T 4-/4+ Y 3+/4-; 04/04/21 R/L shoulder flex 4/5 abd 4/5  ER 4/4+  IR 5/5 T 4/4+ Y 4/4+: 07/26/21: R/L shoulder flex 4-/4 abd 4-/4 ER 4-/4-  IR 5/5  T4-/4 Y 4-/4... 11/28/21: flexion 4/4, abduction 4-/4, ER 3+/4-, IR 4/4, Y 3+/3+, T 3+/3+   ? Time 8   ? Period Weeks   ? Status On-going   ? Target Date 01/23/22   ? ?  ?  ? ?  ? ? ? ? ? ? ? ? Plan - 01/09/22 1232   ? ? Clinical Impression Statement Pt pleasant and motivated throughout session. POC continued including manual therapy to relieve tissue tension of R cervical spine and attempt to release adhesive fibrotic tissue for improved mobility and functional use. Pt performed strengthening exercises well, no pain reported. Pt and PT discussed increasing session frequency to 1x/week - pt to start this next week. She will continue to benefit from PT to progressively address tissue quality, posture and strength for improved function at home, completion of ADLs and safer driving.   ? Personal Factors and Comorbidities Age;Behavior Pattern;Comorbidity 1;Comorbidity 2;Past/Current Experience;Fitness;Sex;Time since onset of injury/illness/exacerbation   ? Comorbidities scleroderma, cancer   ? Examination-Activity Limitations Reach Overhead;Self Feeding;Dressing;Sleep;Lift   ? Examination-Participation Restrictions Laundry;Cleaning;Community Activity;Driving   ? Stability/Clinical Decision Making Evolving/Moderate complexity   ? Rehab Potential Good   ? PT Frequency 2x / week   ? PT Duration 8 weeks   ? PT Treatment/Interventions  ADLs/Self Care Home Management;Aquatic Therapy;Moist Heat;Traction;Ultrasound;Cryotherapy;Parrafin;Functional mobility training;Neuromuscular re-education;Taping;Dry needling;Passive range of motion;Joint Manipulations;Spinal Manipulations;Patient/family education;Therapeutic exercise;Manual techniques;Therapeutic activities;Electrical Stimulation;Fluidtherapy;Iontophoresis '4mg'$ /ml Dexamethasone   ? PT Next Visit Plan Increase cervical ROM; postural remediation;   ? PT Home Exercise Plan no updates today   ? Consulted and Agree with Plan of Care Patient   ? ?  ?  ? ?  ? ? ?Patient will benefit from skilled therapeutic intervention in order to improve the  following deficits and impairments:  Decreased endurance, Decreased mobility, Pain, Postural dysfunction, Impaired UE functional use, Impaired flexibility, Increased fascial restricitons, Decreased strength, Decreased activity tolerance, Decreased range of motion, Decreased scar mobility, Impaired tone, Improper body mechanics, Hypomobility ? ?Visit Diagnosis: ?Muscle weakness (generalized) ? ?Stiffness of right shoulder, not elsewhere classified ? ?Cervicalgia ? ?Pain in right hand ? ? ? ? ?Problem List ?There are no problems to display for this patient. ? ? ?Patrina Levering PT, DPT ? ? ?[place] ?[facility] ?[address] ?[place], [place], [address] ?Phone: [phone]   Fax:  [phone] ? ?Name: Whitney Mclean ?MRN: [id number] ?Date of Birth: 05-04-1966 ? ? ? ?

## 2022-01-16 ENCOUNTER — Ambulatory Visit: Payer: Medicare Other | Admitting: Physical Therapy

## 2022-01-16 ENCOUNTER — Encounter: Payer: Self-pay | Admitting: Physical Therapy

## 2022-01-16 DIAGNOSIS — M542 Cervicalgia: Secondary | ICD-10-CM

## 2022-01-16 DIAGNOSIS — M25611 Stiffness of right shoulder, not elsewhere classified: Secondary | ICD-10-CM

## 2022-01-16 DIAGNOSIS — M6281 Muscle weakness (generalized): Secondary | ICD-10-CM | POA: Diagnosis not present

## 2022-01-16 NOTE — Therapy (Signed)
[place] [facility] ?[address] ?[place], [place], [address] ?Phone: [phone]   Fax:  [phone] ? ?Physical Therapy Treatment ? ?Patient Details  ?Name: Whitney Mclean ?MRN: [id number] ?Date of Birth: [birth year] ?No data recorded ? ?Encounter Date: 01/16/2022 ? ? PT End of Session - 01/16/22 1704   ? ? Visit Number 14   ? Number of Visits 26   ? Date for PT Re-Evaluation 01/23/22   ? Authorization Type BCBS   ? Authorization Time Period 12/12/21-01/23/22   ? Progress Note Due on Visit 20   ? PT Start Time 1600   ? PT Stop Time 2641   ? PT Time Calculation (min) 45 min   ? Activity Tolerance Patient tolerated treatment well;No increased pain   ? Behavior During Therapy [facility] for tasks assessed/performed   ? ?  ?  ? ?  ? ? ?Past Medical History:  ?Diagnosis Date  ? Oral cancer ([place])   ? Scleroderma ([place])   ? ? ?Past Surgical History:  ?Procedure Laterality Date  ? ABDOMINAL HYSTERECTOMY    ? ? ?There were no vitals filed for this visit. ? ? Subjective Assessment - 01/16/22 1704   ? ? Subjective Pt states she is doing well today. Denies pain. No questions/concerns.   ? 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   ? Currently in Pain? No/denies   ? ?  ?  ? ?  ? ? ? ?INTERVENTION: ?-NuStep level 2 x 5 min to promote protraction/retraction Seat 5 UE 12 ?-sidelying STM and trigger point release to R upper trap x10 minutes ?-sidelying STM to release scar tissue adhesions of R cervical region x5 minutes ? ?- Cable row BUE 3x10 @ 15# ?- Cable lat pull down BUE 3x10 @ 20# ?- Cervical rotation stretch x60 seconds each direction ?- Seated posture correction (from "slumped" position) with scapular and cervical retraction x10 with 5 sec hold each rep ? ?  ?  ? ?  ?Clinical  Impression: Pt pleasant and motivated throughout session. POC included increased strengthening this date. Frequent cueing to correct posture to pt best ability. Session completed with manual therapy to decrease muscle tension of R upper trap and cervical musculature. She will continue to benefit from PT to progressively address tissue quality, posture and strength for improved function at home, completion of ADLs and safer driving. ? ? ? ? ? ? ? ? PT Short Term Goals - 09/15/19 1119   ? ?  ? PT SHORT TERM GOAL #1  ? Title Pt will be independent with HEP in order to improve strength and decrease pain in order to improve pain-free function at home and work.   ? Baseline 08/04/19 Completing HEP regularly withotu question or concern   ? Time 4   ? Period Weeks   ? Status Achieved   ? ?  ?  ? ?  ? ? ? ? PT Long Term Goals - 11/28/21 1241   ? ?  ? PT LONG TERM GOAL #1  ? Title Pt will decrease quick DASH score by at least 8% in order to demonstrate clinically significant reduction in disability.   ? Baseline 06/11/19 56.8%; 08/04/19 34%   ? Time 8   ? Period Weeks   ?  Status Deferred   ?  ? PT LONG TERM GOAL #2  ? Title Patient will demonstrate full cervical ROM in order to drive safely   ? Baseline 11/17/19 R/L rotation 35/25 Sidebending 33/31 ext -6d; ; 09/19/20 R/L rotation 35/30 Sidebending 38/46 ext 10d; 04/04/21 R/L rotation 50/28 Sidebending 27/38 ext 10d;07/26/21 Rotation: L 30 deg R 40 deg Sidebending L 27 R 25; 11/28/21: F 40, E 35, LFlex R 24, LFlex L 20, Rot R 21, Rot L 18.   ? Time 8   ? Period Weeks   ? Status On-going   ? Target Date 01/23/22   ?  ? PT LONG TERM GOAL #3  ? Title Patient will demonstrate full R shoulder flex A/PROM in order to complete overhead ADLs   ? Baseline 11/17/19 AROM flex 120d PROM flex 145; PROM/AROM of ER and IR WNL; 09/19/20 AROM flex 140d PROM flex 167; 04/04/21 AROM flex 142d PROM flex 165; 07/26/21: AROM flex 125d PROM flex 142d; 11/28/21: flexion 155 active/ 160 passive   ? Time 8   ?  Period Weeks   ? Status Achieved   ?  ? PT LONG TERM GOAL #4  ? Title Patient will demonstrate 4+/5 gross periscapular and shoulder strength in order to complete heavy household ADLs and overhead ADLs   ? Baseline 11/17/19 R/L shoulder flex 3+/4+ abd 4-/5  ER 3+/4+  IR 4/5 T 3+/4+ Y 2+/3+; 09/19/20 R/L shoulder flex 4-/4+ abd 4/5  ER 4-/4+  IR 4+/5 T 4-/4+ Y 3+/4-; 04/04/21 R/L shoulder flex 4/5 abd 4/5  ER 4/4+  IR 5/5 T 4/4+ Y 4/4+: 07/26/21: R/L shoulder flex 4-/4 abd 4-/4 ER 4-/4-  IR 5/5  T4-/4 Y 4-/4... 11/28/21: flexion 4/4, abduction 4-/4, ER 3+/4-, IR 4/4, Y 3+/3+, T 3+/3+   ? Time 8   ? Period Weeks   ? Status On-going   ? Target Date 01/23/22   ? ?  ?  ? ?  ? ? ? ? ? ? ? ? Plan - 01/17/22 1144   ? ? Clinical Impression Statement Pt pleasant and motivated throughout session. POC included increased strengthening this date. Frequent cueing to correct posture to pt best ability. Session completed with manual therapy to decrease muscle tension of R upper trap and cervical musculature. She will continue to benefit from PT to progressively address tissue quality, posture and strength for improved function at home, completion of ADLs and safer driving.   ? Personal Factors and Comorbidities Age;Behavior Pattern;Comorbidity 1;Comorbidity 2;Past/Current Experience;Fitness;Sex;Time since onset of injury/illness/exacerbation   ? Comorbidities scleroderma, cancer   ? Examination-Activity Limitations Reach Overhead;Self Feeding;Dressing;Sleep;Lift   ? Examination-Participation Restrictions Laundry;Cleaning;Community Activity;Driving   ? Stability/Clinical Decision Making Evolving/Moderate complexity   ? Rehab Potential Good   ? PT Frequency 1x / week   ? PT Duration 8 weeks   ? PT Treatment/Interventions ADLs/Self Care Home Management;Aquatic Therapy;Moist Heat;Traction;Ultrasound;Cryotherapy;Parrafin;Functional mobility training;Neuromuscular re-education;Taping;Dry needling;Passive range of motion;Joint  Manipulations;Spinal Manipulations;Patient/family education;Therapeutic exercise;Manual techniques;Therapeutic activities;Electrical Stimulation;Fluidtherapy;Iontophoresis '4mg'$ /ml Dexamethasone   ? PT Next Visit Plan Increase cervical ROM; postural remediation;   ? PT Home Exercise Plan no updates   ? Consulted and Agree with Plan of Care Patient   ? ?  ?  ? ?  ? ? ?Patient will benefit from skilled therapeutic intervention in order to improve the following deficits and impairments:  Decreased endurance, Decreased mobility, Pain, Postural dysfunction, Impaired UE functional use, Impaired flexibility, Increased fascial restricitons, Decreased strength, Decreased activity tolerance, Decreased range of  motion, Decreased scar mobility, Impaired tone, Improper body mechanics, Hypomobility ? ?Visit Diagnosis: ?Muscle weakness (generalized) ? ?Stiffness of right shoulder, not elsewhere classified ? ?Cervicalgia ? ? ? ? ?Problem List ?There are no problems to display for this patient. ? ? ?Patrina Levering PT, DPT ? ? ?[place] ?[facility] ?[address] ?[place], [place], [address] ?Phone: [phone]   Fax:  [phone] ? ?Name: Whitney Mclean ?MRN: [id number] ?Date of Birth: 1966-06-27 ? ? ? ?

## 2022-01-23 ENCOUNTER — Encounter: Payer: Self-pay | Admitting: Physical Therapy

## 2022-01-23 ENCOUNTER — Ambulatory Visit: Payer: Medicare Other | Attending: Internal Medicine | Admitting: Physical Therapy

## 2022-01-23 DIAGNOSIS — M542 Cervicalgia: Secondary | ICD-10-CM | POA: Diagnosis present

## 2022-01-23 DIAGNOSIS — M6281 Muscle weakness (generalized): Secondary | ICD-10-CM | POA: Diagnosis present

## 2022-01-23 DIAGNOSIS — M25611 Stiffness of right shoulder, not elsewhere classified: Secondary | ICD-10-CM | POA: Insufficient documentation

## 2022-01-23 DIAGNOSIS — M79641 Pain in right hand: Secondary | ICD-10-CM | POA: Diagnosis present

## 2022-01-23 NOTE — Therapy (Signed)
?[facility] ?[address] ?[place], [place], [address] ?Phone: [phone]   Fax:  [phone] ? ?Physical Therapy Treatment ? ?Patient Details  ?Name: Whitney Mclean ?MRN: [id number] ?Date of Birth: [birth year] ?No data recorded ? ?Encounter Date: 01/23/2022 ? ? PT End of Session - 01/23/22 1226   ? ? Visit Number 15   ? Number of Visits 26   ? Date for PT Re-Evaluation 01/23/22   ? Authorization Type BCBS   ? Authorization Time Period 12/12/21-01/23/22   ? Progress Note Due on Visit 20   ? PT Start Time 1132   ? PT Stop Time 2836   ? PT Time Calculation (min) 43 min   ? Activity Tolerance Patient tolerated treatment well;No increased pain   ? Behavior During Therapy [facility] for tasks assessed/performed   ? ?  ?  ? ?  ? ? ?Past Medical History:  ?Diagnosis Date  ? Oral cancer ([place])   ? Scleroderma ([place])   ? ? ?Past Surgical History:  ?Procedure Laterality Date  ? ABDOMINAL HYSTERECTOMY    ? ? ?There were no vitals filed for this visit. ? ? Subjective Assessment - 01/23/22 1136   ? ? Subjective Pt states she is doing well today. No new reports since last session.   ? 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   ? Currently in Pain? No/denies   ? ?  ?  ? ?  ? ? ? ? ?INTERVENTION: ?-NuStep level 3 x 5 min to promote protraction/retraction Seat 7 UE 11 ?-sidelying STM and trigger point release to R upper trap and cervical paraspinals x12 minutes ?-sidelying STM to release scar tissue adhesions of R cervical region x5 minutes ?  ?-seated stretches: R upper trap, levator scap, cervical rotation and pectorals ? ?-seated thoracic and cervical extension, active with UE push off thighs, 2x10 with 5 sec hold  ? -VC to look to ceiling  ?-seated cervical retraction,  2x10 ?-cable row BUE 2x10 @ 15# ? ? ?  ?  ?  ?Clinical Impression: Pt is pleasant and motivated throughout session. Manual therapy addressing muscle tension soft tissue adhesions was performed. Pt has difficulty achieving cervical and thoracic extension however states it feels good to perform. Pt would benefit from endurance training of the cervical extensors and retractors in order to hold head for longer periods of time (pt reports walking distance is limited by inability to hold her head up for prolonged time). She will continue to benefit from PT to progressively address tissue quality, posture and strength for improved function at home, completion of ADLs and safer driving. ? ? ? ? ? ? ? ? PT Short Term Goals - 09/15/19 1119   ? ?  ? PT SHORT TERM GOAL #1  ? Title Pt will be independent with HEP in order to improve strength and decrease pain in order to improve pain-free function at home and work.   ? Baseline 08/04/19 Completing HEP regularly withotu question or concern   ? Time 4   ? Period Weeks   ? Status Achieved   ? ?  ?  ? ?  ? ? ? ? PT Long Term Goals - 11/28/21 1241   ? ?  ? PT LONG TERM GOAL #  1  ? Title Pt will decrease quick DASH score by at least 8% in order to demonstrate clinically significant reduction in disability.   ? Baseline 06/11/19 56.8%; 08/04/19 34%   ? Time 8   ? Period Weeks   ? Status Deferred   ?  ? PT LONG TERM GOAL #2  ? Title Patient will demonstrate full cervical ROM in order to drive safely   ? Baseline 11/17/19 R/L rotation 35/25 Sidebending 33/31 ext -6d; ; 09/19/20 R/L rotation 35/30 Sidebending 38/46 ext 10d; 04/04/21 R/L rotation 50/28 Sidebending 27/38 ext 10d;07/26/21 Rotation: L 30 deg R 40 deg Sidebending L 27 R 25; 11/28/21: F 40, E 35, LFlex R 24, LFlex L 20, Rot R 21, Rot L 18.   ? Time 8   ? Period Weeks   ? Status On-going   ? Target Date 01/23/22   ?  ? PT LONG TERM GOAL #3  ? Title Patient will demonstrate full R shoulder flex A/PROM in order to complete overhead ADLs    ? Baseline 11/17/19 AROM flex 120d PROM flex 145; PROM/AROM of ER and IR WNL; 09/19/20 AROM flex 140d PROM flex 167; 04/04/21 AROM flex 142d PROM flex 165; 07/26/21: AROM flex 125d PROM flex 142d; 11/28/21: flexion 155 active/ 160 passive   ? Time 8   ? Period Weeks   ? Status Achieved   ?  ? PT LONG TERM GOAL #4  ? Title Patient will demonstrate 4+/5 gross periscapular and shoulder strength in order to complete heavy household ADLs and overhead ADLs   ? Baseline 11/17/19 R/L shoulder flex 3+/4+ abd 4-/5  ER 3+/4+  IR 4/5 T 3+/4+ Y 2+/3+; 09/19/20 R/L shoulder flex 4-/4+ abd 4/5  ER 4-/4+  IR 4+/5 T 4-/4+ Y 3+/4-; 04/04/21 R/L shoulder flex 4/5 abd 4/5  ER 4/4+  IR 5/5 T 4/4+ Y 4/4+: 07/26/21: R/L shoulder flex 4-/4 abd 4-/4 ER 4-/4-  IR 5/5  T4-/4 Y 4-/4... 11/28/21: flexion 4/4, abduction 4-/4, ER 3+/4-, IR 4/4, Y 3+/3+, T 3+/3+   ? Time 8   ? Period Weeks   ? Status On-going   ? Target Date 01/23/22   ? ?  ?  ? ?  ? ? ? ? ? ? ? ? Plan - 01/23/22 1227   ? ? Clinical Impression Statement Pt is pleasant and motivated throughout session. Manual therapy addressing muscle tension soft tissue adhesions was performed. Pt has difficulty achieving cervical and thoracic extension however states it feels good to perform. Pt would benefit from endurance training of the cervical extensors and retractors in order to hold head for longer periods of time (pt reports walking distance is limited by inability to hold her head up for prolonged time). She will continue to benefit from PT to progressively address tissue quality, posture and strength for improved function at home, completion of ADLs and safer driving.   ? Personal Factors and Comorbidities Age;Behavior Pattern;Comorbidity 1;Comorbidity 2;Past/Current Experience;Fitness;Sex;Time since onset of injury/illness/exacerbation   ? Comorbidities scleroderma, cancer   ? Examination-Activity Limitations Reach Overhead;Self Feeding;Dressing;Sleep;Lift   ? Examination-Participation  Restrictions Laundry;Cleaning;Community Activity;Driving   ? Stability/Clinical Decision Making Evolving/Moderate complexity   ? Rehab Potential Good   ? PT Frequency 1x / week   ? PT Duration 8 weeks   ? PT Treatment/Interventions ADLs/Self Care Home Management;Aquatic Therapy;Moist Heat;Traction;Ultrasound;Cryotherapy;Parrafin;Functional mobility training;Neuromuscular re-education;Taping;Dry needling;Passive range of motion;Joint Manipulations;Spinal Manipulations;Patient/family education;Therapeutic exercise;Manual techniques;Therapeutic activities;Electrical Stimulation;Fluidtherapy;Iontophoresis '4mg'$ /ml Dexamethasone   ? PT Next Visit Plan Increase  cervical ROM; postural remediation;   ? PT Home Exercise Plan no updates   ? Consulted and Agree with Plan of Care Patient   ? ?  ?  ? ?  ? ? ?Patient will benefit from skilled therapeutic intervention in order to improve the following deficits and impairments:  Decreased endurance, Decreased mobility, Pain, Postural dysfunction, Impaired UE functional use, Impaired flexibility, Increased fascial restricitons, Decreased strength, Decreased activity tolerance, Decreased range of motion, Decreased scar mobility, Impaired tone, Improper body mechanics, Hypomobility ? ?Visit Diagnosis: ?Muscle weakness (generalized) ? ?Stiffness of right shoulder, not elsewhere classified ? ?Cervicalgia ? ?Pain in right hand ? ? ? ? ?Problem List ?There are no problems to display for this patient. ? ? ?Patrina Levering PT, DPT ? ? ?[place] [place] ?[facility] ?[address] ?[place], [place], [address] ?Phone: [phone]   Fax:  [phone] ? ?Name: Whitney Mclean ?MRN: [id number] ?Date of Birth: [birth year] ? ? ? ?

## 2022-01-29 ENCOUNTER — Encounter: Payer: Self-pay | Admitting: Physical Therapy

## 2022-01-29 ENCOUNTER — Ambulatory Visit: Payer: Medicare Other | Admitting: Physical Therapy

## 2022-01-29 DIAGNOSIS — M6281 Muscle weakness (generalized): Secondary | ICD-10-CM | POA: Diagnosis not present

## 2022-01-29 DIAGNOSIS — M25611 Stiffness of right shoulder, not elsewhere classified: Secondary | ICD-10-CM

## 2022-01-29 DIAGNOSIS — M79641 Pain in right hand: Secondary | ICD-10-CM

## 2022-01-29 DIAGNOSIS — M542 Cervicalgia: Secondary | ICD-10-CM

## 2022-01-29 NOTE — Therapy (Signed)
?[facility] ?[address] ?[place], [place], [address] ?Phone: [phone]   Fax:  [phone] ? ?Physical Therapy Treatment ? ?Patient Details  ?Name: Whitney Mclean ?MRN: [id number] ?Date of Birth: 25-Jul-1966 ?No data recorded ? ?Encounter Date: 01/29/2022 ? ? PT End of Session - 01/29/22 1006   ? ? Visit Number 16   ? Number of Visits 26   ? Date for PT Re-Evaluation 04/23/22   ? Authorization Type BCBS   ? Authorization Time Period 01/29/22 - 04/23/22   ? Authorization - Visit Number 6   ? Authorization - Number of Visits 10   ? Progress Note Due on Visit 30   ? PT Start Time 1000   ? PT Stop Time 5809   ? PT Time Calculation (min) 40 min   ? Activity Tolerance Patient tolerated treatment well;No increased pain   ? Behavior During Therapy [facility] for tasks assessed/performed   ? ?  ?  ? ?  ? ? ?Past Medical History:  ?Diagnosis Date  ? Oral cancer ([place])   ? Scleroderma ([place])   ? ? ?Past Surgical History:  ?Procedure Laterality Date  ? ABDOMINAL HYSTERECTOMY    ? ? ?There were no vitals filed for this visit. ? ? Subjective Assessment - 01/29/22 1005   ? ? Subjective Pt is doing well. Completing HEP regularly. She had a fall off the porch Sunday, but fell into her iris bush, did not hit her head, no pain from this.   ? Pertinent History Whitney Mclean is a 19yoF who comes to [place] due to ongoing global stiffness in neck and shoulders in the setting of new scleroderma s/p radiation and chemotherapy 2/2 oral cancer (2016) and removal of Rt lymph nodes.   ? Limitations Sitting;Writing;House hold activities;Lifting   ? How long can you sit comfortably? 30   ? How long can you stand comfortably? unlimited   ? How long can you walk comfortably? unlimited   ? Diagnostic tests None on shoulder   ? Patient Stated Goals Increase shoulder strength and mobility   ? Pain Onset More than a month ago   ? ?  ?  ? ?  ? ? ?INTERVENTION: ?-NuStep level 3 x 5 min to  promote protraction/retraction Seat 7 UE 11 ? ?- Lat pulldown 25# 3x 10 with cuing for eccentric control (attempted 35# unable) ? ?- Squat with overhead dowel maintained 3x 8 with cuing for continued overhead as much as possible ? ?- BUE flex 2# seated 2x 6/5 with TC of back against wall ? ?Manual Therapy ? to reduce pain and tissue tension, improve range of motion, neuromodulation, in order to promote improved ability to complete functional activities. ?- Passive cervical rotation and lateral bending to L , and ext off edge of mat table with support sustained holds (able to get upper cervical spine off mat table) ?STM to R side of ant neck (hardened area) ?In sidelying for patient comfort ?  ?  ? ? ? ? ? ? ? ? ? ? ? ? ? ? ? ? ? ? ? ? ? ? ? ? ? ? PT Education - 01/29/22 1005   ? ? Education Details therex form/technique   ? Person(s) Educated Patient   ? Methods Explanation;Demonstration;Verbal cues   ? Comprehension Verbalized understanding;Returned demonstration;Verbal cues required   ? ?  ?  ? ?  ? ? ? PT Short Term Goals - 09/15/19 1119   ? ?  ?  PT SHORT TERM GOAL #1  ? Title Pt will be independent with HEP in order to improve strength and decrease pain in order to improve pain-free function at home and work.   ? Baseline 08/04/19 Completing HEP regularly withotu question or concern   ? Time 4   ? Period Weeks   ? Status Achieved   ? ?  ?  ? ?  ? ? ? ? PT Long Term Goals - 11/28/21 1241   ? ?  ? PT LONG TERM GOAL #1  ? Title Pt will decrease quick DASH score by at least 8% in order to demonstrate clinically significant reduction in disability.   ? Baseline 06/11/19 56.8%; 08/04/19 34%   ? Time 8   ? Period Weeks   ? Status Deferred   ?  ? PT LONG TERM GOAL #2  ? Title Patient will demonstrate full cervical ROM in order to drive safely   ? Baseline 11/17/19 R/L rotation 35/25 Sidebending 33/31 ext -6d; ; 09/19/20 R/L rotation 35/30 Sidebending 38/46 ext 10d; 04/04/21 R/L rotation 50/28 Sidebending 27/38 ext  10d;07/26/21 Rotation: L 30 deg R 40 deg Sidebending L 27 R 25; 11/28/21: F 40, E 35, LFlex R 24, LFlex L 20, Rot R 21, Rot L 18.   ? Time 8   ? Period Weeks   ? Status On-going   ? Target Date 01/23/22   ?  ? PT LONG TERM GOAL #3  ? Title Patient will demonstrate full R shoulder flex A/PROM in order to complete overhead ADLs   ? Baseline 11/17/19 AROM flex 120d PROM flex 145; PROM/AROM of ER and IR WNL; 09/19/20 AROM flex 140d PROM flex 167; 04/04/21 AROM flex 142d PROM flex 165; 07/26/21: AROM flex 125d PROM flex 142d; 11/28/21: flexion 155 active/ 160 passive   ? Time 8   ? Period Weeks   ? Status Achieved   ?  ? PT LONG TERM GOAL #4  ? Title Patient will demonstrate 4+/5 gross periscapular and shoulder strength in order to complete heavy household ADLs and overhead ADLs   ? Baseline 11/17/19 R/L shoulder flex 3+/4+ abd 4-/5  ER 3+/4+  IR 4/5 T 3+/4+ Y 2+/3+; 09/19/20 R/L shoulder flex 4-/4+ abd 4/5  ER 4-/4+  IR 4+/5 T 4-/4+ Y 3+/4-; 04/04/21 R/L shoulder flex 4/5 abd 4/5  ER 4/4+  IR 5/5 T 4/4+ Y 4/4+: 07/26/21: R/L shoulder flex 4-/4 abd 4-/4 ER 4-/4-  IR 5/5  T4-/4 Y 4-/4... 11/28/21: flexion 4/4, abduction 4-/4, ER 3+/4-, IR 4/4, Y 3+/3+, T 3+/3+   ? Time 8   ? Period Weeks   ? Status On-going   ? Target Date 01/23/22   ? ?  ?  ? ?  ? ? ? ? ? ? ? ? Plan - 01/29/22 1034   ? ? Clinical Impression Statement PT continued therex progression for maintenance of mobility with success. [place] is motivated throughout session with good carry over of all multimodal cuing. Pt requires frequent rest breaks throughout session, but is able to complete all planned interventions. PT will continue progression as able.   ? Personal Factors and Comorbidities Age;Behavior Pattern;Comorbidity 1;Comorbidity 2;Past/Current Experience;Fitness;Sex;Time since onset of injury/illness/exacerbation   ? Comorbidities scleroderma, cancer   ? Examination-Activity Limitations Reach Overhead;Self Feeding;Dressing;Sleep;Lift   ? Examination-Participation  Restrictions Laundry;Cleaning;Community Activity;Driving   ? Stability/Clinical Decision Making Evolving/Moderate complexity   ? Clinical Decision Making Moderate   ? Rehab Potential Good   ? PT Frequency  1x / week   ? PT Duration 8 weeks   ? PT Treatment/Interventions ADLs/Self Care Home Management;Aquatic Therapy;Moist Heat;Traction;Ultrasound;Cryotherapy;Parrafin;Functional mobility training;Neuromuscular re-education;Taping;Dry needling;Passive range of motion;Joint Manipulations;Spinal Manipulations;Patient/family education;Therapeutic exercise;Manual techniques;Therapeutic activities;Electrical Stimulation;Fluidtherapy;Iontophoresis '4mg'$ /ml Dexamethasone   ? PT Next Visit Plan Increase cervical ROM; postural remediation;   ? PT Home Exercise Plan no updates   ? Consulted and Agree with Plan of Care Patient   ? ?  ?  ? ?  ? ? ?Patient will benefit from skilled therapeutic intervention in order to improve the following deficits and impairments:  Decreased endurance, Decreased mobility, Pain, Postural dysfunction, Impaired UE functional use, Impaired flexibility, Increased fascial restricitons, Decreased strength, Decreased activity tolerance, Decreased range of motion, Decreased scar mobility, Impaired tone, Improper body mechanics, Hypomobility ? ?Visit Diagnosis: ?Muscle weakness (generalized) ? ?Stiffness of right shoulder, not elsewhere classified ? ?Cervicalgia ? ?Pain in right hand ? ? ? ? ?Problem List ?There are no problems to display for this patient. ? ?Durwin Reges DPT ?Durwin Reges, PT ?01/29/2022, 10:48 AM ? ?[place] ?[facility] ?[address] ?[place], [place], [address] ?Phone: [phone]   Fax:  [phone] ? ?Name: Whitney Mclean ?MRN: [id number] ?Date of Birth: 08/05/1966 ? ? ? ?

## 2022-02-06 ENCOUNTER — Ambulatory Visit: Payer: Medicare Other | Admitting: Physical Therapy

## 2022-02-06 ENCOUNTER — Encounter: Payer: Self-pay | Admitting: Physical Therapy

## 2022-02-06 DIAGNOSIS — M6281 Muscle weakness (generalized): Secondary | ICD-10-CM

## 2022-02-06 NOTE — Therapy (Signed)
[place] ?[facility] ?[address] ?[place], [place], [address] ?Phone: [phone]   Fax:  [phone] ? ?Physical Therapy Treatment ? ?Patient Details  ?Name: Whitney Mclean ?MRN: [id number] ?Date of Birth: Dec 24, 1965 ?No data recorded ? ?Encounter Date: 02/06/2022 ? ? PT End of Session - 02/06/22 1400   ? ? Visit Number 17   ? Number of Visits 26   ? Date for PT Re-Evaluation 04/23/22   ? Authorization Type BCBS   ? Authorization Time Period 01/29/22 - 04/23/22   ? Authorization - Visit Number 7   ? Authorization - Number of Visits 10   ? Progress Note Due on Visit 30   ? PT Start Time 1346   ? PT Stop Time 1425   ? PT Time Calculation (min) 39 min   ? Activity Tolerance Patient tolerated treatment well;No increased pain   ? Behavior During Therapy [facility] for tasks assessed/performed   ? ?  ?  ? ?  ? ? ?Past Medical History:  ?Diagnosis Date  ? Oral cancer ([place])   ? Scleroderma ([place])   ? ? ?Past Surgical History:  ?Procedure Laterality Date  ? ABDOMINAL HYSTERECTOMY    ? ? ?There were no vitals filed for this visit. ? ? Subjective Assessment - 02/06/22 1352   ? ? Subjective Pt is doing well. Has some continued pain on the top of her foot from her fall. Completing HEP.   ? Pertinent History Whitney Mclean is a 43yoF who comes to [place] due to ongoing global stiffness in neck and shoulders in the setting of new scleroderma s/p radiation and chemotherapy 2/2 oral cancer (2016) and removal of Rt lymph nodes.   ? Limitations Sitting;Writing;House hold activities;Lifting   ? How long can you sit comfortably? 30   ? How long can you stand comfortably? unlimited   ? How long can you walk comfortably? unlimited   ? Diagnostic tests None on shoulder   ? Patient Stated Goals Increase shoulder strength and mobility   ? Pain Onset More than a month ago   ? ?  ?  ? ?  ? ? ? ?INTERVENTION: ?-NuStep level 3 x 5 min to promote protraction/retraction Seat 7 UE 11 ?  ?- Lat  pulldown 25# 3x 10 with cuing for eccentric control (attempted 35# unable) ?  ?- Y on wall x12; 1# DB 2x 10 with good carry over of cuing for set up ? ?- Squat with overhead press 2# DB each hand 3x 8 with excellent carry over of cuing for full stand ?  ?Manual Therapy ? to reduce pain and tissue tension, improve range of motion, neuromodulation, in order to promote improved ability to complete functional activities. ?- Passive cervical rotation and lateral bending to L , and ext off edge of mat table with support sustained holds (able to get upper cervical spine off mat table) ?STM to R side of ant neck (hardened area) ?In sidelying for patient comfort ? ? ? ? ? ? ? ? ? ? ? ? ? ? ? ? ? ? ? ? ? ? ? ? ? PT Education - 02/06/22 1359   ? ? Education Details therex form/technique   ? Person(s) Educated Patient   ? Methods Explanation;Demonstration;Verbal cues   ? Comprehension Verbal cues required;Verbalized understanding;Returned demonstration   ? ?  ?  ? ?  ? ? ? PT Short Term Goals - 09/15/19 1119   ? ?  ?  PT SHORT TERM GOAL #1  ? Title Pt will be independent with HEP in order to improve strength and decrease pain in order to improve pain-free function at home and work.   ? Baseline 08/04/19 Completing HEP regularly withotu question or concern   ? Time 4   ? Period Weeks   ? Status Achieved   ? ?  ?  ? ?  ? ? ? ? PT Long Term Goals - 11/28/21 1241   ? ?  ? PT LONG TERM GOAL #1  ? Title Pt will decrease quick DASH score by at least 8% in order to demonstrate clinically significant reduction in disability.   ? Baseline 06/11/19 56.8%; 08/04/19 34%   ? Time 8   ? Period Weeks   ? Status Deferred   ?  ? PT LONG TERM GOAL #2  ? Title Patient will demonstrate full cervical ROM in order to drive safely   ? Baseline 11/17/19 R/L rotation 35/25 Sidebending 33/31 ext -6d; ; 09/19/20 R/L rotation 35/30 Sidebending 38/46 ext 10d; 04/04/21 R/L rotation 50/28 Sidebending 27/38 ext 10d;07/26/21 Rotation: L 30 deg R 40 deg Sidebending L  27 R 25; 11/28/21: F 40, E 35, LFlex R 24, LFlex L 20, Rot R 21, Rot L 18.   ? Time 8   ? Period Weeks   ? Status On-going   ? Target Date 01/23/22   ?  ? PT LONG TERM GOAL #3  ? Title Patient will demonstrate full R shoulder flex A/PROM in order to complete overhead ADLs   ? Baseline 11/17/19 AROM flex 120d PROM flex 145; PROM/AROM of ER and IR WNL; 09/19/20 AROM flex 140d PROM flex 167; 04/04/21 AROM flex 142d PROM flex 165; 07/26/21: AROM flex 125d PROM flex 142d; 11/28/21: flexion 155 active/ 160 passive   ? Time 8   ? Period Weeks   ? Status Achieved   ?  ? PT LONG TERM GOAL #4  ? Title Patient will demonstrate 4+/5 gross periscapular and shoulder strength in order to complete heavy household ADLs and overhead ADLs   ? Baseline 11/17/19 R/L shoulder flex 3+/4+ abd 4-/5  ER 3+/4+  IR 4/5 T 3+/4+ Y 2+/3+; 09/19/20 R/L shoulder flex 4-/4+ abd 4/5  ER 4-/4+  IR 4+/5 T 4-/4+ Y 3+/4-; 04/04/21 R/L shoulder flex 4/5 abd 4/5  ER 4/4+  IR 5/5 T 4/4+ Y 4/4+: 07/26/21: R/L shoulder flex 4-/4 abd 4-/4 ER 4-/4-  IR 5/5  T4-/4 Y 4-/4... 11/28/21: flexion 4/4, abduction 4-/4, ER 3+/4-, IR 4/4, Y 3+/3+, T 3+/3+   ? Time 8   ? Period Weeks   ? Status On-going   ? Target Date 01/23/22   ? ?  ?  ? ?  ? ? ? ? ? ? ? ? Plan - 02/06/22 1408   ? ? Clinical Impression Statement PT continued therex progression for maintaining mobility with success. Pt is able to increase resistance in overhead activity. Patient is motivated throughout session with excellent compliance to all cuing. PT will continue progression as able.   ? Personal Factors and Comorbidities Age;Behavior Pattern;Comorbidity 1;Comorbidity 2;Past/Current Experience;Fitness;Sex;Time since onset of injury/illness/exacerbation   ? Comorbidities scleroderma, cancer   ? Examination-Activity Limitations Reach Overhead;Self Feeding;Dressing;Sleep;Lift   ? Examination-Participation Restrictions Laundry;Cleaning;Community Activity;Driving   ? Stability/Clinical Decision Making  Evolving/Moderate complexity   ? Clinical Decision Making Moderate   ? Rehab Potential Good   ? PT Frequency 1x / week   ? PT Duration 8  weeks   ? PT Treatment/Interventions ADLs/Self Care Home Management;Aquatic Therapy;Moist Heat;Traction;Ultrasound;Cryotherapy;Parrafin;Functional mobility training;Neuromuscular re-education;Taping;Dry needling;Passive range of motion;Joint Manipulations;Spinal Manipulations;Patient/family education;Therapeutic exercise;Manual techniques;Therapeutic activities;Electrical Stimulation;Fluidtherapy;Iontophoresis '4mg'$ /ml Dexamethasone   ? PT Next Visit Plan Increase cervical ROM; postural remediation;   ? PT Home Exercise Plan no updates   ? Consulted and Agree with Plan of Care Patient   ? ?  ?  ? ?  ? ? ?Patient will benefit from skilled therapeutic intervention in order to improve the following deficits and impairments:  Decreased endurance, Decreased mobility, Pain, Postural dysfunction, Impaired UE functional use, Impaired flexibility, Increased fascial restricitons, Decreased strength, Decreased activity tolerance, Decreased range of motion, Decreased scar mobility, Impaired tone, Improper body mechanics, Hypomobility ? ?Visit Diagnosis: ?Muscle weakness (generalized) ? ? ? ? ?Problem List ?There are no problems to display for this patient. ? ?Durwin Reges DPT ?Durwin Reges, PT ?02/06/2022, 2:31 PM ? ?[place] ?[place] [facility] ?[address] ?[place], [place], [address] ?Phone: [phone]   Fax:  [phone] ? ?Name: Whitney Mclean ?MRN: [id number] ?Date of Birth: 1966-02-24 ? ? ? ?

## 2022-02-13 ENCOUNTER — Ambulatory Visit: Payer: Medicare Other

## 2022-02-13 ENCOUNTER — Encounter: Payer: Self-pay | Admitting: Physical Therapy

## 2022-02-13 DIAGNOSIS — M542 Cervicalgia: Secondary | ICD-10-CM

## 2022-02-13 DIAGNOSIS — M6281 Muscle weakness (generalized): Secondary | ICD-10-CM | POA: Diagnosis not present

## 2022-02-13 DIAGNOSIS — M25611 Stiffness of right shoulder, not elsewhere classified: Secondary | ICD-10-CM

## 2022-02-13 NOTE — Therapy (Signed)
[place] [facility] [address], [place], [address] Phone: [phone]   Fax:  [phone]  Physical Therapy Treatment  Patient Details  Name: Whitney Mclean MRN: [id number] Date of Birth: 04/20/1966 No data recorded  Encounter Date: 02/13/2022   PT End of Session - 02/13/22 1355     Visit Number 18    Number of Visits 26    Date for PT Re-Evaluation 04/23/22    Authorization Type BCBS    Authorization Time Period 01/29/22 - 04/23/22    Authorization - Visit Number 8    Authorization - Number of Visits 10    Progress Note Due on Visit 30    PT Start Time 2774    PT Stop Time 1430    PT Time Calculation (min) 41 min    Activity Tolerance Patient tolerated treatment well;No increased pain    Behavior During Therapy WFL for tasks assessed/performed             Past Medical History:  Diagnosis Date   Oral cancer ([place])    Scleroderma ([place])     Past Surgical History:  Procedure Laterality Date   ABDOMINAL HYSTERECTOMY      There were no vitals filed for this visit.   Subjective Assessment - 02/13/22 1353     Subjective Pt reports she still has some foot pain but otherwise no new concerns or complaints.    Pertinent History Hassie Mandt is a 4yoF who comes to [place] due to ongoing global stiffness in neck and shoulders in the setting of new scleroderma s/p radiation and chemotherapy 2/2 oral cancer (2016) and removal of Rt lymph nodes.    Limitations Sitting;Writing;House hold activities;Lifting    How long can you sit comfortably? 30    How long can you stand comfortably? unlimited    How long can you walk comfortably? unlimited    Diagnostic tests None on shoulder    Patient Stated Goals Increase shoulder strength and mobility    Currently in Pain? No/denies    Pain Onset More than a month ago            There.ex:   NuStep level 3 x 5 min to promote protraction/retraction Seat 7 UE  11   Lat pulldown 25# 3x 10 with cuing for eccentric control   Standing low row/shoulder extension with RTB tied to PVC pipe dowel for grip: 3x8 with VC's for form/technique    Squat with overhead press 2# DB each hand 3x10    Frequent seated rest breaks needed between exercises and sets due to muscular fatigue.    Manual:  To reduce pain and tissue tension, improve range of motion, neuromodulation, in order to promote improved ability to complete functional activities.  STM to R side of ant neck (hardened area) In sidelying for patient comfort for 8 minutes    PT Education - 02/13/22 1354     Education Details form/technique with exercise.    Person(s) Educated Patient    Methods Explanation;Demonstration;Verbal cues    Comprehension Verbalized understanding;Returned demonstration;Verbal cues required              PT Short Term Goals - 09/15/19 1119       PT SHORT TERM GOAL #1   Title Pt will be independent with HEP in order to improve strength and decrease pain in order to improve pain-free function at home and work.    Baseline 08/04/19 Completing HEP  regularly withotu question or concern    Time 4    Period Weeks    Status Achieved               PT Long Term Goals - 11/28/21 1241       PT LONG TERM GOAL #1   Title Pt will decrease quick DASH score by at least 8% in order to demonstrate clinically significant reduction in disability.    Baseline 06/11/19 56.8%; 08/04/19 34%    Time 8    Period Weeks    Status Deferred      PT LONG TERM GOAL #2   Title Patient will demonstrate full cervical ROM in order to drive safely    Baseline 11/17/19 R/L rotation 35/25 Sidebending 33/31 ext -6d; ; 09/19/20 R/L rotation 35/30 Sidebending 38/46 ext 10d; 04/04/21 R/L rotation 50/28 Sidebending 27/38 ext 10d;07/26/21 Rotation: L 30 deg R 40 deg Sidebending L 27 R 25; 11/28/21: F 40, E 35, LFlex R 24, LFlex L 20, Rot R 21, Rot L 18.    Time 8    Period Weeks    Status  On-going    Target Date 01/23/22      PT LONG TERM GOAL #3   Title Patient will demonstrate full R shoulder flex A/PROM in order to complete overhead ADLs    Baseline 11/17/19 AROM flex 120d PROM flex 145; PROM/AROM of ER and IR WNL; 09/19/20 AROM flex 140d PROM flex 167; 04/04/21 AROM flex 142d PROM flex 165; 07/26/21: AROM flex 125d PROM flex 142d; 11/28/21: flexion 155 active/ 160 passive    Time 8    Period Weeks    Status Achieved      PT LONG TERM GOAL #4   Title Patient will demonstrate 4+/5 gross periscapular and shoulder strength in order to complete heavy household ADLs and overhead ADLs    Baseline 11/17/19 R/L shoulder flex 3+/4+ abd 4-/5  ER 3+/4+  IR 4/5 T 3+/4+ Y 2+/3+; 09/19/20 R/L shoulder flex 4-/4+ abd 4/5  ER 4-/4+  IR 4+/5 T 4-/4+ Y 3+/4-; 04/04/21 R/L shoulder flex 4/5 abd 4/5  ER 4/4+  IR 5/5 T 4/4+ Y 4/4+: 07/26/21: R/L shoulder flex 4-/4 abd 4-/4 ER 4-/4-  IR 5/5  T4-/4 Y 4-/4... 11/28/21: flexion 4/4, abduction 4-/4, ER 3+/4-, IR 4/4, Y 3+/3+, T 3+/3+    Time 8    Period Weeks    Status On-going    Target Date 01/23/22                   Plan - 02/13/22 1404     Clinical Impression Statement Continuing PT POC with progression of therex and mobility. Pt is tolerating increased loads with overhead activity but pt does require frequent seated rest breaks between sets and exercises due to muscular fatigue. Pt will continue to benefit from skilled PT services to progress mobility, posture, and strength as able.    Personal Factors and Comorbidities Age;Behavior Pattern;Comorbidity 1;Comorbidity 2;Past/Current Experience;Fitness;Sex;Time since onset of injury/illness/exacerbation    Comorbidities scleroderma, cancer    Examination-Activity Limitations Reach Overhead;Self Feeding;Dressing;Sleep;Lift    Examination-Participation Restrictions Laundry;Cleaning;Community Activity;Driving    Stability/Clinical Decision Making Evolving/Moderate complexity    Rehab Potential  Good    PT Frequency 1x / week    PT Duration 8 weeks    PT Treatment/Interventions ADLs/Self Care Home Management;Aquatic Therapy;Moist Heat;Traction;Ultrasound;Cryotherapy;Parrafin;Functional mobility training;Neuromuscular re-education;Taping;Dry needling;Passive range of motion;Joint Manipulations;Spinal Manipulations;Patient/family education;Therapeutic exercise;Manual techniques;Therapeutic activities;Electrical Stimulation;Fluidtherapy;Iontophoresis '4mg'$ /ml Dexamethasone  PT Next Visit Plan Increase cervical ROM; postural remediation;    PT Home Exercise Plan no updates    Consulted and Agree with Plan of Care Patient             Patient will benefit from skilled therapeutic intervention in order to improve the following deficits and impairments:  Decreased endurance, Decreased mobility, Pain, Postural dysfunction, Impaired UE functional use, Impaired flexibility, Increased fascial restricitons, Decreased strength, Decreased activity tolerance, Decreased range of motion, Decreased scar mobility, Impaired tone, Improper body mechanics, Hypomobility  Visit Diagnosis: Muscle weakness (generalized)  Stiffness of right shoulder, not elsewhere classified  Cervicalgia     Problem List There are no problems to display for this patient.   Salem Caster. Fairly IV, PT, DPT Physical Therapist- [facility]  02/13/2022, 3:40 PM  [place] [facility] [address] [place], [address] Phone: [phone]   Fax:  [phone]  Name: DAFNE NIELD MRN: [id number] Date of Birth: 1965-12-08

## 2022-02-20 ENCOUNTER — Encounter: Payer: BC Managed Care – PPO | Admitting: Physical Therapy

## 2022-02-25 ENCOUNTER — Encounter: Payer: Self-pay | Admitting: Physical Therapy

## 2022-02-25 ENCOUNTER — Ambulatory Visit: Payer: Medicare Other | Attending: Internal Medicine | Admitting: Physical Therapy

## 2022-02-25 DIAGNOSIS — M6281 Muscle weakness (generalized): Secondary | ICD-10-CM | POA: Insufficient documentation

## 2022-02-25 DIAGNOSIS — M542 Cervicalgia: Secondary | ICD-10-CM | POA: Diagnosis present

## 2022-02-25 DIAGNOSIS — M25611 Stiffness of right shoulder, not elsewhere classified: Secondary | ICD-10-CM | POA: Insufficient documentation

## 2022-02-25 NOTE — Therapy (Signed)
[place] [facility] [address] [place], [address] Phone: [phone]   Fax:  [phone]  Physical Therapy Treatment  Patient Details  Name: Whitney Mclean MRN: [id number] Date of Birth: [birth year] No data recorded  Encounter Date: 02/25/2022   PT End of Session - 02/25/22 1005     Visit Number 19    Number of Visits 26    Date for PT Re-Evaluation 04/23/22    Authorization Type BCBS    Authorization - Visit Number 9    Authorization - Number of Visits 10    Progress Note Due on Visit 30    PT Start Time 1000    PT Stop Time 1040    PT Time Calculation (min) 40 min    Activity Tolerance Patient tolerated treatment well;No increased pain    Behavior During Therapy WFL for tasks assessed/performed             Past Medical History:  Diagnosis Date   Oral cancer ([place])    Scleroderma ([place])     Past Surgical History:  Procedure Laterality Date   ABDOMINAL HYSTERECTOMY      There were no vitals filed for this visit.   Subjective Assessment - 02/25/22 1003     Subjective Her foot is feeling much better, Reports she is doing well overall.    Pertinent History Whitney Mclean is a 71yoF who comes to [place] due to ongoing global stiffness in neck and shoulders in the setting of new scleroderma s/p radiation and chemotherapy 2/2 oral cancer (2016) and removal of Rt lymph nodes.    Limitations Sitting;Writing;House hold activities;Lifting    How long can you sit comfortably? 30    How long can you stand comfortably? unlimited    How long can you walk comfortably? unlimited    Diagnostic tests None on shoulder    Patient Stated Goals Increase shoulder strength and mobility    Pain Onset More than a month ago           There.ex:    NuStep level 3 x 5 min to promote protraction/retraction Seat 7 UE 11  Prone Y 10 with eccentric control; with 1# 2x 10 with some decreased   Prone chest + head  lift x10; with Ues in T position x8 with cuing for eccentric control (unable to do full superman)  Bird dog Ue's only with cuing for maintained head lift with decent carry over   Squat with overhead press 2# DB each hand 2x10   Frequent seated rest breaks needed between exercises and sets due to muscular fatigue.   Manual:   To reduce pain and tissue tension, improve range of motion, neuromodulation, in order to promote improved ability to complete functional activities.   STM to R side of ant neck (hardened area) In sidelying for patient comfort for 8 minutes                             PT Education - 02/25/22 1005     Education Details therex form/technique    Person(s) Educated Patient    Methods Explanation;Demonstration;Verbal cues    Comprehension Verbalized understanding;Returned demonstration              PT Short Term Goals - 09/15/19 1119       PT SHORT TERM GOAL #1   Title Pt will be independent with HEP in order  to improve strength and decrease pain in order to improve pain-free function at home and work.    Baseline 08/04/19 Completing HEP regularly withotu question or concern    Time 4    Period Weeks    Status Achieved               PT Long Term Goals - 11/28/21 1241       PT LONG TERM GOAL #1   Title Pt will decrease quick DASH score by at least 8% in order to demonstrate clinically significant reduction in disability.    Baseline 06/11/19 56.8%; 08/04/19 34%    Time 8    Period Weeks    Status Deferred      PT LONG TERM GOAL #2   Title Patient will demonstrate full cervical ROM in order to drive safely    Baseline 11/17/19 R/L rotation 35/25 Sidebending 33/31 ext -6d; ; 09/19/20 R/L rotation 35/30 Sidebending 38/46 ext 10d; 04/04/21 R/L rotation 50/28 Sidebending 27/38 ext 10d;07/26/21 Rotation: L 30 deg R 40 deg Sidebending L 27 R 25; 11/28/21: F 40, E 35, LFlex R 24, LFlex L 20, Rot R 21, Rot L 18.    Time 8    Period  Weeks    Status On-going    Target Date 01/23/22      PT LONG TERM GOAL #3   Title Patient will demonstrate full R shoulder flex A/PROM in order to complete overhead ADLs    Baseline 11/17/19 AROM flex 120d PROM flex 145; PROM/AROM of ER and IR WNL; 09/19/20 AROM flex 140d PROM flex 167; 04/04/21 AROM flex 142d PROM flex 165; 07/26/21: AROM flex 125d PROM flex 142d; 11/28/21: flexion 155 active/ 160 passive    Time 8    Period Weeks    Status Achieved      PT LONG TERM GOAL #4   Title Patient will demonstrate 4+/5 gross periscapular and shoulder strength in order to complete heavy household ADLs and overhead ADLs    Baseline 11/17/19 R/L shoulder flex 3+/4+ abd 4-/5  ER 3+/4+  IR 4/5 T 3+/4+ Y 2+/3+; 09/19/20 R/L shoulder flex 4-/4+ abd 4/5  ER 4-/4+  IR 4+/5 T 4-/4+ Y 3+/4-; 04/04/21 R/L shoulder flex 4/5 abd 4/5  ER 4/4+  IR 5/5 T 4/4+ Y 4/4+: 07/26/21: R/L shoulder flex 4-/4 abd 4-/4 ER 4-/4-  IR 5/5  T4-/4 Y 4-/4... 11/28/21: flexion 4/4, abduction 4-/4, ER 3+/4-, IR 4/4, Y 3+/3+, T 3+/3+    Time 8    Period Weeks    Status On-going    Target Date 01/23/22                   Plan - 02/25/22 1033     Clinical Impression Statement PT continued therex progression for increasing overall extensor mobility and cervical mobility. PT initiated prone and quadraped positions for increased ext demand with success. Paitent is able to comply with all cuing for proper technique with good carry over, and excellent motivation throughout session. Patient able to tolerate progression with appropriate rest breaks needed. PT will continue progression as able.    Personal Factors and Comorbidities Age;Behavior Pattern;Comorbidity 1;Comorbidity 2;Past/Current Experience;Fitness;Sex;Time since onset of injury/illness/exacerbation    Comorbidities scleroderma, cancer    Examination-Activity Limitations Reach Overhead;Self Feeding;Dressing;Sleep;Lift    Examination-Participation Restrictions  Laundry;Cleaning;Community Activity;Driving    Stability/Clinical Decision Making Evolving/Moderate complexity    Clinical Decision Making Moderate    Rehab Potential Good    PT Frequency  1x / week    PT Duration 8 weeks    PT Treatment/Interventions ADLs/Self Care Home Management;Aquatic Therapy;Moist Heat;Traction;Ultrasound;Cryotherapy;Parrafin;Functional mobility training;Neuromuscular re-education;Taping;Dry needling;Passive range of motion;Joint Manipulations;Spinal Manipulations;Patient/family education;Therapeutic exercise;Manual techniques;Therapeutic activities;Electrical Stimulation;Fluidtherapy;Iontophoresis '4mg'$ /ml Dexamethasone    PT Next Visit Plan Increase cervical ROM; postural remediation;    PT Home Exercise Plan no updates    Consulted and Agree with Plan of Care Patient             Patient will benefit from skilled therapeutic intervention in order to improve the following deficits and impairments:  Decreased endurance, Decreased mobility, Pain, Postural dysfunction, Impaired UE functional use, Impaired flexibility, Increased fascial restricitons, Decreased strength, Decreased activity tolerance, Decreased range of motion, Decreased scar mobility, Impaired tone, Improper body mechanics, Hypomobility  Visit Diagnosis: Muscle weakness (generalized)     Problem List There are no problems to display for this patient.  Durwin Reges DPT Durwin Reges, PT 02/25/2022, 1:35 PM  [place] [facility] [address], [place], [address] Phone: [phone]   Fax:  [phone]  Name: Whitney Mclean MRN: [id number] Date of Birth: 1966-02-18

## 2022-02-27 ENCOUNTER — Encounter: Payer: BC Managed Care – PPO | Admitting: Physical Therapy

## 2022-03-06 ENCOUNTER — Ambulatory Visit: Payer: Medicare Other | Admitting: Physical Therapy

## 2022-03-06 DIAGNOSIS — M542 Cervicalgia: Secondary | ICD-10-CM

## 2022-03-06 DIAGNOSIS — M6281 Muscle weakness (generalized): Secondary | ICD-10-CM | POA: Diagnosis not present

## 2022-03-06 DIAGNOSIS — M25611 Stiffness of right shoulder, not elsewhere classified: Secondary | ICD-10-CM

## 2022-03-06 NOTE — Therapy (Signed)
[facility] [address] [place], [address] Phone: [phone]   Fax:  [phone]  Physical Therapy Treatment  Patient Details  Name: Whitney Mclean MRN: [id number] Date of Birth: August 24, 1966 No data recorded  Encounter Date: 03/06/2022   PT End of Session - 03/06/22 1344     Visit Number 20    Number of Visits 26    Date for PT Re-Evaluation 04/23/22    Authorization Type BCBS    Authorization Time Period 01/29/22 - 04/23/22    Authorization - Visit Number 10    Authorization - Number of Visits 10    Progress Note Due on Visit 30    PT Start Time 1302    PT Stop Time 1344    PT Time Calculation (min) 42 min    Activity Tolerance Patient tolerated treatment well;No increased pain    Behavior During Therapy WFL for tasks assessed/performed             Past Medical History:  Diagnosis Date   Oral cancer ([place])    Scleroderma ([place])     Past Surgical History:  Procedure Laterality Date   ABDOMINAL HYSTERECTOMY      There were no vitals filed for this visit.   Subjective Assessment - 03/06/22 1308     Subjective Feeling good today. Completing HEP.    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    Limitations Sitting;Writing;House hold activities;Lifting    How long can you sit comfortably? 30    How long can you stand comfortably? unlimited    How long can you walk comfortably? unlimited    Diagnostic tests None on shoulder    Patient Stated Goals Increase shoulder strength and mobility    Pain Onset More than a month ago             There.ex:    NuStep level 3 x 5 min to promote protraction/retraction Seat 7 UE 11   Prone Y with 1# 2x 10; 2# x6 with cuing for eccentric control     Superman with UE in T position 3x 10/9/8 with encouragement for max lift with good carry over   Bird dog  on wall Ue's only with cuing for maintained head lift with decent carry over   Squat with maintainied BUE flex 90d 2# DB each hand 2x10   Frequent seated rest breaks needed between exercises and sets due to muscular fatigue.   Manual:   To reduce pain and tissue tension, improve range of motion, neuromodulation, in order to promote improved ability to complete functional activities.   STM to R side of ant neck (hardened area) In sidelying for patient comfort for 8 minutes                             PT Education - 03/06/22 1339     Education Details therex form/technique    Person(s) Educated Patient    Methods Explanation;Demonstration;Verbal cues    Comprehension Verbal cues required;Returned demonstration;Verbalized understanding              PT Short Term Goals - 09/15/19 1119       PT SHORT TERM GOAL #1  Title Pt will be independent with HEP in order to improve strength and decrease pain in order to improve pain-free function at home and work.    Baseline 08/04/19 Completing HEP regularly withotu question or concern    Time 4    Period Weeks    Status Achieved               PT Long Term Goals - 11/28/21 1241       PT LONG TERM GOAL #1   Title Pt will decrease quick DASH score by at least 8% in order to demonstrate clinically significant reduction in disability.    Baseline 06/11/19 56.8%; 08/04/19 34%    Time 8    Period Weeks    Status Deferred      PT LONG TERM GOAL #2   Title Patient will demonstrate full cervical ROM in order to drive safely    Baseline 11/17/19 R/L rotation 35/25 Sidebending 33/31 ext -6d; ; 09/19/20 R/L rotation 35/30 Sidebending 38/46 ext 10d; 04/04/21 R/L rotation 50/28 Sidebending 27/38 ext 10d;07/26/21 Rotation: L 30 deg R 40 deg Sidebending L 27 R 25; 11/28/21: F 40, E 35, LFlex R 24, LFlex L 20, Rot R  21, Rot L 18.    Time 8    Period Weeks    Status On-going    Target Date 01/23/22      PT LONG TERM GOAL #3   Title Patient will demonstrate full R shoulder flex A/PROM in order to complete overhead ADLs    Baseline 11/17/19 AROM flex 120d PROM flex 145; PROM/AROM of ER and IR WNL; 09/19/20 AROM flex 140d PROM flex 167; 04/04/21 AROM flex 142d PROM flex 165; 07/26/21: AROM flex 125d PROM flex 142d; 11/28/21: flexion 155 active/ 160 passive    Time 8    Period Weeks    Status Achieved      PT LONG TERM GOAL #4   Title Patient will demonstrate 4+/5 gross periscapular and shoulder strength in order to complete heavy household ADLs and overhead ADLs    Baseline 11/17/19 R/L shoulder flex 3+/4+ abd 4-/5  ER 3+/4+  IR 4/5 T 3+/4+ Y 2+/3+; 09/19/20 R/L shoulder flex 4-/4+ abd 4/5  ER 4-/4+  IR 4+/5 T 4-/4+ Y 3+/4-; 04/04/21 R/L shoulder flex 4/5 abd 4/5  ER 4/4+  IR 5/5 T 4/4+ Y 4/4+: 07/26/21: R/L shoulder flex 4-/4 abd 4-/4 ER 4-/4-  IR 5/5  T4-/4 Y 4-/4... 11/28/21: flexion 4/4, abduction 4-/4, ER 3+/4-, IR 4/4, Y 3+/3+, T 3+/3+    Time 8    Period Weeks    Status On-going    Target Date 01/23/22                   Plan - 03/06/22 1541     Clinical Impression Statement PT continued therex progression for increased BUE mobility and extensor strengtheing with success. Patient is motivated throughout session, with excellent carry over of all cuing for proper technique of therex. Patient with noted muscle fatigue at the end of sets. PT will continue progression as able.    Personal Factors and Comorbidities Age;Behavior Pattern;Comorbidity 1;Comorbidity 2;Past/Current Experience;Fitness;Sex;Time since onset of injury/illness/exacerbation    Comorbidities scleroderma, cancer    Examination-Activity Limitations Reach Overhead;Self Feeding;Dressing;Sleep;Lift    Examination-Participation Restrictions Laundry;Cleaning;Community Activity;Driving    Stability/Clinical Decision Making Evolving/Moderate  complexity    Clinical Decision Making Moderate    Rehab Potential Good    PT Frequency 1x / week  PT Duration 8 weeks    PT Treatment/Interventions ADLs/Self Care Home Management;Aquatic Therapy;Moist Heat;Traction;Ultrasound;Cryotherapy;Parrafin;Functional mobility training;Neuromuscular re-education;Taping;Dry needling;Passive range of motion;Joint Manipulations;Spinal Manipulations;Patient/family education;Therapeutic exercise;Manual techniques;Therapeutic activities;Electrical Stimulation;Fluidtherapy;Iontophoresis '4mg'$ /ml Dexamethasone    PT Next Visit Plan Increase cervical ROM; postural remediation;    PT Home Exercise Plan no updates    Consulted and Agree with Plan of Care Patient             Patient will benefit from skilled therapeutic intervention in order to improve the following deficits and impairments:  Decreased endurance, Decreased mobility, Pain, Postural dysfunction, Impaired UE functional use, Impaired flexibility, Increased fascial restricitons, Decreased strength, Decreased activity tolerance, Decreased range of motion, Decreased scar mobility, Impaired tone, Improper body mechanics, Hypomobility  Visit Diagnosis: Muscle weakness (generalized)  Stiffness of right shoulder, not elsewhere classified  Cervicalgia     Problem List There are no problems to display for this patient.  Durwin Reges DPT Durwin Reges, PT 03/06/2022, 3:43 PM  [facility] [address], [place], [address] Phone: [phone]   Fax:  [phone]  Name: Whitney Mclean MRN: [id number] Date of Birth: 06/13/1966

## 2022-03-07 ENCOUNTER — Ambulatory Visit: Payer: Medicare Other | Admitting: Physical Therapy

## 2022-03-13 ENCOUNTER — Ambulatory Visit: Payer: Medicare Other | Admitting: Physical Therapy

## 2022-03-20 ENCOUNTER — Encounter: Payer: Self-pay | Admitting: Physical Therapy

## 2022-03-20 ENCOUNTER — Ambulatory Visit: Payer: Medicare Other | Admitting: Physical Therapy

## 2022-03-20 DIAGNOSIS — M6281 Muscle weakness (generalized): Secondary | ICD-10-CM

## 2022-03-20 DIAGNOSIS — M25611 Stiffness of right shoulder, not elsewhere classified: Secondary | ICD-10-CM

## 2022-03-20 DIAGNOSIS — M542 Cervicalgia: Secondary | ICD-10-CM

## 2022-03-20 IMAGING — MG MM DIGITAL SCREENING BILAT W/ TOMO AND CAD
6 of 10 series · 6 of 30 positions shown · non-contrast
Comparison: Previous exam(s).

CLINICAL DATA: Screening.

EXAM:
DIGITAL SCREENING BILATERAL MAMMOGRAM WITH TOMO AND CAD

[L CC synth-2D]
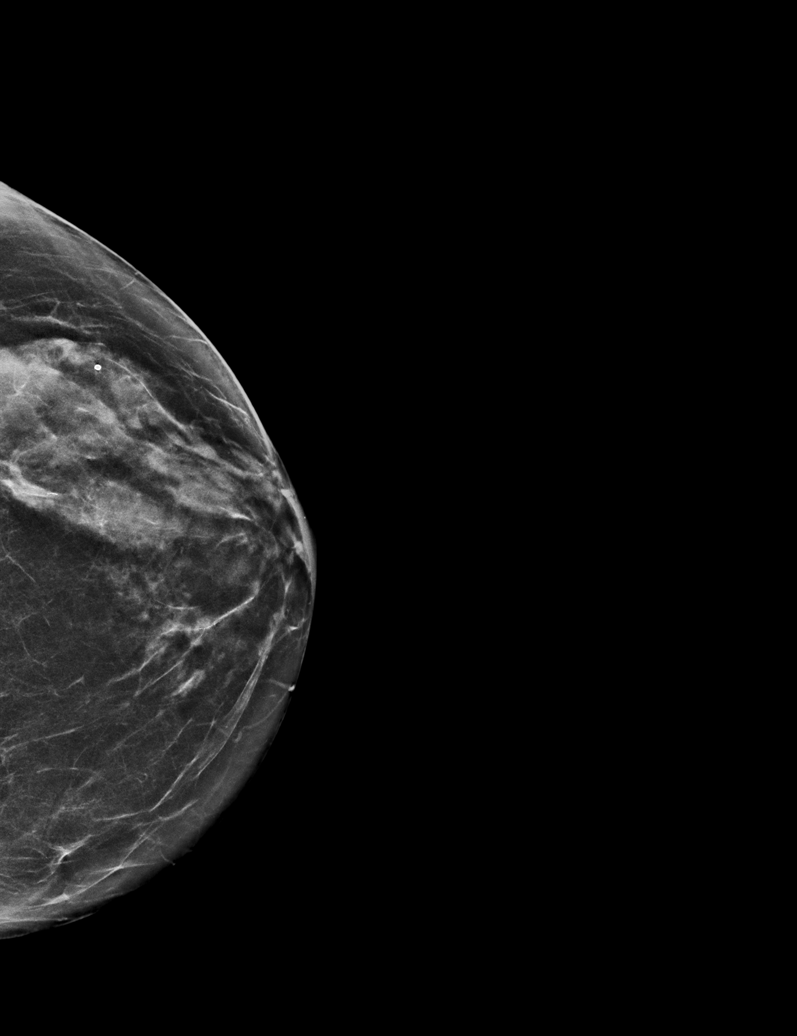

[L MLO synth-2D]
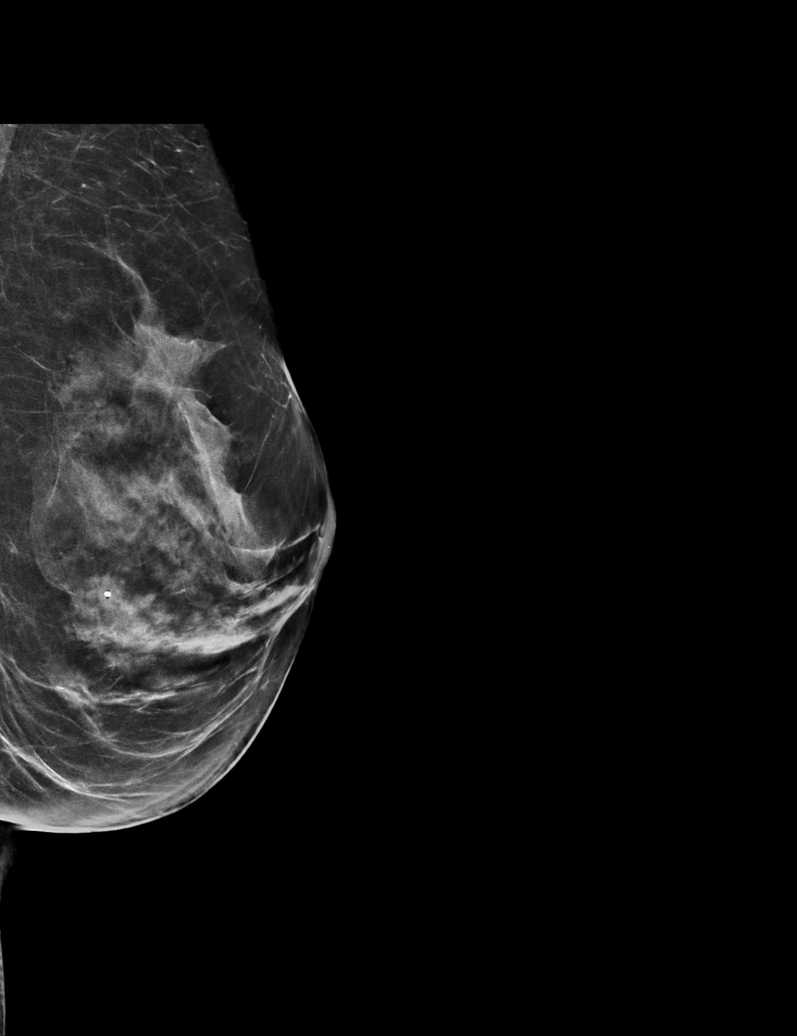

[R CC synth-2D (1 of 2)]
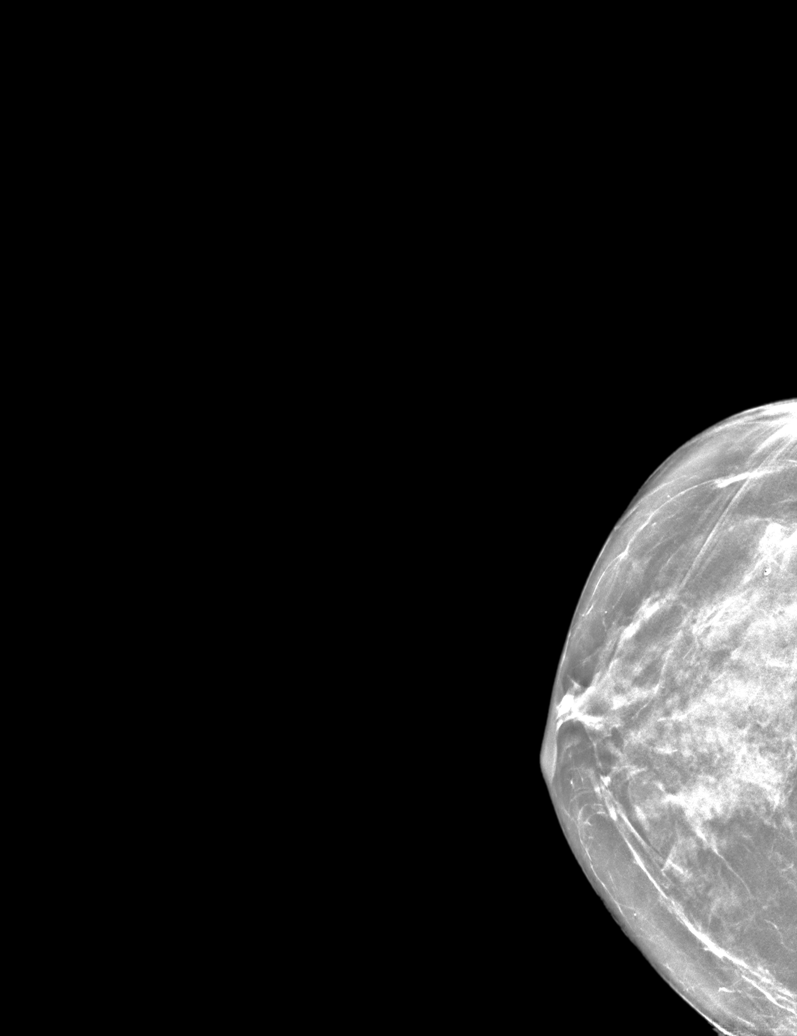

[R MLO synth-2D]
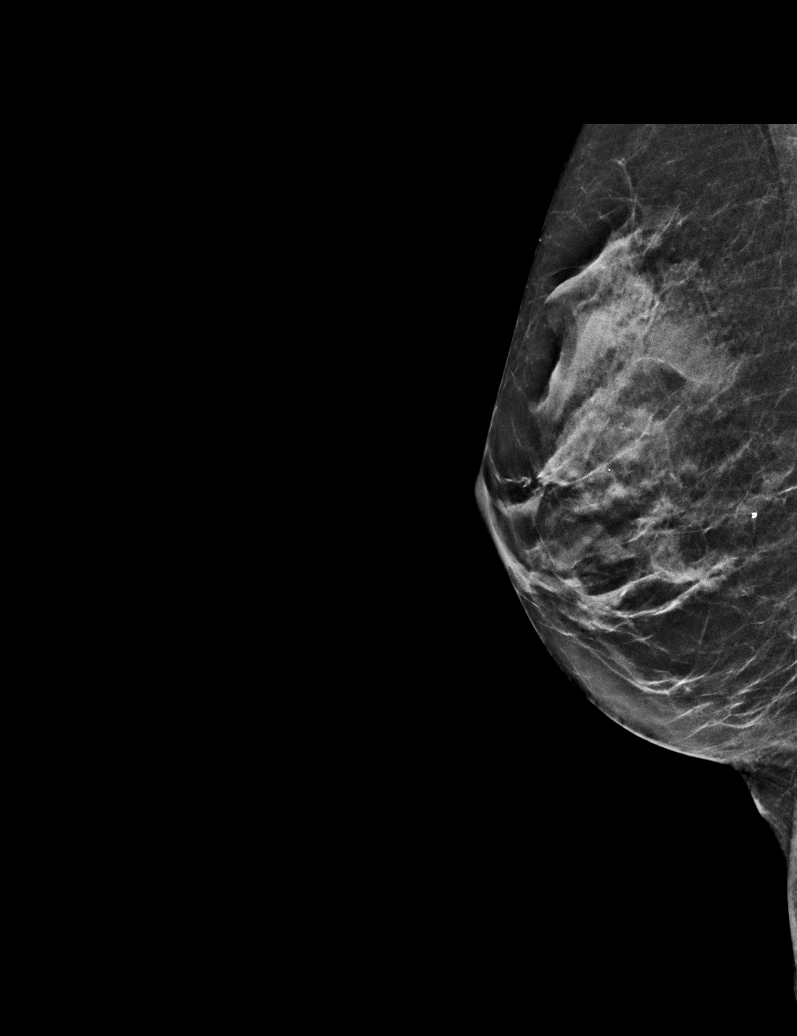

[R CC synth-2D (2 of 2)]
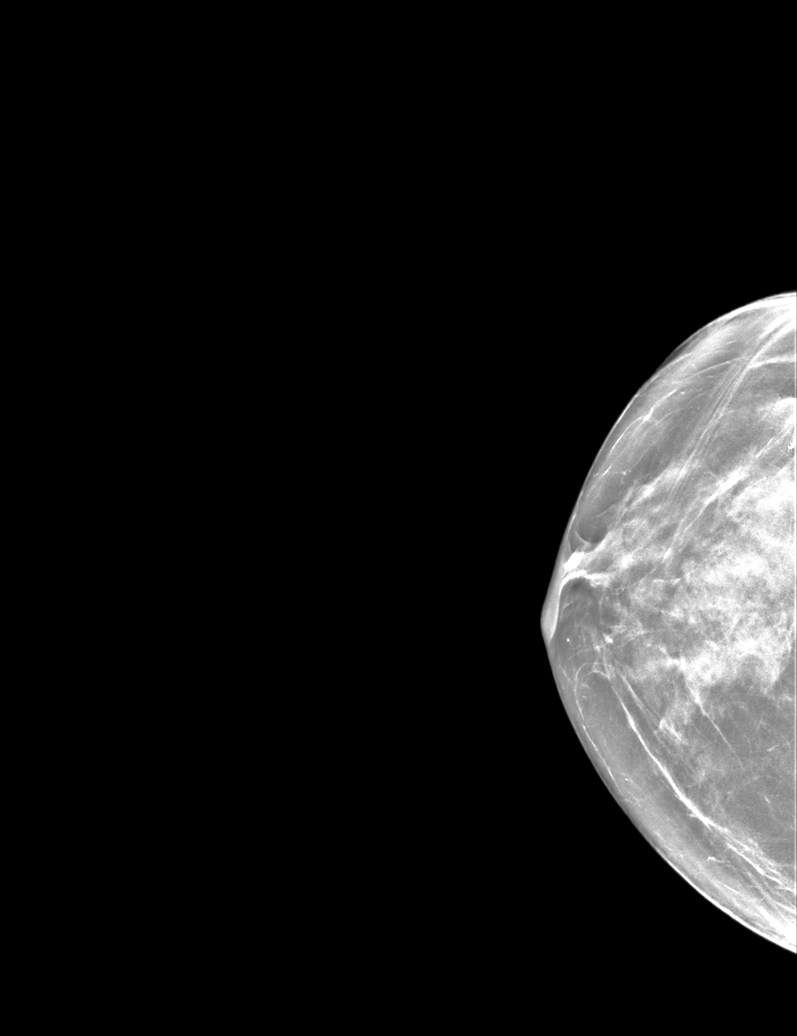

[R MLO tomo · tomo slice 24/47.0]
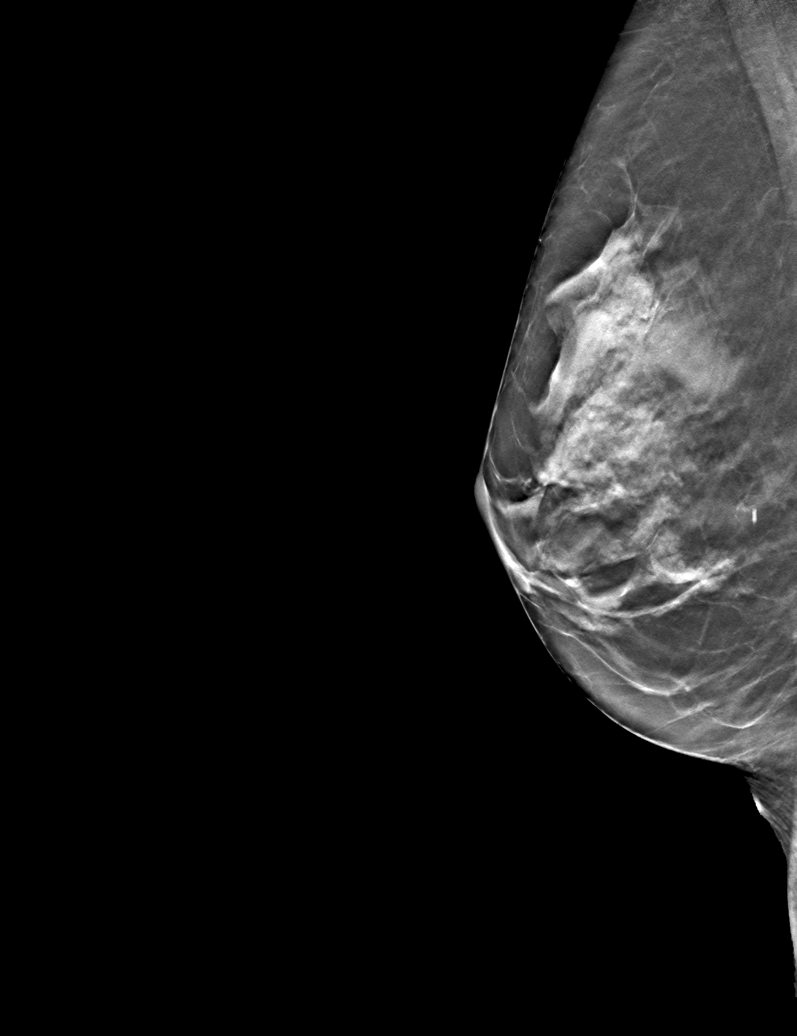

[6 of 30 positions shown; findings below may reference images not displayed]

ACR Breast Density Category c: The breast tissue is heterogeneously
dense, which may obscure small masses.
FINDINGS: There are no findings suspicious for malignancy. The images were
evaluated with computer-aided detection.
IMPRESSION: No mammographic evidence of malignancy. A result letter of this
screening mammogram will be mailed directly to the patient.

RECOMMENDATION:
Screening mammogram in one year. (Code:JF-W-WVL)

BI-RADS CATEGORY  1: Negative.

## 2022-03-20 NOTE — Therapy (Signed)
[facility] [address] [place], [address] Phone: [phone]   Fax:  [phone]  Physical Therapy Treatment  Patient Details  Name: Whitney Mclean MRN: [id number] Date of Birth: [birth year] No data recorded  Encounter Date: 03/20/2022   PT End of Session - 03/20/22 1322     Visit Number 21    Number of Visits 26    Date for PT Re-Evaluation 04/23/22    Authorization Type BCBS    Authorization Time Period 01/29/22 - 04/23/22    Authorization - Visit Number 1    Authorization - Number of Visits 10    Progress Note Due on Visit 30    PT Start Time 5364    PT Stop Time 1344    PT Time Calculation (min) 41 min    Activity Tolerance Patient tolerated treatment well;No increased pain    Behavior During Therapy WFL for tasks assessed/performed             Past Medical History:  Diagnosis Date   Oral cancer ([place])    Scleroderma ([place])     Past Surgical History:  Procedure Laterality Date   ABDOMINAL HYSTERECTOMY      There were no vitals filed for this visit.   Subjective Assessment - 03/20/22 1320     Subjective Feeling good today. Completing HEP withour issue. 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    Limitations Sitting;Writing;House hold activities;Lifting    How long can you sit comfortably? 30    How long can you stand comfortably? unlimited    How long can you walk comfortably? unlimited    Diagnostic tests None on shoulder    Patient Stated Goals Increase shoulder strength and mobility             There.ex:    NuStep level 3 x 5 min to promote protraction/retraction Seat 7 UE 11   Prone Y with 1# 2x 10; 2# x6 with cuing for  eccentric control    Alt superman in prone x12; full superman 2x 10 with good carry over following initialy cuing   Squat with overhead press 2# DB 2x 10 with cuin fro proper technique initially with good carry over   Frequent seated rest breaks needed between exercises and sets due to muscular fatigue.   Manual:   To reduce pain and tissue tension, improve range of motion, neuromodulation, in order to promote improved ability to complete functional activities.   STM to R side of ant neck (hardened area) In sidelying for patient comfort for 8 minutes                           PT Education - 03/20/22 1321     Education Details therex form/technique    Person(s) Educated Patient    Methods Explanation;Demonstration;Verbal cues    Comprehension Returned demonstration;Verbalized understanding;Verbal cues required              PT Short Term Goals - 09/15/19 1119       PT SHORT TERM GOAL #1   Title Pt will be independent with HEP in order to improve strength and decrease  pain in order to improve pain-free function at home and work.    Baseline 08/04/19 Completing HEP regularly withotu question or concern    Time 4    Period Weeks    Status Achieved               PT Long Term Goals - 11/28/21 1241       PT LONG TERM GOAL #1   Title Pt will decrease quick DASH score by at least 8% in order to demonstrate clinically significant reduction in disability.    Baseline 06/11/19 56.8%; 08/04/19 34%    Time 8    Period Weeks    Status Deferred      PT LONG TERM GOAL #2   Title Patient will demonstrate full cervical ROM in order to drive safely    Baseline 11/17/19 R/L rotation 35/25 Sidebending 33/31 ext -6d; ; 09/19/20 R/L rotation 35/30 Sidebending 38/46 ext 10d; 04/04/21 R/L rotation 50/28 Sidebending 27/38 ext 10d;07/26/21 Rotation: L 30 deg R 40 deg Sidebending L 27 R 25; 11/28/21: F 40, E 35, LFlex R 24, LFlex L 20, Rot R 21, Rot L 18.    Time 8     Period Weeks    Status On-going    Target Date 01/23/22      PT LONG TERM GOAL #3   Title Patient will demonstrate full R shoulder flex A/PROM in order to complete overhead ADLs    Baseline 11/17/19 AROM flex 120d PROM flex 145; PROM/AROM of ER and IR WNL; 09/19/20 AROM flex 140d PROM flex 167; 04/04/21 AROM flex 142d PROM flex 165; 07/26/21: AROM flex 125d PROM flex 142d; 11/28/21: flexion 155 active/ 160 passive    Time 8    Period Weeks    Status Achieved      PT LONG TERM GOAL #4   Title Patient will demonstrate 4+/5 gross periscapular and shoulder strength in order to complete heavy household ADLs and overhead ADLs    Baseline 11/17/19 R/L shoulder flex 3+/4+ abd 4-/5  ER 3+/4+  IR 4/5 T 3+/4+ Y 2+/3+; 09/19/20 R/L shoulder flex 4-/4+ abd 4/5  ER 4-/4+  IR 4+/5 T 4-/4+ Y 3+/4-; 04/04/21 R/L shoulder flex 4/5 abd 4/5  ER 4/4+  IR 5/5 T 4/4+ Y 4/4+: 07/26/21: R/L shoulder flex 4-/4 abd 4-/4 ER 4-/4-  IR 5/5  T4-/4 Y 4-/4... 11/28/21: flexion 4/4, abduction 4-/4, ER 3+/4-, IR 4/4, Y 3+/3+, T 3+/3+    Time 8    Period Weeks    Status On-going    Target Date 01/23/22                   Plan - 03/20/22 1327     Clinical Impression Statement PT continued therex progression for increased BUE mobility and extensor strengthening with continued success. Pt is able to complete full superman this session, which she has not been able to in the past. Pt is motivated throughout session without increased pain. PT will conitnue progression as able.    Personal Factors and Comorbidities Age;Behavior Pattern;Comorbidity 1;Comorbidity 2;Past/Current Experience;Fitness;Sex;Time since onset of injury/illness/exacerbation    Comorbidities scleroderma, cancer    Examination-Activity Limitations Reach Overhead;Self Feeding;Dressing;Sleep;Lift    Examination-Participation Restrictions Laundry;Cleaning;Community Activity;Driving    Stability/Clinical Decision Making Evolving/Moderate complexity    Clinical  Decision Making Moderate    Rehab Potential Good    PT Frequency 1x / week    PT Duration 8 weeks    PT Treatment/Interventions ADLs/Self Care Home  Management;Aquatic Therapy;Moist Heat;Traction;Ultrasound;Cryotherapy;Parrafin;Functional mobility training;Neuromuscular re-education;Taping;Dry needling;Passive range of motion;Joint Manipulations;Spinal Manipulations;Patient/family education;Therapeutic exercise;Manual techniques;Therapeutic activities;Electrical Stimulation;Fluidtherapy;Iontophoresis '4mg'$ /ml Dexamethasone    PT Next Visit Plan Increase cervical ROM; postural remediation;    PT Home Exercise Plan no updates    Consulted and Agree with Plan of Care Patient             Patient will benefit from skilled therapeutic intervention in order to improve the following deficits and impairments:  Decreased endurance, Decreased mobility, Pain, Postural dysfunction, Impaired UE functional use, Impaired flexibility, Increased fascial restricitons, Decreased strength, Decreased activity tolerance, Decreased range of motion, Decreased scar mobility, Impaired tone, Improper body mechanics, Hypomobility  Visit Diagnosis: Muscle weakness (generalized)  Stiffness of right shoulder, not elsewhere classified  Cervicalgia     Problem List There are no problems to display for this patient.  Durwin Reges DPT Durwin Reges, PT 03/20/2022, 1:44 PM  [facility] [address], [place], [address] Phone: [phone]   Fax:  [phone]  Name: Whitney Mclean MRN: [id number] Date of Birth: [birth year]

## 2022-03-28 ENCOUNTER — Ambulatory Visit: Payer: Medicare Other | Attending: Internal Medicine | Admitting: Physical Therapy

## 2022-03-28 ENCOUNTER — Encounter: Payer: Self-pay | Admitting: Physical Therapy

## 2022-03-28 DIAGNOSIS — M25611 Stiffness of right shoulder, not elsewhere classified: Secondary | ICD-10-CM | POA: Insufficient documentation

## 2022-03-28 DIAGNOSIS — M6281 Muscle weakness (generalized): Secondary | ICD-10-CM | POA: Diagnosis present

## 2022-03-28 NOTE — Therapy (Signed)
[facility] [address], [place], [address] Phone: [phone]   Fax:  [phone]  Physical Therapy Treatment  Patient Details  Name: Whitney Mclean MRN: [id number] Date of Birth: 1965-12-06 No data recorded  Encounter Date: 03/28/2022   PT End of Session - 03/28/22 1326     Visit Number 22    Number of Visits 26    Date for PT Re-Evaluation 04/23/22    Authorization Time Period 01/29/22 - 04/23/22    Authorization - Visit Number 2    Authorization - Number of Visits 10    Progress Note Due on Visit 30    PT Start Time 1302    PT Stop Time 1340    PT Time Calculation (min) 38 min    Activity Tolerance Patient tolerated treatment well;No increased pain    Behavior During Therapy WFL for tasks assessed/performed             Past Medical History:  Diagnosis Date   Oral cancer ([place])    Scleroderma ([place])     Past Surgical History:  Procedure Laterality Date   ABDOMINAL HYSTERECTOMY      There were no vitals filed for this visit.       There.ex:    NuStep level 3 x 5 min to promote protraction/retraction Seat 7 UE 11   Prone Y with 2# 3x 6 with cuing for eccentric control    Birddog 2x 10 min A at hips for stability; cuing for eccentric control with some carry over   Overhead squat with dowel 3x 10 with good carry over of initial cuing of keeping "eyes up"   Frequent seated rest breaks needed between exercises and sets due to muscular fatigue.   Manual:   To reduce pain and tissue tension, improve range of motion, neuromodulation, in order to promote improved ability to complete functional activities.   STM to R side of ant neck (hardened area) In sidelying for patient comfort for 8 minutes                                  PT Education - 03/28/22 1310     Education Details therex form/technique    Person(s) Educated Patient    Methods  Explanation;Demonstration;Verbal cues    Comprehension Verbalized understanding;Returned demonstration;Verbal cues required              PT Short Term Goals - 09/15/19 1119       PT SHORT TERM GOAL #1   Title Pt will be independent with HEP in order to improve strength and decrease pain in order to improve pain-free function at home and work.    Baseline 08/04/19 Completing HEP regularly withotu question or concern    Time 4    Period Weeks    Status Achieved               PT Long Term Goals - 11/28/21 1241       PT LONG TERM GOAL #1   Title Pt will decrease quick DASH score by at least 8% in order to demonstrate clinically significant reduction in disability.    Baseline 06/11/19 56.8%; 08/04/19 34%    Time 8    Period Weeks    Status Deferred      PT LONG TERM GOAL #2   Title Patient will demonstrate full cervical ROM in  order to drive safely    Baseline 11/17/19 R/L rotation 35/25 Sidebending 33/31 ext -6d; ; 09/19/20 R/L rotation 35/30 Sidebending 38/46 ext 10d; 04/04/21 R/L rotation 50/28 Sidebending 27/38 ext 10d;07/26/21 Rotation: L 30 deg R 40 deg Sidebending L 27 R 25; 11/28/21: F 40, E 35, LFlex R 24, LFlex L 20, Rot R 21, Rot L 18.    Time 8    Period Weeks    Status On-going    Target Date 01/23/22      PT LONG TERM GOAL #3   Title Patient will demonstrate full R shoulder flex A/PROM in order to complete overhead ADLs    Baseline 11/17/19 AROM flex 120d PROM flex 145; PROM/AROM of ER and IR WNL; 09/19/20 AROM flex 140d PROM flex 167; 04/04/21 AROM flex 142d PROM flex 165; 07/26/21: AROM flex 125d PROM flex 142d; 11/28/21: flexion 155 active/ 160 passive    Time 8    Period Weeks    Status Achieved      PT LONG TERM GOAL #4   Title Patient will demonstrate 4+/5 gross periscapular and shoulder strength in order to complete heavy household ADLs and overhead ADLs    Baseline 11/17/19 R/L shoulder flex 3+/4+ abd 4-/5  ER 3+/4+  IR 4/5 T 3+/4+ Y 2+/3+; 09/19/20 R/L  shoulder flex 4-/4+ abd 4/5  ER 4-/4+  IR 4+/5 T 4-/4+ Y 3+/4-; 04/04/21 R/L shoulder flex 4/5 abd 4/5  ER 4/4+  IR 5/5 T 4/4+ Y 4/4+: 07/26/21: R/L shoulder flex 4-/4 abd 4-/4 ER 4-/4-  IR 5/5  T4-/4 Y 4-/4... 11/28/21: flexion 4/4, abduction 4-/4, ER 3+/4-, IR 4/4, Y 3+/3+, T 3+/3+    Time 8    Period Weeks    Status On-going    Target Date 01/23/22                   Plan - 03/28/22 1337     Clinical Impression Statement PT continued therex progression for extensor strengthening and mobility with success. Patient ismotivated througout session and able to comply with all cuing for proper technique of all therex. PT will continue progression as able.    Personal Factors and Comorbidities Age;Behavior Pattern;Comorbidity 1;Comorbidity 2;Past/Current Experience;Fitness;Sex;Time since onset of injury/illness/exacerbation    Comorbidities scleroderma, cancer    Examination-Activity Limitations Reach Overhead;Self Feeding;Dressing;Sleep;Lift    Examination-Participation Restrictions Laundry;Cleaning;Community Activity;Driving    Stability/Clinical Decision Making Evolving/Moderate complexity    Clinical Decision Making Moderate    Rehab Potential Good    PT Frequency 1x / week    PT Duration 8 weeks    PT Treatment/Interventions ADLs/Self Care Home Management;Aquatic Therapy;Moist Heat;Traction;Ultrasound;Cryotherapy;Parrafin;Functional mobility training;Neuromuscular re-education;Taping;Dry needling;Passive range of motion;Joint Manipulations;Spinal Manipulations;Patient/family education;Therapeutic exercise;Manual techniques;Therapeutic activities;Electrical Stimulation;Fluidtherapy;Iontophoresis '4mg'$ /ml Dexamethasone    PT Next Visit Plan Increase cervical ROM; postural remediation;    PT Home Exercise Plan no updates    Consulted and Agree with Plan of Care Patient             Patient will benefit from skilled therapeutic intervention in order to improve the following deficits and  impairments:  Decreased endurance, Decreased mobility, Pain, Postural dysfunction, Impaired UE functional use, Impaired flexibility, Increased fascial restricitons, Decreased strength, Decreased activity tolerance, Decreased range of motion, Decreased scar mobility, Impaired tone, Improper body mechanics, Hypomobility  Visit Diagnosis: Muscle weakness (generalized)  Stiffness of right shoulder, not elsewhere classified     Problem List There are no problems to display for this patient.  Durwin Reges DPT [facility]  Terance Hart, PT 03/28/2022, 1:45 PM  [facility] [address], [place], [address] Phone: [phone]   Fax:  [phone]  Name: Whitney Mclean MRN: [id number] Date of Birth: Aug 10, 1966

## 2022-04-02 ENCOUNTER — Encounter: Payer: Self-pay | Admitting: Physical Therapy

## 2022-04-02 ENCOUNTER — Ambulatory Visit: Payer: Medicare Other | Admitting: Physical Therapy

## 2022-04-02 DIAGNOSIS — M6281 Muscle weakness (generalized): Secondary | ICD-10-CM | POA: Diagnosis not present

## 2022-04-02 NOTE — Therapy (Signed)
[facility] [address] [place], [address] Phone: [phone]   Fax:  [phone]  Physical Therapy Treatment  Patient Details  Name: Whitney Mclean MRN: [id number] Date of Birth: [birth year] No data recorded  Encounter Date: 04/02/2022   PT End of Session - 04/02/22 1309     Visit Number 23    Number of Visits 26    Date for PT Re-Evaluation 04/23/22    Authorization Type BCBS    Authorization Time Period 01/29/22 - 04/23/22    Authorization - Visit Number 3    Authorization - Number of Visits 10    Progress Note Due on Visit 30    PT Start Time 1300    PT Stop Time 1339    PT Time Calculation (min) 39 min    Activity Tolerance Patient tolerated treatment well;No increased pain    Behavior During Therapy WFL for tasks assessed/performed             Past Medical History:  Diagnosis Date   Oral cancer ([place])    Scleroderma ([place])     Past Surgical History:  Procedure Laterality Date   ABDOMINAL HYSTERECTOMY      There were no vitals filed for this visit.   Subjective Assessment - 04/02/22 1307     Subjective Doing well overall. 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    Limitations Sitting;Writing;House hold activities;Lifting    How long can you sit comfortably? 30    How long can you stand comfortably? unlimited    How long can you walk comfortably? unlimited    Diagnostic tests None on shoulder    Patient Stated Goals Increase shoulder strength and mobility    Pain Onset More than a month ago             There.ex:    NuStep level 3 x 5 min to promote protraction/retraction Seat 7 UE 11   Prone chest/head lift with hands behind head  3x 6 with cuing for max lift with good carry over   Birddog 2x 12 min A at hips for stability; cuing for eccentric control with some carry over   Ball slam 3kg 3x 6 with good cuing  from initial demo   Frequent seated rest breaks needed between exercises and sets due to muscular fatigue.   Manual:   To reduce pain and tissue tension, improve range of motion, neuromodulation, in order to promote improved ability to complete functional activities.   STM to R side of ant neck (hardened area) In sidelying for patient comfort for 8 minutes                           PT Education - 04/02/22 1309     Education Details therex form/technique    Person(s) Educated Patient    Methods Explanation;Demonstration;Verbal cues    Comprehension Verbalized understanding;Returned demonstration;Verbal cues required              PT Short Term Goals - 09/15/19 1119       PT SHORT TERM GOAL #1   Title Pt will be independent with HEP in order to improve strength  and decrease pain in order to improve pain-free function at home and work.    Baseline 08/04/19 Completing HEP regularly withotu question or concern    Time 4    Period Weeks    Status Achieved               PT Long Term Goals - 11/28/21 1241       PT LONG TERM GOAL #1   Title Pt will decrease quick DASH score by at least 8% in order to demonstrate clinically significant reduction in disability.    Baseline 06/11/19 56.8%; 08/04/19 34%    Time 8    Period Weeks    Status Deferred      PT LONG TERM GOAL #2   Title Patient will demonstrate full cervical ROM in order to drive safely    Baseline 11/17/19 R/L rotation 35/25 Sidebending 33/31 ext -6d; ; 09/19/20 R/L rotation 35/30 Sidebending 38/46 ext 10d; 04/04/21 R/L rotation 50/28 Sidebending 27/38 ext 10d;07/26/21 Rotation: L 30 deg R 40 deg Sidebending L 27 R 25; 11/28/21: F 40, E 35, LFlex R 24, LFlex L 20, Rot R 21, Rot L 18.    Time 8    Period Weeks     Status On-going    Target Date 01/23/22      PT LONG TERM GOAL #3   Title Patient will demonstrate full R shoulder flex A/PROM in order to complete overhead ADLs    Baseline 11/17/19 AROM flex 120d PROM flex 145; PROM/AROM of ER and IR WNL; 09/19/20 AROM flex 140d PROM flex 167; 04/04/21 AROM flex 142d PROM flex 165; 07/26/21: AROM flex 125d PROM flex 142d; 11/28/21: flexion 155 active/ 160 passive    Time 8    Period Weeks    Status Achieved      PT LONG TERM GOAL #4   Title Patient will demonstrate 4+/5 gross periscapular and shoulder strength in order to complete heavy household ADLs and overhead ADLs    Baseline 11/17/19 R/L shoulder flex 3+/4+ abd 4-/5  ER 3+/4+  IR 4/5 T 3+/4+ Y 2+/3+; 09/19/20 R/L shoulder flex 4-/4+ abd 4/5  ER 4-/4+  IR 4+/5 T 4-/4+ Y 3+/4-; 04/04/21 R/L shoulder flex 4/5 abd 4/5  ER 4/4+  IR 5/5 T 4/4+ Y 4/4+: 07/26/21: R/L shoulder flex 4-/4 abd 4-/4 ER 4-/4-  IR 5/5  T4-/4 Y 4-/4... 11/28/21: flexion 4/4, abduction 4-/4, ER 3+/4-, IR 4/4, Y 3+/3+, T 3+/3+    Time 8    Period Weeks    Status On-going    Target Date 01/23/22                   Plan - 04/02/22 1337     Clinical Impression Statement PT continued therex progression for extensor strengthening with success. Patient is motivated throughout session, with a little increased fatigue d/t running errands today. Patient is motivated and able to comply with all cuing for proper technique of therex. PT will continue progression as able.    Personal Factors and Comorbidities Age;Behavior Pattern;Comorbidity 1;Comorbidity 2;Past/Current Experience;Fitness;Sex;Time since onset of injury/illness/exacerbation    Comorbidities scleroderma, cancer    Examination-Activity Limitations Reach Overhead;Self Feeding;Dressing;Sleep;Lift    Examination-Participation Restrictions Laundry;Cleaning;Community Activity;Driving    Stability/Clinical Decision Making Evolving/Moderate complexity    Clinical Decision Making Moderate     Rehab Potential Good    PT Frequency 1x / week    PT Duration 8 weeks    PT Treatment/Interventions ADLs/Self Care Home Management;Aquatic  Therapy;Moist Heat;Traction;Ultrasound;Cryotherapy;Parrafin;Functional mobility training;Neuromuscular re-education;Taping;Dry needling;Passive range of motion;Joint Manipulations;Spinal Manipulations;Patient/family education;Therapeutic exercise;Manual techniques;Therapeutic activities;Electrical Stimulation;Fluidtherapy;Iontophoresis '4mg'$ /ml Dexamethasone    PT Next Visit Plan Increase cervical ROM; postural remediation;    PT Home Exercise Plan no updates    Consulted and Agree with Plan of Care Patient             Patient will benefit from skilled therapeutic intervention in order to improve the following deficits and impairments:  Decreased endurance, Decreased mobility, Pain, Postural dysfunction, Impaired UE functional use, Impaired flexibility, Increased fascial restricitons, Decreased strength, Decreased activity tolerance, Decreased range of motion, Decreased scar mobility, Impaired tone, Improper body mechanics, Hypomobility  Visit Diagnosis: Muscle weakness (generalized)     Problem List There are no problems to display for this patient.  Durwin Reges DPT Durwin Reges, PT 04/02/2022, 1:44 PM  [facility] [address] [place], [address] Phone: [phone]   Fax:  [phone]  Name: Whitney Mclean MRN: [id number] Date of Birth: 1966-07-01

## 2022-04-10 ENCOUNTER — Encounter: Payer: Self-pay | Admitting: Physical Therapy

## 2022-04-10 ENCOUNTER — Ambulatory Visit: Payer: Medicare Other | Admitting: Physical Therapy

## 2022-04-10 DIAGNOSIS — M6281 Muscle weakness (generalized): Secondary | ICD-10-CM

## 2022-04-10 NOTE — Therapy (Signed)
OUTPATIENT PHYSICAL THERAPY TREATMENT NOTE   Patient Name: Whitney Mclean MRN: [id number] DOB:1965/11/16, 56 y.o., female Today's Date: 04/10/2022  PCP: Emily Filbert MD REFERRING PROVIDER: Artis Delay, MD   PT End of Session - 04/10/22 1320     Visit Number 24    Number of Visits 26    Date for PT Re-Evaluation 04/23/22    Authorization Type BCBS    Authorization Time Period 01/29/22 - 04/23/22    Authorization - Visit Number 4    Authorization - Number of Visits 10    Progress Note Due on Visit 30    PT Start Time 0175    PT Stop Time 1341    PT Time Calculation (min) 38 min    Activity Tolerance Patient tolerated treatment well;No increased pain    Behavior During Therapy WFL for tasks assessed/performed             Past Medical History:  Diagnosis Date   Oral cancer ([place])    Scleroderma ([place])    Past Surgical History:  Procedure Laterality Date   ABDOMINAL HYSTERECTOMY     There are no problems to display for this patient.   REFERRING DIAG: scleroderma    THERAPY DIAG:  Muscle weakness (generalized)  Rationale for Evaluation and Treatment Rehabilitation  PERTINENT HISTORY: Whitney Mclean is a 56yoF who comes to [place] due to ongoing global stiffness in neck and shoulders in the setting of new scleroderma s/p radiation and chemotherapy 2/2 oral cancer (2016) and removal of Rt lymph nodes.  PRECAUTIONS: none  SUBJECTIVE: Patient reports no pain today. Completing HEP witout question or continued  PAIN:  Are you having pain? No     TODAY'S TREATMENT:  There.ex:    NuStep level 3 x 5 min to promote protraction/retraction Seat 7 UE 11   Prone Y with 2# 3x 6 with cuing for eccentric control   Y on wall RTB 2x 12 with cuing to prevent lumbar ext   Bent over Y x12; with 1# DB 2x 10 with good carry over of demo    Frequent seated rest breaks needed between exercises and sets due to muscular fatigue.   Manual:   To reduce pain and tissue  tension, improve range of motion, neuromodulation, in order to promote improved ability to complete functional activities.   STM to R side of ant neck (hardened area) In sidelying for patient comfort for 8 minutes     PATIENT EDUCATION: Education details: therex form/technique Person educated: Patient Education method: Consulting civil engineer, Demonstration, and Verbal cues Education comprehension: verbalized understanding, returned demonstration, and verbal cues required   HOME EXERCISE PROGRAM: Y on wall Pulleys Sts with overhead push   PT Short Term Goals - 09/15/19 1119       PT SHORT TERM GOAL #1   Title Pt will be independent with HEP in order to improve strength and decrease pain in order to improve pain-free function at home and work.    Baseline 08/04/19 Completing HEP regularly withotu question or concern    Time 4    Period Weeks    Status Achieved              PT Long Term Goals - 11/28/21 1241       PT LONG TERM GOAL #1   Title Pt will decrease quick DASH score by at least 8% in order to demonstrate clinically significant reduction in disability.    Baseline 06/11/19 56.8%; 08/04/19 34%  Time 8    Period Weeks    Status Deferred      PT LONG TERM GOAL #2   Title Patient will demonstrate full cervical ROM in order to drive safely    Baseline 11/17/19 R/L rotation 35/25 Sidebending 33/31 ext -6d; ; 09/19/20 R/L rotation 35/30 Sidebending 38/46 ext 10d; 04/04/21 R/L rotation 50/28 Sidebending 27/38 ext 10d;07/26/21 Rotation: L 30 deg R 40 deg Sidebending L 27 R 25; 11/28/21: F 40, E 35, LFlex R 24, LFlex L 20, Rot R 21, Rot L 18.    Time 8    Period Weeks    Status On-going    Target Date 01/23/22      PT LONG TERM GOAL #3   Title Patient will demonstrate full R shoulder flex A/PROM in order to complete overhead ADLs    Baseline 11/17/19 AROM flex 120d PROM flex 145; PROM/AROM of ER and IR WNL; 09/19/20 AROM flex 140d PROM flex 167; 04/04/21 AROM flex 142d PROM flex 165;  07/26/21: AROM flex 125d PROM flex 142d; 11/28/21: flexion 155 active/ 160 passive    Time 8    Period Weeks    Status Achieved      PT LONG TERM GOAL #4   Title Patient will demonstrate 4+/5 gross periscapular and shoulder strength in order to complete heavy household ADLs and overhead ADLs    Baseline 11/17/19 R/L shoulder flex 3+/4+ abd 4-/5  ER 3+/4+  IR 4/5 T 3+/4+ Y 2+/3+; 09/19/20 R/L shoulder flex 4-/4+ abd 4/5  ER 4-/4+  IR 4+/5 T 4-/4+ Y 3+/4-; 04/04/21 R/L shoulder flex 4/5 abd 4/5  ER 4/4+  IR 5/5 T 4/4+ Y 4/4+: 07/26/21: R/L shoulder flex 4-/4 abd 4-/4 ER 4-/4-  IR 5/5  T4-/4 Y 4-/4... 11/28/21: flexion 4/4, abduction 4-/4, ER 3+/4-, IR 4/4, Y 3+/3+, T 3+/3+    Time 8    Period Weeks    Status On-going    Target Date 01/23/22              Plan - 04/10/22 1345     Clinical Impression Statement PT continued therex progression for extensor strengthening with success. PT continued increased prone and gravity dependent progression with pt able to comply with all cuing for proper technique with good motivation and no increased pain throughout session. PT will continue progression as able.    Personal Factors and Comorbidities Age;Behavior Pattern;Comorbidity 1;Comorbidity 2;Past/Current Experience;Fitness;Sex;Time since onset of injury/illness/exacerbation    Comorbidities scleroderma, cancer    Examination-Activity Limitations Reach Overhead;Self Feeding;Dressing;Sleep;Lift    Examination-Participation Restrictions Laundry;Cleaning;Community Activity;Driving    Stability/Clinical Decision Making Evolving/Moderate complexity    Clinical Decision Making Moderate    Rehab Potential Good    PT Frequency 1x / week    PT Duration 8 weeks    PT Treatment/Interventions ADLs/Self Care Home Management;Aquatic Therapy;Moist Heat;Traction;Ultrasound;Cryotherapy;Parrafin;Functional mobility training;Neuromuscular re-education;Taping;Dry needling;Passive range of motion;Joint Manipulations;Spinal  Manipulations;Patient/family education;Therapeutic exercise;Manual techniques;Therapeutic activities;Electrical Stimulation;Fluidtherapy;Iontophoresis '4mg'$ /ml Dexamethasone    PT Next Visit Plan Increase cervical ROM; postural remediation;    Consulted and Agree with Plan of Care Patient              Durwin Reges DPT Durwin Reges, PT 04/10/2022, 1:47 PM

## 2022-04-16 ENCOUNTER — Ambulatory Visit: Payer: Medicare Other | Admitting: Physical Therapy

## 2022-04-16 ENCOUNTER — Encounter: Payer: Self-pay | Admitting: Physical Therapy

## 2022-04-16 DIAGNOSIS — M6281 Muscle weakness (generalized): Secondary | ICD-10-CM

## 2022-04-16 NOTE — Therapy (Signed)
OUTPATIENT PHYSICAL THERAPY TREATMENT NOTE   Patient Name: Whitney Mclean MRN: [id number] DOB:1965/12/28, 56 y.o., female Today's Date: 04/16/2022  PCP: Emily Filbert MD REFERRING PROVIDER: Artis Delay, MD   PT End of Session - 04/16/22 1051     Visit Number 25    Number of Visits 26    Date for PT Re-Evaluation 04/23/22    Authorization Type BCBS    Authorization Time Period 01/29/22 - 04/23/22    Authorization - Visit Number 25    Authorization - Number of Visits 30    Progress Note Due on Visit 30    PT Start Time 1505    PT Stop Time 1127    PT Time Calculation (min) 38 min    Activity Tolerance Patient tolerated treatment well;No increased pain    Behavior During Therapy WFL for tasks assessed/performed             Past Medical History:  Diagnosis Date   Oral cancer ([place])    Scleroderma ([place])    Past Surgical History:  Procedure Laterality Date   ABDOMINAL HYSTERECTOMY     There are no problems to display for this patient.   REFERRING DIAG: sclaraderma  THERAPY DIAG:  Muscle weakness (generalized)  Rationale for Evaluation and Treatment Rehabilitation  PERTINENT HISTORY: Whitney Mclean is a 60yoF who comes to [place] due to ongoing global stiffness in neck and shoulders in the setting of new scleroderma s/p radiation and chemotherapy 2/2 oral cancer (2016) and removal of Rt lymph nodes.  PRECAUTIONS: none  SUBJECTIVE: Doing well to date. No pain. Reports compliance with HEP  PAIN:  Are you having pain? No     TODAY'S TREATMENT:  There.ex:    NuStep level 3 x 5 min to promote protraction/retraction Seat 7 UE 11   Alt Bird dog with UE lift in scaption 2x 12   Prone full superman 2x 10 with cuing for max ext; most difficulty    Bent over Y with 1# DB 3x 10 with good carry over of demo    Frequent seated rest breaks needed between exercises and sets due to muscular fatigue.   Manual:   To reduce pain and tissue tension, improve range  of motion, neuromodulation, in order to promote improved ability to complete functional activities.   STM to R side of ant neck (hardened area) In sidelying for patient comfort for 8 minutes     PATIENT EDUCATION: Education details: therex form/technique  Person educated: Patient Education method: Consulting civil engineer, Demonstration, and Verbal cues Education comprehension: verbalized understanding, returned demonstration, and verbal cues required   HOME EXERCISE PROGRAM: Y on wall Overhead press    PT Short Term Goals - 09/15/19 1119       PT SHORT TERM GOAL #1   Title Pt will be independent with HEP in order to improve strength and decrease pain in order to improve pain-free function at home and work.    Baseline 08/04/19 Completing HEP regularly withotu question or concern    Time 4    Period Weeks    Status Achieved              PT Long Term Goals - 11/28/21 1241       PT LONG TERM GOAL #1   Title Pt will decrease quick DASH score by at least 8% in order to demonstrate clinically significant reduction in disability.    Baseline 06/11/19 56.8%; 08/04/19 34%    Time 8  Period Weeks    Status Deferred      PT LONG TERM GOAL #2   Title Patient will demonstrate full cervical ROM in order to drive safely    Baseline 11/17/19 R/L rotation 35/25 Sidebending 33/31 ext -6d; ; 09/19/20 R/L rotation 35/30 Sidebending 38/46 ext 10d; 04/04/21 R/L rotation 50/28 Sidebending 27/38 ext 10d;07/26/21 Rotation: L 30 deg R 40 deg Sidebending L 27 R 25; 11/28/21: F 40, E 35, LFlex R 24, LFlex L 20, Rot R 21, Rot L 18.    Time 8    Period Weeks    Status On-going    Target Date 01/23/22      PT LONG TERM GOAL #3   Title Patient will demonstrate full R shoulder flex A/PROM in order to complete overhead ADLs    Baseline 11/17/19 AROM flex 120d PROM flex 145; PROM/AROM of ER and IR WNL; 09/19/20 AROM flex 140d PROM flex 167; 04/04/21 AROM flex 142d PROM flex 165; 07/26/21: AROM flex 125d PROM flex  142d; 11/28/21: flexion 155 active/ 160 passive    Time 8    Period Weeks    Status Achieved      PT LONG TERM GOAL #4   Title Patient will demonstrate 4+/5 gross periscapular and shoulder strength in order to complete heavy household ADLs and overhead ADLs    Baseline 11/17/19 R/L shoulder flex 3+/4+ abd 4-/5  ER 3+/4+  IR 4/5 T 3+/4+ Y 2+/3+; 09/19/20 R/L shoulder flex 4-/4+ abd 4/5  ER 4-/4+  IR 4+/5 T 4-/4+ Y 3+/4-; 04/04/21 R/L shoulder flex 4/5 abd 4/5  ER 4/4+  IR 5/5 T 4/4+ Y 4/4+: 07/26/21: R/L shoulder flex 4-/4 abd 4-/4 ER 4-/4-  IR 5/5  T4-/4 Y 4-/4... 11/28/21: flexion 4/4, abduction 4-/4, ER 3+/4-, IR 4/4, Y 3+/3+, T 3+/3+    Time 8    Period Weeks    Status On-going    Target Date 01/23/22              Plan - 04/16/22 1116     Clinical Impression Statement PT continued therex porgression for extensor strengtheing and maintaining/increasing cervical mobility with success. Paiten tis able to comply with all cuing for proper tecnique of therex progress with increased gravity demand. Pt with no increased pain throughout session. PT will continue progression as able.    Personal Factors and Comorbidities Age;Behavior Pattern;Comorbidity 1;Comorbidity 2;Past/Current Experience;Fitness;Sex;Time since onset of injury/illness/exacerbation    Comorbidities scleroderma, cancer    Examination-Activity Limitations Reach Overhead;Self Feeding;Dressing;Sleep;Lift    Examination-Participation Restrictions Laundry;Cleaning;Community Activity;Driving    Stability/Clinical Decision Making Evolving/Moderate complexity    Clinical Decision Making Moderate    Rehab Potential Good    PT Frequency 1x / week    PT Duration 8 weeks    PT Treatment/Interventions ADLs/Self Care Home Management;Aquatic Therapy;Moist Heat;Traction;Ultrasound;Cryotherapy;Parrafin;Functional mobility training;Neuromuscular re-education;Taping;Dry needling;Passive range of motion;Joint Manipulations;Spinal  Manipulations;Patient/family education;Therapeutic exercise;Manual techniques;Therapeutic activities;Electrical Stimulation;Fluidtherapy;Iontophoresis '4mg'$ /ml Dexamethasone    PT Next Visit Plan Increase cervical ROM; postural remediation;    PT Home Exercise Plan no updates    Consulted and Agree with Plan of Care Patient              Durwin Reges DPT Durwin Reges, PT 04/16/2022, 12:24 PM

## 2022-04-24 ENCOUNTER — Encounter: Payer: Self-pay | Admitting: Physical Therapy

## 2022-04-24 ENCOUNTER — Ambulatory Visit: Payer: Medicare Other | Attending: Internal Medicine | Admitting: Physical Therapy

## 2022-04-24 DIAGNOSIS — M6281 Muscle weakness (generalized): Secondary | ICD-10-CM | POA: Insufficient documentation

## 2022-04-24 DIAGNOSIS — M25611 Stiffness of right shoulder, not elsewhere classified: Secondary | ICD-10-CM | POA: Diagnosis present

## 2022-04-24 DIAGNOSIS — M542 Cervicalgia: Secondary | ICD-10-CM | POA: Insufficient documentation

## 2022-04-24 NOTE — Therapy (Signed)
OUTPATIENT PHYSICAL THERAPY TREATMENT NOTE   Patient Name: Whitney Mclean MRN: [id number] DOB:11-01-1965, 56 y.o., female Today's Date: 04/24/2022  PCP: Emily Filbert MD REFERRING PROVIDER: Artis Delay, MD   PT End of Session - 04/24/22 1329     Visit Number 26    Number of Visits 40    Date for PT Re-Evaluation 07/17/22    Authorization Type [employer]    Authorization - Visit Number 22    Authorization - Number of Visits 30    Progress Note Due on Visit 30    PT Start Time 1302    PT Stop Time 1340    PT Time Calculation (min) 38 min    Activity Tolerance Patient tolerated treatment well;No increased pain    Behavior During Therapy WFL for tasks assessed/performed              Past Medical History:  Diagnosis Date   Oral cancer ([place])    Scleroderma ([place])    Past Surgical History:  Procedure Laterality Date   ABDOMINAL HYSTERECTOMY     There are no problems to display for this patient.   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  PRECAUTIONS: none  SUBJECTIVE: Doing well to date. No pain. Reports compliance with HEP  PAIN:  Are you having pain? No     TODAY'S TREATMENT:  There.ex:    NuStep level 3 x 5 min to promote protraction/retraction Seat 7 UE 11   Attempted bear crawl pt able to do with knee support only x65f Bird dog x12 with good raise with cuing encouragement  Prone full superman 2x 10 with cuing for max ext; most difficulty    Bent over Y with 1# DB 2x 10 with good carry over of demo    Frequent seated rest breaks needed between exercises and sets due  to muscular fatigue.   Manual:   To reduce pain and tissue tension, improve range of motion, neuromodulation, in order to promote improved ability to complete functional activities.   STM to R side of ant neck (hardened area) In sidelying for patient comfort for 8 minutes     PATIENT EDUCATION: Education details: therex form/technique  Person educated: Patient Education method: EConsulting civil engineer Demonstration, and Verbal cues Education comprehension: verbalized understanding, returned demonstration, and verbal cues required   HOME EXERCISE PROGRAM: Y on wall Overhead press  Clinical Impression: PT reviewed goals this session where patient is making excellent progression toward goals. Patient has made big improvements in cervical and shoulder ROM  and in strength since last re-certification. Pt progress is slow but steady, with evident need for skilled PT to prevent regression and continue progression in mobility and strength. Pt is able to comply with all cuing for proper technique of progressive therex with excellent motivation throughout session. PT attempted bear crawl with patient unable to bear all of her weight through hands and feet only, but with excellent attempt. PT will continue progression as able.     PT Short Term Goals - 09/15/19 1119       PT SHORT TERM GOAL #1   Title Pt will be independent with HEP in  order to improve strength and decrease pain in order to improve pain-free function at home and work.    Baseline 08/04/19 Completing HEP regularly withotu question or concern    Time 4    Period Weeks    Status Achieved              PT Long Term Goals - 04/24/22 1332       PT LONG TERM GOAL #1   Title Pt will decrease quick DASH score by at least 8% in order to demonstrate clinically significant reduction in disability.    Baseline 06/11/19 56.8%; 08/04/19 34%    Time 8    Period Weeks    Status Deferred      PT LONG TERM GOAL #2   Title Patient will  demonstrate full cervical ROM in order to drive safely    Baseline 11/28/21: F 40, E 35, LFlex R 24, LFlex L 20, Rot R 21, Rot L 18 ; 04/24/22: F 40, E 38, LFlex R 41, LFlex L 39, Rot R 45, Rot L 33.    Time 8    Period Weeks    Status On-going      PT LONG TERM GOAL #3   Title Patient will demonstrate full R shoulder flex A/PROM in order to complete overhead ADLs    Baseline 11/17/19 AROM flex 120d PROM flex 145; PROM/AROM of ER and IR WNL; 09/19/20 AROM flex 140d PROM flex 167; 04/04/21 AROM flex 142d PROM flex 165; 07/26/21: AROM flex 125d PROM flex 142d; 11/28/21: flexion 155 active/ 160 passive 11/28/21: flexion 156 active/ 176 passive    Time 8    Period Weeks    Status Achieved      PT LONG TERM GOAL #4   Baseline 11/28/21: flexion 4/4, abduction 4-/4, ER 3+/4-, IR 4/4, Y 3+/3+, T 3+/3+ ; 11/28/21: R/L shoulder flexion 4/4+, abduction 4/4+, ER 4/4+, IR 4/4, Y 4/4, T 4+/4+    Time 8    Period Weeks    Status On-going                 [employer] DPT [place], PT 04/24/2022, 1:48 PM

## 2022-04-30 ENCOUNTER — Ambulatory Visit: Payer: Medicare Other | Admitting: Physical Therapy

## 2022-04-30 ENCOUNTER — Encounter: Payer: Self-pay | Admitting: Physical Therapy

## 2022-04-30 DIAGNOSIS — M25611 Stiffness of right shoulder, not elsewhere classified: Secondary | ICD-10-CM

## 2022-04-30 DIAGNOSIS — M6281 Muscle weakness (generalized): Secondary | ICD-10-CM

## 2022-04-30 NOTE — Therapy (Signed)
OUTPATIENT PHYSICAL THERAPY TREATMENT NOTE   Patient Name: Whitney Mclean MRN: [id number] DOB:1966/01/24, 56 y.o., female Today's Date: 04/30/2022  PCP: Emily Filbert MD REFERRING PROVIDER: Artis Delay, MD   PT End of Session - 04/30/22 1104     Visit Number 27    Number of Visits 40    Date for PT Re-Evaluation 07/17/22    Authorization Time Period 01/29/22 - 04/23/22    Authorization - Visit Number 55    Authorization - Number of Visits 30    Progress Note Due on Visit 30    PT Start Time 9417    PT Stop Time 1127    PT Time Calculation (min) 40 min    Activity Tolerance Patient tolerated treatment well;No increased pain    Behavior During Therapy WFL for tasks assessed/performed               Past Medical History:  Diagnosis Date   Oral cancer ([place])    Scleroderma ([place])    Past Surgical History:  Procedure Laterality Date   ABDOMINAL HYSTERECTOMY     There are no problems to display for this patient.   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  PRECAUTIONS: none  SUBJECTIVE: Doing well to date. No pain. Reports compliance with HEP  PAIN:  Are you having pain? No     TODAY'S TREATMENT:  There.ex:    NuStep level 3 x 5 min to promote protraction/retraction Seat 7 UE 11   Bird dog 2x12 with good raise with cuing encouragement  Y on theraball 2x 10 with good carry over of demo   Stir the pot on theraball x12 each direction with good carry over of demo   Frequent seated rest breaks needed between exercises and sets due to muscular fatigue.   Manual:   To reduce  pain and tissue tension, improve range of motion, neuromodulation, in order to promote improved ability to complete functional activities.   STM to R side of ant neck (hardened area) In sidelying for patient comfort for 8 minutes     PATIENT EDUCATION: Education details: therex form/technique  Person educated: Patient Education method: Consulting civil engineer, Demonstration, and Verbal cues Education comprehension: verbalized understanding, returned demonstration, and verbal cues required   HOME EXERCISE PROGRAM: Y on wall Overhead press  Clinical Impression: PT reviewed goals this session where patient is making excellent progression toward goals. PT continued progression for gravity dependent positions with success. Patient is able to complete all therex with proper technique and rest breaks utilized. PT will continue progression as able.     PT Short Term Goals - 09/15/19 1119       PT SHORT TERM GOAL #1   Title Pt will be independent with HEP in order to improve strength and decrease pain in order to improve pain-free function at home and work.    Baseline 08/04/19 Completing HEP regularly withotu question or concern    Time 4    Period Weeks    Status Achieved              PT Long Term Goals - 04/24/22 1332  PT LONG TERM GOAL #1   Title Pt will decrease quick DASH score by at least 8% in order to demonstrate clinically significant reduction in disability.    Baseline 06/11/19 56.8%; 08/04/19 34%    Time 8    Period Weeks    Status Deferred      PT LONG TERM GOAL #2   Title Patient will demonstrate full cervical ROM in order to drive safely    Baseline 11/28/21: F 40, E 35, LFlex R 24, LFlex L 20, Rot R 21, Rot L 18 ; 04/24/22: F 40, E 38, LFlex R 41, LFlex L 39, Rot R 45, Rot L 33.    Time 8    Period Weeks    Status On-going      PT LONG TERM GOAL #3   Title Patient will demonstrate full R shoulder flex A/PROM in order to complete overhead ADLs    Baseline 11/17/19  AROM flex 120d PROM flex 145; PROM/AROM of ER and IR WNL; 09/19/20 AROM flex 140d PROM flex 167; 04/04/21 AROM flex 142d PROM flex 165; 07/26/21: AROM flex 125d PROM flex 142d; 11/28/21: flexion 155 active/ 160 passive 11/28/21: flexion 156 active/ 176 passive    Time 8    Period Weeks    Status Achieved      PT LONG TERM GOAL #4   Baseline 11/28/21: flexion 4/4, abduction 4-/4, ER 3+/4-, IR 4/4, Y 3+/3+, T 3+/3+ ; 11/28/21: R/L shoulder flexion 4/4+, abduction 4/4+, ER 4/4+, IR 4/4, Y 4/4, T 4+/4+    Time 8    Period Weeks    Status On-going                 [employer] DPT [place] Reges, PT 04/30/2022, 12:50 PM

## 2022-05-02 ENCOUNTER — Encounter: Payer: Medicare Other | Admitting: Physical Therapy

## 2022-05-07 ENCOUNTER — Ambulatory Visit: Payer: Medicare Other | Admitting: Physical Therapy

## 2022-05-08 ENCOUNTER — Ambulatory Visit: Payer: Medicare Other | Admitting: Physical Therapy

## 2022-05-08 ENCOUNTER — Encounter: Payer: Self-pay | Admitting: Physical Therapy

## 2022-05-08 DIAGNOSIS — M25611 Stiffness of right shoulder, not elsewhere classified: Secondary | ICD-10-CM

## 2022-05-08 DIAGNOSIS — M542 Cervicalgia: Secondary | ICD-10-CM

## 2022-05-08 DIAGNOSIS — M6281 Muscle weakness (generalized): Secondary | ICD-10-CM | POA: Diagnosis not present

## 2022-05-08 NOTE — Therapy (Signed)
OUTPATIENT PHYSICAL THERAPY TREATMENT NOTE   Patient Name: Whitney Mclean MRN: [id number] DOB:11/07/1965, 56 y.o., female Today's Date: 05/08/2022  PCP: Emily Filbert MD REFERRING PROVIDER: Artis Delay, MD   PT End of Session - 05/08/22 1046     Visit Number 28    Number of Visits 40    Date for PT Re-Evaluation 07/17/22    Authorization Type BCBS    Authorization Time Period 01/29/22 - 04/23/22    Authorization - Visit Number 28    Authorization - Number of Visits 30    Progress Note Due on Visit 30    PT Start Time 4287    PT Stop Time 1123    PT Time Calculation (min) 40 min    Activity Tolerance Patient tolerated treatment well;No increased pain    Behavior During Therapy WFL for tasks assessed/performed                Past Medical History:  Diagnosis Date   Oral cancer ([place])    Scleroderma ([place])    Past Surgical History:  Procedure Laterality Date   ABDOMINAL HYSTERECTOMY     There are no problems to display for this patient.   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  PRECAUTIONS: none  SUBJECTIVE: Doing well to date. No pain. Reports compliance with HEP  PAIN:  Are you having pain? No     TODAY'S TREATMENT:  There.ex:    NuStep level 3 x 5 min to promote protraction/retraction Seat 7 UE 11   Bird dog 2x12 with good raise with cuing encouragement  Y on wall 1# DB  2x 12 with good carry over of demo   Squat with overhead PVC pipe maintained 3x 10 with good carry over of initial demo    Frequent seated rest breaks needed between exercises  and sets due to muscular fatigue.   Manual:   To reduce pain and tissue tension, improve range of motion, neuromodulation, in order to promote improved ability to complete functional activities.   STM to R side of ant neck (hardened area) In sidelying for patient comfort for 8 minutes     PATIENT EDUCATION: Education details: therex form/technique  Person educated: Patient Education method: Consulting civil engineer, Demonstration, and Verbal cues Education comprehension: verbalized understanding, returned demonstration, and verbal cues required   HOME EXERCISE PROGRAM: Y on wall Overhead press  Clinical Impression: PT continued therex porgression for increased cervical and thoracic, and BUE mobility with success (with extension focus). Pt is able to comply with all cuing for proper technique of all therex with good effort throughout session. PT will continue progression as able.     PT Short Term Goals - 09/15/19 1119       PT SHORT TERM GOAL #1   Title Pt will be independent with HEP in order to improve strength and decrease pain in order to improve pain-free function at home and work.    Baseline 08/04/19 Completing HEP regularly withotu question or concern    Time 4    Period Weeks    Status Achieved  PT Long Term Goals - 04/24/22 1332       PT LONG TERM GOAL #1   Title Pt will decrease quick DASH score by at least 8% in order to demonstrate clinically significant reduction in disability.    Baseline 06/11/19 56.8%; 08/04/19 34%    Time 8    Period Weeks    Status Deferred      PT LONG TERM GOAL #2   Title Patient will demonstrate full cervical ROM in order to drive safely    Baseline 11/28/21: F 40, E 35, LFlex R 24, LFlex L 20, Rot R 21, Rot L 18 ; 04/24/22: F 40, E 38, LFlex R 41, LFlex L 39, Rot R 45, Rot L 33.    Time 8    Period Weeks    Status On-going      PT LONG TERM GOAL #3   Title Patient will demonstrate full R shoulder flex A/PROM in order to  complete overhead ADLs    Baseline 11/17/19 AROM flex 120d PROM flex 145; PROM/AROM of ER and IR WNL; 09/19/20 AROM flex 140d PROM flex 167; 04/04/21 AROM flex 142d PROM flex 165; 07/26/21: AROM flex 125d PROM flex 142d; 11/28/21: flexion 155 active/ 160 passive 11/28/21: flexion 156 active/ 176 passive    Time 8    Period Weeks    Status Achieved      PT LONG TERM GOAL #4   Baseline 11/28/21: flexion 4/4, abduction 4-/4, ER 3+/4-, IR 4/4, Y 3+/3+, T 3+/3+ ; 11/28/21: R/L shoulder flexion 4/4+, abduction 4/4+, ER 4/4+, IR 4/4, Y 4/4, T 4+/4+    Time 8    Period Weeks    Status On-going                 [employer] DPT [place], PT 05/08/2022, 12:10 PM

## 2022-05-15 ENCOUNTER — Ambulatory Visit: Payer: Medicare Other | Admitting: Physical Therapy

## 2022-05-17 ENCOUNTER — Ambulatory Visit: Payer: Medicare Other | Admitting: Physical Therapy

## 2022-05-22 ENCOUNTER — Ambulatory Visit: Payer: Medicare Other | Admitting: Physical Therapy

## 2022-05-29 ENCOUNTER — Ambulatory Visit: Payer: Medicare Other | Admitting: Physical Therapy

## 2022-06-12 ENCOUNTER — Encounter: Payer: Self-pay | Admitting: Physical Therapy

## 2022-06-12 ENCOUNTER — Ambulatory Visit: Payer: Medicare Other | Attending: Internal Medicine | Admitting: Physical Therapy

## 2022-06-12 DIAGNOSIS — M6281 Muscle weakness (generalized): Secondary | ICD-10-CM | POA: Insufficient documentation

## 2022-06-12 DIAGNOSIS — M79641 Pain in right hand: Secondary | ICD-10-CM | POA: Diagnosis present

## 2022-06-12 DIAGNOSIS — M25611 Stiffness of right shoulder, not elsewhere classified: Secondary | ICD-10-CM | POA: Diagnosis present

## 2022-06-12 DIAGNOSIS — M542 Cervicalgia: Secondary | ICD-10-CM | POA: Diagnosis present

## 2022-06-12 NOTE — Therapy (Signed)
OUTPATIENT PHYSICAL THERAPY TREATMENT NOTE   Patient Name: Whitney Mclean MRN: [id number] DOB:Feb 08, 1966, 56 y.o., female Today's Date: 06/12/2022  PCP: Emily Filbert MD REFERRING PROVIDER: Artis Delay, MD   PT End of Session - 06/12/22 [id number]     Visit Number 29    Number of Visits 40    Date for PT Re-Evaluation 07/17/22    Authorization Type [employer]    Authorization Time Period 01/29/22 - 04/23/22    Authorization - Visit Number 28    Authorization - Number of Visits 30    Progress Note Due on Visit 30    PT Start Time 0915    PT Stop Time 0955    PT Time Calculation (min) 40 min    Activity Tolerance Patient tolerated treatment well;No increased pain    Behavior During Therapy WFL for tasks assessed/performed                 Past Medical History:  Diagnosis Date   Oral cancer ([place])    Scleroderma ([place])    Past Surgical History:  Procedure Laterality Date   ABDOMINAL HYSTERECTOMY     There are no problems to display for this patient.   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  PRECAUTIONS: none  SUBJECTIVE: More fatigued today due to being sick for 3 weeks. Saw her rheumatologist and he was pleased with her mobility progress. Has had trouble doing HEP d/t illness. No increased pain .  PAIN:  Are you having pain? No     TODAY'S TREATMENT:  There.ex:    NuStep level 3 x20mns; L2 262ms to promote protraction/retraction Seat 7 UE 11   Wall snow angels x12 with cuing for occiput on wall with decent carry over  Y on wall x12; 1# DB x12 with cuing to prevent excessive  thoracic ext   Alt superman x12; full superman x12 with encouragement for max height with good carry over    Squat with overhead PVC pipe maintained x12 with good carry over of initial demo    Frequent seated rest breaks needed between exercises and sets due to muscular fatigue.   Manual:   To reduce pain and tissue tension, improve range of motion, neuromodulation, in order to promote improved ability to complete functional activities.   STM to R side of ant neck (hardened area) In sidelying for patient comfort for 8 minutes     PATIENT EDUCATION: Education details: therex form/technique  Person educated: Patient Education method: ExConsulting civil engineerDemonstration, and Verbal cues Education comprehension: verbalized understanding, returned demonstration, and verbal cues required   HOME EXERCISE PROGRAM: Y on wall Overhead press  Clinical Impression: PT continued therex porgression for increased cervical and thoracic, and BUE mobility with success (with extension focus). Slight regression with increased rest breaks due to fatigue coming off of prolonged illness. Pt is able to comply with all cuing for proper technique of all therex with good effort throughout session. PT will continue progression as able.     PT Short Term Goals - 09/15/19 1119       PT SHORT TERM GOAL #1   Title Pt will be independent  with HEP in order to improve strength and decrease pain in order to improve pain-free function at home and work.    Baseline 08/04/19 Completing HEP regularly withotu question or concern    Time 4    Period Weeks    Status Achieved              PT Long Term Goals - 04/24/22 1332       PT LONG TERM GOAL #1   Title Pt will decrease quick DASH score by at least 8% in order to demonstrate clinically significant reduction in disability.    Baseline 06/11/19 56.8%; 08/04/19 34%    Time 8    Period Weeks    Status Deferred      PT LONG TERM GOAL #2   Title Patient will  demonstrate full cervical ROM in order to drive safely    Baseline 11/28/21: F 40, E 35, LFlex R 24, LFlex L 20, Rot R 21, Rot L 18 ; 04/24/22: F 40, E 38, LFlex R 41, LFlex L 39, Rot R 45, Rot L 33.    Time 8    Period Weeks    Status On-going      PT LONG TERM GOAL #3   Title Patient will demonstrate full R shoulder flex A/PROM in order to complete overhead ADLs    Baseline 11/17/19 AROM flex 120d PROM flex 145; PROM/AROM of ER and IR WNL; 09/19/20 AROM flex 140d PROM flex 167; 04/04/21 AROM flex 142d PROM flex 165; 07/26/21: AROM flex 125d PROM flex 142d; 11/28/21: flexion 155 active/ 160 passive 11/28/21: flexion 156 active/ 176 passive    Time 8    Period Weeks    Status Achieved      PT LONG TERM GOAL #4   Baseline 11/28/21: flexion 4/4, abduction 4-/4, ER 3+/4-, IR 4/4, Y 3+/3+, T 3+/3+ ; 11/28/21: R/L shoulder flexion 4/4+, abduction 4/4+, ER 4/4+, IR 4/4, Y 4/4, T 4+/4+    Time 8    Period Weeks    Status On-going                 [employer] DPT Durwin Reges, PT 06/12/2022, 9:56 AM

## 2022-06-19 ENCOUNTER — Ambulatory Visit: Payer: Medicare Other

## 2022-06-19 DIAGNOSIS — M6281 Muscle weakness (generalized): Secondary | ICD-10-CM | POA: Diagnosis not present

## 2022-06-19 DIAGNOSIS — M25611 Stiffness of right shoulder, not elsewhere classified: Secondary | ICD-10-CM

## 2022-06-19 DIAGNOSIS — M542 Cervicalgia: Secondary | ICD-10-CM

## 2022-06-19 DIAGNOSIS — M79641 Pain in right hand: Secondary | ICD-10-CM

## 2022-06-19 NOTE — Therapy (Signed)
OUTPATIENT PHYSICAL THERAPY TREATMENT NOTE Physical Therapy Progress Note    Dates of reporting period  03/06/22   to   06/19/22    Patient Name: Whitney Mclean MRN: [id number] DOB:February 22, 1966, 56 y.o., female Today's Date: 06/19/2022  PCP: Emily Filbert MD REFERRING PROVIDER: Artis Delay, MD   PT End of Session - 06/19/22 1137     Visit Number 30    Number of Visits 40    Date for PT Re-Evaluation 07/17/22    Authorization Type BCBS    PT Start Time 1130    PT Stop Time 1210    PT Time Calculation (min) 40 min    Equipment Utilized During Treatment Gait belt    Activity Tolerance Patient tolerated treatment well;No increased pain    Behavior During Therapy WFL for tasks assessed/performed                 Past Medical History:  Diagnosis Date   Oral cancer ([place])    Scleroderma ([place])    Past Surgical History:  Procedure Laterality Date   ABDOMINAL HYSTERECTOMY     There are no problems to display for this patient.   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  PRECAUTIONS: none  SUBJECTIVE: Pt starting to turn a corner in feeling improved from illness, but she is noticing deconditioning in her shoulder.    PAIN:  Are you having pain? No   TODAY'S TREATMENT:  There.ex:    NuStep level 3 x5 minutes to promote protraction/retraction Seat 7 UE 11   Wall snow angels x12 with cuing for occiput on wall with decent carry over  Y on wall x12; 1# DB x12 with cuing to prevent excessive thoracic ext Y on wall x12; 1# DB x12 with cuing to  prevent excessive thoracic ext Quadruped alternate UE taps 1x20  Quadruped fire hydrants (horizontal hip ABDCT)     Squat with overhead PVC pipe maintained x12 with good carry over of initial demo    Sustained release to Right lateral neck, cervical extensors, and, upper traps x10 minutes    PATIENT EDUCATION: Education details: therex form/technique  Person educated: Patient Education method: Consulting civil engineer, Media planner, and Verbal cues Education comprehension: verbalized understanding, returned demonstration, and verbal cues required   HOME EXERCISE PROGRAM: Y on wall Overhead press    PT Short Term Goals - 09/15/19 1119       PT SHORT TERM GOAL #1   Title Pt will be independent with HEP in order to improve strength and decrease pain in order to improve pain-free function at home and work.    Baseline 08/04/19 Completing HEP regularly withotu question or concern    Time 4    Period Weeks    Status Achieved              PT Long Term Goals - 04/24/22 1332       PT LONG TERM GOAL #1   Title Pt will decrease quick DASH score by at least 8% in order to demonstrate clinically significant reduction in disability.    Baseline 06/11/19 56.8%; 08/04/19 34%  Time 8    Period Weeks    Status Deferred      PT LONG TERM GOAL #2   Title Patient will demonstrate full cervical ROM in order to drive safely    Baseline 11/28/21: F 40, E 35, LFlex R 24, LFlex L 20, Rot R 21, Rot L 18 ; 04/24/22: F 40, E 38, LFlex R 41, LFlex L 39, Rot R 45, Rot L 33.    Time 8    Period Weeks    Status On-going      PT LONG TERM GOAL #3   Title Patient will demonstrate full R shoulder flex A/PROM in order to complete overhead ADLs    Baseline 11/17/19 AROM flex 120d PROM flex 145; PROM/AROM of ER and IR WNL; 09/19/20 AROM flex 140d PROM flex 167; 04/04/21 AROM flex 142d PROM flex 165; 07/26/21: AROM flex 125d PROM flex 142d; 11/28/21: flexion 155 active/ 160 passive 11/28/21: flexion 156 active/ 176  passive    Time 8    Period Weeks    Status Achieved      PT LONG TERM GOAL #4   Baseline 11/28/21: flexion 4/4, abduction 4-/4, ER 3+/4-, IR 4/4, Y 3+/3+, T 3+/3+ ; 11/28/21: R/L shoulder flexion 4/4+, abduction 4/4+, ER 4/4+, IR 4/4, Y 4/4, T 4+/4+    Time 8    Period Weeks    Status On-going            Plan - 04/16/22 1116       Clinical Impression Statement Pt continues to show gradual progression in mobility of soft tissue as well as ROM and motor control. Strength training and motor control training well tolerate overall, no. Manual therapy directed toward areas of sift tissue rigidity. ROM restrictions remain profound, pt will continue to benefit from skilled PT intervention to address these deficits and impairments.     Personal Factors and Comorbidities Age;Behavior Pattern;Comorbidity 1;Comorbidity 2;Past/Current Experience;Fitness;Sex;Time since onset of injury/illness/exacerbation     Comorbidities scleroderma, cancer     Examination-Activity Limitations Reach Overhead;Self Feeding;Dressing;Sleep;Lift     Examination-Participation Restrictions Laundry;Cleaning;Community Activity;Driving     Stability/Clinical Decision Making Evolving/Moderate complexity     Clinical Decision Making Moderate     Rehab Potential Good     PT Frequency 1x / week     PT Duration 8 weeks     PT Treatment/Interventions ADLs/Self Care Home Management;Aquatic Therapy;Moist Heat;Traction;Ultrasound;Cryotherapy;Parrafin;Functional mobility training;Neuromuscular re-education;Taping;Dry needling;Passive range of motion;Joint Manipulations;Spinal Manipulations;Patient/family education;Therapeutic exercise;Manual techniques;Therapeutic activities;Electrical Stimulation;Fluidtherapy;Iontophoresis '4mg'$ /ml Dexamethasone     PT Next Visit Plan Increase cervical ROM; postural remediation;     PT Home Exercise Plan no updates     Consulted and Agree with Plan of Care     11:46 AM, 06/19/22 Etta Grandchild, PT,  DPT Physical Therapist - [place] [phone] (Office)   Chella Chapdelaine C, PT 06/19/2022, 11:46 AM

## 2022-06-26 ENCOUNTER — Ambulatory Visit: Payer: Medicare Other | Attending: Internal Medicine | Admitting: Physical Therapy

## 2022-06-26 ENCOUNTER — Encounter: Payer: Self-pay | Admitting: Physical Therapy

## 2022-06-26 DIAGNOSIS — M25611 Stiffness of right shoulder, not elsewhere classified: Secondary | ICD-10-CM

## 2022-06-26 DIAGNOSIS — M542 Cervicalgia: Secondary | ICD-10-CM

## 2022-06-26 DIAGNOSIS — M6281 Muscle weakness (generalized): Secondary | ICD-10-CM

## 2022-06-26 NOTE — Therapy (Signed)
OUTPATIENT PHYSICAL THERAPY TREATMENT NOTE Physical Therapy Progress Note    Dates of reporting period  03/06/22   to   06/19/22    Patient Name: Whitney Mclean MRN: [id number] DOB:[birth year], 56 y.o., female Today's Date: 06/26/2022  PCP: Emily Filbert MD REFERRING PROVIDER: Artis Delay, MD   PT End of Session - 06/26/22 1057     Visit Number 31    Number of Visits 40    Date for PT Re-Evaluation 07/17/22    Authorization Type BCBS    Authorization Time Period 01/29/22 - 04/23/22    Authorization - Visit Number 75    Authorization - Number of Visits 30    Progress Note Due on Visit 30    PT Start Time 1045    PT Stop Time 1125    PT Time Calculation (min) 40 min    Equipment Utilized During Treatment Gait belt    Activity Tolerance Patient tolerated treatment well;No increased pain    Behavior During Therapy WFL for tasks assessed/performed                  Past Medical History:  Diagnosis Date   Oral cancer ([place])    Scleroderma ([place])    Past Surgical History:  Procedure Laterality Date   ABDOMINAL HYSTERECTOMY     There are no problems to display for this patient.   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  PRECAUTIONS: none  SUBJECTIVE: Pt starting to turn a corner in feeling improved from illness, but she is noticing deconditioning in her shoulder.    PAIN:  Are you having pain? No   TODAY'S TREATMENT:  There.ex:    NuStep level 3 x5 minutes to promote protraction/retraction Seat 7 UE 11   Wall snow angels x12 with cuing for occiput on wall  with decent carry over  Y on wall 2# DB 2x 10/8 with min cuing for technique without lumbar ext with good carry over    Squat with overhead PVC pipe maintained 2 x12 with good carry over of initial demo    Quadruped alternate UE taps 2x 12  Manual:   To reduce pain and tissue tension, improve range of motion, neuromodulation, in order to promote improved ability to complete functional activities.   STM to R side of ant neck (hardened area) In sidelying for patient comfort Cervical PROM all directions   PATIENT EDUCATION: Education details: therex form/technique  Person educated: Patient Education method: Consulting civil engineer, Demonstration, and Verbal cues Education comprehension: verbalized understanding, returned demonstration, and verbal cues required   HOME EXERCISE PROGRAM: Y on wall Overhead press    PT Short Term Goals - 09/15/19 1119       PT SHORT TERM GOAL #1   Title Pt will be independent with HEP in order to improve strength and decrease pain in order to improve pain-free function at home and work.    Baseline 08/04/19 Completing HEP regularly withotu question or concern    Time 4    Period Weeks    Status Achieved              PT Long  Term Goals - 04/24/22 1332       PT LONG TERM GOAL #1   Title Pt will decrease quick DASH score by at least 8% in order to demonstrate clinically significant reduction in disability.    Baseline 06/11/19 56.8%; 08/04/19 34%    Time 8    Period Weeks    Status Deferred      PT LONG TERM GOAL #2   Title Patient will demonstrate full cervical ROM in order to drive safely    Baseline 11/28/21: F 40, E 35, LFlex R 24, LFlex L 20, Rot R 21, Rot L 18 ; 04/24/22: F 40, E 38, LFlex R 41, LFlex L 39, Rot R 45, Rot L 33.    Time 8    Period Weeks    Status On-going      PT LONG TERM GOAL #3   Title Patient will demonstrate full R shoulder flex A/PROM in order to complete overhead ADLs    Baseline 11/17/19 AROM flex 120d PROM flex 145;  PROM/AROM of ER and IR WNL; 09/19/20 AROM flex 140d PROM flex 167; 04/04/21 AROM flex 142d PROM flex 165; 07/26/21: AROM flex 125d PROM flex 142d; 11/28/21: flexion 155 active/ 160 passive 11/28/21: flexion 156 active/ 176 passive    Time 8    Period Weeks    Status Achieved      PT LONG TERM GOAL #4   Baseline 11/28/21: flexion 4/4, abduction 4-/4, ER 3+/4-, IR 4/4, Y 3+/3+, T 3+/3+ ; 11/28/21: R/L shoulder flexion 4/4+, abduction 4/4+, ER 4/4+, IR 4/4, Y 4/4, T 4+/4+    Time 8    Period Weeks    Status On-going            Plan - 04/16/22 1116       Clinical Impression Statement Pt continues to show gradual progression in mobility of soft tissue as well as ROM and motor control. Strength training and motor control training well tolerate overall, no. Manual therapy directed toward areas of sift tissue rigidity. ROM restrictions remain profound, pt will continue to benefit from skilled PT intervention to address these deficits and impairments.     Personal Factors and Comorbidities Age;Behavior Pattern;Comorbidity 1;Comorbidity 2;Past/Current Experience;Fitness;Sex;Time since onset of injury/illness/exacerbation     Comorbidities scleroderma, cancer     Examination-Activity Limitations Reach Overhead;Self Feeding;Dressing;Sleep;Lift     Examination-Participation Restrictions Laundry;Cleaning;Community Activity;Driving     Stability/Clinical Decision Making Evolving/Moderate complexity     Clinical Decision Making Moderate     Rehab Potential Good     PT Frequency 1x / week     PT Duration 8 weeks     PT Treatment/Interventions ADLs/Self Care Home Management;Aquatic Therapy;Moist Heat;Traction;Ultrasound;Cryotherapy;Parrafin;Functional mobility training;Neuromuscular re-education;Taping;Dry needling;Passive range of motion;Joint Manipulations;Spinal Manipulations;Patient/family education;Therapeutic exercise;Manual techniques;Therapeutic activities;Electrical Stimulation;Fluidtherapy;Iontophoresis  '4mg'$ /ml Dexamethasone     PT Next Visit Plan Increase cervical ROM; postural remediation;     PT Home Exercise Plan no updates     Consulted and Agree with Plan of Care     1:37 PM, 06/26/22 Etta Grandchild, PT, DPT Physical Therapist - [place] [phone] (Office)   Durwin Reges, PT 06/26/2022, 1:37 PM

## 2022-07-03 ENCOUNTER — Encounter: Payer: Self-pay | Admitting: Physical Therapy

## 2022-07-03 ENCOUNTER — Ambulatory Visit: Payer: Medicare Other | Admitting: Physical Therapy

## 2022-07-03 DIAGNOSIS — M6281 Muscle weakness (generalized): Secondary | ICD-10-CM | POA: Diagnosis not present

## 2022-07-03 NOTE — Therapy (Signed)
OUTPATIENT PHYSICAL THERAPY TREATMENT NOTE Physical Therapy Progress Note    Dates of reporting period  03/06/22   to   06/19/22    Patient Name: Whitney Mclean MRN: [id number] DOB:[birth year], 56 y.o., female Today's Date: 07/03/2022  PCP: Emily Filbert MD REFERRING PROVIDER: Artis Delay, MD   PT End of Session - 07/03/22 1138     Visit Number 27    Number of Visits 40    Date for PT Re-Evaluation 07/17/22    Authorization Type BCBS    Authorization Time Period 01/29/22 - 04/23/22    Authorization - Visit Number 11    Authorization - Number of Visits 30    Progress Note Due on Visit 30    PT Start Time 1125    PT Stop Time 1203    PT Time Calculation (min) 38 min    Equipment Utilized During Treatment Gait belt    Activity Tolerance Patient tolerated treatment well;No increased pain    Behavior During Therapy WFL for tasks assessed/performed                   Past Medical History:  Diagnosis Date   Oral cancer ([place])    Scleroderma ([place])    Past Surgical History:  Procedure Laterality Date   ABDOMINAL HYSTERECTOMY     There are no problems to display for this patient.   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  PRECAUTIONS: none  SUBJECTIVE: Pt starting to turn a corner in feeling improved from illness, but she is noticing deconditioning in her shoulder.    PAIN:  Are you having pain? No   TODAY'S TREATMENT:  There.ex:    NuStep level 3 x5 minutes to promote protraction/retraction Seat 7 UE 11   Wall snow angels 2 x12 with cuing for occiput on wall with decent carry over  Prone swimming 2x 10 with cuing for  scapular retraction with good carry over  Quadruped alternate UE taps 2x 12    Squat with overhead press 2# DB 2 x12 with good carry over of initial demo      Manual:   To reduce pain and tissue tension, improve range of motion, neuromodulation, in order to promote improved ability to complete functional activities.   STM to R side of ant neck (hardened area) In sidelying for patient comfort Cervical PROM all directions   PATIENT EDUCATION: Education details: therex form/technique  Person educated: Patient Education method: Consulting civil engineer, Demonstration, and Verbal cues Education comprehension: verbalized understanding, returned demonstration, and verbal cues required   HOME EXERCISE PROGRAM: Y on wall Overhead press    PT Short Term Goals - 09/15/19 1119       PT SHORT TERM GOAL #1   Title Pt will be independent with HEP in order to improve strength and decrease pain in order to improve pain-free function at home and work.    Baseline 08/04/19 Completing HEP regularly withotu question or concern    Time 4    Period Weeks    Status Achieved              PT Long Term Goals - 04/24/22 1332       PT  LONG TERM GOAL #1   Title Pt will decrease quick DASH score by at least 8% in order to demonstrate clinically significant reduction in disability.    Baseline 06/11/19 56.8%; 08/04/19 34%    Time 8    Period Weeks    Status Deferred      PT LONG TERM GOAL #2   Title Patient will demonstrate full cervical ROM in order to drive safely    Baseline 11/28/21: F 40, E 35, LFlex R 24, LFlex L 20, Rot R 21, Rot L 18 ; 04/24/22: F 40, E 38, LFlex R 41, LFlex L 39, Rot R 45, Rot L 33.    Time 8    Period Weeks    Status On-going      PT LONG TERM GOAL #3   Title Patient will demonstrate full R shoulder flex A/PROM in order to complete overhead ADLs    Baseline 11/17/19 AROM flex 120d PROM flex 145; PROM/AROM of ER and IR WNL; 09/19/20 AROM flex 140d PROM flex 167; 04/04/21 AROM  flex 142d PROM flex 165; 07/26/21: AROM flex 125d PROM flex 142d; 11/28/21: flexion 155 active/ 160 passive 11/28/21: flexion 156 active/ 176 passive    Time 8    Period Weeks    Status Achieved      PT LONG TERM GOAL #4   Baseline 11/28/21: flexion 4/4, abduction 4-/4, ER 3+/4-, IR 4/4, Y 3+/3+, T 3+/3+ ; 11/28/21: R/L shoulder flexion 4/4+, abduction 4/4+, ER 4/4+, IR 4/4, Y 4/4, T 4+/4+    Time 8    Period Weeks    Status On-going            Plan - 04/16/22 1116       Clinical Impression Statement Pt continues to show gradual progression in mobility of soft tissue as well as ROM and motor control. Strength training and motor control training well tolerate overall, no. Manual therapy directed toward areas of sift tissue rigidity. ROM restrictions remain profound, pt will continue to benefit from skilled PT intervention to address these deficits and impairments.     Personal Factors and Comorbidities Age;Behavior Pattern;Comorbidity 1;Comorbidity 2;Past/Current Experience;Fitness;Sex;Time since onset of injury/illness/exacerbation     Comorbidities scleroderma, cancer     Examination-Activity Limitations Reach Overhead;Self Feeding;Dressing;Sleep;Lift     Examination-Participation Restrictions Laundry;Cleaning;Community Activity;Driving     Stability/Clinical Decision Making Evolving/Moderate complexity     Clinical Decision Making Moderate     Rehab Potential Good     PT Frequency 1x / week     PT Duration 8 weeks     PT Treatment/Interventions ADLs/Self Care Home Management;Aquatic Therapy;Moist Heat;Traction;Ultrasound;Cryotherapy;Parrafin;Functional mobility training;Neuromuscular re-education;Taping;Dry needling;Passive range of motion;Joint Manipulations;Spinal Manipulations;Patient/family education;Therapeutic exercise;Manual techniques;Therapeutic activities;Electrical Stimulation;Fluidtherapy;Iontophoresis '4mg'$ /ml Dexamethasone     PT Next Visit Plan Increase cervical ROM; postural  remediation;     PT Home Exercise Plan no updates     Consulted and Agree with Plan of [place] DPT Durwin Reges, PT 07/03/2022, 12:06 PM

## 2022-07-17 ENCOUNTER — Ambulatory Visit: Payer: Medicare Other | Admitting: Physical Therapy

## 2022-07-17 ENCOUNTER — Encounter: Payer: Self-pay | Admitting: Physical Therapy

## 2022-07-17 DIAGNOSIS — M6281 Muscle weakness (generalized): Secondary | ICD-10-CM | POA: Diagnosis not present

## 2022-07-17 DIAGNOSIS — M542 Cervicalgia: Secondary | ICD-10-CM

## 2022-07-17 DIAGNOSIS — M25611 Stiffness of right shoulder, not elsewhere classified: Secondary | ICD-10-CM

## 2022-07-17 NOTE — Therapy (Signed)
OUTPATIENT PHYSICAL THERAPY TREATMENT NOTE Physical Therapy Progress Note    Dates of reporting period  03/06/22   to   06/19/22    Patient Name: Whitney Mclean MRN: [id number] DOB:Jan 14, 1966, 56 y.o., female Today's Date: 07/17/2022  PCP: Whitney Filbert MD REFERRING PROVIDER: Artis Delay, MD   PT End of Session - 07/17/22 1126     Visit Number 28    Number of Visits 30    Date for PT Re-Evaluation 07/17/22    Authorization Type BCBS    Authorization Time Period 01/29/22 - 04/23/22    Authorization - Visit Number 28    Authorization - Number of Visits 30    Progress Note Due on Visit 30    PT Start Time 3762    PT Stop Time 1203    PT Time Calculation (min) 39 min    Activity Tolerance Patient tolerated treatment well;No increased pain    Behavior During Therapy WFL for tasks assessed/performed                    Past Medical History:  Diagnosis Date   Oral cancer ([place])    Scleroderma ([place])    Past Surgical History:  Procedure Laterality Date   ABDOMINAL HYSTERECTOMY     There are no problems to display for this patient.   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  PRECAUTIONS: none  SUBJECTIVE: Pt starting to turn a corner in feeling improved from illness, but she is noticing deconditioning in her shoulder.    PAIN:  Are you having pain? No   TODAY'S TREATMENT:  There.ex:    NuStep level 3 x5 minutes to promote protraction/retraction Seat 7 UE 11   Y on wall with GTB 2x 10 with cuing for technique with good carry over  B[employer] 2x 12 with no  tactile stabilization needed; supervision for safety    6# ball slam 2x 5 with cuing for full overhead motion with good carry over    Manual:   To reduce pain and tissue tension, improve range of motion, neuromodulation, in order to promote improved ability to complete functional activities.   STM to R side of ant neck (hardened area) In sidelying for patient comfort Cervical PROM all directions   PATIENT EDUCATION: Education details: therex form/technique  Person educated: Patient Education method: Consulting civil engineer, Demonstration, and Verbal cues Education comprehension: verbalized understanding, returned demonstration, and verbal cues required   HOME EXERCISE PROGRAM: Y on wall Overhead press    PT Short Term Goals - 09/15/19 1119       PT SHORT TERM GOAL #1   Title Pt will be independent with HEP in order to improve strength and decrease pain in order to improve pain-free function at home and work.    Baseline 08/04/19 Completing HEP regularly withotu question or concern    Time 4    Period Weeks    Status Achieved              PT Long Term Goals - 04/24/22 1332       PT LONG TERM GOAL #1   Title Pt will  decrease quick DASH score by at least 8% in order to demonstrate clinically significant reduction in disability.    Baseline 06/11/19 56.8%; 08/04/19 34%    Time 8    Period Weeks    Status Deferred      PT LONG TERM GOAL #2   Title Patient will demonstrate full cervical ROM in order to drive safely    Baseline 11/28/21: F 40, E 35, LFlex R 24, LFlex L 20, Rot R 21, Rot L 18 ; 04/24/22: F 40, E 38, LFlex R 41, LFlex L 39, Rot R 45, Rot L 33.    Time 8    Period Weeks    Status On-going      PT LONG TERM GOAL #3   Title Patient will demonstrate full R shoulder flex A/PROM in order to complete overhead ADLs    Baseline 11/17/19 AROM flex 120d PROM flex 145; PROM/AROM of ER and IR WNL; 09/19/20 AROM flex 140d PROM flex 167; 04/04/21 AROM flex 142d PROM flex 165;  07/26/21: AROM flex 125d PROM flex 142d; 11/28/21: flexion 155 active/ 160 passive 11/28/21: flexion 156 active/ 176 passive    Time 8    Period Weeks    Status Achieved      PT LONG TERM GOAL #4   Baseline 11/28/21: flexion 4/4, abduction 4-/4, ER 3+/4-, IR 4/4, Y 3+/3+, T 3+/3+ ; 11/28/21: R/L shoulder flexion 4/4+, abduction 4/4+, ER 4/4+, IR 4/4, Y 4/4, T 4+/4+    Time 8    Period Weeks    Status On-going            Plan - 04/16/22 1116       Clinical Impression Statement Pt continues to show gradual progression in mobility of soft tissue as well as ROM and motor control. Strength training and motor control training well tolerate overall, no. Manual therapy directed toward areas of sift tissue rigidity. ROM restrictions remain profound, pt will continue to benefit from skilled PT intervention to address these deficits and impairments.     Personal Factors and Comorbidities Age;Behavior Pattern;Comorbidity 1;Comorbidity 2;Past/Current Experience;Fitness;Sex;Time since onset of injury/illness/exacerbation     Comorbidities scleroderma, cancer     Examination-Activity Limitations Reach Overhead;Self Feeding;Dressing;Sleep;Lift     Examination-Participation Restrictions Laundry;Cleaning;Community Activity;Driving     Stability/Clinical Decision Making Evolving/Moderate complexity     Clinical Decision Making Moderate     Rehab Potential Good     PT Frequency 1x / week     PT Duration 8 weeks     PT Treatment/Interventions ADLs/Self Care Home Management;Aquatic Therapy;Moist Heat;Traction;Ultrasound;Cryotherapy;Parrafin;Functional mobility training;Neuromuscular re-education;Taping;Dry needling;Passive range of motion;Joint Manipulations;Spinal Manipulations;Patient/family education;Therapeutic exercise;Manual techniques;Therapeutic activities;Electrical Stimulation;Fluidtherapy;Iontophoresis '4mg'$ /ml Dexamethasone     PT Next Visit Plan Increase cervical ROM; postural remediation;     PT Home  Exercise Plan no updates     Consulted and Agree with Plan of Whitney Mclean, PT 07/17/2022, 12:04 PM

## 2022-07-24 ENCOUNTER — Encounter: Payer: Medicare Other | Admitting: Physical Therapy

## 2022-07-31 ENCOUNTER — Encounter: Payer: Self-pay | Admitting: Physical Therapy

## 2022-07-31 ENCOUNTER — Ambulatory Visit: Payer: Medicare Other | Attending: Internal Medicine | Admitting: Physical Therapy

## 2022-07-31 DIAGNOSIS — M6281 Muscle weakness (generalized): Secondary | ICD-10-CM | POA: Insufficient documentation

## 2022-07-31 NOTE — Therapy (Signed)
OUTPATIENT PHYSICAL THERAPY SCREEN   Dates of reporting period  03/06/22   to   06/19/22    Patient Name: Whitney Mclean MRN: [id number] DOB:[birth year], 56 y.o., female Today's Date: 07/31/2022  PCP: Emily Filbert MD REFERRING PROVIDER: Artis Delay, MD   PT End of Session - 07/31/22 1045     Visit Number 28    Number of Visits 30    Date for PT Re-Evaluation 07/17/22    Authorization Type BCBS    Authorization Time Period 01/29/22 - 04/23/22    Authorization - Visit Number 28    Authorization - Number of Visits 30    Progress Note Due on Visit 30    PT Start Time 0350    PT Stop Time 1125    PT Time Calculation (min) 40 min    Activity Tolerance Patient tolerated treatment well;No increased pain    Behavior During Therapy WFL for tasks assessed/performed                    Past Medical History:  Diagnosis Date   Oral cancer ([place])    Scleroderma ([place])    Past Surgical History:  Procedure Laterality Date   ABDOMINAL HYSTERECTOMY     There are no problems to display for this patient.   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  PRECAUTIONS: none  SUBJECTIVE: Pt starting to turn a corner in feeling improved from illness, but she is noticing deconditioning in her shoulder.    PAIN:  Are you having pain? No   TODAY'S TREATMENT:  There.ex:    Goal review and re-assessment to determine need for further PT services NuStep level 3 x5 minutes to promote protraction/retraction Seat 7 UE 11   Access Code: [email or web address] - Standing Low Trap Setting with Resistance at [place]  - 1 x daily - 2 x weekly - 3 sets - 8-12 reps - Squat with  Overhead Press  - 1 x daily - 2 x weekly - 3 sets - 8-12 reps - Bird Dog  - 1 x daily - 2 x weekly - 3 sets - 12-20 reps - Forearm Plank on Wall  - 1 x daily - 7 x weekly - 3 reps - 30sec hold  Manual:   To reduce pain and tissue tension, improve range of motion, neuromodulation, in order to promote improved ability to complete functional activities.   STM to R side of ant neck (hardened area) In sidelying for patient comfort Cervical PROM all directions   PATIENT EDUCATION: Education details: therex form/technique  Person educated: Patient Education method: Consulting civil engineer, Demonstration, and Verbal cues Education comprehension: verbalized understanding, returned demonstration, and verbal cues required   HOME EXERCISE PROGRAM: Access Code: [email or web address] - Standing Low Trap Setting with Resistance at [place]  - 1 x daily - 2 x weekly - 3 sets - 8-12 reps - Squat with Overhead Press  - 1 x daily - 2 x weekly - 3 sets - 8-12 reps - Bird Dog  - 1 x daily - 2 x weekly - 3 sets - 12-20 reps - Forearm Plank on Wall  - 1 x daily - 7 x weekly - 3 reps -  30sec hold   Clinical Impression: PT screened patient for goal re-assessment to determine if skilled PT services continue to be needed. With chronicity of condition, patient progress is slow, but evident. Due to nature of prognosis, PT is also beneficial for maintenance of independent function. Pt will continue to benefit from skilled PT to make possible progress and maintain as much independence as able as scleraderma progresses.    Short Term Goals: 1.  Pt will be independent with HEP in order to improve strength and mobility in order to improve function in ADLs.  Baseline: 07/31/22 New HEP updated Goal status: INITIAL  Long Term Goals: 1.  Pt will be able to demonstrate plank of 30sec to demonstrate increased general stability endurance for progress toward age-matched norm of 65mn Baseline: 07/31/22 4sec Goal status: INITIAL  2.  Patient will  demonstrate full cervical rotation AROM in order to drive safely  Baseline: 04/24/22:  Rot R 45, Rot L 33.; 07/31/22 R/L Rotation 54/34  Goal status: IN PROGRESS  3.  Patient will demonstrate full R shoulder flex A/PROM in order to complete overhead ADLs  Baseline: 07/31/22: flexion 157 active/ 175 passive  Goal status: IN PROGRESS     CDurwin RegesDPT CDurwin Reges PT 07/31/2022, 11:46 AM

## 2022-08-07 ENCOUNTER — Encounter: Payer: Medicare Other | Admitting: Physical Therapy

## 2022-08-14 ENCOUNTER — Encounter: Payer: Self-pay | Admitting: Physical Therapy

## 2022-08-14 ENCOUNTER — Ambulatory Visit: Payer: Medicare Other | Admitting: Physical Therapy

## 2022-08-14 DIAGNOSIS — M6281 Muscle weakness (generalized): Secondary | ICD-10-CM

## 2022-08-14 NOTE — Therapy (Signed)
OUTPATIENT PHYSICAL THERAPY TREATMENT   Dates of reporting period  03/06/22   to   06/19/22    Patient Name: Whitney Mclean MRN: [id number] DOB:08/19/1966, 56 y.o., female Today's Date: 08/14/2022  PCP: Emily Filbert MD REFERRING PROVIDER: Artis Delay, MD   PT End of Session - 08/14/22 1136     Visit Number 29    Number of Visits 30    Date for PT Re-Evaluation 10/09/22    Authorization - Visit Number 29    Authorization - Number of Visits 30    Progress Note Due on Visit 30    PT Start Time 1133    PT Stop Time 1211    PT Time Calculation (min) 38 min    Equipment Utilized During Treatment Gait belt    Activity Tolerance Patient tolerated treatment well;No increased pain    Behavior During Therapy WFL for tasks assessed/performed                     Past Medical History:  Diagnosis Date   Oral cancer ([place])    Scleroderma ([place])    Past Surgical History:  Procedure Laterality Date   ABDOMINAL HYSTERECTOMY     There are no problems to display for this patient.   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  PRECAUTIONS: none  SUBJECTIVE: Pt starting to turn a corner in feeling improved from illness, but she is noticing deconditioning in her shoulder.    PAIN:  Are you having pain? No   TODAY'S TREATMENT:  There.ex:  NuStep level 3 x5 minutes to promote protraction/retraction Seat 7 UE 11  High plank on wall alt diagonal scap taps RTB 3x 12 with good carry over of demo  STS with 3# DB overhead raise 2x 10 with cuing for full overhead with good carry over   Birddog 2x 12 (6 each side) with good carry over of  technique  Manual:   To reduce pain and tissue tension, improve range of motion, neuromodulation, in order to promote improved ability to complete functional activities.   STM to R side of ant neck (hardened area) In sidelying for patient comfort Cervical PROM all directions   PATIENT EDUCATION: Education details: therex form/technique  Person educated: Patient Education method: Consulting civil engineer, Demonstration, and Verbal cues Education comprehension: verbalized understanding, returned demonstration, and verbal cues required   HOME EXERCISE PROGRAM: Access Code: [email or web address] - Standing Low Trap Setting with Resistance at [place]  - 1 x daily - 2 x weekly - 3 sets - 8-12 reps - Squat with Overhead Press  - 1 x daily - 2 x weekly - 3 sets - 8-12 reps - Bird Dog  - 1 x daily - 2 x weekly - 3 sets - 12-20 reps - Forearm Plank on Wall  - 1 x daily - 7 x weekly - 3 reps - 30sec hold   Clinical Impression: PT continued progression for increased overhead mobility strength, and cervical ext/rotation with success. Patient demonstrates good effort throughout session with no increased pain. Patient is able to comply with all cuing for proper technique of therex with multimodal cuing. PT will continue progression as able.    Short  Term Goals: 1.  Pt will be independent with HEP in order to improve strength and mobility in order to improve function in ADLs.  Baseline: 07/31/22 New HEP updated Goal status: INITIAL  Long Term Goals: 1.  Pt will be able to demonstrate plank of 30sec to demonstrate increased general stability endurance for progress toward age-matched norm of 83mn Baseline: 07/31/22 4sec Goal status: INITIAL  2.  Patient will demonstrate full cervical rotation AROM in order to drive safely  Baseline: 04/24/22:  Rot R 45, Rot L 33.; 07/31/22 R/L Rotation 54/34  Goal status: IN PROGRESS  3.  Patient will demonstrate full R shoulder flex A/PROM in order to complete overhead ADLs  Baseline:  07/31/22: flexion 157 active/ 175 passive  Goal status: IN PROGRESS     CDurwin RegesDPT CDurwin Reges PT 08/14/2022, 2:07 PM

## 2022-08-21 ENCOUNTER — Encounter: Payer: Medicare Other | Admitting: Physical Therapy

## 2022-08-27 ENCOUNTER — Encounter: Payer: Medicare Other | Admitting: Physical Therapy

## 2022-08-28 ENCOUNTER — Encounter: Payer: Medicare Other | Admitting: Physical Therapy

## 2022-09-04 ENCOUNTER — Encounter: Payer: Medicare Other | Admitting: Physical Therapy

## 2022-09-05 ENCOUNTER — Encounter: Payer: Self-pay | Admitting: Physical Therapy

## 2022-09-05 ENCOUNTER — Ambulatory Visit: Payer: Medicare Other | Attending: Internal Medicine | Admitting: Physical Therapy

## 2022-09-05 DIAGNOSIS — M6281 Muscle weakness (generalized): Secondary | ICD-10-CM | POA: Insufficient documentation

## 2022-09-05 DIAGNOSIS — M25611 Stiffness of right shoulder, not elsewhere classified: Secondary | ICD-10-CM | POA: Insufficient documentation

## 2022-09-05 NOTE — Therapy (Signed)
OUTPATIENT PHYSICAL THERAPY TREATMENT/Progress Notes   Dates of reporting period  07/31/22   to   09/05/22    Patient Name: Whitney Mclean MRN: [id number] DOB:[birth year], 56 y.o., female Today's Date: 09/05/2022  PCP: Whitney Filbert MD REFERRING PROVIDER: Artis Delay, MD   PT End of Session - 09/05/22 0959     Visit Number 30    Number of Visits 30    Date for PT Re-Evaluation 10/09/22    Authorization Type BCBS    Authorization Time Period 01/29/22 - 04/23/22    Authorization - Visit Number 61    Authorization - Number of Visits 30    Progress Note Due on Visit 30    PT Start Time 1000    PT Stop Time 1040    PT Time Calculation (min) 40 min    Equipment Utilized During Treatment Gait belt    Activity Tolerance Patient tolerated treatment well;No increased pain    Behavior During Therapy WFL for tasks assessed/performed                      Past Medical History:  Diagnosis Date   Oral cancer ([place])    Scleroderma ([place])    Past Surgical History:  Procedure Laterality Date   ABDOMINAL HYSTERECTOMY     There are no problems to display for this patient.   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  PRECAUTIONS: none  SUBJECTIVE: Pt starting to turn a corner in feeling improved from illness, but she is noticing deconditioning in her shoulder.    PAIN:  Are you having pain? No   TODAY'S TREATMENT:  There.ex:  NuStep level 3 x5 minutes to promote protraction/retraction Seat 7 UE 11  Plank attempt 2x 8sec 10sec  High plank on wall alt diagonal scap taps RTB 2x 12  with good carry over of demo  Birddog 2x 12 (6 each side) with good carry over of technique  Good Morning 2x 12 with dowel with good carry over of demo  Manual:   To reduce pain and tissue tension, improve range of motion, neuromodulation, in order to promote improved ability to complete functional activities.   STM to R side of ant neck (hardened area) In sidelying for patient comfort Cervical PROM all directions   PATIENT EDUCATION: Education details: therex form/technique  Person educated: Patient Education method: Consulting civil engineer, Demonstration, and Verbal cues Education comprehension: verbalized understanding, returned demonstration, and verbal cues required   HOME EXERCISE PROGRAM: Access Code: [email or web address] - Standing Low Trap Setting with Resistance at [place]  - 1 x daily - 2 x weekly - 3 sets - 8-12 reps - Squat with Overhead Press  - 1 x daily - 2 x weekly - 3 sets - 8-12 reps - Bird Dog  - 1 x daily - 2 x weekly - 3 sets - 12-20 reps - Forearm Plank on Wall  - 1 x daily - 7 x weekly - 3 reps - 30sec hold   Clinical Impression: PT continued progression for increased overhead mobility strength, and cervical ext/rotation with success. Patient demonstrates good effort throughout session with no increased pain. Patient  is able to comply with all cuing for proper technique of therex with multimodal cuing. Pt continues to make slow, steady progress toward increased mobility, with good maintenance of mobility, better with increased PT sessions. PT will continue progression as able.    Short Term Goals: 1.  Pt will be independent with HEP in order to improve strength and mobility in order to improve function in ADLs.  Baseline: 07/31/22 New HEP updated; 09/05/22 completing 50% of dosing Goal status: ongoing  Long Term Goals: 1.  Pt will be able to demonstrate plank of 30sec to demonstrate increased general stability endurance for progress toward age-matched norm of 43mn Baseline:  07/31/22 4sec; 09/05/22 10sec Goal status: IN PROGRESS  2.  Patient will demonstrate full cervical rotation AROM in order to drive safely  Baseline: 04/24/22:  Rot R 45, Rot L 33.; 07/31/22 R/L Rotation 54/34  Goal status: IN PROGRESS  3.  Patient will demonstrate full R shoulder flex A/PROM in order to complete overhead ADLs  Baseline: 07/31/22: flexion 157 active/ 175 passive  Goal status: IN PROGRESS     CDurwin RegesDPT CDurwin Reges PT 09/05/2022, 10:41 AM

## 2022-09-11 ENCOUNTER — Encounter: Payer: Medicare Other | Admitting: Physical Therapy

## 2022-09-18 ENCOUNTER — Encounter: Payer: Medicare Other | Admitting: Physical Therapy

## 2022-09-25 ENCOUNTER — Encounter: Payer: Self-pay | Admitting: Physical Therapy

## 2022-09-25 ENCOUNTER — Ambulatory Visit: Payer: Medicare Other | Attending: Internal Medicine | Admitting: Physical Therapy

## 2022-09-25 DIAGNOSIS — M25611 Stiffness of right shoulder, not elsewhere classified: Secondary | ICD-10-CM

## 2022-09-25 DIAGNOSIS — M542 Cervicalgia: Secondary | ICD-10-CM | POA: Diagnosis present

## 2022-09-25 DIAGNOSIS — M6281 Muscle weakness (generalized): Secondary | ICD-10-CM | POA: Diagnosis present

## 2022-09-25 NOTE — Therapy (Signed)
OUTPATIENT PHYSICAL THERAPY TREATMENT/Progress Notes   Dates of reporting period  07/31/22   to   09/05/22    Patient Name: Whitney Mclean MRN: [id number] DOB:[birth year], 57 y.o., female Today's Date: 09/25/2022  PCP: Emily Filbert MD REFERRING PROVIDER: Artis Delay, MD   PT End of Session - 09/25/22 1131     Visit Number 1    Number of Visits 30    Authorization Type BCBS    Authorization Time Period 09/23/21 - 09/23/23 30visit    Authorization - Visit Number 1    Authorization - Number of Visits 30    Progress Note Due on Visit 10    PT Start Time 5885    PT Stop Time 1205    PT Time Calculation (min) 39 min    Activity Tolerance Patient tolerated treatment well;No increased pain    Behavior During Therapy WFL for tasks assessed/performed                       Past Medical History:  Diagnosis Date   Oral cancer ([place])    Scleroderma ([place])    Past Surgical History:  Procedure Laterality Date   ABDOMINAL HYSTERECTOMY     There are no problems to display for this patient.   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  PRECAUTIONS: none  SUBJECTIVE: Pt starting to turn a corner in feeling improved from illness, but she is noticing deconditioning in her shoulder.    PAIN:  Are you having pain? No   TODAY'S TREATMENT:  There.ex:  NuStep level 4 x5 minutes to promote protraction/retraction Seat 7 UE 11  Alt scaption in plank from knees position 2x 12 with max cuing needed for position  Dead bug with theraball 2x 12 with cuing for max shoulder flex  with good carry over  Good Morning x12 with dowel with good carry over of demo  Manual:   To reduce pain and tissue tension, improve range of motion, neuromodulation, in order to promote improved ability to complete functional activities.   STM to R side of ant neck (hardened area) In sidelying for patient comfort Cervical PROM all directions   PATIENT EDUCATION: Education details: therex form/technique  Person educated: Patient Education method: Consulting civil engineer, Demonstration, and Verbal cues Education comprehension: verbalized understanding, returned demonstration, and verbal cues required   HOME EXERCISE PROGRAM: Access Code: [email or web address] - Standing Low Trap Setting with Resistance at [place]  - 1 x daily - 2 x weekly - 3 sets - 8-12 reps - Squat with Overhead Press  - 1 x daily - 2 x weekly - 3 sets - 8-12 reps - Bird Dog  - 1 x daily - 2 x weekly - 3 sets - 12-20 reps - Forearm Plank on Wall  - 1 x daily - 7 x weekly - 3 reps - 30sec hold   Clinical Impression: PT continued progression for increased overhead mobility strength, and cervical ext/rotation with success. Patient demonstrates good effort throughout session with no increased pain. Patient is able to comply with all cuing for proper technique of therex with multimodal cuing. Pt continues to  make slow, steady progress toward increased mobility, with good maintenance of mobility, better with increased PT sessions. PT will continue progression as able.    Short Term Goals: 1.  Pt will be independent with HEP in order to improve strength and mobility in order to improve function in ADLs.  Baseline: 07/31/22 New HEP updated; 09/05/22 completing 50% of dosing Goal status: ongoing  Long Term Goals: 1.  Pt will be able to demonstrate plank of 30sec to demonstrate increased general stability endurance for progress toward age-matched norm of 42mn Baseline: 07/31/22 4sec; 09/05/22 10sec Goal status: IN PROGRESS  2.  Patient will  demonstrate full cervical rotation AROM in order to drive safely  Baseline: 04/24/22:  Rot R 45, Rot L 33.; 07/31/22 R/L Rotation 54/34  Goal status: IN PROGRESS  3.  Patient will demonstrate full R shoulder flex A/PROM in order to complete overhead ADLs  Baseline: 07/31/22: flexion 157 active/ 175 passive  Goal status: IN PROGRESS     CDurwin RegesDPT CDurwin Reges PT 09/25/2022, 12:03 PM

## 2022-10-15 ENCOUNTER — Ambulatory Visit: Payer: Medicare Other | Admitting: Physical Therapy

## 2022-10-15 ENCOUNTER — Encounter: Payer: Self-pay | Admitting: Physical Therapy

## 2022-10-15 DIAGNOSIS — M6281 Muscle weakness (generalized): Secondary | ICD-10-CM

## 2022-10-15 DIAGNOSIS — M542 Cervicalgia: Secondary | ICD-10-CM

## 2022-10-15 DIAGNOSIS — M25611 Stiffness of right shoulder, not elsewhere classified: Secondary | ICD-10-CM

## 2022-10-15 NOTE — Therapy (Signed)
OUTPATIENT PHYSICAL THERAPY TREATMENT/Progress Notes   Dates of reporting period  07/31/22   to   09/05/22    Patient Name: Whitney Mclean MRN: [id number] DOB:Mar 03, 1966, 57 y.o., female Today's Date: 10/15/2022  PCP: Emily Filbert MD REFERRING PROVIDER: Artis Delay, MD   PT End of Session - 10/15/22 1127     Visit Number 2    Number of Visits 30    Date for PT Re-Evaluation 12/31/22    Authorization Type BCBS    Authorization Time Period 09/23/21 - 09/23/23 30visit    Authorization - Visit Number 2    Authorization - Number of Visits 30    Progress Note Due on Visit 10    PT Start Time 1128    PT Stop Time 1208    PT Time Calculation (min) 40 min    Activity Tolerance Patient tolerated treatment well;No increased pain    Behavior During Therapy WFL for tasks assessed/performed                        Past Medical History:  Diagnosis Date   Oral cancer ([place])    Scleroderma ([place])    Past Surgical History:  Procedure Laterality Date   ABDOMINAL HYSTERECTOMY     There are no problems to display for this patient.   REFERRING DIAG: sclaraderma  THERAPY DIAG:  Muscle weakness (generalized) - Plan: PT plan of care cert/re-cert  Stiffness of right shoulder, not elsewhere classified  Cervicalgia  Rationale for Evaluation and Treatment Rehabilitation  PERTINENT HISTORY: Whitney Mclean is a 14yoF who comes to [place] due to ongoing global stiffness in neck and shoulders in the setting of new scleroderma s/p radiation and chemotherapy 2/2 oral cancer (2016) and removal of Rt lymph nodes.  PRECAUTIONS: none  SUBJECTIVE: Pt starting to turn a corner in feeling improved from illness, but she is noticing deconditioning in her shoulder.    PAIN:  Are you having pain? No   TODAY'S TREATMENT:  There.ex:  NuStep level 4 x5 minutes to promote protraction/retraction Seat 7 UE 11  Alt scaption in plank from knees position 2x 12 with max cuing needed for  position  Prone superman with cane aarom Ues 2x 12 with min cuing for full available cervical ext  STS with overhead press 2# DB x12   Manual:   To reduce pain and tissue tension, improve range of motion, neuromodulation, in order to promote improved ability to complete functional activities.   STM to R side of ant neck (hardened area) In sidelying for patient comfort Cervical PROM all directions   PATIENT EDUCATION: Education details: therex form/technique  Person educated: Patient Education method: Consulting civil engineer, Demonstration, and Verbal cues Education comprehension: verbalized understanding, returned demonstration, and verbal cues required   HOME EXERCISE PROGRAM: Access Code: [email or web address] - Standing Low Trap Setting with Resistance at [place]  - 1 x daily - 2 x weekly - 3 sets - 8-12 reps - Squat with Overhead Press  - 1 x daily - 2 x weekly - 3 sets - 8-12 reps - Bird Dog  - 1 x daily - 2 x weekly - 3 sets - 12-20 reps - Forearm Plank on Wall  - 1 x daily - 7 x weekly - 3 reps - 30sec hold   Clinical Impression: PT continued progression for increased overhead mobility strength, and cervical ext/rotation with success. Patient demonstrates good effort throughout session with no increased pain. Patient is able to comply with  all cuing for proper technique of therex with multimodal cuing. Pt continues to make slow, steady progress toward increased mobility, with good maintenance of mobility, better with increased PT sessions. PT will continue progression as able.    Short Term Goals: 1.  Pt will be independent with HEP in order to improve strength and mobility in order to improve function in ADLs.  Baseline: 07/31/22 New HEP updated; 09/05/22 completing 50% of dosing Goal status: ongoing  Long Term Goals: 1.  Pt will be able to demonstrate plank of 30sec to demonstrate increased general stability endurance for progress toward age-matched norm of 22mn Baseline: 07/31/22 4sec; 09/05/22  10sec Goal status: IN PROGRESS  2.  Patient will demonstrate full cervical rotation AROM in order to drive safely  Baseline: 04/24/22:  Rot R 45, Rot L 33.; 07/31/22 R/L Rotation 54/34  Goal status: IN PROGRESS  3.  Patient will demonstrate full R shoulder flex A/PROM in order to complete overhead ADLs  Baseline: 07/31/22: flexion 157 active/ 175 passive  Goal status: IN PROGRESS     CDurwin RegesDPT CDurwin Reges PT 10/15/2022, 11:59 AM

## 2022-10-30 ENCOUNTER — Encounter: Payer: Self-pay | Admitting: Physical Therapy

## 2022-10-30 ENCOUNTER — Ambulatory Visit: Payer: Medicare Other | Attending: Internal Medicine | Admitting: Physical Therapy

## 2022-10-30 DIAGNOSIS — M6281 Muscle weakness (generalized): Secondary | ICD-10-CM | POA: Insufficient documentation

## 2022-10-30 DIAGNOSIS — M25611 Stiffness of right shoulder, not elsewhere classified: Secondary | ICD-10-CM | POA: Diagnosis present

## 2022-10-30 DIAGNOSIS — M542 Cervicalgia: Secondary | ICD-10-CM | POA: Diagnosis present

## 2022-10-30 NOTE — Therapy (Signed)
OUTPATIENT PHYSICAL THERAPY TREATMENT/Progress Notes   Dates of reporting period  07/31/22   to   09/05/22    Patient Name: Whitney Mclean MRN: [id number] DOB:[birth year], 57 y.o., female Today's Date: 10/30/2022  PCP: Emily Filbert MD REFERRING PROVIDER: Artis Delay, MD   PT End of Session - 10/30/22 1146     Visit Number 3    Number of Visits 30    Date for PT Re-Evaluation 12/31/22    Authorization Type BCBS    Authorization Time Period 09/23/21 - 09/23/23 30visit    Authorization - Visit Number 3    Authorization - Number of Visits 30    Progress Note Due on Visit 10    PT Start Time 1130    PT Stop Time 1209    PT Time Calculation (min) 39 min    Equipment Utilized During Treatment Gait belt    Activity Tolerance Patient tolerated treatment well;No increased pain    Behavior During Therapy WFL for tasks assessed/performed                         Past Medical History:  Diagnosis Date   Oral cancer ([place])    Scleroderma ([place])    Past Surgical History:  Procedure Laterality Date   ABDOMINAL HYSTERECTOMY     There are no problems to display for this patient.   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  PRECAUTIONS: none  SUBJECTIVE: Pt starting to turn a corner in feeling improved from illness, but she is noticing deconditioning in her shoulder.    PAIN:  Are you having pain? No   TODAY'S TREATMENT:  There.ex:  NuStep level 4 x5 minutes to promote protraction/retraction Seat 7 UE 11  Alt scaption in plank from knees position 2x 12 with max cuing needed for position  Good mornings with overhead 2# AW on PVC pipe  2x 10  STS with overhead press 2# AW on PVC pipe 2x 10/12 with lower to CTJ; cuing for "eyes up" on ball   Manual:   To reduce pain and tissue tension, improve range of motion, neuromodulation, in order to promote improved ability to complete functional activities.   STM to R side of ant neck (hardened area) In sidelying for patient comfort Cervical PROM all directions   PATIENT EDUCATION: Education details: therex form/technique  Person educated: Patient Education method: Consulting civil engineer, Demonstration, and Verbal cues Education comprehension: verbalized understanding, returned demonstration, and verbal cues required   HOME EXERCISE PROGRAM: Access Code: [email or web address] - Standing Low Trap Setting with Resistance at [place]  - 1 x daily - 2 x weekly - 3 sets - 8-12 reps - Squat with Overhead Press  - 1 x daily - 2 x weekly - 3 sets - 8-12 reps - Bird Dog  - 1 x daily - 2 x weekly - 3 sets - 12-20 reps - Forearm Plank on Wall  - 1 x daily - 7 x weekly - 3 reps - 30sec hold   Clinical Impression: PT continued progression for increased overhead mobility strength, and cervical ext/rotation with success. Patient demonstrates good effort throughout session with no increased pain. Patient is able to  comply with all cuing for proper technique of therex with multimodal cuing. Pt continues to make slow, steady progress toward increased mobility, with good maintenance of mobility, better with increased PT sessions. PT will continue progression as able.    Short Term Goals: 1.  Pt will be independent with HEP in order to improve strength and mobility in order to improve function in ADLs.  Baseline: 07/31/22 New HEP updated; 09/05/22 completing 50% of dosing Goal status: ongoing  Long Term Goals: 1.  Pt will be able to demonstrate plank of 30sec to demonstrate increased general stability endurance for progress toward age-matched norm of 11mn Baseline: 07/31/22 4sec; 09/05/22 10sec Goal status: IN  PROGRESS  2.  Patient will demonstrate full cervical rotation AROM in order to drive safely  Baseline: 04/24/22:  Rot R 45, Rot L 33.; 07/31/22 R/L Rotation 54/34  Goal status: IN PROGRESS  3.  Patient will demonstrate full R shoulder flex A/PROM in order to complete overhead ADLs  Baseline: 07/31/22: flexion 157 active/ 175 passive  Goal status: IN PROGRESS     CDurwin RegesDPT CDurwin Reges PT 10/30/2022, 12:34 PM

## 2022-11-05 ENCOUNTER — Other Ambulatory Visit: Payer: Self-pay | Admitting: Internal Medicine

## 2022-11-05 DIAGNOSIS — Z1231 Encounter for screening mammogram for malignant neoplasm of breast: Secondary | ICD-10-CM

## 2022-11-12 ENCOUNTER — Ambulatory Visit
Admission: RE | Admit: 2022-11-12 | Discharge: 2022-11-12 | Disposition: A | Discharge status: Home / Self Care | Payer: Medicare Other | Source: Ambulatory Visit | Attending: Internal Medicine | Admitting: Internal Medicine

## 2022-11-12 DIAGNOSIS — Z1231 Encounter for screening mammogram for malignant neoplasm of breast: Secondary | ICD-10-CM | POA: Diagnosis not present

## 2022-11-12 HISTORY — DX: Personal history of irradiation: Z92.3

## 2022-11-12 HISTORY — DX: Personal history of antineoplastic chemotherapy: Z92.21

## 2022-11-13 ENCOUNTER — Ambulatory Visit: Payer: Medicare Other | Admitting: Physical Therapy

## 2022-11-13 DIAGNOSIS — M542 Cervicalgia: Secondary | ICD-10-CM

## 2022-11-13 DIAGNOSIS — M6281 Muscle weakness (generalized): Secondary | ICD-10-CM | POA: Diagnosis not present

## 2022-11-13 DIAGNOSIS — M25611 Stiffness of right shoulder, not elsewhere classified: Secondary | ICD-10-CM

## 2022-11-13 NOTE — Therapy (Signed)
OUTPATIENT PHYSICAL THERAPY TREATMENT/Progress Notes   Dates of reporting period  07/31/22   to   09/05/22    Patient Name: Whitney Mclean MRN: [id number] DOB:[birth year], 57 y.o., female Today's Date: 11/14/2022  PCP: Emily Filbert MD REFERRING PROVIDER: Artis Delay, MD   PT End of Session - 11/14/22 1550     Visit Number 4    Number of Visits 30    Date for PT Re-Evaluation 12/31/22    Authorization Type BCBS    Authorization Time Period 09/23/21 - 09/23/23 30visit    Authorization - Visit Number 4    Authorization - Number of Visits 30    Progress Note Due on Visit 10    PT Start Time 1130    PT Stop Time 1210    PT Time Calculation (min) 40 min    Activity Tolerance Patient tolerated treatment well;No increased pain    Behavior During Therapy WFL for tasks assessed/performed                          Past Medical History:  Diagnosis Date   Oral cancer ([place])    Personal history of chemotherapy    Personal history of radiation therapy    Scleroderma ([place])    Past Surgical History:  Procedure Laterality Date   ABDOMINAL HYSTERECTOMY     There are no problems to display for this patient.   REFERRING DIAG: sclaraderma  THERAPY DIAG:  Muscle weakness (generalized)  Stiffness of right shoulder, not elsewhere classified  Cervicalgia  Rationale for Evaluation and Treatment Rehabilitation  PERTINENT HISTORY: Whitney Mclean is a 45yoF who comes to 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  PRECAUTIONS: none  SUBJECTIVE: Pt starting to turn a corner in feeling improved from illness, but she is noticing deconditioning in her shoulder.    PAIN:  Are you having pain? No   TODAY'S TREATMENT:  There.ex:  NuStep level 4 x5 minutes to promote protraction/retraction Seat 7 UE 11  Alt scaption in plank from  knees position 2x 12 with max cuing needed for position not pushing into qped  Y on wall 1# x12;   STS with overhead press 2# AW on PVC pipe 2x 10/12 with lower to CTJ; cuing for "eyes up" on ball   Manual:   To reduce pain and tissue tension, improve range of motion, neuromodulation, in order to promote improved ability to complete functional activities.   STM to R side of ant neck (hardened area) In sidelying for patient comfort Cervical PROM all directions   PATIENT EDUCATION: Education details: therex form/technique  Person educated: Patient Education method: Consulting civil engineer, Demonstration, and Verbal cues Education comprehension: verbalized understanding, returned demonstration, and verbal cues required   HOME EXERCISE PROGRAM: Access Code: [id number] - Standing Low Trap Setting with Resistance at [place]  - 1 x daily - 2 x weekly - 3 sets - 8-12 reps - Squat with Overhead Press  - 1 x daily - 2 x weekly - 3 sets - 8-12 reps - Bird Dog  - 1 x daily - 2 x weekly - 3 sets - 12-20 reps - Forearm Plank on Wall  - 1 x daily - 7 x weekly - 3 reps - 30sec hold   Clinical Impression: PT continued progression for increased overhead mobility strength, and cervical ext/rotation  with success. Patient demonstrates good effort throughout session with no increased pain. Patient is able to comply with all cuing for proper technique of therex with multimodal cuing. Pt continues to make slow, steady progress toward increased mobility, with good maintenance of mobility, better with increased PT sessions. PT will continue progression as able.    Short Term Goals: 1.  Pt will be independent with HEP in order to improve strength and mobility in order to improve function in ADLs.  Baseline: 07/31/22 New HEP updated; 09/05/22 completing 50% of dosing Goal status: ongoing  Long Term Goals: 1.  Pt will be able to demonstrate plank of 30sec to demonstrate increased general stability endurance for progress  toward age-matched norm of 53mn Baseline: 07/31/22 4sec; 09/05/22 10sec Goal status: IN PROGRESS  2.  Patient will demonstrate full cervical rotation AROM in order to drive safely  Baseline: 04/24/22:  Rot R 45, Rot L 33.; 07/31/22 R/L Rotation 54/34  Goal status: IN PROGRESS  3.  Patient will demonstrate full R shoulder flex A/PROM in order to complete overhead ADLs  Baseline: 07/31/22: flexion 157 active/ 175 passive  Goal status: IN PROGRESS     CDurwin RegesDPT CDurwin Reges PT 11/14/2022, 3:51 PM

## 2022-11-14 ENCOUNTER — Encounter: Payer: Self-pay | Admitting: Physical Therapy

## 2022-11-19 ENCOUNTER — Encounter: Payer: Self-pay | Admitting: Physical Therapy

## 2022-11-19 ENCOUNTER — Ambulatory Visit: Payer: Medicare Other | Admitting: Physical Therapy

## 2022-11-19 DIAGNOSIS — M25611 Stiffness of right shoulder, not elsewhere classified: Secondary | ICD-10-CM

## 2022-11-19 DIAGNOSIS — M6281 Muscle weakness (generalized): Secondary | ICD-10-CM | POA: Diagnosis not present

## 2022-11-19 DIAGNOSIS — M542 Cervicalgia: Secondary | ICD-10-CM

## 2022-11-19 NOTE — Therapy (Signed)
OUTPATIENT PHYSICAL THERAPY TREATMENT/Progress Notes   Dates of reporting period  07/31/22   to   09/05/22    Patient Name: Whitney Mclean MRN: [id number] DOB:01-Feb-1966, 57 y.o., female Today's Date: 11/19/2022  PCP: Emily Filbert MD REFERRING PROVIDER: Artis Delay, MD   PT End of Session - 11/19/22 1307     Visit Number 5    Number of Visits 30    Date for PT Re-Evaluation 12/31/22    Authorization Type BCBS    Authorization Time Period 09/23/21 - 09/23/23 30visit    Authorization - Visit Number 5    Authorization - Number of Visits 30    PT Start Time [id number]    PT Stop Time 1344    PT Time Calculation (min) 40 min    Activity Tolerance Patient tolerated treatment well;No increased pain    Behavior During Therapy WFL for tasks assessed/performed               Past Medical History:  Diagnosis Date   Oral cancer ([place])    Personal history of chemotherapy    Personal history of radiation therapy    Scleroderma ([place])    Past Surgical History:  Procedure Laterality Date   ABDOMINAL HYSTERECTOMY     There are no problems to display for this patient.   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  PRECAUTIONS: none  SUBJECTIVE: Pt starting to turn a corner in feeling improved from illness, but she is noticing deconditioning in her shoulder.    PAIN:  Are you having pain? No   TODAY'S TREATMENT:  There.ex:  NuStep level 4 x5 minutes to promote protraction/retraction Seat 7 UE 11  Alt scap taps on wall RTB in wall push up 2x 12 with cuing for neutral cervical ROM  with good carry over  Plank from knees alt Y 2x 12 with cuing for plank position > qped with good carry over  Squat with lateral shift and overhead punch at stand 2# DB x12; 3# x12 with more difficulty with maintaining shoulder overhead flex at end of 3# reps  Manual:   To reduce pain and tissue tension, improve range of motion, neuromodulation, in order to promote improved ability to complete functional activities.   STM to R side of ant neck (hardened area) In sidelying for patient comfort Cervical PROM all directions   PATIENT EDUCATION: Education details: therex form/technique  Person educated: Patient Education method: Consulting civil engineer, Demonstration, and Verbal cues Education comprehension: verbalized understanding, returned demonstration, and verbal cues required   HOME EXERCISE PROGRAM: Access Code: [id number] - Standing Low Trap Setting with Resistance at [place]  - 1 x daily - 2 x weekly - 3 sets - 8-12 reps - Squat with Overhead Press  - 1 x daily - 2 x weekly - 3 sets - 8-12 reps - Bird Dog  - 1 x daily - 2 x weekly - 3 sets - 12-20 reps - Forearm Plank on Wall  - 1 x daily - 7 x weekly - 3 reps - 30sec hold   Clinical Impression: PT continued progression for increased overhead mobility strength, and cervical ext/rotation  with success. Patient demonstrates good effort throughout session with no increased pain. Patient is able to comply with all cuing for proper technique of therex with multimodal cuing. Pt with increased ability to obtain neutral [place]-spine position against gravity this session > last. Pt continues to make slow, steady progress toward increased mobility, with good maintenance of mobility, better with increased PT sessions. PT will continue progression as able.    Short Term Goals: 1.  Pt will be independent with HEP in order to improve strength and mobility in order to improve function in ADLs.  Baseline: 07/31/22 New HEP updated; 09/05/22 completing 50% of  dosing Goal status: ongoing  Long Term Goals: 1.  Pt will be able to demonstrate plank of 30sec to demonstrate increased general stability endurance for progress toward age-matched norm of 73mn Baseline: 07/31/22 4sec; 09/05/22 10sec Goal status: IN PROGRESS  2.  Patient will demonstrate full cervical rotation AROM in order to drive safely  Baseline: 04/24/22:  Rot R 45, Rot L 33.; 07/31/22 R/L Rotation 54/34  Goal status: IN PROGRESS  3.  Patient will demonstrate full R shoulder flex A/PROM in order to complete overhead ADLs  Baseline: 07/31/22: flexion 157 active/ 175 passive  Goal status: IN PROGRESS     CDurwin RegesDPT CDurwin Reges PT 11/19/2022, 1:52 PM

## 2022-11-27 ENCOUNTER — Ambulatory Visit: Payer: Medicare Other | Attending: Internal Medicine | Admitting: Physical Therapy

## 2022-11-27 ENCOUNTER — Encounter: Payer: Self-pay | Admitting: Physical Therapy

## 2022-11-27 DIAGNOSIS — M542 Cervicalgia: Secondary | ICD-10-CM | POA: Diagnosis present

## 2022-11-27 DIAGNOSIS — M25611 Stiffness of right shoulder, not elsewhere classified: Secondary | ICD-10-CM | POA: Insufficient documentation

## 2022-11-27 DIAGNOSIS — M6281 Muscle weakness (generalized): Secondary | ICD-10-CM | POA: Diagnosis present

## 2022-11-27 NOTE — Therapy (Signed)
OUTPATIENT PHYSICAL THERAPY TREATMENT    Patient Name: Whitney Mclean MRN: [id number] DOB:08-Oct-1965, 57 y.o., female Today's Date: 11/27/2022  PCP: Emily Filbert MD REFERRING PROVIDER: Artis Delay, MD   PT End of Session - 11/27/22 1132     Visit Number 6    Number of Visits 30    Date for PT Re-Evaluation 12/31/22    Authorization Type BCBS    Authorization Time Period 09/23/21 - 09/23/23 30visit    Authorization - Visit Number 6    Authorization - Number of Visits 30    Progress Note Due on Visit 10    PT Start Time 1132    PT Stop Time 1210    PT Time Calculation (min) 38 min    Equipment Utilized During Treatment Gait belt    Activity Tolerance Patient tolerated treatment well;No increased pain    Behavior During Therapy WFL for tasks assessed/performed                Past Medical History:  Diagnosis Date   Oral cancer ([place])    Personal history of chemotherapy    Personal history of radiation therapy    Scleroderma ([place])    Past Surgical History:  Procedure Laterality Date   ABDOMINAL HYSTERECTOMY     There are no problems to display for this patient.   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  PRECAUTIONS: none  SUBJECTIVE: Pt starting to turn a corner in feeling improved from illness, but she is noticing deconditioning in her shoulder.    PAIN:  Are you having pain? No   TODAY'S TREATMENT:  There.ex:  NuStep level 4 x5 minutes to promote protraction/retraction Seat 7 UE 11  Alt scap taps on wall GTB in wall push up 2x 12 with cuing for  neutral cervical ROM with good carry over  Y on theraball x12; with 1# DB x12 with good carry over of initial cuing for technique/ core activation  Squat with lateral shift and overhead punch at stand 3# 2x 12 with more difficulty with maintaining shoulder overhead flex at end of 3# reps  Manual:   To reduce pain and tissue tension, improve range of motion, neuromodulation, in order to promote improved ability to complete functional activities.   STM to R side of ant neck (hardened area) In sidelying for patient comfort Cervical PROM all directions   PATIENT EDUCATION: Education details: therex form/technique  Person educated: Patient Education method: Consulting civil engineer, Demonstration, and Verbal cues Education comprehension: verbalized understanding, returned demonstration, and verbal cues required   HOME EXERCISE PROGRAM: Access Code: [id number] - Standing Low Trap Setting with Resistance at [place]  - 1 x daily - 2 x weekly - 3 sets - 8-12 reps - Squat with Overhead Press  - 1 x daily - 2 x weekly - 3 sets - 8-12 reps - Bird Dog  - 1 x daily - 2 x weekly - 3 sets - 12-20 reps - Forearm Plank on Wall  - 1 x daily - 7 x weekly - 3 reps - 30sec hold   Clinical Impression: PT continued progression for increased overhead mobility strength,  and cervical ext/rotation with success. Patient demonstrates good effort throughout session with no increased pain. Patient is able to comply with all cuing for proper technique of therex with multimodal cuing. Pt with increased ability to obtain neutral [place]-spine position against gravity this session > last. Pt continues to make slow, steady progress toward increased mobility, with good maintenance of mobility, better with increased PT sessions. PT will continue progression as able.    Short Term Goals: 1.  Pt will be independent with HEP in order to improve strength and mobility in order to improve function in ADLs.  Baseline: 07/31/22 New HEP updated;  09/05/22 completing 50% of dosing Goal status: ongoing  Long Term Goals: 1.  Pt will be able to demonstrate plank of 30sec to demonstrate increased general stability endurance for progress toward age-matched norm of 58mn Baseline: 07/31/22 4sec; 09/05/22 10sec Goal status: IN PROGRESS  2.  Patient will demonstrate full cervical rotation AROM in order to drive safely  Baseline: 04/24/22:  Rot R 45, Rot L 33.; 07/31/22 R/L Rotation 54/34  Goal status: IN PROGRESS  3.  Patient will demonstrate full R shoulder flex A/PROM in order to complete overhead ADLs  Baseline: 07/31/22: flexion 157 active/ 175 passive  Goal status: IN PROGRESS     CDurwin RegesDPT CDurwin Reges PT 11/27/2022, 3:13 PM

## 2022-12-11 ENCOUNTER — Ambulatory Visit: Payer: Medicare Other | Admitting: Physical Therapy

## 2022-12-11 ENCOUNTER — Encounter: Payer: Self-pay | Admitting: Physical Therapy

## 2022-12-11 DIAGNOSIS — M6281 Muscle weakness (generalized): Secondary | ICD-10-CM

## 2022-12-11 DIAGNOSIS — M25611 Stiffness of right shoulder, not elsewhere classified: Secondary | ICD-10-CM

## 2022-12-11 DIAGNOSIS — M542 Cervicalgia: Secondary | ICD-10-CM

## 2022-12-11 NOTE — Therapy (Signed)
OUTPATIENT PHYSICAL THERAPY TREATMENT    Patient Name: Whitney Mclean MRN: [id number] DOB:10/25/1965, 57 y.o., female Today's Date: 12/11/2022  PCP: Emily Filbert MD REFERRING PROVIDER: Artis Delay, MD   PT End of Session - 12/11/22 1131     Visit Number 7    Number of Visits 30    Date for PT Re-Evaluation 12/31/22    Authorization Type BCBS    Authorization Time Period 09/23/21 - 09/23/23 30visit    Authorization - Visit Number 7    Authorization - Number of Visits 30    Progress Note Due on Visit 10    PT Start Time 1130    PT Stop Time 1208    PT Time Calculation (min) 38 min    Equipment Utilized During Treatment Gait belt    Activity Tolerance Patient tolerated treatment well;No increased pain    Behavior During Therapy WFL for tasks assessed/performed                 Past Medical History:  Diagnosis Date   Oral cancer ([place])    Personal history of chemotherapy    Personal history of radiation therapy    Scleroderma ([place])    Past Surgical History:  Procedure Laterality Date   ABDOMINAL HYSTERECTOMY     There are no problems to display for this patient.   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  PRECAUTIONS: none  SUBJECTIVE: Pt starting to turn a corner in feeling improved from illness, but she is noticing deconditioning in her shoulder.    PAIN:  Are you having pain? No   TODAY'S TREATMENT:  There.ex:  NuStep level 4 x5 minutes to promote protraction/retraction Seat 7 UE 11  Alt scap taps on wall GTB in wall push up 2x 12 with cuing for  neutral cervical ROM with good carry over  Y on theraball with 1# DB 2x12 with good carry over of initial cuing for technique/ core activation  Overhead ball slam 15# 2x 5 with cuing for max avail ROM with good carry over  Manual:   To reduce pain and tissue tension, improve range of motion, neuromodulation, in order to promote improved ability to complete functional activities.   STM to R side of ant neck (hardened area) In sidelying for patient comfort Cervical PROM all directions   PATIENT EDUCATION: Education details: therex form/technique  Person educated: Patient Education method: Consulting civil engineer, Demonstration, and Verbal cues Education comprehension: verbalized understanding, returned demonstration, and verbal cues required   HOME EXERCISE PROGRAM: Access Code: [email or web address] - Standing Low Trap Setting with Resistance at [place]  - 1 x daily - 2 x weekly - 3 sets - 8-12 reps - Squat with Overhead Press  - 1 x daily - 2 x weekly - 3 sets - 8-12 reps - Bird Dog  - 1 x daily - 2 x weekly - 3 sets - 12-20 reps - Forearm Plank on Wall  - 1 x daily - 7 x weekly - 3 reps - 30sec hold   Clinical Impression: PT continued progression for increased overhead mobility strength, and cervical ext/rotation with success. Patient demonstrates good effort  throughout session with no increased pain. Patient is able to comply with all cuing for proper technique of therex with multimodal cuing.Pt continues to make slow, steady progress toward increased mobility, with good maintenance of mobility, better with increased PT sessions. PT will continue progression as able.    Short Term Goals: 1.  Pt will be independent with HEP in order to improve strength and mobility in order to improve function in ADLs.  Baseline: 07/31/22 New HEP updated; 09/05/22 completing 50% of dosing Goal status: ongoing  Long Term Goals: 1.  Pt will be able to demonstrate plank of 30sec to demonstrate increased general stability  endurance for progress toward age-matched norm of 50min Baseline: 07/31/22 4sec; 09/05/22 10sec Goal status: IN PROGRESS  2.  Patient will demonstrate full cervical rotation AROM in order to drive safely  Baseline: 04/24/22:  Rot R 45, Rot L 33.; 07/31/22 R/L Rotation 54/34  Goal status: IN PROGRESS  3.  Patient will demonstrate full R shoulder flex A/PROM in order to complete overhead ADLs  Baseline: 07/31/22: flexion 157 active/ 175 passive  Goal status: IN PROGRESS     Durwin Reges DPT Durwin Reges, PT 12/11/2022, 12:08 PM

## 2022-12-25 ENCOUNTER — Encounter: Payer: Self-pay | Admitting: Physical Therapy

## 2022-12-25 ENCOUNTER — Ambulatory Visit: Payer: Medicare Other | Attending: Internal Medicine | Admitting: Physical Therapy

## 2022-12-25 DIAGNOSIS — M25611 Stiffness of right shoulder, not elsewhere classified: Secondary | ICD-10-CM | POA: Diagnosis present

## 2022-12-25 DIAGNOSIS — M6281 Muscle weakness (generalized): Secondary | ICD-10-CM

## 2022-12-25 NOTE — Therapy (Signed)
OUTPATIENT PHYSICAL THERAPY TREATMENT    Patient Name: Whitney Mclean MRN: [id number] DOB:1965-12-26, 57 y.o., female Today's Date: 12/25/2022  PCP: Emily Filbert MD REFERRING PROVIDER: Artis Delay, MD   PT End of Session - 12/25/22 1123     Visit Number 8    Number of Visits 30    Date for PT Re-Evaluation 12/31/22    Authorization Type BCBS    Authorization Time Period 09/23/21 - 09/23/23 30visit    Authorization - Visit Number 8    Authorization - Number of Visits 30    Progress Note Due on Visit 10    PT Start Time [id number]    PT Stop Time 1204    PT Time Calculation (min) 38 min    Equipment Utilized During Treatment Gait belt    Activity Tolerance Patient tolerated treatment well;No increased pain                  Past Medical History:  Diagnosis Date   Oral cancer    Personal history of chemotherapy    Personal history of radiation therapy    Scleroderma    Past Surgical History:  Procedure Laterality Date   ABDOMINAL HYSTERECTOMY     There are no problems to display for this patient.   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  PRECAUTIONS: none  SUBJECTIVE: Pt starting to turn a corner in feeling improved from illness, but she is noticing deconditioning in her shoulder.    PAIN:  Are you having pain? No   TODAY'S TREATMENT:  There.ex:  NuStep level 4 x5 minutes to promote protraction/retraction Seat 7 UE 11  Y on theraball with 1# DB 2x12 with good carry over of initial cuing for technique/ core activation   Goodmorning with PVC overhead 2x12 with min cuing for max available ext  Over the shoulder ball throw  (pick up from the floor 15# 2x 10 (5 each direction with cuing for max avail ROM with good carry over  Manual:   To reduce pain and tissue tension, improve range of motion, neuromodulation, in order to promote improved ability to complete functional activities.   STM to R side of ant neck (hardened area) In sidelying for patient comfort Cervical PROM all directions   PATIENT EDUCATION: Education details: therex form/technique  Person educated: Patient Education method: Consulting civil engineer, Demonstration, and Verbal cues Education comprehension: verbalized understanding, returned demonstration, and verbal cues required   HOME EXERCISE PROGRAM: Access Code: [email or web address] - Standing Low Trap Setting with Resistance at [place]  - 1 x daily - 2 x weekly - 3 sets - 8-12 reps - Squat with Overhead Press  - 1 x daily - 2 x weekly - 3 sets - 8-12 reps - Bird Dog  - 1 x daily - 2 x weekly - 3 sets - 12-20 reps - Forearm Plank on Wall  - 1 x daily - 7 x weekly - 3 reps - 30sec hold   Clinical Impression: PT continued progression for increased overhead mobility strength, and cervical ext/rotation with success. Patient demonstrates good effort throughout session with no increased pain. Patient is able to comply with all cuing for proper technique of therex with  multimodal cuing.Pt continues to make slow, steady progress toward increased mobility, with good maintenance of mobility, better with increased PT sessions. PT will continue progression as able.    Short Term Goals: 1.  Pt will be independent with HEP in order to improve strength and mobility in order to improve function in ADLs.  Baseline: 07/31/22 New HEP updated; 09/05/22 completing 50% of dosing Goal status: ongoing  Long Term Goals: 1.  Pt will be able to demonstrate plank of 30sec to demonstrate increased general stability endurance for progress toward age-matched norm of 40min Baseline: 07/31/22 4sec; 09/05/22 10sec Goal status: IN PROGRESS  2.   Patient will demonstrate full cervical rotation AROM in order to drive safely  Baseline: 04/24/22:  Rot R 45, Rot L 33.; 07/31/22 R/L Rotation 54/34  Goal status: IN PROGRESS  3.  Patient will demonstrate full R shoulder flex A/PROM in order to complete overhead ADLs  Baseline: 07/31/22: flexion 157 active/ 175 passive  Goal status: IN PROGRESS     Durwin Reges DPT Durwin Reges, PT 12/25/2022, 12:57 PM

## 2023-01-08 ENCOUNTER — Ambulatory Visit: Payer: Medicare Other

## 2023-01-08 DIAGNOSIS — M6281 Muscle weakness (generalized): Secondary | ICD-10-CM

## 2023-01-08 DIAGNOSIS — M25611 Stiffness of right shoulder, not elsewhere classified: Secondary | ICD-10-CM

## 2023-01-08 NOTE — Therapy (Signed)
OUTPATIENT PHYSICAL THERAPY TREATMENT    Patient Name: Whitney Mclean MRN: [id number] DOB:1966/08/15, 57 y.o., female Today's Date: 01/08/2023  PCP: Bethann Punches MD REFERRING PROVIDER: Durenda Age, MD   PT End of Session - 01/08/23 1127     Visit Number 9    Number of Visits 30    Date for PT Re-Evaluation 12/31/22    Authorization Type BCBS    Authorization Time Period 09/23/21 - 09/23/23 30visit    Authorization - Visit Number 9    Authorization - Number of Visits 30    Progress Note Due on Visit 10    PT Start Time 1130    PT Stop Time 1215    PT Time Calculation (min) 45 min    Equipment Utilized During Treatment Gait belt    Activity Tolerance Patient tolerated treatment well;No increased pain              Past Medical History:  Diagnosis Date   Oral cancer    Personal history of chemotherapy    Personal history of radiation therapy    Scleroderma    Past Surgical History:  Procedure Laterality Date   ABDOMINAL HYSTERECTOMY     There are no problems to display for this patient.   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   PRECAUTIONS: none   SUBJECTIVE:   Pt reports she is doing well.  She notes she has been staying active at home doing chores and going up and down into the basement.  PAIN:  Are you having pain? No   TODAY'S TREATMENT:  TherEx   NuStep level 4 x5 minutes to promote protraction/retraction Seat 7 UE 11  Supine chest press with PVC and 4# AW applied, 2x10  Supine shoulder flexion with PVC and 4# AW applied, 2x10  Rolling red  physioball up wall and stepping into ball to increase shoulder flexion, 2x10  Wall push-ups, 2x10   Manual:   To reduce pain and tissue tension, improve range of motion, neuromodulation, in order to promote improved ability to complete functional activities.   Supine STM to cervical region to increase extensibility of the paraspinals Supine suboccipital release technique to decrease cervicalgia Supine manual traction performed in order to increase joint space in cervical region for pain relief Supine UT/Levator stretch, 30 sec bouts to increase tissue extensibility of the cervical region    PATIENT EDUCATION: Education details: therex form/technique  Person educated: Patient Education method: Programmer, multimedia, Facilities manager, and Verbal cues Education comprehension: verbalized understanding, returned demonstration, and verbal cues required   HOME EXERCISE PROGRAM: Access Code: [email or web address] - Standing Low Trap Setting with Resistance at Wall  - 1 x daily - 2 x weekly - 3 sets - 8-12 reps - Squat with Overhead Press  - 1 x daily - 2 x weekly - 3 sets - 8-12 reps - Bird Dog  - 1 x daily - 2 x weekly - 3 sets - 12-20 reps - Forearm Plank on Wall  - 1 x daily - 7 x weekly - 3 reps - 30sec hold   Clinical Impression:   Pt responded well to the  exercises and noted them to be challenging and beneficial.  Pt is able to achieve greater ROM and perform well with all tasks.  Pt also responded well to manual therapy approaches, noting an increase in overall cervical ROM upon leaving the clinic.   Pt will continue to benefit from skilled therapy to address remaining deficits in order to improve overall QoL and return to PLOF.       Short Term Goals: 1.  Pt will be independent with HEP in order to improve strength and mobility in order to improve function in ADLs.  Baseline: 07/31/22 New HEP updated; 09/05/22 completing 50% of dosing Goal status: ongoing  Long Term Goals: 1.  Pt will be able to  demonstrate plank of 30sec to demonstrate increased general stability endurance for progress toward age-matched norm of Baseline: 07/31/22 4sec 09/05/22 10sec 01/08/23: Goal status: IN PROGRESS  2.  Patient will demonstrate full cervical rotation AROM in order to drive safely  Baseline: 09/28/08:  Rot R 45, Rot L 33. 07/31/22 R/L Rotation 54/34  01/08/23: Goal status: IN PROGRESS  3.  Patient will demonstrate full R shoulder flex A/PROM in order to complete overhead ADLs  Baseline: 07/31/22: flexion 157 active/ 175 passive  01/08/23: flexion: 142 active/ Goal status: IN PROGRESS    Nolon Bussing, PT, DPT Physical Therapist - [facility]  01/08/23, 2:01 PM

## 2023-01-22 ENCOUNTER — Ambulatory Visit: Payer: Medicare Other | Attending: Internal Medicine

## 2023-01-22 DIAGNOSIS — M6281 Muscle weakness (generalized): Secondary | ICD-10-CM | POA: Diagnosis present

## 2023-01-22 DIAGNOSIS — M542 Cervicalgia: Secondary | ICD-10-CM | POA: Diagnosis present

## 2023-01-22 DIAGNOSIS — M25611 Stiffness of right shoulder, not elsewhere classified: Secondary | ICD-10-CM | POA: Insufficient documentation

## 2023-01-22 DIAGNOSIS — M79641 Pain in right hand: Secondary | ICD-10-CM | POA: Insufficient documentation

## 2023-01-22 NOTE — Therapy (Signed)
OUTPATIENT PHYSICAL THERAPY TREATMENT/RE-CERTIFICATION/PHYSICAL THERAPY PROGRESS NOTE   Dates of reporting period  09/25/22   to   01/22/23     Patient Name: Whitney Mclean MRN: [id number] DOB:[birth year], 57 y.o., female Today's Date: 01/22/2023  PCP: Bethann Punches MD REFERRING PROVIDER: Durenda Age, MD   PT End of Session - 01/22/23 1128     Visit Number 10    Number of Visits 30    Date for PT Re-Evaluation 12/31/22    Authorization Type BCBS    Authorization Time Period 09/23/21 - 09/23/23 30visit    Authorization - Visit Number 10    Authorization - Number of Visits 30    Progress Note Due on Visit 20    PT Start Time 1131    PT Stop Time 1215    PT Time Calculation (min) 44 min    Equipment Utilized During Treatment Gait belt    Activity Tolerance Patient tolerated treatment well;No increased pain              Past Medical History:  Diagnosis Date   Oral cancer (HCC)    Personal history of chemotherapy    Personal history of radiation therapy    Scleroderma (HCC)    Past Surgical History:  Procedure Laterality Date   ABDOMINAL HYSTERECTOMY     There are no problems to display for this patient.   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   PRECAUTIONS: none   SUBJECTIVE:   Pt reports no new complaints upon arrival.  Pt notes that she is doing well and has been consistent with her HEP.  PAIN:  Are you having pain? No   TODAY'S TREATMENT:  TherEx   Goal assessment performed and noted below:  Rolling red physioball  up wall and stepping into ball to increase shoulder flexion, 2x10 Wall push-ups, 2x10    Manual:  To reduce pain and tissue tension, improve range of motion, neuromodulation, in order to promote improved ability to complete functional activities.   Supine STM to cervical region to increase extensibility of the paraspinals Supine suboccipital release technique to decrease cervicalgia Supine manual traction performed in order to increase joint space in cervical region for pain relief Supine UT/Levator stretch, 30 sec bouts to increase tissue extensibility of the cervical region    PATIENT EDUCATION: Education details: therex form/technique  Person educated: Patient Education method: Programmer, multimedia, Facilities manager, and Verbal cues Education comprehension: verbalized understanding, returned demonstration, and verbal cues required   HOME EXERCISE PROGRAM: Access Code: [email or web address] - Standing Low Trap Setting with Resistance at Wall  - 1 x daily - 2 x weekly - 3 sets - 8-12 reps - Squat with Overhead Press  - 1 x daily - 2 x weekly - 3 sets - 8-12 reps - Bird Dog  - 1 x daily - 2 x weekly - 3 sets - 12-20 reps - Forearm Plank on Wall  - 1 x daily - 7 x weekly - 3 reps - 30sec hold   Clinical Impression:   Pt assessed for goals and underwent manual therapy during treatment session.  Pt has regressed in certain goals, likely due to reduced frequency from insurance complications and limitations on place on visits.  Pt still has significant neck tightness and postural stability that will need to be monitored throughout the remaining sessions.  Patient's condition has the potential to improve in response to therapy. Maximum improvement is yet to be obtained. The anticipated improvement is attainable and reasonable in a generally predictable time.  Pt will continue to benefit from skilled therapy to address remaining deficits in order to improve overall QoL and return to PLOF.        Short Term  Goals:  1.  Pt will be independent with HEP in order to improve strength and mobility in order to improve function in ADLs. Baseline: 07/31/22 New HEP updated; 09/05/22 completing 50% of dosing Goal status: ONGOING    Long Term Goals:  1.  Pt will be able to demonstrate plank of 30sec to demonstrate increased general stability endurance for progress toward age-matched norm of Baseline: 07/31/22 4sec 09/05/22 10sec 01/22/23: 22.34 sec Goal status: IN PROGRESS  2.  Patient will demonstrate full cervical rotation AROM in order to drive safely  Baseline: 01/25/08:  Rot R 45, Rot L 33. 07/31/22 R/L Rotation 54/34  01/22/23: R/L Rotation34/30 Goal status: IN PROGRESS  3.  Patient will demonstrate full R shoulder flex A/PROM in order to complete overhead ADLs  Baseline: 07/31/22: flexion 157 active/ 175 passive  01/08/23: flexion: 142 active 01/22/23: 140 active Goal status: IN PROGRESS     Nolon Bussing, PT, DPT Physical Therapist - [facility]  01/22/23, 2:40 PM

## 2023-02-05 ENCOUNTER — Ambulatory Visit: Payer: Medicare Other

## 2023-02-05 DIAGNOSIS — M79641 Pain in right hand: Secondary | ICD-10-CM

## 2023-02-05 DIAGNOSIS — M6281 Muscle weakness (generalized): Secondary | ICD-10-CM | POA: Diagnosis not present

## 2023-02-05 DIAGNOSIS — M25611 Stiffness of right shoulder, not elsewhere classified: Secondary | ICD-10-CM

## 2023-02-05 DIAGNOSIS — M542 Cervicalgia: Secondary | ICD-10-CM

## 2023-02-05 NOTE — Therapy (Signed)
OUTPATIENT PHYSICAL THERAPY TREATMENT     Patient Name: Whitney Mclean MRN: [id number] DOB:05-19-1966, 57 y.o., female Today's Date: 02/05/2023  PCP: Bethann Punches MD REFERRING PROVIDER: Durenda Age, MD   PT End of Session - 02/05/23 1124     Visit Number 11    Number of Visits 30    Date for PT Re-Evaluation 12/31/22    Authorization Type BCBS    Authorization Time Period 09/23/21 - 09/23/23 30visit    Authorization - Visit Number 11    Authorization - Number of Visits 30    Progress Note Due on Visit 20    PT Start Time 1119    PT Stop Time 1200    PT Time Calculation (min) 41 min    Equipment Utilized During Treatment Gait belt    Activity Tolerance Patient tolerated treatment well;No increased pain              Past Medical History:  Diagnosis Date   Oral cancer (HCC)    Personal history of chemotherapy    Personal history of radiation therapy    Scleroderma (HCC)    Past Surgical History:  Procedure Laterality Date   ABDOMINAL HYSTERECTOMY     There are no problems to display for this patient.   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   PRECAUTIONS: none   SUBJECTIVE:   Pt reports she is doing well and denies any complications since the last visit.  PAIN:  Are you having pain? No   TODAY'S TREATMENT:  TherEx    Rolling red physioball up wall and stepping into ball to increase shoulder flexion, 2x10 Wall push-ups, 2x10 Overhead ball slam 15#, x10 with cuing for max avail ROM with good carry over  Supine  cervical chin tucks for increased deep neck flexor activation, 2x10   Manual:  To reduce pain and tissue tension, improve range of motion, neuromodulation, in order to promote improved ability to complete functional activities.   Supine STM to cervical region to increase extensibility of the paraspinals Supine suboccipital release technique to decrease cervicalgia Supine manual traction performed in order to increase joint space in cervical region for pain relief Supine UT/Levator stretch, 30 sec bouts to increase tissue extensibility of the cervical region    PATIENT EDUCATION: Education details: therex form/technique  Person educated: Patient Education method: Programmer, multimedia, Facilities manager, and Verbal cues Education comprehension: verbalized understanding, returned demonstration, and verbal cues required   HOME EXERCISE PROGRAM: Access Code: [email or web address] - Standing Low Trap Setting with Resistance at Wall  - 1 x daily - 2 x weekly - 3 sets - 8-12 reps - Squat with Overhead Press  - 1 x daily - 2 x weekly - 3 sets - 8-12 reps - Bird Dog  - 1 x daily - 2 x weekly - 3 sets - 12-20 reps - Forearm Plank on Wall  - 1 x daily - 7 x weekly - 3 reps - 30sec hold   Clinical Impression:   Pt continues to demonstrate tightness in the neck, but notes some relief following therapy and manual  therapy.  Pt continues to put forth great effort although has reduced ROM and strength in the UE's.  Pt will continue to benefit from manual therapy and strengthening of the UE's in order to improve overall ROM and improve strength of the neck and shoulders necessary for driving.   Pt will continue to benefit from skilled therapy to address remaining deficits in order to improve overall QoL and return to PLOF.      Short Term Goals:  1.  Pt will be independent with HEP in order to improve strength and mobility in order to improve function in ADLs. Baseline: 07/31/22 New HEP updated; 09/05/22 completing 50% of  dosing Goal status: ONGOING    Long Term Goals:  1.  Pt will be able to demonstrate plank of 30sec to demonstrate increased general stability endurance for progress toward age-matched norm of Baseline: 07/31/22 4sec 09/05/22 10sec 01/22/23: 22.34 sec Goal status: IN PROGRESS  2.  Patient will demonstrate full cervical rotation AROM in order to drive safely  Baseline: 09/28/08:  Rot R 45, Rot L 33. 07/31/22 R/L Rotation 54/34  01/22/23: R/L Rotation34/30 Goal status: IN PROGRESS  3.  Patient will demonstrate full R shoulder flex A/PROM in order to complete overhead ADLs  Baseline: 07/31/22: flexion 157 active/ 175 passive  01/08/23: flexion: 142 active 01/22/23: 140 active Goal status: IN PROGRESS     Nolon Bussing, PT, DPT Physical Therapist - [facility]  02/05/23, 1:35 PM

## 2023-03-05 ENCOUNTER — Ambulatory Visit: Payer: Medicare Other | Attending: Internal Medicine

## 2023-03-05 DIAGNOSIS — M6281 Muscle weakness (generalized): Secondary | ICD-10-CM | POA: Diagnosis present

## 2023-03-05 DIAGNOSIS — M25611 Stiffness of right shoulder, not elsewhere classified: Secondary | ICD-10-CM | POA: Insufficient documentation

## 2023-03-05 DIAGNOSIS — M542 Cervicalgia: Secondary | ICD-10-CM | POA: Insufficient documentation

## 2023-03-05 NOTE — Therapy (Signed)
OUTPATIENT PHYSICAL THERAPY TREATMENT     Patient Name: Whitney Mclean MRN: [id number] DOB:03/19/1966, 57 y.o., female Today's Date: 03/05/2023  PCP: Bethann Punches MD REFERRING PROVIDER: Durenda Age, MD   PT End of Session - 03/05/23 1123     Visit Number 12    Number of Visits 30    Date for PT Re-Evaluation 05/28/23    Authorization Type BCBS    Authorization Time Period 09/23/22 - 09/23/23 30visit    Authorization - Visit Number 12    Authorization - Number of Visits 30    Progress Note Due on Visit 20    PT Start Time 1117    PT Stop Time 1159    PT Time Calculation (min) 42 min    Equipment Utilized During Treatment Gait belt    Activity Tolerance Patient tolerated treatment well;No increased pain    Behavior During Therapy WFL for tasks assessed/performed              Past Medical History:  Diagnosis Date   Oral cancer (HCC)    Personal history of chemotherapy    Personal history of radiation therapy    Scleroderma (HCC)    Past Surgical History:  Procedure Laterality Date   ABDOMINAL HYSTERECTOMY     There are no problems to display for this patient.   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   PRECAUTIONS: none   SUBJECTIVE:   Pt reports continuing to do well. No abnormal levels of pain outside of baseline.   PAIN:  Are you having pain? No   TODAY'S TREATMENT:  There.Ex:  UBE for BUE strength and thoracic mobility: 3 min forward/3 min backward   Quick review of goals as Whitney Mclean is new to pt and POC. See goals section for  details.  Rolling red physioball up wall and stepping into ball to increase shoulder flexion, 2x10  Wall push-ups, 2x10  Sitting thoracic extension over 1/2 bolster: 2x12   Reviewed supine low load long duration cervical flexor stretch. Reviewed safe completion on couch or bed. Educated to maintain 1-2 minutes to tolerance to start with. Reviewed importance of HEP compliance due to visit limit cap.    PATIENT EDUCATION: Education details: therex form/technique  Person educated: Patient Education method: Solicitor, and Verbal cues Education comprehension: verbalized understanding, returned demonstration, and verbal cues required   HOME EXERCISE PROGRAM: Access Code: [email or web address] - Standing Low Trap Setting with Resistance at Wall  - 1 x daily - 2 x weekly - 3 sets - 8-12 reps - Squat with Overhead Press  - 1 x daily - 2 x weekly - 3 sets - 8-12 reps - Bird Dog  - 1 x daily - 2 x weekly - 3 sets - 12-20 reps - Forearm Plank on Wall  - 1 x daily - 7 x weekly - 3 reps - 30sec hold   Clinical Impression: Continuing PT POC with focus on thoracic, shoulder, and cervical mobility. Brief review of goals as Whitney Mclean is new to pt and her POC. Reviewed importance of HEP compliance to maximize available mobility and strengthening as pt  has visit cap for annual PT visits. Reviewed low load long duration stretching to assist in cervical mobility. Pt will continue to benefit from skilled therapy to address remaining deficits in order to improve overall QoL and return to PLOF.     Short Term Goals:  1.  Pt will be independent with HEP in order to improve strength and mobility in order to improve function in ADLs. Baseline: 07/31/22 New HEP updated; 09/05/22 completing 50% of dosing; 03/05/23: 50% compliance of dosing Goal status: ONGOING    Long Term Goals:  1.  Pt will be able to demonstrate plank of 30sec to demonstrate increased general stability endurance for progress toward  age-matched norm of 1 min Baseline: 07/31/22 4 sec 09/05/22 10 sec 01/22/23: 22.34 sec 03/05/23: 23.07 sec Goal status: IN PROGRESS  2.  Patient will demonstrate full cervical rotation AROM in order to drive safely  Baseline: 09/28/08:  Rot R 45, Rot L 33. 07/31/22 R/L Rotation 54/34  01/22/23: R/L Rotation34/30 03/05/23: R/L Rotation 30/25 Goal status: IN PROGRESS  3.  Patient will demonstrate full R shoulder flex A/PROM in order to complete overhead ADLs  Baseline: 07/31/22: flexion 157 active/ 175 passive  01/08/23: flexion: 142 active 01/22/23: 140 active 03/05/23: 150 AROM, 167 PROM  Goal status: IN PROGRESS    Whitney Mclean. Fairly IV, PT, DPT Physical Therapist- [place]  [facility]  03/05/23, 12:12 PM

## 2023-03-19 ENCOUNTER — Ambulatory Visit: Payer: Medicare Other

## 2023-03-19 DIAGNOSIS — M6281 Muscle weakness (generalized): Secondary | ICD-10-CM | POA: Diagnosis not present

## 2023-03-19 DIAGNOSIS — M542 Cervicalgia: Secondary | ICD-10-CM

## 2023-03-19 DIAGNOSIS — M25611 Stiffness of right shoulder, not elsewhere classified: Secondary | ICD-10-CM

## 2023-03-19 NOTE — Therapy (Signed)
OUTPATIENT PHYSICAL THERAPY TREATMENT     Patient Name: DONETTA ISAZA MRN: [id number] DOB:[birth year], 57 y.o., female Today's Date: 03/19/2023  PCP: Bethann Punches MD REFERRING PROVIDER: Durenda Age, MD   PT End of Session - 03/19/23 1117     Visit Number 13    Number of Visits 30    Date for PT Re-Evaluation 05/28/23    Authorization Type [employer]    Authorization Time Period 09/23/22 - 09/23/23 30visit    Authorization - Visit Number 13    Authorization - Number of Visits 30    Progress Note Due on Visit 20    PT Start Time 1116    PT Stop Time 1158    PT Time Calculation (min) 42 min    Equipment Utilized During Treatment Gait belt    Activity Tolerance Patient tolerated treatment well;No increased pain    Behavior During Therapy WFL for tasks assessed/performed              Past Medical History:  Diagnosis Date   Oral cancer (HCC)    Personal history of chemotherapy    Personal history of radiation therapy    Scleroderma (HCC)    Past Surgical History:  Procedure Laterality Date   ABDOMINAL HYSTERECTOMY     There are no problems to display for this patient.   REFERRING DIAG: sclaraderma  THERAPY DIAG:  Muscle weakness (generalized)  Cervicalgia  Stiffness of right shoulder, not elsewhere classified   Rationale for Evaluation and Treatment Rehabilitation   PERTINENT HISTORY:  Jodye Scali is a 55yoF who comes to [facility] OPPT due to ongoing global stiffness in neck and shoulders in the setting of new scleroderma s/p radiation and chemotherapy 2/2 oral cancer (2016) and removal of Rt lymph nodes.   PRECAUTIONS: none   SUBJECTIVE:   Pt reports continuing to do well. Does report limited HEP compliance. Normal pain 4/10 in LE's from neuropathy.   PAIN:  Are you having pain? No   TODAY'S TREATMENT:  There.Ex:   Nu- Step L5 for 5 min UE's/LE's for thoracic mobility and warm up    Bird dogs: 2x8/side. Min TC's on pelvis to maintain neutral  pelvic alignment. Good carryover on the second set.    D2 PNF pattern: 2x6. Mild assist with RUE.     STS with 6# med ball lift overhead: 2x8  Supine low load long duration cervical flexor stretch. 3 x 1 minute   PATIENT EDUCATION: Education details: therex form/technique  Person educated: Patient Education method: Programmer, multimedia, Demonstration, and Verbal cues Education comprehension: verbalized understanding, returned demonstration, and verbal cues required   HOME EXERCISE PROGRAM: Access Code: [email or web address] - Standing Low Trap Setting with Resistance at Wall  - 1 x daily - 2 x weekly - 3 sets - 8-12 reps - Squat with Overhead Press  - 1 x daily - 2 x weekly - 3 sets - 8-12 reps - Bird Dog  - 1 x daily - 2 x weekly - 3 sets - 12-20 reps - Forearm Plank on Wall  - 1 x daily - 7 x weekly - 3 reps - 30sec hold   Clinical Impression: Continuing with thoracic and cervical AROM with core, thoracic, and periscapular strengthening. Continuing to encourage pt on HEP compliance to maximize functional mobility and independence with ADL's. Pt with overall good understanding of exercise this date. Continues to be limited in cervical AROM and RUE weakness as expected from [facility]. Pt will continue to benefit from skilled therapy to  address remaining deficits in order to improve overall QoL and return to PLOF.     Short Term Goals:  1.  Pt will be independent with HEP in order to improve strength and mobility in order to improve function in ADLs. Baseline: 07/31/22 New HEP updated; 09/05/22 completing 50% of dosing; 03/05/23: 50% compliance of dosing Goal status: ONGOING    Long Term Goals:  1.  Pt will be able to demonstrate plank of 30sec to demonstrate increased general stability endurance for progress toward age-matched norm of 1 min Baseline: 07/31/22 4 sec 09/05/22 10 sec 01/22/23: 22.34 sec 03/05/23: 23.07 sec Goal status: IN PROGRESS  2.  Patient will demonstrate full cervical rotation AROM in  order to drive safely  Baseline: 04/24/94:  Rot R 45, Rot L 33. 07/31/22 R/L Rotation 54/34  01/22/23: R/L Rotation34/30 03/05/23: R/L Rotation 30/25 Goal status: IN PROGRESS  3.  Patient will demonstrate full R shoulder flex A/PROM in order to complete overhead ADLs  Baseline: 07/31/22: flexion 157 active/ 175 passive  01/08/23: flexion: 142 active 01/22/23: 140 active 03/05/23: 150 AROM, 167 PROM  Goal status: IN PROGRESS    [name]. Fairly IV, PT, DPT Physical Therapist- [place]  [facility]  03/19/23, 12:55 PM

## 2023-04-02 ENCOUNTER — Ambulatory Visit: Payer: Medicare Other

## 2023-04-16 ENCOUNTER — Other Ambulatory Visit: Payer: Self-pay | Admitting: Physician Assistant

## 2023-04-16 ENCOUNTER — Ambulatory Visit: Payer: Medicare Other

## 2023-04-16 ENCOUNTER — Ambulatory Visit
Admission: RE | Admit: 2023-04-16 | Discharge: 2023-04-16 | Disposition: A | Discharge status: Home / Self Care | Payer: Medicare Other | Source: Ambulatory Visit | Attending: Physician Assistant | Admitting: Physician Assistant

## 2023-04-16 DIAGNOSIS — Y92009 Unspecified place in unspecified non-institutional (private) residence as the place of occurrence of the external cause: Secondary | ICD-10-CM | POA: Diagnosis not present

## 2023-04-16 DIAGNOSIS — W19XXXS Unspecified fall, sequela: Secondary | ICD-10-CM | POA: Diagnosis not present

## 2023-04-16 DIAGNOSIS — R0602 Shortness of breath: Secondary | ICD-10-CM

## 2023-04-16 DIAGNOSIS — R0781 Pleurodynia: Secondary | ICD-10-CM

## 2023-04-16 DIAGNOSIS — M4854XA Collapsed vertebra, not elsewhere classified, thoracic region, initial encounter for fracture: Secondary | ICD-10-CM | POA: Diagnosis not present

## 2023-04-16 DIAGNOSIS — J439 Emphysema, unspecified: Secondary | ICD-10-CM | POA: Insufficient documentation

## 2023-04-16 DIAGNOSIS — I700 Atherosclerosis of aorta: Secondary | ICD-10-CM | POA: Diagnosis not present

## 2023-05-09 ENCOUNTER — Other Ambulatory Visit: Payer: Self-pay | Admitting: Internal Medicine

## 2023-05-09 DIAGNOSIS — S12690A Other displaced fracture of seventh cervical vertebra, initial encounter for closed fracture: Secondary | ICD-10-CM

## 2023-05-11 ENCOUNTER — Ambulatory Visit
Admission: RE | Admit: 2023-05-11 | Discharge: 2023-05-11 | Disposition: A | Discharge status: Home / Self Care | Payer: Medicare Other | Source: Ambulatory Visit | Attending: Internal Medicine | Admitting: Internal Medicine

## 2023-05-11 DIAGNOSIS — S12690A Other displaced fracture of seventh cervical vertebra, initial encounter for closed fracture: Secondary | ICD-10-CM | POA: Insufficient documentation

## 2023-05-15 NOTE — Progress Notes (Signed)
Referring Physician:  Danella Penton, MD [address] [facility] [place],  [place] [address]  Primary Physician:  Danella Penton, MD  History of Present Illness: 05/15/2023 [name] is here today with a chief complaint of multilevel cervical thoracic compression fractures.  She has a history of severe osteoporosis.  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  She has had a dropped head ever since 2019.  She does feel that this is uncomfortable but her most significant complaint is the compression fractures.  She states been going on for approximately 1 month, but she does feel that this is gotten somewhat better.  She discussed possibility of a kyphoplasty but given her severe osteoporosis and cervical deformity was choosing against it.  She does feel that it is worsened with being upright and improves with recumbency.  She is not having any neurologic deficits.  Conservative measures:  Physical therapy: yes, history of neck cancer  Multimodal medical therapy including regular antiinflammatories: Tylenol,  Tramadol, Hydrocodone Injections: no epidural steroid injections   I have utilized the care everywhere function in epic to review the outside records available from external health systems.  Review of Systems:  A 10 point review of systems is negative, except for the pertinent positives and negatives detailed in the HPI.  Past Medical History: Past Medical History:  Diagnosis Date   Oral cancer (HCC)    Personal history of chemotherapy    Personal history of radiation therapy    Scleroderma (HCC)     Past Surgical History: Past Surgical History:  Procedure Laterality Date   ABDOMINAL HYSTERECTOMY      Allergies: Allergies as of 05/19/2023 - Review Complete 03/19/2023  Allergen Reaction Noted   Oxycodone Itching 08/03/2015   Flagyl [metronidazole] Rash 11/10/2019     Medications:  Current Outpatient Medications:    acetaminophen (TYLENOL) 500 MG tablet, Take 500 mg by mouth every 6 (six) hours as needed., Disp: , Rfl:    CALCIUM PO, Take 1 tablet by mouth daily., Disp: , Rfl:    Multiple Vitamins-Minerals (MULTIVITAMIN WOMEN PO), Take 1 tablet by mouth daily., Disp: , Rfl:    ondansetron (ZOFRAN ODT) 4 MG disintegrating tablet, Take 1 tablet (4 mg total) by mouth every 8 (eight) hours as needed for nausea or vomiting., Disp: 20 tablet, Rfl: 0   ondansetron (ZOFRAN) 4 MG tablet, Take 1 tablet (4 mg total) by mouth daily as needed for nausea or vomiting. (Patient not taking: Reported on 12/08/2017), Disp: 20 tablet, Rfl: 1   potassium chloride (KLOR-CON) 10 MEQ tablet, Take 1 tablet (10 mEq total) by mouth daily for 7 days., Disp: 7 tablet, Rfl: 0   promethazine (PHENERGAN) 25 MG suppository, Place 1 suppository (25 mg total) rectally every 8 (eight) hours as needed for refractory nausea / vomiting., Disp: 12 suppository, Rfl: 0   sulfamethoxazole-trimethoprim (BACTRIM DS) 800-160 MG tablet, Take 1 tablet by mouth 2 (two) times daily., Disp: 20 tablet, Rfl: 0   varenicline (CHANTIX PAK) 0.5 MG X 11 & 1 MG X 42 tablet, Take by mouth 2 (two) times daily. Take one 0.5 mg tablet by mouth once daily for 3 days, then increase to one 0.5 mg tablet twice daily for 4 days, then increase to one 1 mg tablet twice daily., Disp: , Rfl:   Social History: Social History   Tobacco Use   Smoking status: Former   Smokeless tobacco:  Never  Substance Use Topics   Alcohol use: Yes    Family Medical History: Family History  Problem Relation Age of Onset   Breast cancer Mother 51   Breast cancer Maternal Grandmother 43    Physical Examination: There were no vitals filed for this visit.  General: Patient is in no apparent distress. Attention to examination is appropriate.  Neck:   Supple.  Full range of motion.  Respiratory: Patient is breathing without any  difficulty.   NEUROLOGICAL:     Awake, alert, oriented to person, place, and time.  Speech is clear and fluent.   Cranial Nerves: Pupils equal round and reactive to light.  Facial tone is symmetric.  Facial sensation is symmetric. Shoulder shrug is symmetric. Tongue protrusion is midline.    Strength: Side Biceps Triceps Deltoid Interossei Grip Wrist Ext. Wrist Flex.  R 5 5 5 5 5 5 5   L 5 5 5 5 5 5 5    Reflexes are 1+ and symmetric at the biceps, triceps, brachioradialis, patella and achilles.   Hoffman's is absent. Clonus is absent  She does have some distal greater than proximal sensation changes     No evidence of dysmetria noted.  Gait is normal.    Imaging: arrative & Impression  CLINICAL DATA:  The patient suffered cervical and thoracic spine compression fractures in a fall 03/25/2023. Subsequent encounter.   EXAM: MRI CERVICAL SPINE WITHOUT CONTRAST   TECHNIQUE: Multiplanar, multisequence MR imaging of the cervical spine was performed. No intravenous contrast was administered.   COMPARISON:  CT chest 04/16/2023.   FINDINGS: Alignment: No listhesis. Mild kyphosis is centered about the T1 level.   Vertebrae: The patient has a superior endplate compression fracture of C7 with vertebral body height loss of approximately 30%, a biconcave compression fracture of T1 vertebral body height loss of approximately 50% and a biconcave compression fracture T3 with vertebral body height loss centrally of approximately 80%. There is marrow edema within each of the vertebral bodies consistent with acute or early subacute injuries. Marrow edema is also seen in the right fifth and sixth ribs due to the fractures seen on the prior CT.   Cord: Normal signal throughout the visualized cord.   Posterior Fossa, vertebral arteries, paraspinal tissues: Negative.   Disc levels:   C2-3: Negative.   C3-4: Negative.   C4-5: Negative.   C5-6: Negative.   C6-7: Shallow disc  bulge without stenosis. No retropulsion off the superior endplate of C7.   J1-B1: Shallow disc bulge without stenosis. No retropulsion off the superior endplate T1.   T1-2: Negative.   T2-3: Negative.   T3-4: Minimal disc bulge without stenosis.   IMPRESSION: 1. Acute or early subacute compression fractures of C7, T1 and T3 as described above. No retropulsion off the superior endplates of C7 or T1. The central spinal canal and neural foramina are open at all levels. 2. Marrow edema in the right fifth and sixth ribs due to the fractures seen on the prior CT.     Electronically Signed   By: Drusilla Kanner M.D.   On: 05/11/2023 10:15    I have personally reviewed the images and agree with the above interpretation.  Medical Decision Making/Assessment and Plan: Ms. Dukes is a pleasant 57 y.o. 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  She has had a dropped head since 2019.  Not had any treatment for this  specifically.  She had a recent fall in which she developed compression fractures as dull aching pain in the midline cervical spine.  She was likely deep predisposed to this given her severe osteoporosis, prior radiation, and longstanding cervical deformity.  This is somewhat reducible by her active range of motion, however given the decreased neck tone she reverts back to a chin on chest position.  We did discuss her compression fractures, she had a previous discussion about whether or not a kyphoplasty would benefit her, however given her severe osteoporosis as well as her cervical deformity she was not deemed to be a good candidate for this intervention.  We did discuss possibility of using a CTO type brace to help with her posture.  At this time she will plan on managing conservatively.  She does not need to follow-up with neurosurgery at this point.  We discussed referral to a  cervical deformity surgeon however she would like to avoid this as she does not feel the risk of surgery is worth the benefit.   Thank you for involving me in the care of this patient.    Lovenia Kim MD/MSCR Neurosurgery

## 2023-05-16 ENCOUNTER — Other Ambulatory Visit: Payer: Self-pay | Admitting: Family Medicine

## 2023-05-16 ENCOUNTER — Inpatient Hospital Stay
Admission: RE | Admit: 2023-05-16 | Discharge: 2023-05-16 | Disposition: A | Discharge status: Home / Self Care | Payer: Self-pay | Source: Ambulatory Visit | Attending: Neurosurgery | Admitting: Neurosurgery

## 2023-05-16 DIAGNOSIS — Z049 Encounter for examination and observation for unspecified reason: Secondary | ICD-10-CM

## 2023-05-19 ENCOUNTER — Ambulatory Visit: Payer: Medicare Other | Admitting: Neurosurgery

## 2023-05-19 ENCOUNTER — Encounter: Payer: Self-pay | Admitting: Neurosurgery

## 2023-05-19 VITALS — BP 100/62 | Ht 64.0 in | Wt 106.2 lb

## 2023-05-19 DIAGNOSIS — M810 Age-related osteoporosis without current pathological fracture: Secondary | ICD-10-CM | POA: Insufficient documentation

## 2023-05-19 DIAGNOSIS — S22010A Wedge compression fracture of first thoracic vertebra, initial encounter for closed fracture: Secondary | ICD-10-CM | POA: Diagnosis not present

## 2023-05-19 DIAGNOSIS — M40293 Other kyphosis, cervicothoracic region: Secondary | ICD-10-CM

## 2023-05-19 DIAGNOSIS — M4013 Other secondary kyphosis, cervicothoracic region: Secondary | ICD-10-CM

## 2023-05-19 DIAGNOSIS — W19XXXA Unspecified fall, initial encounter: Secondary | ICD-10-CM

## 2023-05-19 DIAGNOSIS — M8000XD Age-related osteoporosis with current pathological fracture, unspecified site, subsequent encounter for fracture with routine healing: Secondary | ICD-10-CM

## 2023-05-19 DIAGNOSIS — S12690A Other displaced fracture of seventh cervical vertebra, initial encounter for closed fracture: Secondary | ICD-10-CM

## 2023-05-19 DIAGNOSIS — S22030A Wedge compression fracture of third thoracic vertebra, initial encounter for closed fracture: Secondary | ICD-10-CM

## 2023-05-19 DIAGNOSIS — Z8589 Personal history of malignant neoplasm of other organs and systems: Secondary | ICD-10-CM

## 2023-06-24 ENCOUNTER — Ambulatory Visit: Payer: Medicare Other | Attending: Surgery

## 2023-06-24 DIAGNOSIS — M6281 Muscle weakness (generalized): Secondary | ICD-10-CM | POA: Insufficient documentation

## 2023-06-24 DIAGNOSIS — M542 Cervicalgia: Secondary | ICD-10-CM | POA: Diagnosis present

## 2023-06-24 NOTE — Therapy (Addendum)
OUTPATIENT PHYSICAL THERAPY CERVICAL RE- EVALUATION   Patient Name: Whitney Mclean MRN: [id number] DOB:[birth year], 57 y.o., female Today's Date: 06/24/2023  END OF SESSION:  PT End of Session - 06/24/23 1655     Visit Number 1    Number of Visits 17    Date for PT Re-Evaluation 08/19/23    PT Start Time 0315    PT Stop Time 0358    PT Time Calculation (min) 43 min    Activity Tolerance Patient tolerated treatment well    Behavior During Therapy [facility] for tasks assessed/performed             Past Medical History:  Diagnosis Date   Oral cancer (HCC)    Personal history of chemotherapy    Personal history of radiation therapy    Scleroderma (HCC)    Past Surgical History:  Procedure Laterality Date   ABDOMINAL HYSTERECTOMY     Patient Active Problem List   Diagnosis Date Noted   Other secondary kyphosis, cervicothoracic region 05/19/2023   Compression fracture of T3 vertebra (HCC) 05/19/2023   Compression fracture of C7 vertebra (HCC) 05/19/2023   Osteoporosis 05/19/2023    PCP: Danella Penton, MD  REFERRING PROVIDER: Danella Penton, MD   REFERRING DIAG: S12.600A (ICD-10-CM) - Unspecified displaced fracture of seventh cervical vertebra, initial encounter for closed fracture  THERAPY DIAG:  Muscle weakness (generalized)  Cervicalgia  Rationale for Evaluation and Treatment: Rehabilitation  ONSET DATE: July 2024  SUBJECTIVE:                                                                                                                                                                                                         SUBJECTIVE STATEMENT: Pt is a pleasant 57 y/o F presenting to OPPT for a re-evaluation of her limited cervical mobility.    PERTINENT HISTORY:  Pt presents to PT w/ chronic limited cervical mobility 2/2 severe osteoporosis, scleroderma, and hx of cervical fibrosis of the neck. Since previous bout of PT patient had a fall in the restroom  resulting in three fx vertebrae, fx of six ribs, and a fx of her R shoulder blade. Pt's current fx are being managed non-surgically. Pt reports no pain or sx of N/T. She states she has the most difficulty with maintaining a seated position with no back support and is only able to maintain this position for 2-3 minutes before requiring back support. Pt also reports an increased fear of falling.   PAIN:  Are you having pain? No Main aggravation cannot sit up  without back support, increased fear of falling.  Easing factors: having a supported back 2-3 minutes of sitting unsupported before needing support  PRECAUTIONS: Fall  RED FLAGS: None     WEIGHT BEARING RESTRICTIONS: No  FALLS:  Has patient fallen in last 6 months? Yes. Number of falls 1 and near falls  LIVING ENVIRONMENT: Has following equipment at home: Walker - 2 wheeled *Pt does not use RW often  OCCUPATION: Retired, after scleroderma dx  PLOF: Independent  PATIENT GOALS: To get moving again   NEXT MD VISIT: N/A  OBJECTIVE:  Note: Objective measures were completed at Evaluation unless otherwise noted.  DIAGNOSTIC FINDINGS:  CLINICAL DATA:  The patient suffered cervical and thoracic spine compression fractures in a fall 03/25/2023. Subsequent encounter.   EXAM: MRI CERVICAL SPINE WITHOUT CONTRAST   TECHNIQUE: Multiplanar, multisequence MR imaging of the cervical spine was performed. No intravenous contrast was administered.   COMPARISON:  CT chest 04/16/2023.   FINDINGS: Alignment: No listhesis. Mild kyphosis is centered about the T1 level.   Vertebrae: The patient has a superior endplate compression fracture of C7 with vertebral body height loss of approximately 30%, a biconcave compression fracture of T1 vertebral body height loss of approximately 50% and a biconcave compression fracture T3 with vertebral body height loss centrally of approximately 80%. There is marrow edema within each of the vertebral  bodies consistent with acute or early subacute injuries. Marrow edema is also seen in the right fifth and sixth ribs due to the fractures seen on the prior CT.   Cord: Normal signal throughout the visualized cord.   Posterior Fossa, vertebral arteries, paraspinal tissues: Negative.   IMPRESSION: 1. Acute or early subacute compression fractures of C7, T1 and T3 as described above. No retropulsion off the superior endplates of C7 or T1. The central spinal canal and neural foramina are open at all levels. 2. Marrow edema in the right fifth and sixth ribs due to the fractures seen on the prior CT.  PATIENT SURVEYS:  FOTO 48/57  COGNITION: Overall cognitive status: Within functional limits for tasks assessed  SENSATION: WFL  POSTURE: rounded shoulders and forward head  PALPATION: No TTP noted    CERVICAL ROM:   Active ROM A/PROM (deg) eval  Flexion 45 deg  Extension 10 deg  Right lateral flexion 40 deg  Left lateral flexion 35 deg*  Right rotation 40 deg  Left rotation 25 deg*   (Blank rows = not tested)  All cervical PROM= AROM   UPPER EXTREMITY ROM:  Active ROM Right eval Left eval  Shoulder flexion    Shoulder extension    Shoulder abduction 100 deg   Shoulder adduction    Shoulder extension    Shoulder internal rotation C7 C7  Shoulder external rotation Mid- thoracic Mid- thoracic  Elbow flexion    Elbow extension    Wrist flexion    Wrist extension    Wrist ulnar deviation    Wrist radial deviation    Wrist pronation    Wrist supination     (Blank rows = not tested)  UPPER EXTREMITY MMT:  MMT Right eval Left eval  Shoulder flexion 4 4-  Shoulder extension    Shoulder abduction 4+ 4+  Shoulder adduction    Shoulder extension    Shoulder internal rotation 4 4  Shoulder external rotation 4 4  Middle trapezius    Lower trapezius    Elbow flexion 4+ 4+  Elbow extension 4+ 4+  Wrist flexion  Wrist extension    Wrist ulnar deviation     Wrist radial deviation    Wrist pronation    Wrist supination    Grip strength 4 4   (Blank rows = not tested)  CERVICAL SPECIAL TESTS:  Deferred 2/2 to limited cervical mobility  FUNCTIONAL TESTS:  DNF Endurance Test: Deferred to next session   TODAY'S TREATMENT: DATE: 06/24/23  Eval Only  Reviewed pt's HEP (reps/sets/ frequency)    PATIENT EDUCATION:  Education details: HEP given to pt Person educated: Patient Education method: Medical illustrator Education comprehension: verbalized understanding and returned demonstration  HOME EXERCISE PROGRAM: Access Code: [email or web address] URL: [email or web address] Date: 06/24/2023 Prepared by: Tomasa Hose  Exercises - Supine Chin Tuck  - 1 x daily - 4-5 x weekly - 2 sets - 10 reps - Cervical Extension AROM with Strap  - 1 x daily - 4-5 x weekly - 2 sets - 10 reps - Seated Levator Scapulae Stretch  - 1 x daily - 4-5 x weekly - 1 sets - 2 reps - 30 hold - Seated scapular retraction   - 1 x daily - 4-5 x weekly - 2 sets - 10 reps  ASSESSMENT:  CLINICAL IMPRESSION: Patient is a 57 y.o. F who was seen today for physical therapy evaluation and treatment for chronic limited cervical mobility. Upon examination, pt noting significant decreased cervical mobility in all planes along w/ decreased bilat shoulder strength (see above). Pt has multiple co-morbidities contributing to her limited mobility. Pt presents w/ significant forward head positioning and notes increased difficulty maintaining seated posture unsupported for only 2-3 minutes before needing back support. Pt would benefit from skilled PT interventions to address her limited cervical mobility and decreased shoulder strength to functionally improve her ability to perform ADL's within the home.   OBJECTIVE IMPAIRMENTS: decreased mobility, decreased ROM, decreased strength, hypomobility, and impaired flexibility.   ACTIVITY LIMITATIONS: carrying and  lifting  PARTICIPATION LIMITATIONS: community activity  PERSONAL FACTORS: Age, Past/current experiences, Time since onset of injury/illness/exacerbation, and 3+ comorbidities: scleroderma, osteoporosis, hx of cancer  are also affecting patient's functional outcome.   REHAB POTENTIAL: Fair multi- comorbidities  CLINICAL DECISION MAKING: Evolving/moderate complexity  EVALUATION COMPLEXITY: Moderate   GOALS: Goals reviewed with patient? Yes  SHORT TERM GOALS: Target date: 07/22/23  Pt will be independent with HEP to improve cervical and shoulder strength with functional activities   Baseline: 06/24/23: HEP given to pt Goal status: INITIAL   LONG TERM GOALS: Target date: 08/19/23  Pt will improve FOTO to target score to demonstrate clinically significant improvement in functional mobility.   Baseline: 06/24/23: 48/57 Goal status: INITIAL  2.  Pt will improve all L shoulder AROM 4+ to improve functional mobility of bilat UE's.  Baseline: 06/24/23:  MMT Right eval Left eval  Shoulder flexion 4 4-  Shoulder extension    Shoulder abduction 4+ 4+  Shoulder adduction    Shoulder extension    Shoulder internal rotation 4 4  Shoulder external rotation 4 4   Goal status: INITIAL  3.  Pt will improve all cervical AROM to [facility] to improve functional mobility.  Baseline: 06/24/23:  Active ROM A/PROM (deg) eval  Flexion 45 deg  Extension 10 deg  Right lateral flexion 40 deg  Left lateral flexion 35 deg*  Right rotation 40 deg  Left rotation 25 deg*   Goal status: INITIAL   PLAN:  PT FREQUENCY: 1x/week  PT DURATION: 8 weeks  PLANNED INTERVENTIONS: Therapeutic exercises, Therapeutic activity, Neuromuscular  re-education, Balance training, Gait training, Patient/Family education, Self Care, Joint mobilization, Spinal manipulation, Spinal mobilization, Cryotherapy, Moist heat, Manual therapy, and Re-evaluation  PLAN FOR NEXT SESSION: FGA?, review HEP   Marney Doctor,  Student-PT 06/24/2023, 5:20 PM  Nolon Bussing, PT, DPT Physical Therapist - [facility]  06/24/23, 5:24 PM

## 2023-07-01 ENCOUNTER — Ambulatory Visit: Payer: Medicare Other

## 2023-07-01 DIAGNOSIS — M6281 Muscle weakness (generalized): Secondary | ICD-10-CM

## 2023-07-01 DIAGNOSIS — M542 Cervicalgia: Secondary | ICD-10-CM

## 2023-07-01 NOTE — Therapy (Addendum)
OUTPATIENT PHYSICAL THERAPY CERVICAL TREATMENT   Patient Name: Whitney Mclean MRN: [id number] DOB:February 27, 1966, 57 y.o., female Today's Date: 07/01/2023  END OF SESSION:  PT End of Session - 07/01/23 1338     Visit Number 2    Number of Visits 9    Date for PT Re-Evaluation 08/19/23    Authorization - Number of Visits 30    PT Start Time 1343    PT Stop Time 1427    PT Time Calculation (min) 44 min    Activity Tolerance Patient tolerated treatment well    Behavior During Therapy WFL for tasks assessed/performed             Past Medical History:  Diagnosis Date   Oral cancer (HCC)    Personal history of chemotherapy    Personal history of radiation therapy    Scleroderma (HCC)    Past Surgical History:  Procedure Laterality Date   ABDOMINAL HYSTERECTOMY     Patient Active Problem List   Diagnosis Date Noted   Other secondary kyphosis, cervicothoracic region 05/19/2023   Compression fracture of T3 vertebra (HCC) 05/19/2023   Compression fracture of C7 vertebra (HCC) 05/19/2023   Osteoporosis 05/19/2023    PCP: Danella Penton, MD  REFERRING PROVIDER: Danella Penton, MD   REFERRING DIAG: S12.600A (ICD-10-CM) - Unspecified displaced fracture of seventh cervical vertebra, initial encounter for closed fracture  THERAPY DIAG:  Cervicalgia  Muscle weakness (generalized)  Rationale for Evaluation and Treatment: Rehabilitation  ONSET DATE: July 2024  SUBJECTIVE:                                                                                                                                                                                                         SUBJECTIVE STATEMENT: Pt reports no pain at today's session. She reports being HEP compliant.    PERTINENT HISTORY:  Pt presents to PT w/ chronic limited cervical mobility 2/2 severe osteoporosis, scleroderma, and hx of cervical fibrosis of the neck. Since previous bout of PT patient had a fall in the  restroom resulting in three fx vertebrae, fx of six ribs, and a fx of her R shoulder blade. Pt's current fx are being managed non-surgically. Pt reports no pain or sx of N/T. She states she has the most difficulty with maintaining a seated position with no back support and is only able to maintain this position for 2-3 minutes before requiring back support. Pt also reports an increased fear of falling.   PAIN:  Are you having pain? No Main aggravation cannot  sit up without back support, increased fear of falling.  Easing factors: having a supported back 2-3 minutes of sitting unsupported before needing support  PRECAUTIONS: Fall  RED FLAGS: None     WEIGHT BEARING RESTRICTIONS: No  FALLS:  Has patient fallen in last 6 months? Yes. Number of falls 1 and near falls  LIVING ENVIRONMENT: Has following equipment at home: Walker - 2 wheeled *Pt does not use RW often  OCCUPATION: Retired, after scleroderma dx  PLOF: Independent  PATIENT GOALS: To get moving again   NEXT MD VISIT: N/A  OBJECTIVE:  Note: Objective measures were completed at Evaluation unless otherwise noted.  DIAGNOSTIC FINDINGS:  CLINICAL DATA:  The patient suffered cervical and thoracic spine compression fractures in a fall 03/25/2023. Subsequent encounter.   EXAM: MRI CERVICAL SPINE WITHOUT CONTRAST   TECHNIQUE: Multiplanar, multisequence MR imaging of the cervical spine was performed. No intravenous contrast was administered.   COMPARISON:  CT chest 04/16/2023.   FINDINGS: Alignment: No listhesis. Mild kyphosis is centered about the T1 level.   Vertebrae: The patient has a superior endplate compression fracture of C7 with vertebral body height loss of approximately 30%, a biconcave compression fracture of T1 vertebral body height loss of approximately 50% and a biconcave compression fracture T3 with vertebral body height loss centrally of approximately 80%. There is marrow edema within each of the  vertebral bodies consistent with acute or early subacute injuries. Marrow edema is also seen in the right fifth and sixth ribs due to the fractures seen on the prior CT.   Cord: Normal signal throughout the visualized cord.   Posterior Fossa, vertebral arteries, paraspinal tissues: Negative.   IMPRESSION: 1. Acute or early subacute compression fractures of C7, T1 and T3 as described above. No retropulsion off the superior endplates of C7 or T1. The central spinal canal and neural foramina are open at all levels. 2. Marrow edema in the right fifth and sixth ribs due to the fractures seen on the prior CT.  PATIENT SURVEYS:  FOTO 48/57  COGNITION: Overall cognitive status: Within functional limits for tasks assessed  SENSATION: WFL  POSTURE: rounded shoulders and forward head  PALPATION: No TTP noted    CERVICAL ROM:   Active ROM A/PROM (deg) eval  Flexion 45 deg  Extension 10 deg  Right lateral flexion 40 deg  Left lateral flexion 35 deg*  Right rotation 40 deg  Left rotation 25 deg*   (Blank rows = not tested)  All cervical PROM= AROM   UPPER EXTREMITY ROM:  Active ROM Right eval Left eval  Shoulder flexion    Shoulder extension    Shoulder abduction 100 deg   Shoulder adduction    Shoulder extension    Shoulder internal rotation C7 C7  Shoulder external rotation Mid- thoracic Mid- thoracic  Elbow flexion    Elbow extension    Wrist flexion    Wrist extension    Wrist ulnar deviation    Wrist radial deviation    Wrist pronation    Wrist supination     (Blank rows = not tested)  UPPER EXTREMITY MMT:  MMT Right eval Left eval  Shoulder flexion 4 4-  Shoulder extension    Shoulder abduction 4+ 4+  Shoulder adduction    Shoulder extension    Shoulder internal rotation 4 4  Shoulder external rotation 4 4  Middle trapezius    Lower trapezius    Elbow flexion 4+ 4+  Elbow extension 4+ 4+  Wrist  flexion    Wrist extension    Wrist ulnar  deviation    Wrist radial deviation    Wrist pronation    Wrist supination    Grip strength 4 4   (Blank rows = not tested)  CERVICAL SPECIAL TESTS:  Deferred 2/2 to limited cervical mobility  FUNCTIONAL TESTS:  DNF Endurance Test: Deferred to next session   TODAY'S TREATMENT: DATE: 07/01/23  UBE x3 min forward, x1 min backward lvl 2.0 FGA administered: 21/30   Reviewed pt's HEP (reps/sets/ frequency)   Seated scapular retraction 2 x10 Seated levator scap stretch R/L 2 x30sec/ each side Seated cervical extension AROM SNAGS w/ pillowcase 2 x10 Supine chin tucks 2 x10   Low load long duration supine cervical flexor stretch w/ towel roll under cervico-thoracic spine and folded pillow under back of the head x2 minutes  Seated shoulder rows w/ GTB 2 x12  Shoulder Horizontal Abduction w/ RTB 2 x12 (theraband tied around pt's distal bilat wrists for pt comfort)    PATIENT EDUCATION:  Education details: HEP given to pt Person educated: Patient Education method: Medical illustrator Education comprehension: verbalized understanding and returned demonstration  HOME EXERCISE PROGRAM: Access Code: [email or web address] URL: [email or web address] Date: 06/24/2023 Prepared by: Tomasa Hose  Exercises - Supine Chin Tuck  - 1 x daily - 4-5 x weekly - 2 sets - 10 reps - Cervical Extension AROM with Strap  - 1 x daily - 4-5 x weekly - 2 sets - 10 reps - Seated Levator Scapulae Stretch  - 1 x daily - 4-5 x weekly - 1 sets - 2 reps - 30 hold - Seated scapular retraction   - 1 x daily - 4-5 x weekly - 2 sets - 10 reps  ASSESSMENT:  CLINICAL IMPRESSION: Session focused on administering the FGA, reviewing pt's HEP and progressing pt's cervico- thoracic mobility exercises. Pt scored a 21/30 on the FGA, this score categorizes the pt at an increase risk of falls and future sessions will incorporate balance activities to increase pt's confidence with balance during pt's functional  mobility. Pt notes good carryover following HEP prescription at the last visit. Pt continues to note decreased cervico-thoracic mobility and continue to benefit from skilled PT interventions to address remaining cervico-thoracic ROM, strength and balance deficits.   OBJECTIVE IMPAIRMENTS: decreased mobility, decreased ROM, decreased strength, hypomobility, and impaired flexibility.   ACTIVITY LIMITATIONS: carrying and lifting  PARTICIPATION LIMITATIONS: community activity  PERSONAL FACTORS: Age, Past/current experiences, Time since onset of injury/illness/exacerbation, and 3+ comorbidities: scleroderma, osteoporosis, hx of cancer  are also affecting patient's functional outcome.   REHAB POTENTIAL: Fair multi- comorbidities  CLINICAL DECISION MAKING: Evolving/moderate complexity  EVALUATION COMPLEXITY: Moderate   GOALS: Goals reviewed with patient? Yes  SHORT TERM GOALS: Target date: 07/22/23  Pt will be independent with HEP to improve cervical and shoulder strength with functional activities   Baseline: 06/24/23: HEP given to pt Goal status: INITIAL   LONG TERM GOALS: Target date: 08/19/23  Pt will improve FOTO to target score to demonstrate clinically significant improvement in functional mobility.   Baseline: 06/24/23: 48/57 Goal status: INITIAL  2.  Pt will improve all L shoulder AROM 4+ to improve functional mobility of bilat UE's.  Baseline: 06/24/23:  MMT Right eval Left eval  Shoulder flexion 4 4-  Shoulder extension    Shoulder abduction 4+ 4+  Shoulder adduction    Shoulder extension    Shoulder internal rotation 4 4  Shoulder external rotation 4 4  Goal status: INITIAL  3.  Pt will improve all cervical AROM to [facility] to improve functional mobility.  Baseline: 06/24/23:  Active ROM A/PROM (deg) eval  Flexion 45 deg  Extension 10 deg  Right lateral flexion 40 deg  Left lateral flexion 35 deg*  Right rotation 40 deg  Left rotation 25 deg*   Goal status:  INITIAL   PLAN:  PT FREQUENCY: 1x/week  PT DURATION: 8 weeks  PLANNED INTERVENTIONS: Therapeutic exercises, Therapeutic activity, Neuromuscular re-education, Balance training, Gait training, Patient/Family education, Self Care, Joint mobilization, Spinal manipulation, Spinal mobilization, Cryotherapy, Moist heat, Manual therapy, and Re-evaluation  PLAN FOR NEXT SESSION: Progress cervico-thoracic strength and ROM exercises incorporate functional balance exercises.    Lovie Macadamia, SPT  [name]. Fairly IV, PT, DPT Physical Therapist- [place]  [facility] 07/01/23, 3:45 PM

## 2023-07-08 ENCOUNTER — Ambulatory Visit: Payer: Medicare Other

## 2023-07-08 DIAGNOSIS — M6281 Muscle weakness (generalized): Secondary | ICD-10-CM | POA: Diagnosis not present

## 2023-07-08 DIAGNOSIS — M542 Cervicalgia: Secondary | ICD-10-CM

## 2023-07-08 NOTE — Therapy (Cosign Needed)
OUTPATIENT PHYSICAL THERAPY CERVICAL TREATMENT   Patient Name: Whitney Mclean MRN: [id number] DOB:[birth year], 57 y.o., female Today's Date: 07/09/2023  END OF SESSION:  PT End of Session - 07/08/23 1259     Visit Number 3    Number of Visits 9    Date for PT Re-Evaluation 08/19/23    Authorization - Number of Visits 30    PT Start Time 1300    PT Stop Time 1343    PT Time Calculation (min) 43 min    Activity Tolerance Patient tolerated treatment well    Behavior During Therapy WFL for tasks assessed/performed             Past Medical History:  Diagnosis Date   Oral cancer (HCC)    Personal history of chemotherapy    Personal history of radiation therapy    Scleroderma (HCC)    Past Surgical History:  Procedure Laterality Date   ABDOMINAL HYSTERECTOMY     Patient Active Problem List   Diagnosis Date Noted   Other secondary kyphosis, cervicothoracic region 05/19/2023   Compression fracture of T3 vertebra (HCC) 05/19/2023   Compression fracture of C7 vertebra (HCC) 05/19/2023   Osteoporosis 05/19/2023    PCP: Danella Penton, MD  REFERRING PROVIDER: Danella Penton, MD   REFERRING DIAG: S12.600A (ICD-10-CM) - Unspecified displaced fracture of seventh cervical vertebra, initial encounter for closed fracture  THERAPY DIAG:  Cervicalgia  Muscle weakness (generalized)  Rationale for Evaluation and Treatment: Rehabilitation  ONSET DATE: July 2024  SUBJECTIVE:                                                                                                                                                                                                         SUBJECTIVE STATEMENT: Pt reports no pain at today's session. She reports being HEP compliant.   PERTINENT HISTORY:  Pt presents to PT w/ chronic limited cervical mobility 2/2 severe osteoporosis, scleroderma, and hx of cervical fibrosis of the neck. Since previous bout of PT patient had a fall in the  restroom resulting in three fx vertebrae, fx of six ribs, and a fx of her R shoulder blade. Pt's current fx are being managed non-surgically. Pt reports no pain or sx of N/T. She states she has the most difficulty with maintaining a seated position with no back support and is only able to maintain this position for 2-3 minutes before requiring back support. Pt also reports an increased fear of falling.   PAIN:  Are you having pain? No Main aggravation cannot sit  up without back support, increased fear of falling.  Easing factors: having a supported back 2-3 minutes of sitting unsupported before needing support  PRECAUTIONS: Fall  RED FLAGS: None     WEIGHT BEARING RESTRICTIONS: No  FALLS:  Has patient fallen in last 6 months? Yes. Number of falls 1 and near falls  LIVING ENVIRONMENT: Has following equipment at home: Walker - 2 wheeled *Pt does not use RW often  OCCUPATION: Retired, after scleroderma dx  PLOF: Independent  PATIENT GOALS: To get moving again   NEXT MD VISIT: N/A  OBJECTIVE:  Note: Objective measures were completed at Evaluation unless otherwise noted.  DIAGNOSTIC FINDINGS:  CLINICAL DATA:  The patient suffered cervical and thoracic spine compression fractures in a fall 03/25/2023. Subsequent encounter.   EXAM: MRI CERVICAL SPINE WITHOUT CONTRAST   TECHNIQUE: Multiplanar, multisequence MR imaging of the cervical spine was performed. No intravenous contrast was administered.   COMPARISON:  CT chest 04/16/2023.   FINDINGS: Alignment: No listhesis. Mild kyphosis is centered about the T1 level.   Vertebrae: The patient has a superior endplate compression fracture of C7 with vertebral body height loss of approximately 30%, a biconcave compression fracture of T1 vertebral body height loss of approximately 50% and a biconcave compression fracture T3 with vertebral body height loss centrally of approximately 80%. There is marrow edema within each of the  vertebral bodies consistent with acute or early subacute injuries. Marrow edema is also seen in the right fifth and sixth ribs due to the fractures seen on the prior CT.   Cord: Normal signal throughout the visualized cord.   Posterior Fossa, vertebral arteries, paraspinal tissues: Negative.   IMPRESSION: 1. Acute or early subacute compression fractures of C7, T1 and T3 as described above. No retropulsion off the superior endplates of C7 or T1. The central spinal canal and neural foramina are open at all levels. 2. Marrow edema in the right fifth and sixth ribs due to the fractures seen on the prior CT.  PATIENT SURVEYS:  FOTO 48/57  COGNITION: Overall cognitive status: Within functional limits for tasks assessed  SENSATION: WFL  POSTURE: rounded shoulders and forward head  PALPATION: No TTP noted    CERVICAL ROM:   Active ROM A/PROM (deg) eval  Flexion 45 deg  Extension 10 deg  Right lateral flexion 40 deg  Left lateral flexion 35 deg*  Right rotation 40 deg  Left rotation 25 deg*   (Blank rows = not tested)  All cervical PROM= AROM   UPPER EXTREMITY ROM:  Active ROM Right eval Left eval  Shoulder flexion    Shoulder extension    Shoulder abduction 100 deg   Shoulder adduction    Shoulder extension    Shoulder internal rotation C7 C7  Shoulder external rotation Mid- thoracic Mid- thoracic  Elbow flexion    Elbow extension    Wrist flexion    Wrist extension    Wrist ulnar deviation    Wrist radial deviation    Wrist pronation    Wrist supination     (Blank rows = not tested)  UPPER EXTREMITY MMT:  MMT Right eval Left eval  Shoulder flexion 4 4-  Shoulder extension    Shoulder abduction 4+ 4+  Shoulder adduction    Shoulder extension    Shoulder internal rotation 4 4  Shoulder external rotation 4 4  Middle trapezius    Lower trapezius    Elbow flexion 4+ 4+  Elbow extension 4+ 4+  Wrist flexion  Wrist extension    Wrist ulnar  deviation    Wrist radial deviation    Wrist pronation    Wrist supination    Grip strength 4 4   (Blank rows = not tested)  CERVICAL SPECIAL TESTS:  Deferred 2/2 to limited cervical mobility  FUNCTIONAL TESTS:  DNF Endurance Test: Deferred to next session   SLS assessment RLE- 16 seconds LLE- 27 seconds w/ bilat UE support 07/08/23  TODAY'S TREATMENT: DATE: 07/08/23  Nustep x5 minutes lvl 3.0 seat #5 Standing shoulder rows w/ GTB 2 x12  Standing shoulder extensions w/ GTB 2 x12 Shoulder Horizontal Abduction w/ RTB 2 x12 (theraband tied around pt's distal bilat wrists for pt comfort)  Standing modified tandem stance w/ intermittent UE support RLE/LLE 1 x 30sec Standing tandem stance w/ intermittent UE support RLE/LLE 1 x30 sec  Reciprocal obstacle clearance over 6" obstacle (4) 3 x 10' down and back Lateral stepping obstacle clearance over 6" obstacle (4) 3 x10' down and back SLS assessment RLE- 16 seconds LLE- 27 seconds w/ bilat UE support   PATIENT EDUCATION:  Education details: HEP given to pt Person educated: Patient Education method: Medical illustrator Education comprehension: verbalized understanding and returned demonstration  HOME EXERCISE PROGRAM: Access Code: [email or web address] URL: [email or web address] Date: 06/24/2023 Prepared by: Tomasa Hose  Exercises - Supine Chin Tuck  - 1 x daily - 4-5 x weekly - 2 sets - 10 reps - Cervical Extension AROM with Strap  - 1 x daily - 4-5 x weekly - 2 sets - 10 reps - Seated Levator Scapulae Stretch  - 1 x daily - 4-5 x weekly - 1 sets - 2 reps - 30 hold - Seated scapular retraction   - 1 x daily - 4-5 x weekly - 2 sets - 10 reps  ASSESSMENT:  CLINICAL IMPRESSION: Session focused on progressing cervico-thoracic mobility and balance exercises. Pt continues to note improvements in cervico-thoracic mobility noted w/ improved activity tolerance at today's session. Pt shows increased difficulty with RLE>LLE  modified tandem stance noting increased postural sway to maintain positioning. This was also displayed w/ SLS assessment with achieving 16 seconds on the RLE and 27 seconds on the LLE before needing UE support. Pt also notes decreased toe clearance displayed when ambulating over 6" obstacle, resulting in knocking over obstacle. Pt would continue to benefit from skilled PT interventions to address remaining cervico-thoracic ROM, strength and balance deficits.   OBJECTIVE IMPAIRMENTS: decreased mobility, decreased ROM, decreased strength, hypomobility, and impaired flexibility.   ACTIVITY LIMITATIONS: carrying and lifting  PARTICIPATION LIMITATIONS: community activity  PERSONAL FACTORS: Age, Past/current experiences, Time since onset of injury/illness/exacerbation, and 3+ comorbidities: scleroderma, osteoporosis, hx of cancer  are also affecting patient's functional outcome.   REHAB POTENTIAL: Fair multi- comorbidities  CLINICAL DECISION MAKING: Evolving/moderate complexity  EVALUATION COMPLEXITY: Moderate   GOALS: Goals reviewed with patient? Yes  SHORT TERM GOALS: Target date: 07/22/23  Pt will be independent with HEP to improve cervical and shoulder strength with functional activities   Baseline: 06/24/23: HEP given to pt Goal status: INITIAL   LONG TERM GOALS: Target date: 08/19/23  Pt will improve FOTO to target score to demonstrate clinically significant improvement in functional mobility.   Baseline: 06/24/23: 48/57 Goal status: INITIAL  2.  Pt will improve all L shoulder AROM 4+ to improve functional mobility of bilat UE's.  Baseline: 06/24/23:  MMT Right eval Left eval  Shoulder flexion 4 4-  Shoulder extension    Shoulder abduction 4+  4+  Shoulder adduction    Shoulder extension    Shoulder internal rotation 4 4  Shoulder external rotation 4 4   Goal status: INITIAL  3.  Pt will improve all cervical AROM to [facility] to improve functional mobility.  Baseline:  06/24/23:  Active ROM A/PROM (deg) eval  Flexion 45 deg  Extension 10 deg  Right lateral flexion 40 deg  Left lateral flexion 35 deg*  Right rotation 40 deg  Left rotation 25 deg*   Goal status: INITIAL   PLAN:  PT FREQUENCY: 1x/week  PT DURATION: 8 weeks  PLANNED INTERVENTIONS: Therapeutic exercises, Therapeutic activity, Neuromuscular re-education, Balance training, Gait training, Patient/Family education, Self Care, Joint mobilization, Spinal manipulation, Spinal mobilization, Cryotherapy, Moist heat, Manual therapy, and Re-evaluation  PLAN FOR NEXT SESSION: Progress cervico-thoracic strength and ROM exercises incorporate functional balance exercises.    Lovie Macadamia, SPT  [name]. Fairly IV, PT, DPT Physical Therapist- [facility]  [facility] 07/09/23, 8:28 AM

## 2023-07-14 ENCOUNTER — Ambulatory Visit: Payer: Medicare Other

## 2023-07-14 DIAGNOSIS — M6281 Muscle weakness (generalized): Secondary | ICD-10-CM

## 2023-07-14 DIAGNOSIS — M542 Cervicalgia: Secondary | ICD-10-CM

## 2023-07-14 NOTE — Therapy (Addendum)
OUTPATIENT PHYSICAL THERAPY CERVICAL TREATMENT   Patient Name: Whitney Mclean MRN: [id number] DOB:[birth year], 57 y.o., female Today's Date: 07/14/2023  END OF SESSION:  PT End of Session - 07/14/23 1258     Visit Number 4    Number of Visits 9    Date for PT Re-Evaluation 08/19/23    Authorization - Number of Visits 30    PT Start Time 1258    PT Stop Time 1342    PT Time Calculation (min) 44 min    Activity Tolerance Patient tolerated treatment well    Behavior During Therapy WFL for tasks assessed/performed             Past Medical History:  Diagnosis Date   Oral cancer (HCC)    Personal history of chemotherapy    Personal history of radiation therapy    Scleroderma (HCC)    Past Surgical History:  Procedure Laterality Date   ABDOMINAL HYSTERECTOMY     Patient Active Problem List   Diagnosis Date Noted   Other secondary kyphosis, cervicothoracic region 05/19/2023   Compression fracture of T3 vertebra (HCC) 05/19/2023   Compression fracture of C7 vertebra (HCC) 05/19/2023   Osteoporosis 05/19/2023    PCP: Danella Penton, MD  REFERRING PROVIDER: Danella Penton, MD   REFERRING DIAG: S12.600A (ICD-10-CM) - Unspecified displaced fracture of seventh cervical vertebra, initial encounter for closed fracture  THERAPY DIAG:  Cervicalgia  Muscle weakness (generalized)  Rationale for Evaluation and Treatment: Rehabilitation  ONSET DATE: July 2024  SUBJECTIVE:                                                                                                                                                                                                         SUBJECTIVE STATEMENT: Pt reports no pain at today's session. No notable changes over the weekend.   PERTINENT HISTORY:  Pt presents to PT w/ chronic limited cervical mobility 2/2 severe osteoporosis, scleroderma, and hx of cervical fibrosis of the neck. Since previous bout of PT patient had a fall in the  restroom resulting in three fx vertebrae, fx of six ribs, and a fx of her R shoulder blade. Pt's current fx are being managed non-surgically. Pt reports no pain or sx of N/T. She states she has the most difficulty with maintaining a seated position with no back support and is only able to maintain this position for 2-3 minutes before requiring back support. Pt also reports an increased fear of falling.   PAIN:  Are you having pain? No Main aggravation cannot  sit up without back support, increased fear of falling.  Easing factors: having a supported back 2-3 minutes of sitting unsupported before needing support  PRECAUTIONS: Fall  RED FLAGS: None     WEIGHT BEARING RESTRICTIONS: No  FALLS:  Has patient fallen in last 6 months? Yes. Number of falls 1 and near falls  LIVING ENVIRONMENT: Has following equipment at home: Walker - 2 wheeled *Pt does not use RW often  OCCUPATION: Retired, after scleroderma dx  PLOF: Independent  PATIENT GOALS: To get moving again   NEXT MD VISIT: N/A  OBJECTIVE:  Note: Objective measures were completed at Evaluation unless otherwise noted.  DIAGNOSTIC FINDINGS:  CLINICAL DATA:  The patient suffered cervical and thoracic spine compression fractures in a fall 03/25/2023. Subsequent encounter.   EXAM: MRI CERVICAL SPINE WITHOUT CONTRAST   TECHNIQUE: Multiplanar, multisequence MR imaging of the cervical spine was performed. No intravenous contrast was administered.   COMPARISON:  CT chest 04/16/2023.   FINDINGS: Alignment: No listhesis. Mild kyphosis is centered about the T1 level.   Vertebrae: The patient has a superior endplate compression fracture of C7 with vertebral body height loss of approximately 30%, a biconcave compression fracture of T1 vertebral body height loss of approximately 50% and a biconcave compression fracture T3 with vertebral body height loss centrally of approximately 80%. There is marrow edema within each of the  vertebral bodies consistent with acute or early subacute injuries. Marrow edema is also seen in the right fifth and sixth ribs due to the fractures seen on the prior CT.   Cord: Normal signal throughout the visualized cord.   Posterior Fossa, vertebral arteries, paraspinal tissues: Negative.   IMPRESSION: 1. Acute or early subacute compression fractures of C7, T1 and T3 as described above. No retropulsion off the superior endplates of C7 or T1. The central spinal canal and neural foramina are open at all levels. 2. Marrow edema in the right fifth and sixth ribs due to the fractures seen on the prior CT.  PATIENT SURVEYS:  FOTO 48/57  COGNITION: Overall cognitive status: Within functional limits for tasks assessed  SENSATION: WFL  POSTURE: rounded shoulders and forward head  PALPATION: No TTP noted    CERVICAL ROM:   Active ROM A/PROM (deg) eval  Flexion 45 deg  Extension 10 deg  Right lateral flexion 40 deg  Left lateral flexion 35 deg*  Right rotation 40 deg  Left rotation 25 deg*   (Blank rows = not tested)  All cervical PROM= AROM   UPPER EXTREMITY ROM:  Active ROM Right eval Left eval  Shoulder flexion    Shoulder extension    Shoulder abduction 100 deg   Shoulder adduction    Shoulder extension    Shoulder internal rotation C7 C7  Shoulder external rotation Mid- thoracic Mid- thoracic  Elbow flexion    Elbow extension    Wrist flexion    Wrist extension    Wrist ulnar deviation    Wrist radial deviation    Wrist pronation    Wrist supination     (Blank rows = not tested)  UPPER EXTREMITY MMT:  MMT Right eval Left eval  Shoulder flexion 4 4-  Shoulder extension    Shoulder abduction 4+ 4+  Shoulder adduction    Shoulder extension    Shoulder internal rotation 4 4  Shoulder external rotation 4 4  Middle trapezius    Lower trapezius    Elbow flexion 4+ 4+  Elbow extension 4+ 4+  Wrist  flexion    Wrist extension    Wrist ulnar  deviation    Wrist radial deviation    Wrist pronation    Wrist supination    Grip strength 4 4   (Blank rows = not tested)  CERVICAL SPECIAL TESTS:  Deferred 2/2 to limited cervical mobility  FUNCTIONAL TESTS:  DNF Endurance Test: Deferred to next session   SLS assessment RLE- 16 seconds LLE- 27 seconds w/ bilat UE support 07/08/23  TODAY'S TREATMENT: DATE: 07/14/23  UBE x2 minutes forward seat #5 lvl 3.0, x2 minutes backwards minutes lvl 2.0 seat #4 Seated thoracic extension over half foam roller x10  Standing wall angels 2 x10 w 1# DB  Seated shoulder rows w/ GTB 2 x12  Shoulder Horizontal Abduction w/ RTB 2 x12 (theraband tied around pt's distal bilat wrists for pt comfort)  Seated bicep curls w/ 2# DB's in bilat UE's 2 x12  Tandem walking on airex pad 4 x10' down and back Standing tandem stance w/ intermittent UE support RLE/LLE 2 x 30sec/ each side Reciprocal obstacle clearance over 6" obstacle (4) 4 x 10' down and back  PATIENT EDUCATION:  Education details: HEP given to pt Person educated: Patient Education method: Explanation and Demonstration Education comprehension: verbalized understanding and returned demonstration  HOME EXERCISE PROGRAM: Access Code: [email or web address] URL: [email or web address] Date: 06/24/2023 Prepared by: Tomasa Hose  Exercises - Supine Chin Tuck  - 1 x daily - 4-5 x weekly - 2 sets - 10 reps - Cervical Extension AROM with Strap  - 1 x daily - 4-5 x weekly - 2 sets - 10 reps - Seated Levator Scapulae Stretch  - 1 x daily - 4-5 x weekly - 1 sets - 2 reps - 30 hold - Seated scapular retraction   - 1 x daily - 4-5 x weekly - 2 sets - 10 reps  ASSESSMENT:  CLINICAL IMPRESSION: Session focused on progressing cervico-thoracic mobility and balance exercises. Pt notes progression with her balance displayed with improved activity tolerance to RLE/LLE tandem stance and tandem walking activities. Pt continues to note weakness in  cervico-thoracic/ shoulder strength displayed with increased muscular fatigue following exercises. Future sessions will continue to focus on cervico-thoracic mobility and shoulder strengthening to improve functional ADL ability. Pt would continue to benefit from skilled PT interventions to address remaining cervico-thoracic ROM, strength and balance deficits  OBJECTIVE IMPAIRMENTS: decreased mobility, decreased ROM, decreased strength, hypomobility, and impaired flexibility.   ACTIVITY LIMITATIONS: carrying and lifting  PARTICIPATION LIMITATIONS: community activity  PERSONAL FACTORS: Age, Past/current experiences, Time since onset of injury/illness/exacerbation, and 3+ comorbidities: scleroderma, osteoporosis, hx of cancer  are also affecting patient's functional outcome.   REHAB POTENTIAL: Fair multi- comorbidities  CLINICAL DECISION MAKING: Evolving/moderate complexity  EVALUATION COMPLEXITY: Moderate   GOALS: Goals reviewed with patient? Yes  SHORT TERM GOALS: Target date: 07/22/23  Pt will be independent with HEP to improve cervical and shoulder strength with functional activities   Baseline: 06/24/23: HEP given to pt Goal status: INITIAL   LONG TERM GOALS: Target date: 08/19/23  Pt will improve FOTO to target score to demonstrate clinically significant improvement in functional mobility.   Baseline: 06/24/23: 48/57 Goal status: INITIAL  2.  Pt will improve all L shoulder AROM 4+ to improve functional mobility of bilat UE's.  Baseline: 06/24/23:  MMT Right eval Left eval  Shoulder flexion 4 4-  Shoulder extension    Shoulder abduction 4+ 4+  Shoulder adduction    Shoulder extension    Shoulder  internal rotation 4 4  Shoulder external rotation 4 4   Goal status: INITIAL  3.  Pt will improve all cervical AROM to [facility] to improve functional mobility.  Baseline: 06/24/23:  Active ROM A/PROM (deg) eval  Flexion 45 deg  Extension 10 deg  Right lateral flexion 40 deg   Left lateral flexion 35 deg*  Right rotation 40 deg  Left rotation 25 deg*   Goal status: INITIAL   PLAN:  PT FREQUENCY: 1x/week  PT DURATION: 8 weeks  PLANNED INTERVENTIONS: Therapeutic exercises, Therapeutic activity, Neuromuscular re-education, Balance training, Gait training, Patient/Family education, Self Care, Joint mobilization, Spinal manipulation, Spinal mobilization, Cryotherapy, Moist heat, Manual therapy, and Re-evaluation  PLAN FOR NEXT SESSION: Progress cervico-thoracic strength, progress balance activities.    Lovie Macadamia, SPT  [name]. Fairly IV, PT, DPT Physical Therapist- [facility] 07/14/23, 2:23 PM

## 2023-07-22 ENCOUNTER — Ambulatory Visit: Payer: Medicare Other

## 2023-07-29 ENCOUNTER — Ambulatory Visit: Payer: Medicare Other | Attending: Internal Medicine

## 2023-07-29 DIAGNOSIS — M6281 Muscle weakness (generalized): Secondary | ICD-10-CM | POA: Insufficient documentation

## 2023-07-29 DIAGNOSIS — M542 Cervicalgia: Secondary | ICD-10-CM | POA: Diagnosis present

## 2023-07-29 NOTE — Therapy (Addendum)
OUTPATIENT PHYSICAL THERAPY CERVICAL TREATMENT   Patient Name: Whitney Mclean MRN: [id number] DOB:10-19-1965, 57 y.o., female Today's Date: 07/29/2023  END OF SESSION:  PT End of Session - 07/29/23 1343     Visit Number 5    Number of Visits 9    Date for PT Re-Evaluation 08/19/23    Authorization - Number of Visits 30    PT Start Time 1344    PT Stop Time 1428    PT Time Calculation (min) 44 min    Activity Tolerance Patient tolerated treatment well    Behavior During Therapy WFL for tasks assessed/performed             Past Medical History:  Diagnosis Date   Oral cancer (HCC)    Personal history of chemotherapy    Personal history of radiation therapy    Scleroderma (HCC)    Past Surgical History:  Procedure Laterality Date   ABDOMINAL HYSTERECTOMY     Patient Active Problem List   Diagnosis Date Noted   Other secondary kyphosis, cervicothoracic region 05/19/2023   Compression fracture of T3 vertebra (HCC) 05/19/2023   Compression fracture of C7 vertebra (HCC) 05/19/2023   Osteoporosis 05/19/2023    PCP: Danella Penton, MD  REFERRING PROVIDER: Danella Penton, MD   REFERRING DIAG: S12.600A (ICD-10-CM) - Unspecified displaced fracture of seventh cervical vertebra, initial encounter for closed fracture  THERAPY DIAG:  Cervicalgia  Muscle weakness (generalized)  Rationale for Evaluation and Treatment: Rehabilitation  ONSET DATE: July 2024  SUBJECTIVE:                                                                                                                                                                                                         SUBJECTIVE STATEMENT: Pt does not report any pain today. She reports she has been sick over the past couple of days but has fully recovered.    PERTINENT HISTORY:  Pt presents to PT w/ chronic limited cervical mobility 2/2 severe osteoporosis, scleroderma, and hx of cervical fibrosis of the neck. Since  previous bout of PT patient had a fall in the restroom resulting in three fx vertebrae, fx of six ribs, and a fx of her R shoulder blade. Pt's current fx are being managed non-surgically. Pt reports no pain or sx of N/T. She states she has the most difficulty with maintaining a seated position with no back support and is only able to maintain this position for 2-3 minutes before requiring back support. Pt also reports an increased fear of falling.  PAIN:  Are you having pain? No Main aggravation cannot sit up without back support, increased fear of falling.  Easing factors: having a supported back 2-3 minutes of sitting unsupported before needing support  PRECAUTIONS: Fall  RED FLAGS: None     WEIGHT BEARING RESTRICTIONS: No  FALLS:  Has patient fallen in last 6 months? Yes. Number of falls 1 and near falls  LIVING ENVIRONMENT: Has following equipment at home: Walker - 2 wheeled *Pt does not use RW often  OCCUPATION: Retired, after scleroderma dx  PLOF: Independent  PATIENT GOALS: To get moving again   NEXT MD VISIT: N/A  OBJECTIVE:  Note: Objective measures were completed at Evaluation unless otherwise noted.  DIAGNOSTIC FINDINGS:  CLINICAL DATA:  The patient suffered cervical and thoracic spine compression fractures in a fall 03/25/2023. Subsequent encounter.   EXAM: MRI CERVICAL SPINE WITHOUT CONTRAST   TECHNIQUE: Multiplanar, multisequence MR imaging of the cervical spine was performed. No intravenous contrast was administered.   COMPARISON:  CT chest 04/16/2023.   FINDINGS: Alignment: No listhesis. Mild kyphosis is centered about the T1 level.   Vertebrae: The patient has a superior endplate compression fracture of C7 with vertebral body height loss of approximately 30%, a biconcave compression fracture of T1 vertebral body height loss of approximately 50% and a biconcave compression fracture T3 with vertebral body height loss centrally of approximately  80%. There is marrow edema within each of the vertebral bodies consistent with acute or early subacute injuries. Marrow edema is also seen in the right fifth and sixth ribs due to the fractures seen on the prior CT.   Cord: Normal signal throughout the visualized cord.   Posterior Fossa, vertebral arteries, paraspinal tissues: Negative.   IMPRESSION: 1. Acute or early subacute compression fractures of C7, T1 and T3 as described above. No retropulsion off the superior endplates of C7 or T1. The central spinal canal and neural foramina are open at all levels. 2. Marrow edema in the right fifth and sixth ribs due to the fractures seen on the prior CT.  PATIENT SURVEYS:  FOTO 48/57  COGNITION: Overall cognitive status: Within functional limits for tasks assessed  SENSATION: WFL  POSTURE: rounded shoulders and forward head  PALPATION: No TTP noted    CERVICAL ROM:   Active ROM A/PROM (deg) eval  Flexion 45 deg  Extension 10 deg  Right lateral flexion 40 deg  Left lateral flexion 35 deg*  Right rotation 40 deg  Left rotation 25 deg*   (Blank rows = not tested)  All cervical PROM= AROM   UPPER EXTREMITY ROM:  Active ROM Right eval Left eval  Shoulder flexion    Shoulder extension    Shoulder abduction 100 deg   Shoulder adduction    Shoulder extension    Shoulder internal rotation C7 C7  Shoulder external rotation Mid- thoracic Mid- thoracic  Elbow flexion    Elbow extension    Wrist flexion    Wrist extension    Wrist ulnar deviation    Wrist radial deviation    Wrist pronation    Wrist supination     (Blank rows = not tested)  UPPER EXTREMITY MMT:  MMT Right eval Left eval  Shoulder flexion 4 4-  Shoulder extension    Shoulder abduction 4+ 4+  Shoulder adduction    Shoulder extension    Shoulder internal rotation 4 4  Shoulder external rotation 4 4  Middle trapezius    Lower trapezius    Elbow  flexion 4+ 4+  Elbow extension 4+ 4+  Wrist  flexion    Wrist extension    Wrist ulnar deviation    Wrist radial deviation    Wrist pronation    Wrist supination    Grip strength 4 4   (Blank rows = not tested)  CERVICAL SPECIAL TESTS:  Deferred 2/2 to limited cervical mobility  FUNCTIONAL TESTS:  DNF Endurance Test: Deferred to next session   SLS assessment RLE- 16 seconds LLE- 27 seconds w/ bilat UE support 07/08/23  TODAY'S TREATMENT: DATE: 07/29/23  UBE x2 minutes forward seat #5 lvl 3.0, x2 minutes backwards minutes lvl 2.0 seat #4 Seated horizontal abduction w/ GTB 2 x10  Seated bilat shoulder flexion w/ 2# AW on dowel 2 x10  Seated bicep curls w/ 3# RUE/LUE 3 x8 Seated shoulder rows w/ Blue TB 3 x12 Seated "Y's" with 1# DB in bilat Ue's 2x8  Quadruped Cat and Cows 2 x8 (w/ bilat Ue's placed on dumbbells for pt comfort at wrist) Seated overhead bilat tricep ext with 2KG medicine ball x4 (d/c 2/2 pt's tolerance).   PATIENT EDUCATION:  Education details: HEP given to pt Person educated: Patient Education method: Medical illustrator Education comprehension: verbalized understanding and returned demonstration  HOME EXERCISE PROGRAM: Access Code: [email or web address] URL: [email or web address] Date: 06/24/2023 Prepared by: Tomasa Hose  Exercises - Supine Chin Tuck  - 1 x daily - 4-5 x weekly - 2 sets - 10 reps - Cervical Extension AROM with Strap  - 1 x daily - 4-5 x weekly - 2 sets - 10 reps - Seated Levator Scapulae Stretch  - 1 x daily - 4-5 x weekly - 1 sets - 2 reps - 30 hold - Seated scapular retraction   - 1 x daily - 4-5 x weekly - 2 sets - 10 reps  ASSESSMENT:  CLINICAL IMPRESSION:        Session focused on progression of bilat shoulder strength and cervico-thoracic mobility exercises. Pt notes improvements in L shoulder strength, demonstrated by enhanced activity tolerance to closed chained exercise progressions, including quadruped activities. Pt continues to experience deficits with  bilat shoulder overhead movements, leading to discontinuing of overhead triceps extension exercises. Pt reports some discomfort with overhead motions, limiting full functional return. Future sessions will continue to emphasize overhead movements and further strengthen shoulder stability to improve ROM and function. Pt would continue to benefit from skilled PT interventions to address remaining cervico-thoracic ROM, strength and balance deficits  OBJECTIVE IMPAIRMENTS: decreased mobility, decreased ROM, decreased strength, hypomobility, and impaired flexibility.   ACTIVITY LIMITATIONS: carrying and lifting  PARTICIPATION LIMITATIONS: community activity  PERSONAL FACTORS: Age, Past/current experiences, Time since onset of injury/illness/exacerbation, and 3+ comorbidities: scleroderma, osteoporosis, hx of cancer  are also affecting patient's functional outcome.   REHAB POTENTIAL: Fair multi- comorbidities  CLINICAL DECISION MAKING: Evolving/moderate complexity  EVALUATION COMPLEXITY: Moderate   GOALS: Goals reviewed with patient? Yes  SHORT TERM GOALS: Target date: 07/22/23  Pt will be independent with HEP to improve cervical and shoulder strength with functional activities   Baseline: 06/24/23: HEP given to pt Goal status: INITIAL   LONG TERM GOALS: Target date: 08/19/23  Pt will improve FOTO to target score to demonstrate clinically significant improvement in functional mobility.   Baseline: 06/24/23: 48/57 Goal status: INITIAL  2.  Pt will improve all L shoulder AROM 4+ to improve functional mobility of bilat UE's.  Baseline: 06/24/23:  MMT Right eval Left eval  Shoulder flexion 4 4-  Shoulder extension    Shoulder abduction 4+ 4+  Shoulder adduction    Shoulder extension    Shoulder internal rotation 4 4  Shoulder external rotation 4 4   Goal status: INITIAL  3.  Pt will improve all cervical AROM to [facility] to improve functional mobility.  Baseline: 06/24/23:  Active ROM  A/PROM (deg) eval  Flexion 45 deg  Extension 10 deg  Right lateral flexion 40 deg  Left lateral flexion 35 deg*  Right rotation 40 deg  Left rotation 25 deg*   Goal status: INITIAL   PLAN:  PT FREQUENCY: 1x/week  PT DURATION: 8 weeks  PLANNED INTERVENTIONS: Therapeutic exercises, Therapeutic activity, Neuromuscular re-education, Balance training, Gait training, Patient/Family education, Self Care, Joint mobilization, Spinal manipulation, Spinal mobilization, Cryotherapy, Moist heat, Manual therapy, and Re-evaluation  PLAN FOR NEXT SESSION: Emphasize balance activities, Progress cervico-thoracic strength/ shoulder strengthening   Lovie Macadamia, SPT  [name]. Fairly IV, PT, DPT Physical Therapist- [facility] 07/29/23, 3:07 PM

## 2023-08-12 ENCOUNTER — Ambulatory Visit: Payer: Medicare Other

## 2023-08-12 DIAGNOSIS — M6281 Muscle weakness (generalized): Secondary | ICD-10-CM

## 2023-08-12 DIAGNOSIS — M542 Cervicalgia: Secondary | ICD-10-CM | POA: Diagnosis not present

## 2023-08-12 NOTE — Therapy (Signed)
OUTPATIENT PHYSICAL THERAPY CERVICAL TREATMENT   Patient Name: Whitney Mclean MRN: [id number] DOB:1966/04/13, 57 y.o., female Today's Date: 08/12/2023  END OF SESSION:  PT End of Session - 08/12/23 1350     Visit Number 6    Number of Visits 9    Date for PT Re-Evaluation 08/19/23    Authorization - Number of Visits 30    PT Start Time 1347    PT Stop Time 1430    PT Time Calculation (min) 43 min    Activity Tolerance Patient tolerated treatment well    Behavior During Therapy WFL for tasks assessed/performed             Past Medical History:  Diagnosis Date   Oral cancer (HCC)    Personal history of chemotherapy    Personal history of radiation therapy    Scleroderma (HCC)    Past Surgical History:  Procedure Laterality Date   ABDOMINAL HYSTERECTOMY     Patient Active Problem List   Diagnosis Date Noted   Other secondary kyphosis, cervicothoracic region 05/19/2023   Compression fracture of T3 vertebra (HCC) 05/19/2023   Compression fracture of C7 vertebra (HCC) 05/19/2023   Osteoporosis 05/19/2023    PCP: Danella Penton, MD  REFERRING PROVIDER: Danella Penton, MD   REFERRING DIAG: S12.600A (ICD-10-CM) - Unspecified displaced fracture of seventh cervical vertebra, initial encounter for closed fracture  THERAPY DIAG:  Cervicalgia  Muscle weakness (generalized)  Rationale for Evaluation and Treatment: Rehabilitation  ONSET DATE: July 2024  SUBJECTIVE:                                                                                                                                                                                                         SUBJECTIVE STATEMENT: Pt does not report any pain today. Denies falls. Reports fatigue from other ongoing issues.  PERTINENT HISTORY:  Pt presents to PT w/ chronic limited cervical mobility 2/2 severe osteoporosis, scleroderma, and hx of cervical fibrosis of the neck. Since previous bout of PT patient had  a fall in the restroom resulting in three fx vertebrae, fx of six ribs, and a fx of her R shoulder blade. Pt's current fx are being managed non-surgically. Pt reports no pain or sx of N/T. She states she has the most difficulty with maintaining a seated position with no back support and is only able to maintain this position for 2-3 minutes before requiring back support. Pt also reports an increased fear of falling.   PAIN:  Are you having pain? No Main aggravation  cannot sit up without back support, increased fear of falling.  Easing factors: having a supported back 2-3 minutes of sitting unsupported before needing support  PRECAUTIONS: Fall  RED FLAGS: None     WEIGHT BEARING RESTRICTIONS: No  FALLS:  Has patient fallen in last 6 months? Yes. Number of falls 1 and near falls  LIVING ENVIRONMENT: Has following equipment at home: Walker - 2 wheeled *Pt does not use RW often  OCCUPATION: Retired, after scleroderma dx  PLOF: Independent  PATIENT GOALS: To get moving again   NEXT MD VISIT: N/A  OBJECTIVE:  Note: Objective measures were completed at Evaluation unless otherwise noted.  DIAGNOSTIC FINDINGS:  CLINICAL DATA:  The patient suffered cervical and thoracic spine compression fractures in a fall 03/25/2023. Subsequent encounter.   EXAM: MRI CERVICAL SPINE WITHOUT CONTRAST   TECHNIQUE: Multiplanar, multisequence MR imaging of the cervical spine was performed. No intravenous contrast was administered.   COMPARISON:  CT chest 04/16/2023.   FINDINGS: Alignment: No listhesis. Mild kyphosis is centered about the T1 level.   Vertebrae: The patient has a superior endplate compression fracture of C7 with vertebral body height loss of approximately 30%, a biconcave compression fracture of T1 vertebral body height loss of approximately 50% and a biconcave compression fracture T3 with vertebral body height loss centrally of approximately 80%. There is marrow edema within  each of the vertebral bodies consistent with acute or early subacute injuries. Marrow edema is also seen in the right fifth and sixth ribs due to the fractures seen on the prior CT.   Cord: Normal signal throughout the visualized cord.   Posterior Fossa, vertebral arteries, paraspinal tissues: Negative.   IMPRESSION: 1. Acute or early subacute compression fractures of C7, T1 and T3 as described above. No retropulsion off the superior endplates of C7 or T1. The central spinal canal and neural foramina are open at all levels. 2. Marrow edema in the right fifth and sixth ribs due to the fractures seen on the prior CT.  PATIENT SURVEYS:  FOTO 48/57  COGNITION: Overall cognitive status: Within functional limits for tasks assessed  SENSATION: WFL  POSTURE: rounded shoulders and forward head  PALPATION: No TTP noted    CERVICAL ROM:   Active ROM A/PROM (deg) eval  Flexion 45 deg  Extension 10 deg  Right lateral flexion 40 deg  Left lateral flexion 35 deg*  Right rotation 40 deg  Left rotation 25 deg*   (Blank rows = not tested)  All cervical PROM= AROM   UPPER EXTREMITY ROM:  Active ROM Right eval Left eval  Shoulder flexion    Shoulder extension    Shoulder abduction 100 deg   Shoulder adduction    Shoulder extension    Shoulder internal rotation C7 C7  Shoulder external rotation Mid- thoracic Mid- thoracic  Elbow flexion    Elbow extension    Wrist flexion    Wrist extension    Wrist ulnar deviation    Wrist radial deviation    Wrist pronation    Wrist supination     (Blank rows = not tested)  UPPER EXTREMITY MMT:  MMT Right eval Left eval  Shoulder flexion 4 4-  Shoulder extension    Shoulder abduction 4+ 4+  Shoulder adduction    Shoulder extension    Shoulder internal rotation 4 4  Shoulder external rotation 4 4  Middle trapezius    Lower trapezius    Elbow flexion 4+ 4+  Elbow extension 4+ 4+  Wrist flexion    Wrist extension     Wrist ulnar deviation    Wrist radial deviation    Wrist pronation    Wrist supination    Grip strength 4 4   (Blank rows = not tested)  CERVICAL SPECIAL TESTS:  Deferred 2/2 to limited cervical mobility  FUNCTIONAL TESTS:  DNF Endurance Test: Deferred to next session   SLS assessment RLE- 16 seconds LLE- 27 seconds w/ bilat UE support 07/08/23  TODAY'S TREATMENT: DATE: 08/12/23  There.ex: UBE x2 minutes forward seat #5 lvl 3.0, x2 minutes backwards Seated horizontal abduction w/ GTB 3x8  Seated bilat shoulder flexion w/ 2# AW on dowel 2x12 Seated "Y's" with 1# DB in bilat UE's 2x8   Neuro Re-Ed: Tandem: 3x30 sec/LE. No UE support, CGA SLS: 3x30 sec/LE. No UE support, CGA  Provided on HEP. Educated on reps/sets/frequency  PATIENT EDUCATION:  Education details: HEP given to pt Person educated: Patient Education method: Medical illustrator Education comprehension: verbalized understanding and returned demonstration  HOME EXERCISE PROGRAM: Access Code: [email or web address] URL: [email or web address] Date: 08/12/2023 Prepared by: Ronnie Derby  Exercises - Supine Chin Tuck  - 1 x daily - 4-5 x weekly - 2 sets - 10 reps - Cervical Extension AROM with Strap  - 1 x daily - 4-5 x weekly - 2 sets - 10 reps - Seated Levator Scapulae Stretch  - 1 x daily - 4-5 x weekly - 1 sets - 2 reps - 30 hold - Seated scapular retraction   - 1 x daily - 4-5 x weekly - 2 sets - 10 reps - Standing Tandem Balance with Counter Support  - 1 x daily - 3-4 x weekly - 1 sets - 3 reps - 30 hold - Standing Single Leg Stance with Counter Support  - 1 x daily - 3-4 x weekly - 1 sets - 3 reps - 30 hold   Access Code: [email or web address] URL: [email or web address] Date: 06/24/2023 Prepared by: Tomasa Hose  Exercises - Supine Chin Tuck  - 1 x daily - 4-5 x weekly - 2 sets - 10 reps - Cervical Extension AROM with Strap  - 1 x daily - 4-5 x weekly - 2 sets - 10 reps - Seated  Levator Scapulae Stretch  - 1 x daily - 4-5 x weekly - 1 sets - 2 reps - 30 hold - Seated scapular retraction   - 1 x daily - 4-5 x weekly - 2 sets - 10 reps  ASSESSMENT:  CLINICAL IMPRESSION:       Continuing PT POC with focus on cervico-thoracic mobility and periscapular strengthening. Pt tolerated session well but required regular seated rest breaks due to poor core strength/endurance. Introduction of balance exercises to HEP to improve balance and reduce falls risk. Pt will require recert or d/c next visit to assess progress towards goals. Pt would continue to benefit from skilled PT interventions to address remaining cervico-thoracic ROM, strength and balance deficits.  OBJECTIVE IMPAIRMENTS: decreased mobility, decreased ROM, decreased strength, hypomobility, and impaired flexibility.   ACTIVITY LIMITATIONS: carrying and lifting  PARTICIPATION LIMITATIONS: community activity  PERSONAL FACTORS: Age, Past/current experiences, Time since onset of injury/illness/exacerbation, and 3+ comorbidities: scleroderma, osteoporosis, hx of cancer  are also affecting patient's functional outcome.   REHAB POTENTIAL: Fair multi- comorbidities  CLINICAL DECISION MAKING: Evolving/moderate complexity  EVALUATION COMPLEXITY: Moderate   GOALS: Goals reviewed with patient? Yes  SHORT TERM GOALS: Target date: 07/22/23  Pt will be independent with HEP to improve cervical and  shoulder strength with functional activities   Baseline: 06/24/23: HEP given to pt Goal status: INITIAL   LONG TERM GOALS: Target date: 08/19/23  Pt will improve FOTO to target score to demonstrate clinically significant improvement in functional mobility.   Baseline: 06/24/23: 48/57 Goal status: INITIAL  2.  Pt will improve all L shoulder AROM 4+ to improve functional mobility of bilat UE's.  Baseline: 06/24/23:  MMT Right eval Left eval  Shoulder flexion 4 4-  Shoulder extension    Shoulder abduction 4+ 4+  Shoulder  adduction    Shoulder extension    Shoulder internal rotation 4 4  Shoulder external rotation 4 4   Goal status: INITIAL  3.  Pt will improve all cervical AROM to [facility] to improve functional mobility.  Baseline: 06/24/23:  Active ROM A/PROM (deg) eval  Flexion 45 deg  Extension 10 deg  Right lateral flexion 40 deg  Left lateral flexion 35 deg*  Right rotation 40 deg  Left rotation 25 deg*   Goal status: INITIAL   PLAN:  PT FREQUENCY: 1x/week  PT DURATION: 8 weeks  PLANNED INTERVENTIONS: Therapeutic exercises, Therapeutic activity, Neuromuscular re-education, Balance training, Gait training, Patient/Family education, Self Care, Joint mobilization, Spinal manipulation, Spinal mobilization, Cryotherapy, Moist heat, Manual therapy, and Re-evaluation  PLAN FOR NEXT SESSION: Emphasize balance activities, Progress cervico-thoracic strength/ shoulder strengthening    [name]. Fairly IV, PT, DPT Physical Therapist- [place] 08/12/23, 3:20 PM

## 2023-08-19 ENCOUNTER — Ambulatory Visit: Payer: Medicare Other

## 2023-08-26 ENCOUNTER — Ambulatory Visit: Payer: Medicare Other

## 2023-09-02 ENCOUNTER — Ambulatory Visit: Payer: Medicare Other | Attending: Internal Medicine

## 2023-09-02 DIAGNOSIS — M542 Cervicalgia: Secondary | ICD-10-CM | POA: Insufficient documentation

## 2023-09-02 DIAGNOSIS — M6281 Muscle weakness (generalized): Secondary | ICD-10-CM | POA: Insufficient documentation

## 2023-09-02 DIAGNOSIS — M25611 Stiffness of right shoulder, not elsewhere classified: Secondary | ICD-10-CM | POA: Diagnosis present

## 2023-09-02 NOTE — Therapy (Signed)
OUTPATIENT PHYSICAL THERAPY CERVICAL TREATMENT   Patient Name: Whitney Mclean MRN: [id number] DOB:1966/04/07, 57 y.o., female Today's Date: 09/02/2023  END OF SESSION:  PT End of Session - 09/02/23 1345     Visit Number 7    Number of Visits 15    Date for PT Re-Evaluation 10/28/23    Authorization - Number of Visits 30    PT Start Time 1345    PT Stop Time 1430    PT Time Calculation (min) 45 min    Activity Tolerance Patient tolerated treatment well    Behavior During Therapy [facility] for tasks assessed/performed             Past Medical History:  Diagnosis Date   Oral cancer (HCC)    Personal history of chemotherapy    Personal history of radiation therapy    Scleroderma (HCC)    Past Surgical History:  Procedure Laterality Date   ABDOMINAL HYSTERECTOMY     Patient Active Problem List   Diagnosis Date Noted   Other secondary kyphosis, cervicothoracic region 05/19/2023   Compression fracture of T3 vertebra (HCC) 05/19/2023   Compression fracture of C7 vertebra (HCC) 05/19/2023   Osteoporosis 05/19/2023    PCP: Danella Penton, MD  REFERRING PROVIDER: Danella Penton, MD   REFERRING DIAG: S12.600A (ICD-10-CM) - Unspecified displaced fracture of seventh cervical vertebra, initial encounter for closed fracture  THERAPY DIAG:  Cervicalgia  Muscle weakness (generalized)  Rationale for Evaluation and Treatment: Rehabilitation  ONSET DATE: July 2024  SUBJECTIVE:                                                                                                                                                                                                         SUBJECTIVE STATEMENT: Pt reports missing a lot of PT battling other body infections and stomach bugs. Denies falls. Balance has not been the best but has not had falls.  PERTINENT HISTORY:  Pt presents to PT w/ chronic limited cervical mobility 2/2 severe osteoporosis, scleroderma, and hx of cervical  fibrosis of the neck. Since previous bout of PT patient had a fall in the restroom resulting in three fx vertebrae, fx of six ribs, and a fx of her R shoulder blade. Pt's current fx are being managed non-surgically. Pt reports no pain or sx of N/T. She states she has the most difficulty with maintaining a seated position with no back support and is only able to maintain this position for 2-3 minutes before requiring back support. Pt also reports an increased fear of  falling.   PAIN:  Are you having pain? No Main aggravation cannot sit up without back support, increased fear of falling.  Easing factors: having a supported back 2-3 minutes of sitting unsupported before needing support  PRECAUTIONS: Fall  RED FLAGS: None     WEIGHT BEARING RESTRICTIONS: No  FALLS:  Has patient fallen in last 6 months? Yes. Number of falls 1 and near falls  LIVING ENVIRONMENT: Has following equipment at home: Walker - 2 wheeled *Pt does not use RW often  OCCUPATION: Retired, after scleroderma dx  PLOF: Independent  PATIENT GOALS: To get moving again   NEXT MD VISIT: N/A  OBJECTIVE:  Note: Objective measures were completed at Evaluation unless otherwise noted.  DIAGNOSTIC FINDINGS:  CLINICAL DATA:  The patient suffered cervical and thoracic spine compression fractures in a fall 03/25/2023. Subsequent encounter.   EXAM: MRI CERVICAL SPINE WITHOUT CONTRAST   TECHNIQUE: Multiplanar, multisequence MR imaging of the cervical spine was performed. No intravenous contrast was administered.   COMPARISON:  CT chest 04/16/2023.   FINDINGS: Alignment: No listhesis. Mild kyphosis is centered about the T1 level.   Vertebrae: The patient has a superior endplate compression fracture of C7 with vertebral body height loss of approximately 30%, a biconcave compression fracture of T1 vertebral body height loss of approximately 50% and a biconcave compression fracture T3 with vertebral body height loss  centrally of approximately 80%. There is marrow edema within each of the vertebral bodies consistent with acute or early subacute injuries. Marrow edema is also seen in the right fifth and sixth ribs due to the fractures seen on the prior CT.   Cord: Normal signal throughout the visualized cord.   Posterior Fossa, vertebral arteries, paraspinal tissues: Negative.   IMPRESSION: 1. Acute or early subacute compression fractures of C7, T1 and T3 as described above. No retropulsion off the superior endplates of C7 or T1. The central spinal canal and neural foramina are open at all levels. 2. Marrow edema in the right fifth and sixth ribs due to the fractures seen on the prior CT.  PATIENT SURVEYS:  FOTO 48/57  COGNITION: Overall cognitive status: Within functional limits for tasks assessed  SENSATION: WFL  POSTURE: rounded shoulders and forward head  PALPATION: No TTP noted    CERVICAL ROM:   Active ROM A/PROM (deg) eval  Flexion 45 deg  Extension 10 deg  Right lateral flexion 40 deg  Left lateral flexion 35 deg*  Right rotation 40 deg  Left rotation 25 deg*   (Blank rows = not tested)  All cervical PROM= AROM   UPPER EXTREMITY ROM:  Active ROM Right eval Left eval  Shoulder flexion    Shoulder extension    Shoulder abduction 100 deg   Shoulder adduction    Shoulder extension    Shoulder internal rotation C7 C7  Shoulder external rotation Mid- thoracic Mid- thoracic  Elbow flexion    Elbow extension    Wrist flexion    Wrist extension    Wrist ulnar deviation    Wrist radial deviation    Wrist pronation    Wrist supination     (Blank rows = not tested)  UPPER EXTREMITY MMT:  MMT Right eval Left eval  Shoulder flexion 4 4-  Shoulder extension    Shoulder abduction 4+ 4+  Shoulder adduction    Shoulder extension    Shoulder internal rotation 4 4  Shoulder external rotation 4 4  Middle trapezius    Lower trapezius  Elbow flexion 4+ 4+   Elbow extension 4+ 4+  Wrist flexion    Wrist extension    Wrist ulnar deviation    Wrist radial deviation    Wrist pronation    Wrist supination    Grip strength 4 4   (Blank rows = not tested)  CERVICAL SPECIAL TESTS:  Deferred 2/2 to limited cervical mobility  FUNCTIONAL TESTS:  DNF Endurance Test: Deferred to next session   SLS assessment RLE- 16 seconds LLE- 27 seconds w/ bilat UE support 07/08/23  TODAY'S TREATMENT: DATE: 09/02/23  There.ex: Reviewing goals towards POC. See goals section and clinical impression for details.   Seated cervical SNAGS:   Extension: 2x15  Rotation R/L: x12/direction. PT demo and mod multimodal cuing with fair carryover.  Standing shoulder flexion AAROM on blue foam roller for thoracic extension: 2x12  Seated unilateral shoulder ER, R/L: 2x8. Mod multimodal cuing   PATIENT EDUCATION:  Education details: HEP given to pt Person educated: Patient Education method: Medical illustrator Education comprehension: verbalized understanding and returned demonstration  HOME EXERCISE PROGRAM: Access Code: [email or web address] URL: [email or web address] Date: 08/12/2023 Prepared by: Ronnie Derby  Exercises - Supine Chin Tuck  - 1 x daily - 4-5 x weekly - 2 sets - 10 reps - Cervical Extension AROM with Strap  - 1 x daily - 4-5 x weekly - 2 sets - 10 reps - Seated Levator Scapulae Stretch  - 1 x daily - 4-5 x weekly - 1 sets - 2 reps - 30 hold - Seated scapular retraction   - 1 x daily - 4-5 x weekly - 2 sets - 10 reps - Standing Tandem Balance with Counter Support  - 1 x daily - 3-4 x weekly - 1 sets - 3 reps - 30 hold - Standing Single Leg Stance with Counter Support  - 1 x daily - 3-4 x weekly - 1 sets - 3 reps - 30 hold   Access Code: [email or web address] URL: [email or web address] Date: 06/24/2023 Prepared by: Tomasa Hose  Exercises - Supine Chin Tuck  - 1 x daily - 4-5 x weekly - 2 sets - 10 reps - Cervical  Extension AROM with Strap  - 1 x daily - 4-5 x weekly - 2 sets - 10 reps - Seated Levator Scapulae Stretch  - 1 x daily - 4-5 x weekly - 1 sets - 2 reps - 30 hold - Seated scapular retraction   - 1 x daily - 4-5 x weekly - 2 sets - 10 reps  ASSESSMENT:  CLINICAL IMPRESSION:      Pt at end of current POC. She has made mild progress with her goals specifically with improvements in FOTO and her strength testing. Pt has had some regressions with her cervical mobility and reports limited HEP compliance. Between HEP compliance and acute health issues limiting PT attendance, this is likely the explanation to her regressions. PT to continue 1x/week for 8 weeks to address chronic cervicothoracic AROM and B shoulder strength deficits from scleroderma to prevent further functional decline and maintain independence. Pt would continue to benefit from skilled PT interventions to address remaining cervico-thoracic ROM, strength and balance deficits.  OBJECTIVE IMPAIRMENTS: decreased mobility, decreased ROM, decreased strength, hypomobility, and impaired flexibility.   ACTIVITY LIMITATIONS: carrying and lifting  PARTICIPATION LIMITATIONS: community activity  PERSONAL FACTORS: Age, Past/current experiences, Time since onset of injury/illness/exacerbation, and 3+ comorbidities: scleroderma, osteoporosis, hx of cancer  are also affecting patient's functional outcome.   REHAB POTENTIAL: Fair multi- comorbidities  CLINICAL DECISION MAKING: Evolving/moderate complexity  EVALUATION COMPLEXITY: Moderate   GOALS: Goals reviewed with patient? Yes  SHORT TERM GOALS: Target date: 09/30/23  Pt will be independent with HEP to improve cervical and shoulder strength with functional activities   Baseline: 06/24/23: HEP given to pt; 09/02/23: 25% compliance Goal status: ON GOING   LONG TERM GOALS: Target date: 10/28/23  Pt will improve FOTO to target score to demonstrate clinically significant improvement in  functional mobility.   Baseline: 06/24/23: 46/58; 09/02/23: 47/58  Goal status: ONGOING  2.  Pt will improve all R/L shoulder MMT 4+ to improve functional mobility of bilat UE's.  Baseline: 06/24/23; 09/02/23 MMT Right eval Left eval Left  09/02/23 Right 09/02/23  Shoulder flexion 4 4- 4- 4-  Shoulder extension      Shoulder abduction 4+ 4+ 5 4+  Shoulder adduction      Shoulder extension      Shoulder internal rotation 4 4 4+ 4+  Shoulder external rotation 4 4 4+ 4+   Goal status: PARTIALLY MET  3.  Pt will improve all cervical AROM to [facility] to improve functional mobility.  Baseline: 06/24/23:  Active ROM A/PROM (deg) eval A/PROM (Deg)  09/02/23  Flexion 45 deg 45  Extension 10 deg 10  Right lateral flexion 40 deg 20  Left lateral flexion 35 deg* 35  Right rotation 40 deg 35  Left rotation 25 deg* 25   Goal status: ONGOING   PLAN:  PT FREQUENCY: 1x/week  PT DURATION: 8 weeks  PLANNED INTERVENTIONS: Therapeutic exercises, Therapeutic activity, Neuromuscular re-education, Balance training, Gait training, Patient/Family education, Self Care, Joint mobilization, Spinal manipulation, Spinal mobilization, Cryotherapy, Moist heat, Manual therapy, and Re-evaluation  PLAN FOR NEXT SESSION: Emphasize balance activities, Progress cervico-thoracic strength/ shoulder strengthening    [name]. Fairly IV, PT, DPT Physical Therapist-   [facility] 09/02/23, 3:19 PM

## 2023-09-09 ENCOUNTER — Ambulatory Visit: Payer: Medicare Other

## 2023-09-09 DIAGNOSIS — M542 Cervicalgia: Secondary | ICD-10-CM | POA: Diagnosis not present

## 2023-09-09 DIAGNOSIS — M6281 Muscle weakness (generalized): Secondary | ICD-10-CM

## 2023-09-09 DIAGNOSIS — M25611 Stiffness of right shoulder, not elsewhere classified: Secondary | ICD-10-CM

## 2023-09-09 NOTE — Therapy (Signed)
OUTPATIENT PHYSICAL THERAPY CERVICAL TREATMENT   Patient Name: Whitney Mclean MRN: [id number] DOB:1966-02-09, 57 y.o., female Today's Date: 09/09/2023  END OF SESSION:  PT End of Session - 09/09/23 1347     Visit Number 8    Number of Visits 15    Date for PT Re-Evaluation 10/28/23    Authorization - Number of Visits 30    PT Start Time 1345    PT Stop Time 1430    PT Time Calculation (min) 45 min    Activity Tolerance Patient tolerated treatment well    Behavior During Therapy [facility] for tasks assessed/performed             Past Medical History:  Diagnosis Date   Oral cancer (HCC)    Personal history of chemotherapy    Personal history of radiation therapy    Scleroderma (HCC)    Past Surgical History:  Procedure Laterality Date   ABDOMINAL HYSTERECTOMY     Patient Active Problem List   Diagnosis Date Noted   Other secondary kyphosis, cervicothoracic region 05/19/2023   Compression fracture of T3 vertebra (HCC) 05/19/2023   Compression fracture of C7 vertebra (HCC) 05/19/2023   Osteoporosis 05/19/2023    PCP: Danella Penton, MD  REFERRING PROVIDER: Danella Penton, MD   REFERRING DIAG: S12.600A (ICD-10-CM) - Unspecified displaced fracture of seventh cervical vertebra, initial encounter for closed fracture  THERAPY DIAG:  Cervicalgia  Muscle weakness (generalized)  Stiffness of right shoulder, not elsewhere classified  Rationale for Evaluation and Treatment: Rehabilitation  ONSET DATE: July 2024  SUBJECTIVE:                                                                                                                                                                                                         SUBJECTIVE STATEMENT: Pt reports doing well overall. Has MD appointment tomorrow for other lingering medical issue.   PERTINENT HISTORY:  Pt presents to PT w/ chronic limited cervical mobility 2/2 severe osteoporosis, scleroderma, and hx of cervical  fibrosis of the neck. Since previous bout of PT patient had a fall in the restroom resulting in three fx vertebrae, fx of six ribs, and a fx of her R shoulder blade. Pt's current fx are being managed non-surgically. Pt reports no pain or sx of N/T. She states she has the most difficulty with maintaining a seated position with no back support and is only able to maintain this position for 2-3 minutes before requiring back support. Pt also reports an increased fear of falling.   PAIN:  Are you having pain? No Main aggravation cannot sit up without back support, increased fear of falling.  Easing factors: having a supported back 2-3 minutes of sitting unsupported before needing support  PRECAUTIONS: Fall  RED FLAGS: None     WEIGHT BEARING RESTRICTIONS: No  FALLS:  Has patient fallen in last 6 months? Yes. Number of falls 1 and near falls  LIVING ENVIRONMENT: Has following equipment at home: Walker - 2 wheeled *Pt does not use RW often  OCCUPATION: Retired, after scleroderma dx  PLOF: Independent  PATIENT GOALS: To get moving again   NEXT MD VISIT: N/A  OBJECTIVE:  Note: Objective measures were completed at Evaluation unless otherwise noted.  DIAGNOSTIC FINDINGS:  CLINICAL DATA:  The patient suffered cervical and thoracic spine compression fractures in a fall 03/25/2023. Subsequent encounter.   EXAM: MRI CERVICAL SPINE WITHOUT CONTRAST   TECHNIQUE: Multiplanar, multisequence MR imaging of the cervical spine was performed. No intravenous contrast was administered.   COMPARISON:  CT chest 04/16/2023.   FINDINGS: Alignment: No listhesis. Mild kyphosis is centered about the T1 level.   Vertebrae: The patient has a superior endplate compression fracture of C7 with vertebral body height loss of approximately 30%, a biconcave compression fracture of T1 vertebral body height loss of approximately 50% and a biconcave compression fracture T3 with vertebral body height loss  centrally of approximately 80%. There is marrow edema within each of the vertebral bodies consistent with acute or early subacute injuries. Marrow edema is also seen in the right fifth and sixth ribs due to the fractures seen on the prior CT.   Cord: Normal signal throughout the visualized cord.   Posterior Fossa, vertebral arteries, paraspinal tissues: Negative.   IMPRESSION: 1. Acute or early subacute compression fractures of C7, T1 and T3 as described above. No retropulsion off the superior endplates of C7 or T1. The central spinal canal and neural foramina are open at all levels. 2. Marrow edema in the right fifth and sixth ribs due to the fractures seen on the prior CT.  PATIENT SURVEYS:  FOTO 48/57  COGNITION: Overall cognitive status: Within functional limits for tasks assessed  SENSATION: WFL  POSTURE: rounded shoulders and forward head  PALPATION: No TTP noted    CERVICAL ROM:   Active ROM A/PROM (deg) eval  Flexion 45 deg  Extension 10 deg  Right lateral flexion 40 deg  Left lateral flexion 35 deg*  Right rotation 40 deg  Left rotation 25 deg*   (Blank rows = not tested)  All cervical PROM= AROM   UPPER EXTREMITY ROM:  Active ROM Right eval Left eval  Shoulder flexion    Shoulder extension    Shoulder abduction 100 deg   Shoulder adduction    Shoulder extension    Shoulder internal rotation C7 C7  Shoulder external rotation Mid- thoracic Mid- thoracic  Elbow flexion    Elbow extension    Wrist flexion    Wrist extension    Wrist ulnar deviation    Wrist radial deviation    Wrist pronation    Wrist supination     (Blank rows = not tested)  UPPER EXTREMITY MMT:  MMT Right eval Left eval  Shoulder flexion 4 4-  Shoulder extension    Shoulder abduction 4+ 4+  Shoulder adduction    Shoulder extension    Shoulder internal rotation 4 4  Shoulder external rotation 4 4  Middle trapezius    Lower trapezius    Elbow flexion 4+  4+   Elbow extension 4+ 4+  Wrist flexion    Wrist extension    Wrist ulnar deviation    Wrist radial deviation    Wrist pronation    Wrist supination    Grip strength 4 4   (Blank rows = not tested)  CERVICAL SPECIAL TESTS:  Deferred 2/2 to limited cervical mobility  FUNCTIONAL TESTS:  DNF Endurance Test: Deferred to next session   SLS assessment RLE- 16 seconds LLE- 27 seconds w/ bilat UE support 07/08/23  TODAY'S TREATMENT: DATE: 09/09/23  There.ex: UBE L3 2 min forward, 2 min backward for UE and thoracic warm up.   Seated cervical SNAGS:   Extension: 2x12  Rotation R/L: x12/direction. PT demo and mod multimodal cuing with fair carryover.  Standing shoulder flexion AAROM on blue foam roller for thoracic extension: 2x12, 2# AW's on wrists.   Standing wall angels: 2x12  GTB, B shoulder extension: 2x6  PATIENT EDUCATION:  Education details: HEP given to pt Person educated: Patient Education method: Medical illustrator Education comprehension: verbalized understanding and returned demonstration  HOME EXERCISE PROGRAM: Access Code: [email or web address] URL: [email or web address] Date: 08/12/2023 Prepared by: Ronnie Derby  Exercises - Supine Chin Tuck  - 1 x daily - 4-5 x weekly - 2 sets - 10 reps - Cervical Extension AROM with Strap  - 1 x daily - 4-5 x weekly - 2 sets - 10 reps - Seated Levator Scapulae Stretch  - 1 x daily - 4-5 x weekly - 1 sets - 2 reps - 30 hold - Seated scapular retraction   - 1 x daily - 4-5 x weekly - 2 sets - 10 reps - Standing Tandem Balance with Counter Support  - 1 x daily - 3-4 x weekly - 1 sets - 3 reps - 30 hold - Standing Single Leg Stance with Counter Support  - 1 x daily - 3-4 x weekly - 1 sets - 3 reps - 30 hold   Access Code: [email or web address] URL: [email or web address] Date: 06/24/2023 Prepared by: Tomasa Hose  Exercises - Supine Chin Tuck  - 1 x daily - 4-5 x weekly - 2 sets - 10 reps - Cervical  Extension AROM with Strap  - 1 x daily - 4-5 x weekly - 2 sets - 10 reps - Seated Levator Scapulae Stretch  - 1 x daily - 4-5 x weekly - 1 sets - 2 reps - 30 hold - Seated scapular retraction   - 1 x daily - 4-5 x weekly - 2 sets - 10 reps  ASSESSMENT:  CLINICAL IMPRESSION: Continuing PT POC focusing on addressing postural deficits (thoracic kyphosis and cervical flexion deficits) and periscapular strengthening. Pt remains needing regular seated rest breaks in reclined position due to core/postural weakness b/t reps/sets of exercises. Continue to encouraged regular HEP compliance to maintain as much spinal mobility and BUE mobility to maintain independence. Pt understanding.  Future sessions to address balance and core stability exercises. Pt would continue to benefit from skilled PT interventions to address remaining cervico-thoracic ROM, strength and balance deficits.  OBJECTIVE IMPAIRMENTS: decreased mobility, decreased ROM, decreased strength, hypomobility, and impaired flexibility.   ACTIVITY LIMITATIONS: carrying and lifting  PARTICIPATION LIMITATIONS: community activity  PERSONAL FACTORS: Age, Past/current experiences, Time since onset of injury/illness/exacerbation, and 3+ comorbidities: scleroderma, osteoporosis, hx of cancer  are also affecting patient's functional outcome.   REHAB POTENTIAL: Fair multi- comorbidities  CLINICAL DECISION MAKING: Evolving/moderate complexity  EVALUATION COMPLEXITY: Moderate   GOALS: Goals reviewed with patient? Yes  SHORT TERM GOALS: Target date: 09/30/23  Pt will be independent with HEP to improve cervical and shoulder strength with functional activities   Baseline: 06/24/23: HEP given to pt; 09/02/23: 25% compliance Goal status: ON GOING   LONG TERM GOALS: Target date: 10/28/23  Pt will improve FOTO to target score to demonstrate clinically significant improvement in functional mobility.   Baseline: 06/24/23: 46/58; 09/02/23: 47/58   Goal status: ONGOING  2.  Pt will improve all R/L shoulder MMT 4+ to improve functional mobility of bilat UE's.  Baseline: 06/24/23; 09/02/23 MMT Right eval Left eval Left  09/02/23 Right 09/02/23  Shoulder flexion 4 4- 4- 4-  Shoulder extension      Shoulder abduction 4+ 4+ 5 4+  Shoulder adduction      Shoulder extension      Shoulder internal rotation 4 4 4+ 4+  Shoulder external rotation 4 4 4+ 4+   Goal status: PARTIALLY MET  3.  Pt will improve all cervical AROM to [facility] to improve functional mobility.  Baseline: 06/24/23:  Active ROM A/PROM (deg) eval A/PROM (Deg)  09/02/23  Flexion 45 deg 45  Extension 10 deg 10  Right lateral flexion 40 deg 20  Left lateral flexion 35 deg* 35  Right rotation 40 deg 35  Left rotation 25 deg* 25   Goal status: ONGOING   PLAN:  PT FREQUENCY: 1x/week  PT DURATION: 8 weeks  PLANNED INTERVENTIONS: Therapeutic exercises, Therapeutic activity, Neuromuscular re-education, Balance training, Gait training, Patient/Family education, Self Care, Joint mobilization, Spinal manipulation, Spinal mobilization, Cryotherapy, Moist heat, Manual therapy, and Re-evaluation  PLAN FOR NEXT SESSION: Emphasize balance activities, Progress cervico-thoracic strength/ shoulder strengthening    [name]. Fairly IV, PT, DPT Physical Therapist- [facility] 09/09/23, 2:34 PM

## 2023-09-15 ENCOUNTER — Ambulatory Visit: Payer: Medicare Other

## 2023-09-15 DIAGNOSIS — M542 Cervicalgia: Secondary | ICD-10-CM

## 2023-09-15 DIAGNOSIS — M6281 Muscle weakness (generalized): Secondary | ICD-10-CM

## 2023-09-15 NOTE — Therapy (Signed)
OUTPATIENT PHYSICAL THERAPY CERVICAL TREATMENT   Patient Name: Whitney Mclean MRN: [id number] DOB:[birth year], 57 y.o., female Today's Date: 09/15/2023  END OF SESSION:  PT End of Session - 09/15/23 1351     Visit Number 9    Number of Visits 15    Date for PT Re-Evaluation 10/28/23    Authorization - Number of Visits 30    PT Start Time 1346    PT Stop Time 1430    PT Time Calculation (min) 44 min    Activity Tolerance Patient tolerated treatment well    Behavior During Therapy WFL for tasks assessed/performed             Past Medical History:  Diagnosis Date   Oral cancer (HCC)    Personal history of chemotherapy    Personal history of radiation therapy    Scleroderma (HCC)    Past Surgical History:  Procedure Laterality Date   ABDOMINAL HYSTERECTOMY     Patient Active Problem List   Diagnosis Date Noted   Other secondary kyphosis, cervicothoracic region 05/19/2023   Compression fracture of T3 vertebra (HCC) 05/19/2023   Compression fracture of C7 vertebra (HCC) 05/19/2023   Osteoporosis 05/19/2023    PCP: Danella Penton, MD  REFERRING PROVIDER: Danella Penton, MD   REFERRING DIAG: S12.600A (ICD-10-CM) - Unspecified displaced fracture of seventh cervical vertebra, initial encounter for closed fracture  THERAPY DIAG:  Cervicalgia  Muscle weakness (generalized)  Rationale for Evaluation and Treatment: Rehabilitation  ONSET DATE: July 2024  SUBJECTIVE:                                                                                                                                                                                                         SUBJECTIVE STATEMENT: Pt reports doing well overall. Has MD appointment tomorrow for other lingering medical issue.   PERTINENT HISTORY:  Pt presents to PT w/ chronic limited cervical mobility 2/2 severe osteoporosis, scleroderma, and hx of cervical fibrosis of the neck. Since previous bout of PT patient  had a fall in the restroom resulting in three fx vertebrae, fx of six ribs, and a fx of her R shoulder blade. Pt's current fx are being managed non-surgically. Pt reports no pain or sx of N/T. She states she has the most difficulty with maintaining a seated position with no back support and is only able to maintain this position for 2-3 minutes before requiring back support. Pt also reports an increased fear of falling.   PAIN:  Are you having pain? No Main aggravation  cannot sit up without back support, increased fear of falling.  Easing factors: having a supported back 2-3 minutes of sitting unsupported before needing support  PRECAUTIONS: Fall  RED FLAGS: None     WEIGHT BEARING RESTRICTIONS: No  FALLS:  Has patient fallen in last 6 months? Yes. Number of falls 1 and near falls  LIVING ENVIRONMENT: Has following equipment at home: Walker - 2 wheeled *Pt does not use RW often  OCCUPATION: Retired, after scleroderma dx  PLOF: Independent  PATIENT GOALS: To get moving again   NEXT MD VISIT: N/A  OBJECTIVE:  Note: Objective measures were completed at Evaluation unless otherwise noted.  DIAGNOSTIC FINDINGS:  CLINICAL DATA:  The patient suffered cervical and thoracic spine compression fractures in a fall 03/25/2023. Subsequent encounter.   EXAM: MRI CERVICAL SPINE WITHOUT CONTRAST   TECHNIQUE: Multiplanar, multisequence MR imaging of the cervical spine was performed. No intravenous contrast was administered.   COMPARISON:  CT chest 04/16/2023.   FINDINGS: Alignment: No listhesis. Mild kyphosis is centered about the T1 level.   Vertebrae: The patient has a superior endplate compression fracture of C7 with vertebral body height loss of approximately 30%, a biconcave compression fracture of T1 vertebral body height loss of approximately 50% and a biconcave compression fracture T3 with vertebral body height loss centrally of approximately 80%. There is marrow edema  within each of the vertebral bodies consistent with acute or early subacute injuries. Marrow edema is also seen in the right fifth and sixth ribs due to the fractures seen on the prior CT.   Cord: Normal signal throughout the visualized cord.   Posterior Fossa, vertebral arteries, paraspinal tissues: Negative.   IMPRESSION: 1. Acute or early subacute compression fractures of C7, T1 and T3 as described above. No retropulsion off the superior endplates of C7 or T1. The central spinal canal and neural foramina are open at all levels. 2. Marrow edema in the right fifth and sixth ribs due to the fractures seen on the prior CT.  PATIENT SURVEYS:  FOTO 48/57  COGNITION: Overall cognitive status: Within functional limits for tasks assessed  SENSATION: WFL  POSTURE: rounded shoulders and forward head  PALPATION: No TTP noted    CERVICAL ROM:   Active ROM A/PROM (deg) eval  Flexion 45 deg  Extension 10 deg  Right lateral flexion 40 deg  Left lateral flexion 35 deg*  Right rotation 40 deg  Left rotation 25 deg*   (Blank rows = not tested)  All cervical PROM= AROM   UPPER EXTREMITY ROM:  Active ROM Right eval Left eval  Shoulder flexion    Shoulder extension    Shoulder abduction 100 deg   Shoulder adduction    Shoulder extension    Shoulder internal rotation C7 C7  Shoulder external rotation Mid- thoracic Mid- thoracic  Elbow flexion    Elbow extension    Wrist flexion    Wrist extension    Wrist ulnar deviation    Wrist radial deviation    Wrist pronation    Wrist supination     (Blank rows = not tested)  UPPER EXTREMITY MMT:  MMT Right eval Left eval  Shoulder flexion 4 4-  Shoulder extension    Shoulder abduction 4+ 4+  Shoulder adduction    Shoulder extension    Shoulder internal rotation 4 4  Shoulder external rotation 4 4  Middle trapezius    Lower trapezius    Elbow flexion 4+ 4+  Elbow extension 4+ 4+  Wrist flexion    Wrist extension     Wrist ulnar deviation    Wrist radial deviation    Wrist pronation    Wrist supination    Grip strength 4 4   (Blank rows = not tested)  CERVICAL SPECIAL TESTS:  Deferred 2/2 to limited cervical mobility  FUNCTIONAL TESTS:  DNF Endurance Test: Deferred to next session   SLS assessment RLE- 16 seconds LLE- 27 seconds w/ bilat UE support 07/08/23  TODAY'S TREATMENT: DATE: 09/15/23  There.ex: UBE L3 2 min forward, 2 min backward for UE and thoracic warm up.   Low load, long duration cervical holds with towel at lower cervical spine for anterior cervical muscle lengthening attempts. 3x1 minute  Seated cervical SNAGS:   Extension: 2x12  Rotation R/L: x12/direction. PT demo and mod multimodal cuing with fair carryover.  Standing shoulder flexion AAROM on blue foam roller for thoracic extension: 2x12, 2# AW's on wrists.   Standing wall angels: 2x12  PATIENT EDUCATION:  Education details: HEP given to pt Person educated: Patient Education method: Medical illustrator Education comprehension: verbalized understanding and returned demonstration  HOME EXERCISE PROGRAM: Access Code: [email or web address] URL: [email or web address] Date: 08/12/2023 Prepared by: Ronnie Derby  Exercises - Supine Chin Tuck  - 1 x daily - 4-5 x weekly - 2 sets - 10 reps - Cervical Extension AROM with Strap  - 1 x daily - 4-5 x weekly - 2 sets - 10 reps - Seated Levator Scapulae Stretch  - 1 x daily - 4-5 x weekly - 1 sets - 2 reps - 30 hold - Seated scapular retraction   - 1 x daily - 4-5 x weekly - 2 sets - 10 reps - Standing Tandem Balance with Counter Support  - 1 x daily - 3-4 x weekly - 1 sets - 3 reps - 30 hold - Standing Single Leg Stance with Counter Support  - 1 x daily - 3-4 x weekly - 1 sets - 3 reps - 30 hold   Access Code: [email or web address] URL: [email or web address] Date: 06/24/2023 Prepared by: Tomasa Hose  Exercises - Supine Chin Tuck  - 1 x daily - 4-5 x  weekly - 2 sets - 10 reps - Cervical Extension AROM with Strap  - 1 x daily - 4-5 x weekly - 2 sets - 10 reps - Seated Levator Scapulae Stretch  - 1 x daily - 4-5 x weekly - 1 sets - 2 reps - 30 hold - Seated scapular retraction   - 1 x daily - 4-5 x weekly - 2 sets - 10 reps  ASSESSMENT:  CLINICAL IMPRESSION: Continuing PT POC focusing on addressing postural deficits (thoracic kyphosis and cervical flexion deficits) and periscapular strengthening. Session regressed due to subjective reports of GI concerns. Use of low load long duration to assist in improved anterior cervical muscle lengthening to address improved cervical AROM. Will continue to progress core stability exercise at f/u visits due to acute GI distress. Pt would continue to benefit from skilled PT interventions to address remaining cervico-thoracic ROM, strength and balance deficits.  OBJECTIVE IMPAIRMENTS: decreased mobility, decreased ROM, decreased strength, hypomobility, and impaired flexibility.   ACTIVITY LIMITATIONS: carrying and lifting  PARTICIPATION LIMITATIONS: community activity  PERSONAL FACTORS: Age, Past/current experiences, Time since onset of injury/illness/exacerbation, and 3+ comorbidities: scleroderma, osteoporosis, hx of cancer  are also affecting patient's functional outcome.   REHAB POTENTIAL: Fair multi- comorbidities  CLINICAL DECISION MAKING: Evolving/moderate complexity  EVALUATION COMPLEXITY: Moderate   GOALS: Goals reviewed with  patient? Yes  SHORT TERM GOALS: Target date: 09/30/23  Pt will be independent with HEP to improve cervical and shoulder strength with functional activities   Baseline: 06/24/23: HEP given to pt; 09/02/23: 25% compliance Goal status: ON GOING   LONG TERM GOALS: Target date: 10/28/23  Pt will improve FOTO to target score to demonstrate clinically significant improvement in functional mobility.   Baseline: 06/24/23: 46/58; 09/02/23: 47/58  Goal status:  ONGOING  2.  Pt will improve all R/L shoulder MMT 4+ to improve functional mobility of bilat UE's.  Baseline: 06/24/23; 09/02/23 MMT Right eval Left eval Left  09/02/23 Right 09/02/23  Shoulder flexion 4 4- 4- 4-  Shoulder extension      Shoulder abduction 4+ 4+ 5 4+  Shoulder adduction      Shoulder extension      Shoulder internal rotation 4 4 4+ 4+  Shoulder external rotation 4 4 4+ 4+   Goal status: PARTIALLY MET  3.  Pt will improve all cervical AROM to [facility] to improve functional mobility.  Baseline: 06/24/23:  Active ROM A/PROM (deg) eval A/PROM (Deg)  09/02/23  Flexion 45 deg 45  Extension 10 deg 10  Right lateral flexion 40 deg 20  Left lateral flexion 35 deg* 35  Right rotation 40 deg 35  Left rotation 25 deg* 25   Goal status: ONGOING   PLAN:  PT FREQUENCY: 1x/week  PT DURATION: 8 weeks  PLANNED INTERVENTIONS: Therapeutic exercises, Therapeutic activity, Neuromuscular re-education, Balance training, Gait training, Patient/Family education, Self Care, Joint mobilization, Spinal manipulation, Spinal mobilization, Cryotherapy, Moist heat, Manual therapy, and Re-evaluation  PLAN FOR NEXT SESSION: Progress note, Emphasize balance activities, Progress cervico-thoracic strength/ shoulder strengthening    [name]. Fairly IV, PT, DPT Physical Therapist- [facility] 09/15/23, 2:34 PM

## 2023-09-23 ENCOUNTER — Ambulatory Visit: Payer: Medicare Other

## 2023-09-23 DIAGNOSIS — M542 Cervicalgia: Secondary | ICD-10-CM | POA: Diagnosis not present

## 2023-09-23 DIAGNOSIS — M6281 Muscle weakness (generalized): Secondary | ICD-10-CM

## 2023-09-23 NOTE — Therapy (Signed)
 OUTPATIENT PHYSICAL THERAPY CERVICAL TREATMENT/PROGRESS NOTE Dates of reporting period: 06/24/23 - 09/23/23   Patient Name: Whitney Mclean MRN: [id number] DOB:[birth year], 57 y.o., female Today's Date: 09/23/2023  END OF SESSION:  PT End of Session - 09/23/23 1304     Visit Number 10    Number of Visits 15    Date for PT Re-Evaluation 10/28/23    Authorization - Number of Visits 30    PT Start Time 1300    PT Stop Time 1345    PT Time Calculation (min) 45 min    Activity Tolerance Patient tolerated treatment well    Behavior During Therapy [facility] for tasks assessed/performed             Past Medical History:  Diagnosis Date   Oral cancer (HCC)    Personal history of chemotherapy    Personal history of radiation therapy    Scleroderma (HCC)    Past Surgical History:  Procedure Laterality Date   ABDOMINAL HYSTERECTOMY     Patient Active Problem List   Diagnosis Date Noted   Other secondary kyphosis, cervicothoracic region 05/19/2023   Compression fracture of T3 vertebra (HCC) 05/19/2023   Compression fracture of C7 vertebra (HCC) 05/19/2023   Osteoporosis 05/19/2023    PCP: Oneil PHEBE Pinal, MD  REFERRING PROVIDER: Oneil PHEBE Pinal, MD   REFERRING DIAG: S12.600A (ICD-10-CM) - Unspecified displaced fracture of seventh cervical vertebra, initial encounter for closed fracture  THERAPY DIAG:  Cervicalgia  Muscle weakness (generalized)  Rationale for Evaluation and Treatment: Rehabilitation  ONSET DATE: July 2024  SUBJECTIVE:                                                                                                                                                                                                         SUBJECTIVE STATEMENT: Pt reports doing well overall. Mild completion of HEP.   PERTINENT HISTORY:  Pt presents to PT w/ chronic limited cervical mobility 2/2 severe osteoporosis, scleroderma, and hx of cervical fibrosis of the neck. Since  previous bout of PT patient had a fall in the restroom resulting in three fx vertebrae, fx of six ribs, and a fx of her R shoulder blade. Pt's current fx are being managed non-surgically. Pt reports no pain or sx of N/T. She states she has the most difficulty with maintaining a seated position with no back support and is only able to maintain this position for 2-3 minutes before requiring back support. Pt also reports an increased fear of falling.   PAIN:  Are you having pain?  No Main aggravation cannot sit up without back support, increased fear of falling.  Easing factors: having a supported back 2-3 minutes of sitting unsupported before needing support  PRECAUTIONS: Fall  RED FLAGS: None     WEIGHT BEARING RESTRICTIONS: No  FALLS:  Has patient fallen in last 6 months? Yes. Number of falls 1 and near falls  LIVING ENVIRONMENT: Has following equipment at home: Walker - 2 wheeled *Pt does not use RW often  OCCUPATION: Retired, after scleroderma dx  PLOF: Independent  PATIENT GOALS: To get moving again   NEXT MD VISIT: N/A  OBJECTIVE:  Note: Objective measures were completed at Evaluation unless otherwise noted.  DIAGNOSTIC FINDINGS:  CLINICAL DATA:  The patient suffered cervical and thoracic spine compression fractures in a fall 03/25/2023. Subsequent encounter.   EXAM: MRI CERVICAL SPINE WITHOUT CONTRAST   TECHNIQUE: Multiplanar, multisequence MR imaging of the cervical spine was performed. No intravenous contrast was administered.   COMPARISON:  CT chest 04/16/2023.   FINDINGS: Alignment: No listhesis. Mild kyphosis is centered about the T1 level.   Vertebrae: The patient has a superior endplate compression fracture of C7 with vertebral body height loss of approximately 30%, a biconcave compression fracture of T1 vertebral body height loss of approximately 50% and a biconcave compression fracture T3 with vertebral body height loss centrally of approximately  80%. There is marrow edema within each of the vertebral bodies consistent with acute or early subacute injuries. Marrow edema is also seen in the right fifth and sixth ribs due to the fractures seen on the prior CT.   Cord: Normal signal throughout the visualized cord.   Posterior Fossa, vertebral arteries, paraspinal tissues: Negative.   IMPRESSION: 1. Acute or early subacute compression fractures of C7, T1 and T3 as described above. No retropulsion off the superior endplates of C7 or T1. The central spinal canal and neural foramina are open at all levels. 2. Marrow edema in the right fifth and sixth ribs due to the fractures seen on the prior CT.  PATIENT SURVEYS:  FOTO 48/57  COGNITION: Overall cognitive status: Within functional limits for tasks assessed  SENSATION: WFL  POSTURE: rounded shoulders and forward head  PALPATION: No TTP noted    CERVICAL ROM:   Active ROM A/PROM (deg) eval  Flexion 45 deg  Extension 10 deg  Right lateral flexion 40 deg  Left lateral flexion 35 deg*  Right rotation 40 deg  Left rotation 25 deg*   (Blank rows = not tested)  All cervical PROM= AROM   UPPER EXTREMITY ROM:  Active ROM Right eval Left eval  Shoulder flexion    Shoulder extension    Shoulder abduction 100 deg   Shoulder adduction    Shoulder extension    Shoulder internal rotation C7 C7  Shoulder external rotation Mid- thoracic Mid- thoracic  Elbow flexion    Elbow extension    Wrist flexion    Wrist extension    Wrist ulnar deviation    Wrist radial deviation    Wrist pronation    Wrist supination     (Blank rows = not tested)  UPPER EXTREMITY MMT:  MMT Right eval Left eval  Shoulder flexion 4 4-  Shoulder extension    Shoulder abduction 4+ 4+  Shoulder adduction    Shoulder extension    Shoulder internal rotation 4 4  Shoulder external rotation 4 4  Middle trapezius    Lower trapezius    Elbow flexion 4+ 4+  Elbow extension  4+ 4+  Wrist  flexion    Wrist extension    Wrist ulnar deviation    Wrist radial deviation    Wrist pronation    Wrist supination    Grip strength 4 4   (Blank rows = not tested)  CERVICAL SPECIAL TESTS:  Deferred 2/2 to limited cervical mobility  FUNCTIONAL TESTS:  DNF Endurance Test: Deferred to next session   SLS assessment RLE- 16 seconds LLE- 27 seconds w/ bilat UE support 07/08/23  TODAY'S TREATMENT: DATE: 09/23/23  There.ex: UBE L3 2 min forward, 2 min backward for UE and thoracic warm up.   FOTO: 46  Low load, long duration cervical holds with towel at lower cervical spine for anterior cervical muscle lengthening attempts. 3x1 minute  Wall push ups: 3x6   Standing shoulder flexion AAROM on blue foam roller for thoracic extension: 2x12, 2# AW's on wrists.   Single limb, landmine overhead press with PVC pipe, CGA: x8/UE  PATIENT EDUCATION:  Education details: HEP given to pt Person educated: Patient Education method: Medical Illustrator Education comprehension: verbalized understanding and returned demonstration  HOME EXERCISE PROGRAM: Access Code: [email or web address] URL: [email or web address] Date: 08/12/2023 Prepared by: Dorina Kingfisher  Exercises - Supine Chin Tuck  - 1 x daily - 4-5 x weekly - 2 sets - 10 reps - Cervical Extension AROM with Strap  - 1 x daily - 4-5 x weekly - 2 sets - 10 reps - Seated Levator Scapulae Stretch  - 1 x daily - 4-5 x weekly - 1 sets - 2 reps - 30 hold - Seated scapular retraction   - 1 x daily - 4-5 x weekly - 2 sets - 10 reps - Standing Tandem Balance with Counter Support  - 1 x daily - 3-4 x weekly - 1 sets - 3 reps - 30 hold - Standing Single Leg Stance with Counter Support  - 1 x daily - 3-4 x weekly - 1 sets - 3 reps - 30 hold   Access Code: [email or web address] URL: [email or web address] Date: 06/24/2023 Prepared by: Sidra Simpers  Exercises - Supine Chin Tuck  - 1 x daily - 4-5 x weekly - 2 sets - 10 reps -  Cervical Extension AROM with Strap  - 1 x daily - 4-5 x weekly - 2 sets - 10 reps - Seated Levator Scapulae Stretch  - 1 x daily - 4-5 x weekly - 1 sets - 2 reps - 30 hold - Seated scapular retraction   - 1 x daily - 4-5 x weekly - 2 sets - 10 reps  ASSESSMENT:  CLINICAL IMPRESSION: Continuing PT POC focusing on addressing postural deficits (thoracic kyphosis and cervical flexion deficits) and periscapular strengthening. Incorporating compound overhead movements involving balance with stepping into lunge positions. Pt does have 1 LOB requiring min to modA from PT to correct from falling. Pt is on 10th visit warranting progress note. Pt has remained grossly unchanged with her FOTO score however the goal of her PT is to maintain current level of function due to her chronic cervicothoracic and R shoulder deficits from scleroderma. Pt remains with very good overhead shoulder mobility but lacks cervical range of motion and generally has weakness in her UE's. PT POC to continue to progress mobility and strength to pt tolerance. Pt would continue to benefit from skilled PT interventions to address remaining cervico-thoracic ROM, strength and balance deficits.  OBJECTIVE IMPAIRMENTS: decreased mobility, decreased ROM, decreased strength, hypomobility, and impaired flexibility.   ACTIVITY LIMITATIONS: carrying and lifting  PARTICIPATION LIMITATIONS: community activity  PERSONAL FACTORS: Age, Past/current experiences, Time since onset of injury/illness/exacerbation, and 3+ comorbidities: scleroderma, osteoporosis, hx of cancer  are also affecting patient's functional outcome.   REHAB POTENTIAL: Fair multi- comorbidities  CLINICAL DECISION MAKING: Evolving/moderate complexity  EVALUATION COMPLEXITY: Moderate   GOALS: Goals reviewed with patient? Yes  SHORT TERM GOALS: Target date: 09/30/23  Pt will be independent with HEP to improve cervical and shoulder strength with functional activities    Baseline: 06/24/23: HEP given to pt; 09/02/23: 25% compliance Goal status: ON GOING   LONG TERM GOALS: Target date: 10/28/23  Pt will improve FOTO to target score to demonstrate clinically significant improvement in functional mobility.   Baseline: 06/24/23: 46/58; 09/02/23: 47/58  Goal status: ONGOING  2.  Pt will improve all R/L shoulder MMT 4+ to improve functional mobility of bilat UE's.  Baseline: 06/24/23; 09/02/23 MMT Right eval Left eval Left  09/02/23 Right 09/02/23  Shoulder flexion 4 4- 4- 4-  Shoulder extension      Shoulder abduction 4+ 4+ 5 4+  Shoulder adduction      Shoulder extension      Shoulder internal rotation 4 4 4+ 4+  Shoulder external rotation 4 4 4+ 4+   Goal status: PARTIALLY MET  3.  Pt will improve all cervical AROM to [facility] to improve functional mobility.  Baseline: 06/24/23:  Active ROM A/PROM (deg) eval A/PROM (Deg)  09/02/23  Flexion 45 deg 45  Extension 10 deg 10  Right lateral flexion 40 deg 20  Left lateral flexion 35 deg* 35  Right rotation 40 deg 35  Left rotation 25 deg* 25   Goal status: ONGOING   PLAN:  PT FREQUENCY: 1x/week  PT DURATION: 8 weeks  PLANNED INTERVENTIONS: Therapeutic exercises, Therapeutic activity, Neuromuscular re-education, Balance training, Gait training, Patient/Family education, Self Care, Joint mobilization, Spinal manipulation, Spinal mobilization, Cryotherapy, Moist heat, Manual therapy, and Re-evaluation  PLAN FOR NEXT SESSION: Emphasize balance activities, Progress cervico-thoracic strength/ shoulder strengthening    Dorina HERO. Fairly IV, PT, DPT Physical Therapist- [place]  [facility] 09/23/23, 1:56 PM

## 2023-09-25 ENCOUNTER — Ambulatory Visit: Payer: Medicare Other

## 2023-09-29 ENCOUNTER — Ambulatory Visit: Payer: Medicare Other

## 2023-10-01 ENCOUNTER — Ambulatory Visit: Payer: Medicare Other | Attending: Internal Medicine

## 2023-10-01 DIAGNOSIS — M6281 Muscle weakness (generalized): Secondary | ICD-10-CM | POA: Insufficient documentation

## 2023-10-01 DIAGNOSIS — M542 Cervicalgia: Secondary | ICD-10-CM | POA: Insufficient documentation

## 2023-10-01 NOTE — Therapy (Signed)
 OUTPATIENT PHYSICAL THERAPY CERVICAL TREATMENT   Patient Name: Whitney Mclean MRN: [id number] DOB:[birth year], 58 y.o., female Today's Date: 10/01/2023  END OF SESSION:  PT End of Session - 10/01/23 1438     Visit Number 11    Number of Visits 15    Date for PT Re-Evaluation 10/28/23    Authorization - Number of Visits 30    PT Start Time 1428    PT Stop Time 1512    PT Time Calculation (min) 44 min    Activity Tolerance Patient tolerated treatment well    Behavior During Therapy WFL for tasks assessed/performed             Past Medical History:  Diagnosis Date   Oral cancer (HCC)    Personal history of chemotherapy    Personal history of radiation therapy    Scleroderma (HCC)    Past Surgical History:  Procedure Laterality Date   ABDOMINAL HYSTERECTOMY     Patient Active Problem List   Diagnosis Date Noted   Other secondary kyphosis, cervicothoracic region 05/19/2023   Compression fracture of T3 vertebra (HCC) 05/19/2023   Compression fracture of C7 vertebra (HCC) 05/19/2023   Osteoporosis 05/19/2023    PCP: Oneil PHEBE Pinal, MD  REFERRING PROVIDER: Oneil PHEBE Pinal, MD   REFERRING DIAG: S12.600A (ICD-10-CM) - Unspecified displaced fracture of seventh cervical vertebra, initial encounter for closed fracture  THERAPY DIAG:  Cervicalgia  Muscle weakness (generalized)  Rationale for Evaluation and Treatment: Rehabilitation  ONSET DATE: July 2024  SUBJECTIVE:                                                                                                                                                                                                         SUBJECTIVE STATEMENT: Pt reports doing well. No concerns/complaints. No falls.   PERTINENT HISTORY:  Pt presents to PT w/ chronic limited cervical mobility 2/2 severe osteoporosis, scleroderma, and hx of cervical fibrosis of the neck. Since previous bout of PT patient had a fall in the restroom resulting  in three fx vertebrae, fx of six ribs, and a fx of her R shoulder blade. Pt's current fx are being managed non-surgically. Pt reports no pain or sx of N/T. She states she has the most difficulty with maintaining a seated position with no back support and is only able to maintain this position for 2-3 minutes before requiring back support. Pt also reports an increased fear of falling.   PAIN:  Are you having pain? No Main aggravation cannot sit up without back support,  increased fear of falling.  Easing factors: having a supported back 2-3 minutes of sitting unsupported before needing support  PRECAUTIONS: Fall  RED FLAGS: None     WEIGHT BEARING RESTRICTIONS: No  FALLS:  Has patient fallen in last 6 months? Yes. Number of falls 1 and near falls  LIVING ENVIRONMENT: Has following equipment at home: Walker - 2 wheeled *Pt does not use RW often  OCCUPATION: Retired, after scleroderma dx  PLOF: Independent  PATIENT GOALS: To get moving again   NEXT MD VISIT: N/A  OBJECTIVE:  Note: Objective measures were completed at Evaluation unless otherwise noted.  DIAGNOSTIC FINDINGS:  CLINICAL DATA:  The patient suffered cervical and thoracic spine compression fractures in a fall 03/25/2023. Subsequent encounter.   EXAM: MRI CERVICAL SPINE WITHOUT CONTRAST   TECHNIQUE: Multiplanar, multisequence MR imaging of the cervical spine was performed. No intravenous contrast was administered.   COMPARISON:  CT chest 04/16/2023.   FINDINGS: Alignment: No listhesis. Mild kyphosis is centered about the T1 level.   Vertebrae: The patient has a superior endplate compression fracture of C7 with vertebral body height loss of approximately 30%, a biconcave compression fracture of T1 vertebral body height loss of approximately 50% and a biconcave compression fracture T3 with vertebral body height loss centrally of approximately 80%. There is marrow edema within each of the vertebral bodies  consistent with acute or early subacute injuries. Marrow edema is also seen in the right fifth and sixth ribs due to the fractures seen on the prior CT.   Cord: Normal signal throughout the visualized cord.   Posterior Fossa, vertebral arteries, paraspinal tissues: Negative.   IMPRESSION: 1. Acute or early subacute compression fractures of C7, T1 and T3 as described above. No retropulsion off the superior endplates of C7 or T1. The central spinal canal and neural foramina are open at all levels. 2. Marrow edema in the right fifth and sixth ribs due to the fractures seen on the prior CT.  PATIENT SURVEYS:  FOTO 48/57  COGNITION: Overall cognitive status: Within functional limits for tasks assessed  SENSATION: WFL  POSTURE: rounded shoulders and forward head  PALPATION: No TTP noted    CERVICAL ROM:   Active ROM A/PROM (deg) eval  Flexion 45 deg  Extension 10 deg  Right lateral flexion 40 deg  Left lateral flexion 35 deg*  Right rotation 40 deg  Left rotation 25 deg*   (Blank rows = not tested)  All cervical PROM= AROM   UPPER EXTREMITY ROM:  Active ROM Right eval Left eval  Shoulder flexion    Shoulder extension    Shoulder abduction 100 deg   Shoulder adduction    Shoulder extension    Shoulder internal rotation C7 C7  Shoulder external rotation Mid- thoracic Mid- thoracic  Elbow flexion    Elbow extension    Wrist flexion    Wrist extension    Wrist ulnar deviation    Wrist radial deviation    Wrist pronation    Wrist supination     (Blank rows = not tested)  UPPER EXTREMITY MMT:  MMT Right eval Left eval  Shoulder flexion 4 4-  Shoulder extension    Shoulder abduction 4+ 4+  Shoulder adduction    Shoulder extension    Shoulder internal rotation 4 4  Shoulder external rotation 4 4  Middle trapezius    Lower trapezius    Elbow flexion 4+ 4+  Elbow extension 4+ 4+  Wrist flexion    Wrist  extension    Wrist ulnar deviation    Wrist  radial deviation    Wrist pronation    Wrist supination    Grip strength 4 4   (Blank rows = not tested)  CERVICAL SPECIAL TESTS:  Deferred 2/2 to limited cervical mobility  FUNCTIONAL TESTS:  DNF Endurance Test: Deferred to next session   SLS assessment RLE- 16 seconds LLE- 27 seconds w/ bilat UE support 07/08/23  TODAY'S TREATMENT: DATE: 10/01/23  There.ex: UBE L3 2 min forward, 2 min backward for UE and thoracic warm up.   Low load, long duration cervical holds with towel at lower cervical spine for anterior cervical muscle lengthening attempts. 3x1 minute  Hook lying snow angels with 1/2 bolster along spine for pec major lengthening. X12 no resistance. X12 with 1# DB's.  Wall push ups: 3x6   Standing shoulder flexion AAROM on blue foam roller for thoracic extension: 2x12, 2# AW's on wrists.   Single limb, landmine overhead press with PVC pipe, CGA: 2x8/UE, 2# AW  PATIENT EDUCATION:  Education details: HEP given to pt Person educated: Patient Education method: Medical Illustrator Education comprehension: verbalized understanding and returned demonstration  HOME EXERCISE PROGRAM: Access Code: [email or web address] URL: [email or web address] Date: 08/12/2023 Prepared by: Dorina Kingfisher  Exercises - Supine Chin Tuck  - 1 x daily - 4-5 x weekly - 2 sets - 10 reps - Cervical Extension AROM with Strap  - 1 x daily - 4-5 x weekly - 2 sets - 10 reps - Seated Levator Scapulae Stretch  - 1 x daily - 4-5 x weekly - 1 sets - 2 reps - 30 hold - Seated scapular retraction   - 1 x daily - 4-5 x weekly - 2 sets - 10 reps - Standing Tandem Balance with Counter Support  - 1 x daily - 3-4 x weekly - 1 sets - 3 reps - 30 hold - Standing Single Leg Stance with Counter Support  - 1 x daily - 3-4 x weekly - 1 sets - 3 reps - 30 hold   Access Code: [email or web address] URL: [email or web address] Date: 06/24/2023 Prepared by: Sidra Simpers  Exercises - Supine Chin Tuck   - 1 x daily - 4-5 x weekly - 2 sets - 10 reps - Cervical Extension AROM with Strap  - 1 x daily - 4-5 x weekly - 2 sets - 10 reps - Seated Levator Scapulae Stretch  - 1 x daily - 4-5 x weekly - 1 sets - 2 reps - 30 hold - Seated scapular retraction   - 1 x daily - 4-5 x weekly - 2 sets - 10 reps  ASSESSMENT:  CLINICAL IMPRESSION: Continuing PT POC focusing on addressing postural deficits (thoracic kyphosis and cervical flexion deficits) and periscapular strengthening. Incorporating compound overhead movements involving balance with stepping into lunge positions. Improved balance noted with land mine pressing but still generally unsteady needing consistent CGA. Will continue to progress/maintain cervical mobility as able and also to continue to progress UE strength for RUE use for ADL completion. Pt would continue to benefit from skilled PT interventions to address remaining cervico-thoracic ROM, strength and balance deficits.  OBJECTIVE IMPAIRMENTS: decreased mobility, decreased ROM, decreased strength, hypomobility, and impaired flexibility.   ACTIVITY LIMITATIONS: carrying and lifting  PARTICIPATION LIMITATIONS: community activity  PERSONAL FACTORS: Age, Past/current experiences, Time since onset of injury/illness/exacerbation, and 3+ comorbidities: scleroderma, osteoporosis, hx of cancer  are also affecting patient's functional outcome.   REHAB POTENTIAL: Fair multi- comorbidities  CLINICAL DECISION MAKING:  Evolving/moderate complexity  EVALUATION COMPLEXITY: Moderate   GOALS: Goals reviewed with patient? Yes  SHORT TERM GOALS: Target date: 09/30/23  Pt will be independent with HEP to improve cervical and shoulder strength with functional activities   Baseline: 06/24/23: HEP given to pt; 09/02/23: 25% compliance Goal status: ON GOING   LONG TERM GOALS: Target date: 10/28/23  Pt will improve FOTO to target score to demonstrate clinically significant improvement in functional  mobility.   Baseline: 06/24/23: 46/58; 09/02/23: 47/58  Goal status: ONGOING  2.  Pt will improve all R/L shoulder MMT 4+ to improve functional mobility of bilat UE's.  Baseline: 06/24/23; 09/02/23 MMT Right eval Left eval Left  09/02/23 Right 09/02/23  Shoulder flexion 4 4- 4- 4-  Shoulder extension      Shoulder abduction 4+ 4+ 5 4+  Shoulder adduction      Shoulder extension      Shoulder internal rotation 4 4 4+ 4+  Shoulder external rotation 4 4 4+ 4+   Goal status: PARTIALLY MET  3.  Pt will improve all cervical AROM to [facility] to improve functional mobility.  Baseline: 06/24/23:  Active ROM A/PROM (deg) eval A/PROM (Deg)  09/02/23  Flexion 45 deg 45  Extension 10 deg 10  Right lateral flexion 40 deg 20  Left lateral flexion 35 deg* 35  Right rotation 40 deg 35  Left rotation 25 deg* 25   Goal status: ONGOING   PLAN:  PT FREQUENCY: 1x/week  PT DURATION: 8 weeks  PLANNED INTERVENTIONS: Therapeutic exercises, Therapeutic activity, Neuromuscular re-education, Balance training, Gait training, Patient/Family education, Self Care, Joint mobilization, Spinal manipulation, Spinal mobilization, Cryotherapy, Moist heat, Manual therapy, and Re-evaluation  PLAN FOR NEXT SESSION: Emphasize balance activities, Progress cervico-thoracic strength/ shoulder strengthening    Dorina HERO. Fairly IV, PT, DPT Physical Therapist- [place]  [facility] 10/01/23, 3:22 PM

## 2023-10-06 ENCOUNTER — Ambulatory Visit: Payer: Medicare Other

## 2023-10-06 DIAGNOSIS — M542 Cervicalgia: Secondary | ICD-10-CM

## 2023-10-06 DIAGNOSIS — M6281 Muscle weakness (generalized): Secondary | ICD-10-CM

## 2023-10-06 NOTE — Therapy (Signed)
 OUTPATIENT PHYSICAL THERAPY CERVICAL TREATMENT   Patient Name: Whitney BRAILSFORD MRN: [id number] DOB:[birth year], 58 y.o., female Today's Date: 10/06/2023  END OF SESSION:  PT End of Session - 10/06/23 1513     Visit Number 12    Number of Visits 15    Date for PT Re-Evaluation 10/28/23    Authorization - Number of Visits 30    PT Start Time 1510    PT Stop Time 1555    PT Time Calculation (min) 45 min    Activity Tolerance Patient tolerated treatment well    Behavior During Therapy [facility] for tasks assessed/performed             Past Medical History:  Diagnosis Date   Oral cancer (HCC)    Personal history of chemotherapy    Personal history of radiation therapy    Scleroderma (HCC)    Past Surgical History:  Procedure Laterality Date   ABDOMINAL HYSTERECTOMY     Patient Active Problem List   Diagnosis Date Noted   Other secondary kyphosis, cervicothoracic region 05/19/2023   Compression fracture of T3 vertebra (HCC) 05/19/2023   Compression fracture of C7 vertebra (HCC) 05/19/2023   Osteoporosis 05/19/2023    PCP: Oneil PHEBE Pinal, MD  REFERRING PROVIDER: Oneil PHEBE Pinal, MD   REFERRING DIAG: S12.600A (ICD-10-CM) - Unspecified displaced fracture of seventh cervical vertebra, initial encounter for closed fracture  THERAPY DIAG:  Cervicalgia  Muscle weakness (generalized)  Rationale for Evaluation and Treatment: Rehabilitation  ONSET DATE: July 2024  SUBJECTIVE:                                                                                                                                                                                                         SUBJECTIVE STATEMENT: Pt reports doing well. Had some soreness after last session.    PERTINENT HISTORY:  Pt presents to PT w/ chronic limited cervical mobility 2/2 severe osteoporosis, scleroderma, and hx of cervical fibrosis of the neck. Since previous bout of PT patient had a fall in the restroom  resulting in three fx vertebrae, fx of six ribs, and a fx of her R shoulder blade. Pt's current fx are being managed non-surgically. Pt reports no pain or sx of N/T. She states she has the most difficulty with maintaining a seated position with no back support and is only able to maintain this position for 2-3 minutes before requiring back support. Pt also reports an increased fear of falling.   PAIN:  Are you having pain? No Main aggravation cannot sit up  without back support, increased fear of falling.  Easing factors: having a supported back 2-3 minutes of sitting unsupported before needing support  PRECAUTIONS: Fall  RED FLAGS: None     WEIGHT BEARING RESTRICTIONS: No  FALLS:  Has patient fallen in last 6 months? Yes. Number of falls 1 and near falls  LIVING ENVIRONMENT: Has following equipment at home: Walker - 2 wheeled *Pt does not use RW often  OCCUPATION: Retired, after scleroderma dx  PLOF: Independent  PATIENT GOALS: To get moving again   NEXT MD VISIT: N/A  OBJECTIVE:  Note: Objective measures were completed at Evaluation unless otherwise noted.  DIAGNOSTIC FINDINGS:  CLINICAL DATA:  The patient suffered cervical and thoracic spine compression fractures in a fall 03/25/2023. Subsequent encounter.   EXAM: MRI CERVICAL SPINE WITHOUT CONTRAST   TECHNIQUE: Multiplanar, multisequence MR imaging of the cervical spine was performed. No intravenous contrast was administered.   COMPARISON:  CT chest 04/16/2023.   FINDINGS: Alignment: No listhesis. Mild kyphosis is centered about the T1 level.   Vertebrae: The patient has a superior endplate compression fracture of C7 with vertebral body height loss of approximately 30%, a biconcave compression fracture of T1 vertebral body height loss of approximately 50% and a biconcave compression fracture T3 with vertebral body height loss centrally of approximately 80%. There is marrow edema within each of the vertebral  bodies consistent with acute or early subacute injuries. Marrow edema is also seen in the right fifth and sixth ribs due to the fractures seen on the prior CT.   Cord: Normal signal throughout the visualized cord.   Posterior Fossa, vertebral arteries, paraspinal tissues: Negative.   IMPRESSION: 1. Acute or early subacute compression fractures of C7, T1 and T3 as described above. No retropulsion off the superior endplates of C7 or T1. The central spinal canal and neural foramina are open at all levels. 2. Marrow edema in the right fifth and sixth ribs due to the fractures seen on the prior CT.  PATIENT SURVEYS:  FOTO 48/57  COGNITION: Overall cognitive status: Within functional limits for tasks assessed  SENSATION: WFL  POSTURE: rounded shoulders and forward head  PALPATION: No TTP noted    CERVICAL ROM:   Active ROM A/PROM (deg) eval  Flexion 45 deg  Extension 10 deg  Right lateral flexion 40 deg  Left lateral flexion 35 deg*  Right rotation 40 deg  Left rotation 25 deg*   (Blank rows = not tested)  All cervical PROM= AROM   UPPER EXTREMITY ROM:  Active ROM Right eval Left eval  Shoulder flexion    Shoulder extension    Shoulder abduction 100 deg   Shoulder adduction    Shoulder extension    Shoulder internal rotation C7 C7  Shoulder external rotation Mid- thoracic Mid- thoracic  Elbow flexion    Elbow extension    Wrist flexion    Wrist extension    Wrist ulnar deviation    Wrist radial deviation    Wrist pronation    Wrist supination     (Blank rows = not tested)  UPPER EXTREMITY MMT:  MMT Right eval Left eval  Shoulder flexion 4 4-  Shoulder extension    Shoulder abduction 4+ 4+  Shoulder adduction    Shoulder extension    Shoulder internal rotation 4 4  Shoulder external rotation 4 4  Middle trapezius    Lower trapezius    Elbow flexion 4+ 4+  Elbow extension 4+ 4+  Wrist flexion  Wrist extension    Wrist ulnar deviation     Wrist radial deviation    Wrist pronation    Wrist supination    Grip strength 4 4   (Blank rows = not tested)  CERVICAL SPECIAL TESTS:  Deferred 2/2 to limited cervical mobility  FUNCTIONAL TESTS:  DNF Endurance Test: Deferred to next session   SLS assessment RLE- 16 seconds LLE- 27 seconds w/ bilat UE support 07/08/23  TODAY'S TREATMENT: DATE: 10/06/23  There.ex: UBE L3 2 min forward, 2 min backward for UE and thoracic warm up.   Hook lying snow angels with 1/2 bolster along spine for pec major lengthening: 2x12 with 1# DB's.  Wall push ups: 3x6   Standing shoulder flexion AAROM on red physioball  for thoracic extension: 2x12, 2# AW's on wrists.   Single limb, landmine overhead press with PVC pipe, CGA: 2x8/UE, 2# AW  Incline physioball Y's: 2x8, CGA. Mod Multimodal cuing. Good carryover post cues  PATIENT EDUCATION:  Education details: HEP given to pt Person educated: Patient Education method: Medical Illustrator Education comprehension: verbalized understanding and returned demonstration  HOME EXERCISE PROGRAM: Access Code: [email or web address] URL: [email or web address] Date: 08/12/2023 Prepared by: Dorina Kingfisher  Exercises - Supine Chin Tuck  - 1 x daily - 4-5 x weekly - 2 sets - 10 reps - Cervical Extension AROM with Strap  - 1 x daily - 4-5 x weekly - 2 sets - 10 reps - Seated Levator Scapulae Stretch  - 1 x daily - 4-5 x weekly - 1 sets - 2 reps - 30 hold - Seated scapular retraction   - 1 x daily - 4-5 x weekly - 2 sets - 10 reps - Standing Tandem Balance with Counter Support  - 1 x daily - 3-4 x weekly - 1 sets - 3 reps - 30 hold - Standing Single Leg Stance with Counter Support  - 1 x daily - 3-4 x weekly - 1 sets - 3 reps - 30 hold   Access Code: [email or web address] URL: [email or web address] Date: 06/24/2023 Prepared by: Sidra Simpers  Exercises - Supine Chin Tuck  - 1 x daily - 4-5 x weekly - 2 sets - 10 reps - Cervical  Extension AROM with Strap  - 1 x daily - 4-5 x weekly - 2 sets - 10 reps - Seated Levator Scapulae Stretch  - 1 x daily - 4-5 x weekly - 1 sets - 2 reps - 30 hold - Seated scapular retraction   - 1 x daily - 4-5 x weekly - 2 sets - 10 reps  ASSESSMENT:  CLINICAL IMPRESSION: Continuing PT POC focusing on addressing postural deficits (thoracic kyphosis and cervical flexion deficits) and periscapular strengthening. Incorporating core stability today with periscapular overhead strengthening. Regular VC's needed for cervical extension due to cervical flexion contracture to assist in improved cervical extension AROM. Pt continues to require regular seated rest breaks due to fatigue. Pt would continue to benefit from skilled PT interventions to address remaining cervico-thoracic ROM, strength and balance deficits.  OBJECTIVE IMPAIRMENTS: decreased mobility, decreased ROM, decreased strength, hypomobility, and impaired flexibility.   ACTIVITY LIMITATIONS: carrying and lifting  PARTICIPATION LIMITATIONS: community activity  PERSONAL FACTORS: Age, Past/current experiences, Time since onset of injury/illness/exacerbation, and 3+ comorbidities: scleroderma, osteoporosis, hx of cancer  are also affecting patient's functional outcome.   REHAB POTENTIAL: Fair multi- comorbidities  CLINICAL DECISION MAKING: Evolving/moderate complexity  EVALUATION COMPLEXITY: Moderate   GOALS: Goals reviewed with patient? Yes  SHORT TERM GOALS: Target date:  09/30/23  Pt will be independent with HEP to improve cervical and shoulder strength with functional activities   Baseline: 06/24/23: HEP given to pt; 09/02/23: 25% compliance Goal status: ON GOING   LONG TERM GOALS: Target date: 10/28/23  Pt will improve FOTO to target score to demonstrate clinically significant improvement in functional mobility.   Baseline: 06/24/23: 46/58; 09/02/23: 47/58  Goal status: ONGOING  2.  Pt will improve all R/L shoulder MMT 4+  to improve functional mobility of bilat UE's.  Baseline: 06/24/23; 09/02/23 MMT Right eval Left eval Left  09/02/23 Right 09/02/23  Shoulder flexion 4 4- 4- 4-  Shoulder extension      Shoulder abduction 4+ 4+ 5 4+  Shoulder adduction      Shoulder extension      Shoulder internal rotation 4 4 4+ 4+  Shoulder external rotation 4 4 4+ 4+   Goal status: PARTIALLY MET  3.  Pt will improve all cervical AROM to [facility] to improve functional mobility.  Baseline: 06/24/23:  Active ROM A/PROM (deg) eval A/PROM (Deg)  09/02/23  Flexion 45 deg 45  Extension 10 deg 10  Right lateral flexion 40 deg 20  Left lateral flexion 35 deg* 35  Right rotation 40 deg 35  Left rotation 25 deg* 25   Goal status: ONGOING   PLAN:  PT FREQUENCY: 1x/week  PT DURATION: 8 weeks  PLANNED INTERVENTIONS: Therapeutic exercises, Therapeutic activity, Neuromuscular re-education, Balance training, Gait training, Patient/Family education, Self Care, Joint mobilization, Spinal manipulation, Spinal mobilization, Cryotherapy, Moist heat, Manual therapy, and Re-evaluation  PLAN FOR NEXT SESSION: Emphasize balance activities, Progress cervico-thoracic strength/ shoulder strengthening    Dorina HERO. Fairly IV, PT, DPT Physical Therapist- [place]  [facility] 10/06/23, 3:55 PM

## 2023-10-13 ENCOUNTER — Ambulatory Visit: Payer: Medicare Other

## 2023-10-13 DIAGNOSIS — M542 Cervicalgia: Secondary | ICD-10-CM

## 2023-10-13 DIAGNOSIS — M6281 Muscle weakness (generalized): Secondary | ICD-10-CM

## 2023-10-13 NOTE — Therapy (Signed)
OUTPATIENT PHYSICAL THERAPY CERVICAL TREATMENT   Patient Name: Whitney Mclean MRN: [id number] DOB:1966/07/30, 58 y.o., female Today's Date: 10/13/2023  END OF SESSION:  PT End of Session - 10/13/23 1533     Visit Number 13    Number of Visits 15    Date for PT Re-Evaluation 10/28/23    Authorization - Number of Visits 30    PT Start Time 1520    PT Stop Time 1600    PT Time Calculation (min) 40 min    Activity Tolerance Patient tolerated treatment well    Behavior During Therapy [facility] for tasks assessed/performed             Past Medical History:  Diagnosis Date   Oral cancer (HCC)    Personal history of chemotherapy    Personal history of radiation therapy    Scleroderma (HCC)    Past Surgical History:  Procedure Laterality Date   ABDOMINAL HYSTERECTOMY     Patient Active Problem List   Diagnosis Date Noted   Other secondary kyphosis, cervicothoracic region 05/19/2023   Compression fracture of T3 vertebra (HCC) 05/19/2023   Compression fracture of C7 vertebra (HCC) 05/19/2023   Osteoporosis 05/19/2023    PCP: Danella Penton, MD  REFERRING PROVIDER: Danella Penton, MD   REFERRING DIAG: S12.600A (ICD-10-CM) - Unspecified displaced fracture of seventh cervical vertebra, initial encounter for closed fracture  THERAPY DIAG:  Cervicalgia  Muscle weakness (generalized)  Rationale for Evaluation and Treatment: Rehabilitation  ONSET DATE: July 2024  SUBJECTIVE:                                                                                                                                                                                                         SUBJECTIVE STATEMENT: Pt continues to do well.   PERTINENT HISTORY:  Pt presents to PT w/ chronic limited cervical mobility 2/2 severe osteoporosis, scleroderma, and hx of cervical fibrosis of the neck. Since previous bout of PT patient had a fall in the restroom resulting in three fx vertebrae, fx of six  ribs, and a fx of her R shoulder blade. Pt's current fx are being managed non-surgically. Pt reports no pain or sx of N/T. She states she has the most difficulty with maintaining a seated position with no back support and is only able to maintain this position for 2-3 minutes before requiring back support. Pt also reports an increased fear of falling.   PAIN:  Are you having pain? No Main aggravation cannot sit up without back support, increased fear of  falling.  Easing factors: having a supported back 2-3 minutes of sitting unsupported before needing support  PRECAUTIONS: Fall  RED FLAGS: None     WEIGHT BEARING RESTRICTIONS: No  FALLS:  Has patient fallen in last 6 months? Yes. Number of falls 1 and near falls  LIVING ENVIRONMENT: Has following equipment at home: Walker - 2 wheeled *Pt does not use RW often  OCCUPATION: Retired, after scleroderma dx  PLOF: Independent  PATIENT GOALS: To get moving again   NEXT MD VISIT: N/A  OBJECTIVE:  Note: Objective measures were completed at Evaluation unless otherwise noted.  DIAGNOSTIC FINDINGS:  CLINICAL DATA:  The patient suffered cervical and thoracic spine compression fractures in a fall 03/25/2023. Subsequent encounter.   EXAM: MRI CERVICAL SPINE WITHOUT CONTRAST   TECHNIQUE: Multiplanar, multisequence MR imaging of the cervical spine was performed. No intravenous contrast was administered.   COMPARISON:  CT chest 04/16/2023.   FINDINGS: Alignment: No listhesis. Mild kyphosis is centered about the T1 level.   Vertebrae: The patient has a superior endplate compression fracture of C7 with vertebral body height loss of approximately 30%, a biconcave compression fracture of T1 vertebral body height loss of approximately 50% and a biconcave compression fracture T3 with vertebral body height loss centrally of approximately 80%. There is marrow edema within each of the vertebral bodies consistent with acute or early  subacute injuries. Marrow edema is also seen in the right fifth and sixth ribs due to the fractures seen on the prior CT.   Cord: Normal signal throughout the visualized cord.   Posterior Fossa, vertebral arteries, paraspinal tissues: Negative.   IMPRESSION: 1. Acute or early subacute compression fractures of C7, T1 and T3 as described above. No retropulsion off the superior endplates of C7 or T1. The central spinal canal and neural foramina are open at all levels. 2. Marrow edema in the right fifth and sixth ribs due to the fractures seen on the prior CT.  PATIENT SURVEYS:  FOTO 48/57  COGNITION: Overall cognitive status: Within functional limits for tasks assessed  SENSATION: WFL  POSTURE: rounded shoulders and forward head  PALPATION: No TTP noted    CERVICAL ROM:   Active ROM A/PROM (deg) eval  Flexion 45 deg  Extension 10 deg  Right lateral flexion 40 deg  Left lateral flexion 35 deg*  Right rotation 40 deg  Left rotation 25 deg*   (Blank rows = not tested)  All cervical PROM= AROM   UPPER EXTREMITY ROM:  Active ROM Right eval Left eval  Shoulder flexion    Shoulder extension    Shoulder abduction 100 deg   Shoulder adduction    Shoulder extension    Shoulder internal rotation C7 C7  Shoulder external rotation Mid- thoracic Mid- thoracic  Elbow flexion    Elbow extension    Wrist flexion    Wrist extension    Wrist ulnar deviation    Wrist radial deviation    Wrist pronation    Wrist supination     (Blank rows = not tested)  UPPER EXTREMITY MMT:  MMT Right eval Left eval  Shoulder flexion 4 4-  Shoulder extension    Shoulder abduction 4+ 4+  Shoulder adduction    Shoulder extension    Shoulder internal rotation 4 4  Shoulder external rotation 4 4  Middle trapezius    Lower trapezius    Elbow flexion 4+ 4+  Elbow extension 4+ 4+  Wrist flexion    Wrist extension  Wrist ulnar deviation    Wrist radial deviation    Wrist  pronation    Wrist supination    Grip strength 4 4   (Blank rows = not tested)  CERVICAL SPECIAL TESTS:  Deferred 2/2 to limited cervical mobility  FUNCTIONAL TESTS:  DNF Endurance Test: Deferred to next session   SLS assessment RLE- 16 seconds LLE- 27 seconds w/ bilat UE support 07/08/23  TODAY'S TREATMENT: DATE: 10/13/23  There.ex: UBE L4 2 min forward, 2 min backward for UE and thoracic warm up.   Hook lying snow angels with 1/2 bolster along spine for pec major lengthening: 2x12 with 1# DB's.  Seated scaption with 1# DB's: 3x8   Seated on physioball: Alternating 1# DB punches: 3x10, Supervision to PRN CGA for core stability  Standing UE press + ipsilateral lunge with long PVC pipe and 3# AW on wrist. 3x8/UE, CGA.   PATIENT EDUCATION:  Education details: HEP given to pt Person educated: Patient Education method: Medical illustrator Education comprehension: verbalized understanding and returned demonstration  HOME EXERCISE PROGRAM: Access Code: [email or web address] URL: [email or web address] Date: 08/12/2023 Prepared by: Ronnie Derby  Exercises - Supine Chin Tuck  - 1 x daily - 4-5 x weekly - 2 sets - 10 reps - Cervical Extension AROM with Strap  - 1 x daily - 4-5 x weekly - 2 sets - 10 reps - Seated Levator Scapulae Stretch  - 1 x daily - 4-5 x weekly - 1 sets - 2 reps - 30 hold - Seated scapular retraction   - 1 x daily - 4-5 x weekly - 2 sets - 10 reps - Standing Tandem Balance with Counter Support  - 1 x daily - 3-4 x weekly - 1 sets - 3 reps - 30 hold - Standing Single Leg Stance with Counter Support  - 1 x daily - 3-4 x weekly - 1 sets - 3 reps - 30 hold   Access Code: [email or web address] URL: [email or web address] Date: 06/24/2023 Prepared by: Tomasa Hose  Exercises - Supine Chin Tuck  - 1 x daily - 4-5 x weekly - 2 sets - 10 reps - Cervical Extension AROM with Strap  - 1 x daily - 4-5 x weekly - 2 sets - 10 reps - Seated Levator  Scapulae Stretch  - 1 x daily - 4-5 x weekly - 1 sets - 2 reps - 30 hold - Seated scapular retraction   - 1 x daily - 4-5 x weekly - 2 sets - 10 reps  ASSESSMENT:  CLINICAL IMPRESSION: Continuing PT POC focusing on addressing postural deficits (thoracic kyphosis and cervical flexion deficits) and periscapular strengthening. Incorporating core stability today continuing use of physioball activities and lunging and overhead presses. Pt would continue to benefit from skilled PT interventions to address remaining cervico-thoracic ROM, strength and balance deficits.  OBJECTIVE IMPAIRMENTS: decreased mobility, decreased ROM, decreased strength, hypomobility, and impaired flexibility.   ACTIVITY LIMITATIONS: carrying and lifting  PARTICIPATION LIMITATIONS: community activity  PERSONAL FACTORS: Age, Past/current experiences, Time since onset of injury/illness/exacerbation, and 3+ comorbidities: scleroderma, osteoporosis, hx of cancer  are also affecting patient's functional outcome.   REHAB POTENTIAL: Fair multi- comorbidities  CLINICAL DECISION MAKING: Evolving/moderate complexity  EVALUATION COMPLEXITY: Moderate   GOALS: Goals reviewed with patient? Yes  SHORT TERM GOALS: Target date: 09/30/23  Pt will be independent with HEP to improve cervical and shoulder strength with functional activities   Baseline: 06/24/23: HEP given to pt; 09/02/23: 25% compliance Goal status: ON GOING   LONG  TERM GOALS: Target date: 10/28/23  Pt will improve FOTO to target score to demonstrate clinically significant improvement in functional mobility.   Baseline: 06/24/23: 46/58; 09/02/23: 47/58  Goal status: ONGOING  2.  Pt will improve all R/L shoulder MMT 4+ to improve functional mobility of bilat UE's.  Baseline: 06/24/23; 09/02/23 MMT Right eval Left eval Left  09/02/23 Right 09/02/23  Shoulder flexion 4 4- 4- 4-  Shoulder extension      Shoulder abduction 4+ 4+ 5 4+  Shoulder adduction       Shoulder extension      Shoulder internal rotation 4 4 4+ 4+  Shoulder external rotation 4 4 4+ 4+   Goal status: PARTIALLY MET  3.  Pt will improve all cervical AROM to [facility] to improve functional mobility.  Baseline: 06/24/23:  Active ROM A/PROM (deg) eval A/PROM (Deg)  09/02/23  Flexion 45 deg 45  Extension 10 deg 10  Right lateral flexion 40 deg 20  Left lateral flexion 35 deg* 35  Right rotation 40 deg 35  Left rotation 25 deg* 25   Goal status: ONGOING   PLAN:  PT FREQUENCY: 1x/week  PT DURATION: 8 weeks  PLANNED INTERVENTIONS: Therapeutic exercises, Therapeutic activity, Neuromuscular re-education, Balance training, Gait training, Patient/Family education, Self Care, Joint mobilization, Spinal manipulation, Spinal mobilization, Cryotherapy, Moist heat, Manual therapy, and Re-evaluation  PLAN FOR NEXT SESSION: Emphasize balance activities, Progress cervico-thoracic strength/ shoulder strengthening    [name]. Fairly IV, PT, DPT Physical Therapist- [facility] 10/13/23, 4:07 PM

## 2023-10-16 ENCOUNTER — Other Ambulatory Visit: Payer: Self-pay | Admitting: Internal Medicine

## 2023-10-16 DIAGNOSIS — Z1231 Encounter for screening mammogram for malignant neoplasm of breast: Secondary | ICD-10-CM

## 2023-10-21 ENCOUNTER — Ambulatory Visit: Payer: Medicare Other

## 2023-10-21 DIAGNOSIS — M542 Cervicalgia: Secondary | ICD-10-CM | POA: Diagnosis not present

## 2023-10-21 DIAGNOSIS — M6281 Muscle weakness (generalized): Secondary | ICD-10-CM

## 2023-10-21 NOTE — Therapy (Signed)
OUTPATIENT PHYSICAL THERAPY CERVICAL TREATMENT   Patient Name: Whitney Mclean MRN: [id number] DOB:May 07, 1966, 58 y.o., female Today's Date: 10/21/2023  END OF SESSION:  PT End of Session - 10/21/23 1521     Visit Number 14    Number of Visits 15    Date for PT Re-Evaluation 10/28/23    Authorization - Number of Visits 30    PT Start Time 1515    PT Stop Time 1600    PT Time Calculation (min) 45 min    Activity Tolerance Patient tolerated treatment well    Behavior During Therapy [facility] for tasks assessed/performed             Past Medical History:  Diagnosis Date   Oral cancer (HCC)    Personal history of chemotherapy    Personal history of radiation therapy    Scleroderma (HCC)    Past Surgical History:  Procedure Laterality Date   ABDOMINAL HYSTERECTOMY     Patient Active Problem List   Diagnosis Date Noted   Other secondary kyphosis, cervicothoracic region 05/19/2023   Compression fracture of T3 vertebra (HCC) 05/19/2023   Compression fracture of C7 vertebra (HCC) 05/19/2023   Osteoporosis 05/19/2023    PCP: Danella Penton, MD  REFERRING PROVIDER: Danella Penton, MD   REFERRING DIAG: S12.600A (ICD-10-CM) - Unspecified displaced fracture of seventh cervical vertebra, initial encounter for closed fracture  THERAPY DIAG:  Cervicalgia  Muscle weakness (generalized)  Rationale for Evaluation and Treatment: Rehabilitation  ONSET DATE: July 2024  SUBJECTIVE:                                                                                                                                                                                                         SUBJECTIVE STATEMENT: Pt states she is doing well. No falls.   PERTINENT HISTORY:  Pt presents to PT w/ chronic limited cervical mobility 2/2 severe osteoporosis, scleroderma, and hx of cervical fibrosis of the neck. Since previous bout of PT patient had a fall in the restroom resulting in three fx  vertebrae, fx of six ribs, and a fx of her R shoulder blade. Pt's current fx are being managed non-surgically. Pt reports no pain or sx of N/T. She states she has the most difficulty with maintaining a seated position with no back support and is only able to maintain this position for 2-3 minutes before requiring back support. Pt also reports an increased fear of falling.   PAIN:  Are you having pain? No Main aggravation cannot sit up without back support,  increased fear of falling.  Easing factors: having a supported back 2-3 minutes of sitting unsupported before needing support  PRECAUTIONS: Fall  RED FLAGS: None     WEIGHT BEARING RESTRICTIONS: No  FALLS:  Has patient fallen in last 6 months? Yes. Number of falls 1 and near falls  LIVING ENVIRONMENT: Has following equipment at home: Walker - 2 wheeled *Pt does not use RW often  OCCUPATION: Retired, after scleroderma dx  PLOF: Independent  PATIENT GOALS: To get moving again   NEXT MD VISIT: N/A  OBJECTIVE:  Note: Objective measures were completed at Evaluation unless otherwise noted.  DIAGNOSTIC FINDINGS:  CLINICAL DATA:  The patient suffered cervical and thoracic spine compression fractures in a fall 03/25/2023. Subsequent encounter.   EXAM: MRI CERVICAL SPINE WITHOUT CONTRAST   TECHNIQUE: Multiplanar, multisequence MR imaging of the cervical spine was performed. No intravenous contrast was administered.   COMPARISON:  CT chest 04/16/2023.   FINDINGS: Alignment: No listhesis. Mild kyphosis is centered about the T1 level.   Vertebrae: The patient has a superior endplate compression fracture of C7 with vertebral body height loss of approximately 30%, a biconcave compression fracture of T1 vertebral body height loss of approximately 50% and a biconcave compression fracture T3 with vertebral body height loss centrally of approximately 80%. There is marrow edema within each of the vertebral bodies consistent  with acute or early subacute injuries. Marrow edema is also seen in the right fifth and sixth ribs due to the fractures seen on the prior CT.   Cord: Normal signal throughout the visualized cord.   Posterior Fossa, vertebral arteries, paraspinal tissues: Negative.   IMPRESSION: 1. Acute or early subacute compression fractures of C7, T1 and T3 as described above. No retropulsion off the superior endplates of C7 or T1. The central spinal canal and neural foramina are open at all levels. 2. Marrow edema in the right fifth and sixth ribs due to the fractures seen on the prior CT.  PATIENT SURVEYS:  FOTO 48/57  COGNITION: Overall cognitive status: Within functional limits for tasks assessed  SENSATION: WFL  POSTURE: rounded shoulders and forward head  PALPATION: No TTP noted    CERVICAL ROM:   Active ROM A/PROM (deg) eval  Flexion 45 deg  Extension 10 deg  Right lateral flexion 40 deg  Left lateral flexion 35 deg*  Right rotation 40 deg  Left rotation 25 deg*   (Blank rows = not tested)  All cervical PROM= AROM   UPPER EXTREMITY ROM:  Active ROM Right eval Left eval  Shoulder flexion    Shoulder extension    Shoulder abduction 100 deg   Shoulder adduction    Shoulder extension    Shoulder internal rotation C7 C7  Shoulder external rotation Mid- thoracic Mid- thoracic  Elbow flexion    Elbow extension    Wrist flexion    Wrist extension    Wrist ulnar deviation    Wrist radial deviation    Wrist pronation    Wrist supination     (Blank rows = not tested)  UPPER EXTREMITY MMT:  MMT Right eval Left eval  Shoulder flexion 4 4-  Shoulder extension    Shoulder abduction 4+ 4+  Shoulder adduction    Shoulder extension    Shoulder internal rotation 4 4  Shoulder external rotation 4 4  Middle trapezius    Lower trapezius    Elbow flexion 4+ 4+  Elbow extension 4+ 4+  Wrist flexion    Wrist  extension    Wrist ulnar deviation    Wrist radial  deviation    Wrist pronation    Wrist supination    Grip strength 4 4   (Blank rows = not tested)  CERVICAL SPECIAL TESTS:  Deferred 2/2 to limited cervical mobility  FUNCTIONAL TESTS:  DNF Endurance Test: Deferred to next session   SLS assessment RLE- 16 seconds LLE- 27 seconds w/ bilat UE support 07/08/23  TODAY'S TREATMENT: DATE: 10/21/23  There.ex: UBE L4 2 min forward, 2 min backward for UE and thoracic warm up.   Hook lying snow angels with 1/2 bolster along spine for pec major lengthening: 2x12 with 2# DB's.  Seated scaption with 2# DB's: 3x8 BUE's  Seated alternating shoulder press with 2# DB's: 3x6/UE  Seated on physioball:   Alt hip flexion with contralateral shoulder flexion, CGA. 2x8/side for core stability.  Standing UE press + ipsilateral lunge with long PVC pipe and 3# AW on wrist. 3x8/UE, CGA.   PATIENT EDUCATION:  Education details: HEP given to pt Person educated: Patient Education method: Medical illustrator Education comprehension: verbalized understanding and returned demonstration  HOME EXERCISE PROGRAM: Access Code: [email or web address] URL: [email or web address] Date: 08/12/2023 Prepared by: Ronnie Derby  Exercises - Supine Chin Tuck  - 1 x daily - 4-5 x weekly - 2 sets - 10 reps - Cervical Extension AROM with Strap  - 1 x daily - 4-5 x weekly - 2 sets - 10 reps - Seated Levator Scapulae Stretch  - 1 x daily - 4-5 x weekly - 1 sets - 2 reps - 30 hold - Seated scapular retraction   - 1 x daily - 4-5 x weekly - 2 sets - 10 reps - Standing Tandem Balance with Counter Support  - 1 x daily - 3-4 x weekly - 1 sets - 3 reps - 30 hold - Standing Single Leg Stance with Counter Support  - 1 x daily - 3-4 x weekly - 1 sets - 3 reps - 30 hold   Access Code: [email or web address] URL: [email or web address] Date: 06/24/2023 Prepared by: Tomasa Hose  Exercises - Supine Chin Tuck  - 1 x daily - 4-5 x weekly - 2 sets - 10 reps -  Cervical Extension AROM with Strap  - 1 x daily - 4-5 x weekly - 2 sets - 10 reps - Seated Levator Scapulae Stretch  - 1 x daily - 4-5 x weekly - 1 sets - 2 reps - 30 hold - Seated scapular retraction   - 1 x daily - 4-5 x weekly - 2 sets - 10 reps  ASSESSMENT:  CLINICAL IMPRESSION: Continuing PT POC focusing on addressing postural deficits (thoracic kyphosis and cervical flexion deficits) and periscapular strengthening. Able to progress core stability today with continued use of physioball and limb elevation along with lunging combined with overhead presses. Pt approaching end of POC and will require re-certification at next session. Pt would continue to benefit from skilled PT interventions to address remaining cervico-thoracic ROM, strength and balance deficits.  OBJECTIVE IMPAIRMENTS: decreased mobility, decreased ROM, decreased strength, hypomobility, and impaired flexibility.   ACTIVITY LIMITATIONS: carrying and lifting  PARTICIPATION LIMITATIONS: community activity  PERSONAL FACTORS: Age, Past/current experiences, Time since onset of injury/illness/exacerbation, and 3+ comorbidities: scleroderma, osteoporosis, hx of cancer  are also affecting patient's functional outcome.   REHAB POTENTIAL: Fair multi- comorbidities  CLINICAL DECISION MAKING: Evolving/moderate complexity  EVALUATION COMPLEXITY: Moderate   GOALS: Goals reviewed with patient? Yes  SHORT TERM GOALS: Target date: 09/30/23  Pt will be independent with HEP to improve cervical and shoulder strength with functional activities   Baseline: 06/24/23: HEP given to pt; 09/02/23: 25% compliance Goal status: ON GOING   LONG TERM GOALS: Target date: 10/28/23  Pt will improve FOTO to target score to demonstrate clinically significant improvement in functional mobility.   Baseline: 06/24/23: 46/58; 09/02/23: 47/58  Goal status: ONGOING  2.  Pt will improve all R/L shoulder MMT 4+ to improve functional mobility of bilat  UE's.  Baseline: 06/24/23; 09/02/23 MMT Right eval Left eval Left  09/02/23 Right 09/02/23  Shoulder flexion 4 4- 4- 4-  Shoulder extension      Shoulder abduction 4+ 4+ 5 4+  Shoulder adduction      Shoulder extension      Shoulder internal rotation 4 4 4+ 4+  Shoulder external rotation 4 4 4+ 4+   Goal status: PARTIALLY MET  3.  Pt will improve all cervical AROM to [facility] to improve functional mobility.  Baseline: 06/24/23:  Active ROM A/PROM (deg) eval A/PROM (Deg)  09/02/23  Flexion 45 deg 45  Extension 10 deg 10  Right lateral flexion 40 deg 20  Left lateral flexion 35 deg* 35  Right rotation 40 deg 35  Left rotation 25 deg* 25   Goal status: ONGOING   PLAN:  PT FREQUENCY: 1x/week  PT DURATION: 8 weeks  PLANNED INTERVENTIONS: Therapeutic exercises, Therapeutic activity, Neuromuscular re-education, Balance training, Gait training, Patient/Family education, Self Care, Joint mobilization, Spinal manipulation, Spinal mobilization, Cryotherapy, Moist heat, Manual therapy, and Re-evaluation  PLAN FOR NEXT SESSION: RECERT. Progress cervico-thoracic strength/ shoulder strengthening  [name]. Fairly IV, PT, DPT Physical Therapist- [place]  [facility] 10/21/23, 9:17 PM

## 2023-10-27 ENCOUNTER — Ambulatory Visit: Payer: Medicare Other

## 2023-11-03 ENCOUNTER — Ambulatory Visit: Payer: Medicare Other | Attending: Internal Medicine

## 2023-11-03 DIAGNOSIS — M6281 Muscle weakness (generalized): Secondary | ICD-10-CM | POA: Diagnosis present

## 2023-11-03 DIAGNOSIS — M542 Cervicalgia: Secondary | ICD-10-CM | POA: Diagnosis present

## 2023-11-03 NOTE — Therapy (Signed)
 OUTPATIENT PHYSICAL THERAPY CERVICAL TREATMENT/RECERT   Patient Name: KINDYL BARSCH MRN: [id number] DOB:1966-02-21, 58 y.o., female Today's Date: 11/03/2023  END OF SESSION:  PT End of Session - 11/03/23 1442     Visit Number 15    Number of Visits 30    Date for PT Re-Evaluation 01/26/24    Authorization - Number of Visits 30    PT Start Time 1432    PT Stop Time 1515    PT Time Calculation (min) 43 min    Activity Tolerance Patient tolerated treatment well    Behavior During Therapy [facility] for tasks assessed/performed             Past Medical History:  Diagnosis Date   Oral cancer (HCC)    Personal history of chemotherapy    Personal history of radiation therapy    Scleroderma (HCC)    Past Surgical History:  Procedure Laterality Date   ABDOMINAL HYSTERECTOMY     Patient Active Problem List   Diagnosis Date Noted   Other secondary kyphosis, cervicothoracic region 05/19/2023   Compression fracture of T3 vertebra (HCC) 05/19/2023   Compression fracture of C7 vertebra (HCC) 05/19/2023   Osteoporosis 05/19/2023    PCP: Sari Cunning, MD  REFERRING PROVIDER: Sari Cunning, MD   REFERRING DIAG: S12.600A (ICD-10-CM) - Unspecified displaced fracture of seventh cervical vertebra, initial encounter for closed fracture  THERAPY DIAG:  Cervicalgia  Muscle weakness (generalized)  Rationale for Evaluation and Treatment: Rehabilitation  ONSET DATE: July 2024  SUBJECTIVE:                                                                                                                                                                                                         SUBJECTIVE STATEMENT: Pt states she is doing well. Had to miss last week due to a funeral.   PERTINENT HISTORY:  Pt presents to PT w/ chronic limited cervical mobility 2/2 severe osteoporosis, scleroderma, and hx of cervical fibrosis of the neck. Since previous bout of PT patient had a fall in the  restroom resulting in three fx vertebrae, fx of six ribs, and a fx of her R shoulder blade. Pt's current fx are being managed non-surgically. Pt reports no pain or sx of N/T. She states she has the most difficulty with maintaining a seated position with no back support and is only able to maintain this position for 2-3 minutes before requiring back support. Pt also reports an increased fear of falling.   PAIN:  Are you having pain? No Main  aggravation cannot sit up without back support, increased fear of falling.  Easing factors: having a supported back 2-3 minutes of sitting unsupported before needing support  PRECAUTIONS: Fall  RED FLAGS: None     WEIGHT BEARING RESTRICTIONS: No  FALLS:  Has patient fallen in last 6 months? Yes. Number of falls 1 and near falls  LIVING ENVIRONMENT: Has following equipment at home: Walker - 2 wheeled *Pt does not use RW often  OCCUPATION: Retired, after scleroderma dx  PLOF: Independent  PATIENT GOALS: To get moving again   NEXT MD VISIT: N/A  OBJECTIVE:  Note: Objective measures were completed at Evaluation unless otherwise noted.  DIAGNOSTIC FINDINGS:  CLINICAL DATA:  The patient suffered cervical and thoracic spine compression fractures in a fall 03/25/2023. Subsequent encounter.   EXAM: MRI CERVICAL SPINE WITHOUT CONTRAST   TECHNIQUE: Multiplanar, multisequence MR imaging of the cervical spine was performed. No intravenous contrast was administered.   COMPARISON:  CT chest 04/16/2023.   FINDINGS: Alignment: No listhesis. Mild kyphosis is centered about the T1 level.   Vertebrae: The patient has a superior endplate compression fracture of C7 with vertebral body height loss of approximately 30%, a biconcave compression fracture of T1 vertebral body height loss of approximately 50% and a biconcave compression fracture T3 with vertebral body height loss centrally of approximately 80%. There is marrow edema within each of the  vertebral bodies consistent with acute or early subacute injuries. Marrow edema is also seen in the right fifth and sixth ribs due to the fractures seen on the prior CT.   Cord: Normal signal throughout the visualized cord.   Posterior Fossa, vertebral arteries, paraspinal tissues: Negative.   IMPRESSION: 1. Acute or early subacute compression fractures of C7, T1 and T3 as described above. No retropulsion off the superior endplates of C7 or T1. The central spinal canal and neural foramina are open at all levels. 2. Marrow edema in the right fifth and sixth ribs due to the fractures seen on the prior CT.  PATIENT SURVEYS:  FOTO 48/57  COGNITION: Overall cognitive status: Within functional limits for tasks assessed  SENSATION: WFL  POSTURE: rounded shoulders and forward head  PALPATION: No TTP noted    CERVICAL ROM:   Active ROM A/PROM (deg) eval  Flexion 45 deg  Extension 10 deg  Right lateral flexion 40 deg  Left lateral flexion 35 deg*  Right rotation 40 deg  Left rotation 25 deg*   (Blank rows = not tested)  All cervical PROM= AROM   UPPER EXTREMITY ROM:  Active ROM Right eval Left eval  Shoulder flexion    Shoulder extension    Shoulder abduction 100 deg   Shoulder adduction    Shoulder extension    Shoulder internal rotation C7 C7  Shoulder external rotation Mid- thoracic Mid- thoracic  Elbow flexion    Elbow extension    Wrist flexion    Wrist extension    Wrist ulnar deviation    Wrist radial deviation    Wrist pronation    Wrist supination     (Blank rows = not tested)  UPPER EXTREMITY MMT:  MMT Right eval Left eval  Shoulder flexion 4 4-  Shoulder extension    Shoulder abduction 4+ 4+  Shoulder adduction    Shoulder extension    Shoulder internal rotation 4 4  Shoulder external rotation 4 4  Middle trapezius    Lower trapezius    Elbow flexion 4+ 4+  Elbow extension 4+ 4+  Wrist flexion    Wrist extension    Wrist ulnar  deviation    Wrist radial deviation    Wrist pronation    Wrist supination    Grip strength 4 4   (Blank rows = not tested)  CERVICAL SPECIAL TESTS:  Deferred 2/2 to limited cervical mobility  FUNCTIONAL TESTS:  DNF Endurance Test: Deferred to next session   SLS assessment RLE- 16 seconds LLE- 27 seconds w/ bilat UE support 07/08/23  TODAY'S TREATMENT: DATE: 11/03/23  There.ex: UBE L4 2 min forward, 2 min backward for UE and thoracic warm up. Not billed.  Reviewed goals towards POC.   Standing RTC ER/IR isometric walk outs with RLE for RTC stability and balance using TB as perturbation  Standing B shoulder abduction with 2# DB's: 3x6/UE  PATIENT EDUCATION:  Education details: HEP given to pt Person educated: Patient Education method: Medical illustrator Education comprehension: verbalized understanding and returned demonstration  HOME EXERCISE PROGRAM: Access Code: [email or web address] URL: [email or web address] Date: 08/12/2023 Prepared by: Lyda Samples  Exercises - Supine Chin Tuck  - 1 x daily - 4-5 x weekly - 2 sets - 10 reps - Cervical Extension AROM with Strap  - 1 x daily - 4-5 x weekly - 2 sets - 10 reps - Seated Levator Scapulae Stretch  - 1 x daily - 4-5 x weekly - 1 sets - 2 reps - 30 hold - Seated scapular retraction   - 1 x daily - 4-5 x weekly - 2 sets - 10 reps - Standing Tandem Balance with Counter Support  - 1 x daily - 3-4 x weekly - 1 sets - 3 reps - 30 hold - Standing Single Leg Stance with Counter Support  - 1 x daily - 3-4 x weekly - 1 sets - 3 reps - 30 hold   Access Code: [email or web address] URL: [email or web address] Date: 06/24/2023 Prepared by: Dawson Europe  Exercises - Supine Chin Tuck  - 1 x daily - 4-5 x weekly - 2 sets - 10 reps - Cervical Extension AROM with Strap  - 1 x daily - 4-5 x weekly - 2 sets - 10 reps - Seated Levator Scapulae Stretch  - 1 x daily - 4-5 x weekly - 1 sets - 2 reps - 30 hold - Seated  scapular retraction   - 1 x daily - 4-5 x weekly - 2 sets - 10 reps  ASSESSMENT:  CLINICAL IMPRESSION: Pt at end of POC warranting PT RECERT. Pt has fluctuated in her goals. She has improved in certain aspects of her cervical ROM and has also improved in her shoulder strength some. However she has regressed in her RUE shoulder ER and IR strength. Pt has also improved in her FOTO score indicative of improved functional mobility. Pt is in a maintenance POC for chronic condition of scleroderma so massive gains and improvements are not expected, but the expectation is to improve current functional capacity. PT to request additional 1x/week for 12 weeks.   Continuing PT POC focusing on addressing postural deficits (thoracic kyphosis and cervical flexion deficits) and periscapular strengthening. Able to progress core stability today with continued use of physioball and limb elevation along with lunging combined with overhead presses. Pt approaching end of POC and will require re-certification at next session. Pt would continue to benefit from skilled PT interventions to address remaining cervico-thoracic ROM, strength and balance deficits.  OBJECTIVE IMPAIRMENTS: decreased mobility, decreased ROM, decreased strength, hypomobility, and impaired flexibility.   ACTIVITY LIMITATIONS: carrying and  lifting  PARTICIPATION LIMITATIONS: community activity  PERSONAL FACTORS: Age, Past/current experiences, Time since onset of injury/illness/exacerbation, and 3+ comorbidities: scleroderma, osteoporosis, hx of cancer  are also affecting patient's functional outcome.   REHAB POTENTIAL: Fair multi- comorbidities  CLINICAL DECISION MAKING: Evolving/moderate complexity  EVALUATION COMPLEXITY: Moderate   GOALS: Goals reviewed with patient? Yes  SHORT TERM GOALS: Target date: 12/01/23  Pt will be independent with HEP to improve cervical and shoulder strength with functional activities   Baseline: 06/24/23: HEP  given to pt; 09/02/23: 25% compliance; 11/03/23: 25% compliance Goal status: ON GOING   LONG TERM GOALS: Target date: 01/26/24  Pt will improve FOTO to target score to demonstrate clinically significant improvement in functional mobility.   Baseline: 06/24/23: 46/58; 09/02/23: 47/58; 11/03/23: 50/58 Goal status: ONGOING  2.  Pt will improve all R/L shoulder MMT 4+ to improve functional mobility of bilat UE's.  Baseline: 06/24/23; 09/02/23; 11/03/23; MMT Right eval Left eval Left  09/02/23 Right 09/02/23 Right  11/03/23 Left 11/03/23  Shoulder flexion 4 4- 4- 4- 4 4+  Shoulder extension        Shoulder abduction 4+ 4+ 5 4+ 4+ 5  Shoulder adduction        Shoulder extension        Shoulder internal rotation 4 4 4+ 4+ 4 5  Shoulder external rotation 4 4 4+ 4+ 4 5   Goal status: PARTIALLY MET  3.  Pt will improve all cervical AROM to [facility] to improve functional mobility.  Baseline: 06/24/23:  Active ROM A/PROM (deg) eval A/PROM (Deg)  09/02/23 A/PROM (Deg)  11/03/23  Flexion 45 deg 45 45  Extension 10 deg 10 10  Right lateral flexion 40 deg 20 40  Left lateral flexion 35 deg* 35 40  Right rotation 40 deg 35 40  Left rotation 25 deg* 25 30   Goal status: ONGOING   PLAN:  PT FREQUENCY: 1x/week  PT DURATION: 12 weeks  PLANNED INTERVENTIONS: Therapeutic exercises, Therapeutic activity, Neuromuscular re-education, Balance training, Gait training, Patient/Family education, Self Care, Joint mobilization, Spinal manipulation, Spinal mobilization, Cryotherapy, Moist heat, Manual therapy, and Re-evaluation  PLAN FOR NEXT SESSION: RECERT. Progress cervico-thoracic strength/ shoulder strengthening  Marc Senior. Fairly IV, PT, DPT Physical Therapist- [place]  [facility] 11/03/23, 3:39 PM

## 2023-11-10 ENCOUNTER — Ambulatory Visit: Payer: Medicare Other

## 2023-11-14 ENCOUNTER — Ambulatory Visit
Admission: RE | Admit: 2023-11-14 | Discharge: 2023-11-14 | Disposition: A | Discharge status: Home / Self Care | Payer: Medicare Other | Source: Ambulatory Visit | Attending: Internal Medicine | Admitting: Internal Medicine

## 2023-11-14 DIAGNOSIS — Z1231 Encounter for screening mammogram for malignant neoplasm of breast: Secondary | ICD-10-CM | POA: Diagnosis present

## 2023-11-17 ENCOUNTER — Ambulatory Visit: Payer: Medicare Other

## 2023-11-17 DIAGNOSIS — M542 Cervicalgia: Secondary | ICD-10-CM

## 2023-11-17 DIAGNOSIS — M6281 Muscle weakness (generalized): Secondary | ICD-10-CM

## 2023-11-17 NOTE — Therapy (Unsigned)
 OUTPATIENT PHYSICAL THERAPY CERVICAL TREATMENT   Patient Name: Whitney Mclean MRN: [id number] DOB:06/25/1966, 58 y.o., female Today's Date: 11/18/2023  END OF SESSION:  PT End of Session - 11/17/23 1438     Visit Number 16    Number of Visits 30    Date for PT Re-Evaluation 01/26/24    Authorization - Number of Visits 30    PT Start Time 1431    PT Stop Time 1515    PT Time Calculation (min) 44 min    Activity Tolerance Patient tolerated treatment well    Behavior During Therapy [facility] for tasks assessed/performed             Past Medical History:  Diagnosis Date   Oral cancer (HCC)    Personal history of chemotherapy    Personal history of radiation therapy    Scleroderma (HCC)    Past Surgical History:  Procedure Laterality Date   ABDOMINAL HYSTERECTOMY     Patient Active Problem List   Diagnosis Date Noted   Other secondary kyphosis, cervicothoracic region 05/19/2023   Compression fracture of T3 vertebra (HCC) 05/19/2023   Compression fracture of C7 vertebra (HCC) 05/19/2023   Osteoporosis 05/19/2023    PCP: Danella Penton, MD  REFERRING PROVIDER: Danella Penton, MD   REFERRING DIAG: S12.600A (ICD-10-CM) - Unspecified displaced fracture of seventh cervical vertebra, initial encounter for closed fracture  THERAPY DIAG:  Cervicalgia  Muscle weakness (generalized)  Rationale for Evaluation and Treatment: Rehabilitation  ONSET DATE: July 2024  SUBJECTIVE:                                                                                                                                                                                                         SUBJECTIVE STATEMENT: Pt states she is doing well. Still having mild stomach discomfort.    PERTINENT HISTORY:  Pt presents to PT w/ chronic limited cervical mobility 2/2 severe osteoporosis, scleroderma, and hx of cervical fibrosis of the neck. Since previous bout of PT patient had a fall in the restroom  resulting in three fx vertebrae, fx of six ribs, and a fx of her R shoulder blade. Pt's current fx are being managed non-surgically. Pt reports no pain or sx of N/T. She states she has the most difficulty with maintaining a seated position with no back support and is only able to maintain this position for 2-3 minutes before requiring back support. Pt also reports an increased fear of falling.   PAIN:  Are you having pain? No Main aggravation cannot sit  up without back support, increased fear of falling.  Easing factors: having a supported back 2-3 minutes of sitting unsupported before needing support  PRECAUTIONS: Fall  RED FLAGS: None     WEIGHT BEARING RESTRICTIONS: No  FALLS:  Has patient fallen in last 6 months? Yes. Number of falls 1 and near falls  LIVING ENVIRONMENT: Has following equipment at home: Walker - 2 wheeled *Pt does not use RW often  OCCUPATION: Retired, after scleroderma dx  PLOF: Independent  PATIENT GOALS: To get moving again   NEXT MD VISIT: N/A  OBJECTIVE:  Note: Objective measures were completed at Evaluation unless otherwise noted.  DIAGNOSTIC FINDINGS:  CLINICAL DATA:  The patient suffered cervical and thoracic spine compression fractures in a fall 03/25/2023. Subsequent encounter.   EXAM: MRI CERVICAL SPINE WITHOUT CONTRAST   TECHNIQUE: Multiplanar, multisequence MR imaging of the cervical spine was performed. No intravenous contrast was administered.   COMPARISON:  CT chest 04/16/2023.   FINDINGS: Alignment: No listhesis. Mild kyphosis is centered about the T1 level.   Vertebrae: The patient has a superior endplate compression fracture of C7 with vertebral body height loss of approximately 30%, a biconcave compression fracture of T1 vertebral body height loss of approximately 50% and a biconcave compression fracture T3 with vertebral body height loss centrally of approximately 80%. There is marrow edema within each of the vertebral  bodies consistent with acute or early subacute injuries. Marrow edema is also seen in the right fifth and sixth ribs due to the fractures seen on the prior CT.   Cord: Normal signal throughout the visualized cord.   Posterior Fossa, vertebral arteries, paraspinal tissues: Negative.   IMPRESSION: 1. Acute or early subacute compression fractures of C7, T1 and T3 as described above. No retropulsion off the superior endplates of C7 or T1. The central spinal canal and neural foramina are open at all levels. 2. Marrow edema in the right fifth and sixth ribs due to the fractures seen on the prior CT.  PATIENT SURVEYS:  FOTO 48/57  COGNITION: Overall cognitive status: Within functional limits for tasks assessed  SENSATION: WFL  POSTURE: rounded shoulders and forward head  PALPATION: No TTP noted    CERVICAL ROM:   Active ROM A/PROM (deg) eval  Flexion 45 deg  Extension 10 deg  Right lateral flexion 40 deg  Left lateral flexion 35 deg*  Right rotation 40 deg  Left rotation 25 deg*   (Blank rows = not tested)  All cervical PROM= AROM   UPPER EXTREMITY ROM:  Active ROM Right eval Left eval  Shoulder flexion    Shoulder extension    Shoulder abduction 100 deg   Shoulder adduction    Shoulder extension    Shoulder internal rotation C7 C7  Shoulder external rotation Mid- thoracic Mid- thoracic  Elbow flexion    Elbow extension    Wrist flexion    Wrist extension    Wrist ulnar deviation    Wrist radial deviation    Wrist pronation    Wrist supination     (Blank rows = not tested)  UPPER EXTREMITY MMT:  MMT Right eval Left eval  Shoulder flexion 4 4-  Shoulder extension    Shoulder abduction 4+ 4+  Shoulder adduction    Shoulder extension    Shoulder internal rotation 4 4  Shoulder external rotation 4 4  Middle trapezius    Lower trapezius    Elbow flexion 4+ 4+  Elbow extension 4+ 4+  Wrist flexion  Wrist extension    Wrist ulnar deviation     Wrist radial deviation    Wrist pronation    Wrist supination    Grip strength 4 4   (Blank rows = not tested)  CERVICAL SPECIAL TESTS:  Deferred 2/2 to limited cervical mobility  FUNCTIONAL TESTS:  DNF Endurance Test: Deferred to next session   SLS assessment RLE- 16 seconds LLE- 27 seconds w/ bilat UE support 07/08/23  TODAY'S TREATMENT: DATE: 11/17/23  There.Act: UBE L4 2 min forward, 2 min backward for UE and thoracic warm up. Not billed.  Standing wall angels with 2# DB, 2x8/side. Reaching 90 degrees abduction.   Standing B shoulder flexion AAROM on red physioball for shoulder mobility and cervico-thoracic extension: 2x12  STS with overhead press with 2 KG med ball, drop set. 12 reps, 10 reps, 8 reps.   Standing black TB scap retractions: 3x12   PATIENT EDUCATION:  Education details: HEP given to pt Person educated: Patient Education method: Medical illustrator Education comprehension: verbalized understanding and returned demonstration  HOME EXERCISE PROGRAM: Access Code: [email or web address] URL: [email or web address] Date: 08/12/2023 Prepared by: Ronnie Derby  Exercises - Supine Chin Tuck  - 1 x daily - 4-5 x weekly - 2 sets - 10 reps - Cervical Extension AROM with Strap  - 1 x daily - 4-5 x weekly - 2 sets - 10 reps - Seated Levator Scapulae Stretch  - 1 x daily - 4-5 x weekly - 1 sets - 2 reps - 30 hold - Seated scapular retraction   - 1 x daily - 4-5 x weekly - 2 sets - 10 reps - Standing Tandem Balance with Counter Support  - 1 x daily - 3-4 x weekly - 1 sets - 3 reps - 30 hold - Standing Single Leg Stance with Counter Support  - 1 x daily - 3-4 x weekly - 1 sets - 3 reps - 30 hold   Access Code: [email or web address] URL: [email or web address] Date: 06/24/2023 Prepared by: Tomasa Hose  Exercises - Supine Chin Tuck  - 1 x daily - 4-5 x weekly - 2 sets - 10 reps - Cervical Extension AROM with Strap  - 1 x daily - 4-5 x weekly - 2  sets - 10 reps - Seated Levator Scapulae Stretch  - 1 x daily - 4-5 x weekly - 1 sets - 2 reps - 30 hold - Seated scapular retraction   - 1 x daily - 4-5 x weekly - 2 sets - 10 reps  ASSESSMENT:  CLINICAL IMPRESSION: Continuing PT POC addressing cervical, thoracic and R shoulder mobility deficits. Utilizing compound movement patterns and exercises to address mobility and strength deficits to also incorporate static/dynamic balance deficits as pt has history of falls. Pt remains easily fatigued with exercise needing regular seated rest breaks between exercises. Pt would continue to benefit from skilled PT interventions to address remaining cervico-thoracic ROM, strength and balance deficits.  OBJECTIVE IMPAIRMENTS: decreased mobility, decreased ROM, decreased strength, hypomobility, and impaired flexibility.   ACTIVITY LIMITATIONS: carrying and lifting  PARTICIPATION LIMITATIONS: community activity  PERSONAL FACTORS: Age, Past/current experiences, Time since onset of injury/illness/exacerbation, and 3+ comorbidities: scleroderma, osteoporosis, hx of cancer  are also affecting patient's functional outcome.   REHAB POTENTIAL: Fair multi- comorbidities  CLINICAL DECISION MAKING: Evolving/moderate complexity  EVALUATION COMPLEXITY: Moderate   GOALS: Goals reviewed with patient? Yes  SHORT TERM GOALS: Target date: 12/01/23  Pt will be independent with HEP to improve cervical and shoulder strength with functional activities  Baseline: 06/24/23: HEP given to pt; 09/02/23: 25% compliance; 11/03/23: 25% compliance Goal status: ON GOING   LONG TERM GOALS: Target date: 01/26/24  Pt will improve FOTO to target score to demonstrate clinically significant improvement in functional mobility.   Baseline: 06/24/23: 46/58; 09/02/23: 47/58; 11/03/23: 50/58 Goal status: ONGOING  2.  Pt will improve all R/L shoulder MMT 4+ to improve functional mobility of bilat UE's.  Baseline: 06/24/23; 09/02/23;  11/03/23; MMT Right eval Left eval Left  09/02/23 Right 09/02/23 Right  11/03/23 Left 11/03/23  Shoulder flexion 4 4- 4- 4- 4 4+  Shoulder extension        Shoulder abduction 4+ 4+ 5 4+ 4+ 5  Shoulder adduction        Shoulder extension        Shoulder internal rotation 4 4 4+ 4+ 4 5  Shoulder external rotation 4 4 4+ 4+ 4 5   Goal status: PARTIALLY MET  3.  Pt will improve all cervical AROM to [facility] to improve functional mobility.  Baseline: 06/24/23:  Active ROM A/PROM (deg) eval A/PROM (Deg)  09/02/23 A/PROM (Deg)  11/03/23  Flexion 45 deg 45 45  Extension 10 deg 10 10  Right lateral flexion 40 deg 20 40  Left lateral flexion 35 deg* 35 40  Right rotation 40 deg 35 40  Left rotation 25 deg* 25 30   Goal status: ONGOING   PLAN:  PT FREQUENCY: 1x/week  PT DURATION: 12 weeks  PLANNED INTERVENTIONS: Therapeutic exercises, Therapeutic activity, Neuromuscular re-education, Balance training, Gait training, Patient/Family education, Self Care, Joint mobilization, Spinal manipulation, Spinal mobilization, Cryotherapy, Moist heat, Manual therapy, and Re-evaluation  PLAN FOR NEXT SESSION: Progress cervico-thoracic strength/ shoulder strengthening. Compound movement patterns for cervicothoracic mobility and shoulder strength.  [name]. Fairly IV, PT, DPT Physical Therapist- [place]  [facility] 11/18/23, 8:20 AM

## 2023-11-25 ENCOUNTER — Ambulatory Visit: Payer: Medicare Other | Attending: Internal Medicine

## 2023-11-25 DIAGNOSIS — M6281 Muscle weakness (generalized): Secondary | ICD-10-CM | POA: Insufficient documentation

## 2023-11-25 DIAGNOSIS — M542 Cervicalgia: Secondary | ICD-10-CM | POA: Diagnosis present

## 2023-11-25 DIAGNOSIS — M25611 Stiffness of right shoulder, not elsewhere classified: Secondary | ICD-10-CM | POA: Diagnosis present

## 2023-11-25 NOTE — Therapy (Unsigned)
 OUTPATIENT PHYSICAL THERAPY CERVICAL TREATMENT   Patient Name: Whitney Mclean MRN: [id number] DOB:1965/10/22, 58 y.o., female Today's Date: 11/26/2023  END OF SESSION:  PT End of Session - 11/25/23 1520     Visit Number 17    Number of Visits 30    Date for PT Re-Evaluation 01/26/24    Authorization - Number of Visits 30    PT Start Time 1516    PT Stop Time 1600    PT Time Calculation (min) 44 min    Activity Tolerance Patient tolerated treatment well    Behavior During Therapy WFL for tasks assessed/performed             Past Medical History:  Diagnosis Date   Oral cancer (HCC)    Personal history of chemotherapy    Personal history of radiation therapy    Scleroderma (HCC)    Past Surgical History:  Procedure Laterality Date   ABDOMINAL HYSTERECTOMY     Patient Active Problem List   Diagnosis Date Noted   Other secondary kyphosis, cervicothoracic region 05/19/2023   Compression fracture of T3 vertebra (HCC) 05/19/2023   Compression fracture of C7 vertebra (HCC) 05/19/2023   Osteoporosis 05/19/2023    PCP: Danella Penton, MD  REFERRING PROVIDER: Danella Penton, MD   REFERRING DIAG: S12.600A (ICD-10-CM) - Unspecified displaced fracture of seventh cervical vertebra, initial encounter for closed fracture  THERAPY DIAG:  Cervicalgia  Muscle weakness (generalized)  Stiffness of right shoulder, not elsewhere classified  Rationale for Evaluation and Treatment: Rehabilitation  ONSET DATE: July 2024  SUBJECTIVE:                                                                                                                                                                                                         SUBJECTIVE STATEMENT: Pt states she is doing well. Her stomach is doing better than last week.   PERTINENT HISTORY:  Pt presents to PT w/ chronic limited cervical mobility 2/2 severe osteoporosis, scleroderma, and hx of cervical fibrosis of the neck.  Since previous bout of PT patient had a fall in the restroom resulting in three fx vertebrae, fx of six ribs, and a fx of her R shoulder blade. Pt's current fx are being managed non-surgically. Pt reports no pain or sx of N/T. She states she has the most difficulty with maintaining a seated position with no back support and is only able to maintain this position for 2-3 minutes before requiring back support. Pt also reports an increased fear of falling.   PAIN:  Are you having pain? No Main aggravation cannot sit up without back support, increased fear of falling.  Easing factors: having a supported back 2-3 minutes of sitting unsupported before needing support  PRECAUTIONS: Fall  RED FLAGS: None     WEIGHT BEARING RESTRICTIONS: No  FALLS:  Has patient fallen in last 6 months? Yes. Number of falls 1 and near falls  LIVING ENVIRONMENT: Has following equipment at home: Walker - 2 wheeled *Pt does not use RW often  OCCUPATION: Retired, after scleroderma dx  PLOF: Independent  PATIENT GOALS: To get moving again   NEXT MD VISIT: N/A  OBJECTIVE:  Note: Objective measures were completed at Evaluation unless otherwise noted.  DIAGNOSTIC FINDINGS:  CLINICAL DATA:  The patient suffered cervical and thoracic spine compression fractures in a fall 03/25/2023. Subsequent encounter.   EXAM: MRI CERVICAL SPINE WITHOUT CONTRAST   TECHNIQUE: Multiplanar, multisequence MR imaging of the cervical spine was performed. No intravenous contrast was administered.   COMPARISON:  CT chest 04/16/2023.   FINDINGS: Alignment: No listhesis. Mild kyphosis is centered about the T1 level.   Vertebrae: The patient has a superior endplate compression fracture of C7 with vertebral body height loss of approximately 30%, a biconcave compression fracture of T1 vertebral body height loss of approximately 50% and a biconcave compression fracture T3 with vertebral body height loss centrally of  approximately 80%. There is marrow edema within each of the vertebral bodies consistent with acute or early subacute injuries. Marrow edema is also seen in the right fifth and sixth ribs due to the fractures seen on the prior CT.   Cord: Normal signal throughout the visualized cord.   Posterior Fossa, vertebral arteries, paraspinal tissues: Negative.   IMPRESSION: 1. Acute or early subacute compression fractures of C7, T1 and T3 as described above. No retropulsion off the superior endplates of C7 or T1. The central spinal canal and neural foramina are open at all levels. 2. Marrow edema in the right fifth and sixth ribs due to the fractures seen on the prior CT.  PATIENT SURVEYS:  FOTO 48/57  COGNITION: Overall cognitive status: Within functional limits for tasks assessed  SENSATION: WFL  POSTURE: rounded shoulders and forward head  PALPATION: No TTP noted    CERVICAL ROM:   Active ROM A/PROM (deg) eval  Flexion 45 deg  Extension 10 deg  Right lateral flexion 40 deg  Left lateral flexion 35 deg*  Right rotation 40 deg  Left rotation 25 deg*   (Blank rows = not tested)  All cervical PROM= AROM   UPPER EXTREMITY ROM:  Active ROM Right eval Left eval  Shoulder flexion    Shoulder extension    Shoulder abduction 100 deg   Shoulder adduction    Shoulder extension    Shoulder internal rotation C7 C7  Shoulder external rotation Mid- thoracic Mid- thoracic  Elbow flexion    Elbow extension    Wrist flexion    Wrist extension    Wrist ulnar deviation    Wrist radial deviation    Wrist pronation    Wrist supination     (Blank rows = not tested)  UPPER EXTREMITY MMT:  MMT Right eval Left eval  Shoulder flexion 4 4-  Shoulder extension    Shoulder abduction 4+ 4+  Shoulder adduction    Shoulder extension    Shoulder internal rotation 4 4  Shoulder external rotation 4 4  Middle trapezius    Lower trapezius    Elbow flexion 4+  4+  Elbow extension  4+ 4+  Wrist flexion    Wrist extension    Wrist ulnar deviation    Wrist radial deviation    Wrist pronation    Wrist supination    Grip strength 4 4   (Blank rows = not tested)  CERVICAL SPECIAL TESTS:  Deferred 2/2 to limited cervical mobility  FUNCTIONAL TESTS:  DNF Endurance Test: Deferred to next session   SLS assessment RLE- 16 seconds LLE- 27 seconds w/ bilat UE support 07/08/23  TODAY'S TREATMENT: DATE: 11/25/23  There.Act: UBE L4 2 min forward, 2 min backward for UE and thoracic warm up. Not billed.  Standing wall angels with 2# DB, 2x10/side. Reaching 90 degrees abduction with RUE. Alternating RUE and LUE.   Planks on hands, 4x5 sec holds for core and shoulder stability.   Hip hinge SUE row in standing: 2x8/UE, CGA   STS with overhead press with 2 KG med ball, drop set. 12 reps, 10 reps, 8 reps.    PATIENT EDUCATION:  Education details: HEP given to pt Person educated: Patient Education method: Medical illustrator Education comprehension: verbalized understanding and returned demonstration  HOME EXERCISE PROGRAM: Access Code: [email or web address] URL: [email or web address] Date: 08/12/2023 Prepared by: Ronnie Derby  Exercises - Supine Chin Tuck  - 1 x daily - 4-5 x weekly - 2 sets - 10 reps - Cervical Extension AROM with Strap  - 1 x daily - 4-5 x weekly - 2 sets - 10 reps - Seated Levator Scapulae Stretch  - 1 x daily - 4-5 x weekly - 1 sets - 2 reps - 30 hold - Seated scapular retraction   - 1 x daily - 4-5 x weekly - 2 sets - 10 reps - Standing Tandem Balance with Counter Support  - 1 x daily - 3-4 x weekly - 1 sets - 3 reps - 30 hold - Standing Single Leg Stance with Counter Support  - 1 x daily - 3-4 x weekly - 1 sets - 3 reps - 30 hold   Access Code: [email or web address] URL: [email or web address] Date: 06/24/2023 Prepared by: Tomasa Hose  Exercises - Supine Chin Tuck  - 1 x daily - 4-5 x weekly - 2 sets - 10 reps - Cervical  Extension AROM with Strap  - 1 x daily - 4-5 x weekly - 2 sets - 10 reps - Seated Levator Scapulae Stretch  - 1 x daily - 4-5 x weekly - 1 sets - 2 reps - 30 hold - Seated scapular retraction   - 1 x daily - 4-5 x weekly - 2 sets - 10 reps  ASSESSMENT:  CLINICAL IMPRESSION: Continuing PT POC addressing cervical, thoracic and R shoulder mobility deficits. Incorporating planking positions and other compound movement patterns to work on core and RTC stability for improved RUE use. Pt does demo improved RUE use with alternating UE use versus BUE use indicative of core weakness. Pt remains easily fatigued with exercise needing regular seated rest breaks between exercises. Pt would continue to benefit from skilled PT interventions to address remaining cervico-thoracic ROM, strength and balance deficits.  OBJECTIVE IMPAIRMENTS: decreased mobility, decreased ROM, decreased strength, hypomobility, and impaired flexibility.   ACTIVITY LIMITATIONS: carrying and lifting  PARTICIPATION LIMITATIONS: community activity  PERSONAL FACTORS: Age, Past/current experiences, Time since onset of injury/illness/exacerbation, and 3+ comorbidities: scleroderma, osteoporosis, hx of cancer  are also affecting patient's functional outcome.   REHAB POTENTIAL: Fair multi- comorbidities  CLINICAL DECISION MAKING: Evolving/moderate complexity  EVALUATION COMPLEXITY: Moderate  GOALS: Goals reviewed with patient? Yes  SHORT TERM GOALS: Target date: 12/01/23  Pt will be independent with HEP to improve cervical and shoulder strength with functional activities   Baseline: 06/24/23: HEP given to pt; 09/02/23: 25% compliance; 11/03/23: 25% compliance Goal status: ON GOING   LONG TERM GOALS: Target date: 01/26/24  Pt will improve FOTO to target score to demonstrate clinically significant improvement in functional mobility.   Baseline: 06/24/23: 46/58; 09/02/23: 47/58; 11/03/23: 50/58 Goal status: ONGOING  2.  Pt will  improve all R/L shoulder MMT 4+ to improve functional mobility of bilat UE's.  Baseline: 06/24/23; 09/02/23; 11/03/23; MMT Right eval Left eval Left  09/02/23 Right 09/02/23 Right  11/03/23 Left 11/03/23  Shoulder flexion 4 4- 4- 4- 4 4+  Shoulder extension        Shoulder abduction 4+ 4+ 5 4+ 4+ 5  Shoulder adduction        Shoulder extension        Shoulder internal rotation 4 4 4+ 4+ 4 5  Shoulder external rotation 4 4 4+ 4+ 4 5   Goal status: PARTIALLY MET  3.  Pt will improve all cervical AROM to [facility] to improve functional mobility.  Baseline: 06/24/23:  Active ROM A/PROM (deg) eval A/PROM (Deg)  09/02/23 A/PROM (Deg)  11/03/23  Flexion 45 deg 45 45  Extension 10 deg 10 10  Right lateral flexion 40 deg 20 40  Left lateral flexion 35 deg* 35 40  Right rotation 40 deg 35 40  Left rotation 25 deg* 25 30   Goal status: ONGOING   PLAN:  PT FREQUENCY: 1x/week  PT DURATION: 12 weeks  PLANNED INTERVENTIONS: Therapeutic exercises, Therapeutic activity, Neuromuscular re-education, Balance training, Gait training, Patient/Family education, Self Care, Joint mobilization, Spinal manipulation, Spinal mobilization, Cryotherapy, Moist heat, Manual therapy, and Re-evaluation  PLAN FOR NEXT SESSION: Progress cervico-thoracic strength/ shoulder strengthening. Compound movement patterns for cervicothoracic mobility and shoulder strength.  [name]. Fairly IV, PT, DPT Physical Therapist- [place]  [facility] 11/26/23, 8:00 AM

## 2023-12-02 ENCOUNTER — Ambulatory Visit: Payer: Medicare Other

## 2023-12-02 DIAGNOSIS — M25611 Stiffness of right shoulder, not elsewhere classified: Secondary | ICD-10-CM

## 2023-12-02 DIAGNOSIS — M6281 Muscle weakness (generalized): Secondary | ICD-10-CM

## 2023-12-02 DIAGNOSIS — M542 Cervicalgia: Secondary | ICD-10-CM

## 2023-12-02 NOTE — Therapy (Signed)
 OUTPATIENT PHYSICAL THERAPY CERVICAL TREATMENT   Patient Name: Whitney Mclean MRN: [id number] DOB:[birth year], 58 y.o., female Today's Date: 12/02/2023  END OF SESSION:  PT End of Session - 12/02/23 1518     Visit Number 18    Number of Visits 30    Date for PT Re-Evaluation 01/26/24    Authorization - Number of Visits 30    PT Start Time 1512    PT Stop Time 1558    PT Time Calculation (min) 46 min    Activity Tolerance Patient tolerated treatment well    Behavior During Therapy WFL for tasks assessed/performed             Past Medical History:  Diagnosis Date   Oral cancer (HCC)    Personal history of chemotherapy    Personal history of radiation therapy    Scleroderma (HCC)    Past Surgical History:  Procedure Laterality Date   ABDOMINAL HYSTERECTOMY     Patient Active Problem List   Diagnosis Date Noted   Other secondary kyphosis, cervicothoracic region 05/19/2023   Compression fracture of T3 vertebra (HCC) 05/19/2023   Compression fracture of C7 vertebra (HCC) 05/19/2023   Osteoporosis 05/19/2023    PCP: Danella Penton, MD  REFERRING PROVIDER: Danella Penton, MD   REFERRING DIAG: S12.600A (ICD-10-CM) - Unspecified displaced fracture of seventh cervical vertebra, initial encounter for closed fracture  THERAPY DIAG:  Cervicalgia  Muscle weakness (generalized)  Stiffness of right shoulder, not elsewhere classified  Rationale for Evaluation and Treatment: Rehabilitation  ONSET DATE: July 2024  SUBJECTIVE:                                                                                                                                                                                                         SUBJECTIVE STATEMENT: Pt states she is continuing to do well.   PERTINENT HISTORY:  Pt presents to PT w/ chronic limited cervical mobility 2/2 severe osteoporosis, scleroderma, and hx of cervical fibrosis of the neck. Since previous bout of PT  patient had a fall in the restroom resulting in three fx vertebrae, fx of six ribs, and a fx of her R shoulder blade. Pt's current fx are being managed non-surgically. Pt reports no pain or sx of N/T. She states she has the most difficulty with maintaining a seated position with no back support and is only able to maintain this position for 2-3 minutes before requiring back support. Pt also reports an increased fear of falling.   PAIN:  Are you having pain? No  Main aggravation cannot sit up without back support, increased fear of falling.  Easing factors: having a supported back 2-3 minutes of sitting unsupported before needing support  PRECAUTIONS: Fall  RED FLAGS: None     WEIGHT BEARING RESTRICTIONS: No  FALLS:  Has patient fallen in last 6 months? Yes. Number of falls 1 and near falls  LIVING ENVIRONMENT: Has following equipment at home: Walker - 2 wheeled *Pt does not use RW often  OCCUPATION: Retired, after scleroderma dx  PLOF: Independent  PATIENT GOALS: To get moving again   NEXT MD VISIT: N/A  OBJECTIVE:  Note: Objective measures were completed at Evaluation unless otherwise noted.  DIAGNOSTIC FINDINGS:  CLINICAL DATA:  The patient suffered cervical and thoracic spine compression fractures in a fall 03/25/2023. Subsequent encounter.   EXAM: MRI CERVICAL SPINE WITHOUT CONTRAST   TECHNIQUE: Multiplanar, multisequence MR imaging of the cervical spine was performed. No intravenous contrast was administered.   COMPARISON:  CT chest 04/16/2023.   FINDINGS: Alignment: No listhesis. Mild kyphosis is centered about the T1 level.   Vertebrae: The patient has a superior endplate compression fracture of C7 with vertebral body height loss of approximately 30%, a biconcave compression fracture of T1 vertebral body height loss of approximately 50% and a biconcave compression fracture T3 with vertebral body height loss centrally of approximately 80%. There is marrow  edema within each of the vertebral bodies consistent with acute or early subacute injuries. Marrow edema is also seen in the right fifth and sixth ribs due to the fractures seen on the prior CT.   Cord: Normal signal throughout the visualized cord.   Posterior Fossa, vertebral arteries, paraspinal tissues: Negative.   IMPRESSION: 1. Acute or early subacute compression fractures of C7, T1 and T3 as described above. No retropulsion off the superior endplates of C7 or T1. The central spinal canal and neural foramina are open at all levels. 2. Marrow edema in the right fifth and sixth ribs due to the fractures seen on the prior CT.  PATIENT SURVEYS:  FOTO 48/57  COGNITION: Overall cognitive status: Within functional limits for tasks assessed  SENSATION: WFL  POSTURE: rounded shoulders and forward head  PALPATION: No TTP noted    CERVICAL ROM:   Active ROM A/PROM (deg) eval  Flexion 45 deg  Extension 10 deg  Right lateral flexion 40 deg  Left lateral flexion 35 deg*  Right rotation 40 deg  Left rotation 25 deg*   (Blank rows = not tested)  All cervical PROM= AROM   UPPER EXTREMITY ROM:  Active ROM Right eval Left eval  Shoulder flexion    Shoulder extension    Shoulder abduction 100 deg   Shoulder adduction    Shoulder extension    Shoulder internal rotation C7 C7  Shoulder external rotation Mid- thoracic Mid- thoracic  Elbow flexion    Elbow extension    Wrist flexion    Wrist extension    Wrist ulnar deviation    Wrist radial deviation    Wrist pronation    Wrist supination     (Blank rows = not tested)  UPPER EXTREMITY MMT:  MMT Right eval Left eval  Shoulder flexion 4 4-  Shoulder extension    Shoulder abduction 4+ 4+  Shoulder adduction    Shoulder extension    Shoulder internal rotation 4 4  Shoulder external rotation 4 4  Middle trapezius    Lower trapezius    Elbow flexion 4+ 4+  Elbow extension 4+  4+  Wrist flexion    Wrist  extension    Wrist ulnar deviation    Wrist radial deviation    Wrist pronation    Wrist supination    Grip strength 4 4   (Blank rows = not tested)  CERVICAL SPECIAL TESTS:  Deferred 2/2 to limited cervical mobility  FUNCTIONAL TESTS:  DNF Endurance Test: Deferred to next session   SLS assessment RLE- 16 seconds LLE- 27 seconds w/ bilat UE support 07/08/23  TODAY'S TREATMENT: DATE: 12/02/23  There.Ex:   5 minutes supine for L cervical rotation, L lateral flexion, cervical extension. 3x30 sec/direction with PT OP.   Red physioball overhead raises shoulder AAROM with 3# AW's at wrists: x12    There.Act: UBE L5 2 min forward, 2 min backward for UE and thoracic warm up. Not billed.  Standing wall angels with 2# DB, 2x10/side. Reaching 90 degrees abduction with RUE. Alternating RUE and LUE. VC's to limit truncal leaning compensation   Planks on hands, 4x8 sec holds for core and shoulder stability.   Lunge SUE row: 5# DB, 3x8/side, mod TC's on shoulder blade for protraction and retraction. Good carryover     PATIENT EDUCATION:  Education details: HEP given to pt Person educated: Patient Education method: Medical illustrator Education comprehension: verbalized understanding and returned demonstration  HOME EXERCISE PROGRAM: Access Code: [email or web address] URL: [email or web address] Date: 08/12/2023 Prepared by: Ronnie Derby  Exercises - Supine Chin Tuck  - 1 x daily - 4-5 x weekly - 2 sets - 10 reps - Cervical Extension AROM with Strap  - 1 x daily - 4-5 x weekly - 2 sets - 10 reps - Seated Levator Scapulae Stretch  - 1 x daily - 4-5 x weekly - 1 sets - 2 reps - 30 hold - Seated scapular retraction   - 1 x daily - 4-5 x weekly - 2 sets - 10 reps - Standing Tandem Balance with Counter Support  - 1 x daily - 3-4 x weekly - 1 sets - 3 reps - 30 hold - Standing Single Leg Stance with Counter Support  - 1 x daily - 3-4 x weekly - 1 sets - 3 reps - 30  hold   Access Code: [email or web address] URL: [email or web address] Date: 06/24/2023 Prepared by: Tomasa Hose  Exercises - Supine Chin Tuck  - 1 x daily - 4-5 x weekly - 2 sets - 10 reps - Cervical Extension AROM with Strap  - 1 x daily - 4-5 x weekly - 2 sets - 10 reps - Seated Levator Scapulae Stretch  - 1 x daily - 4-5 x weekly - 1 sets - 2 reps - 30 hold - Seated scapular retraction   - 1 x daily - 4-5 x weekly - 2 sets - 10 reps  ASSESSMENT:  CLINICAL IMPRESSION: Continuing PT POC addressing cervical, thoracic and R shoulder mobility deficits. Incorporating planking positions and other compound movement patterns to work on core and RTC stability for improved RUE use. Re-introduced passive cervical mobility today as well as pt reports slowed decline in  chronic cervical mobility deficits from her scleroderma. PT to plan to re-introduce cervical mobility. Pt would continue to benefit from skilled PT interventions to address remaining cervico-thoracic ROM, strength and balance deficits.  OBJECTIVE IMPAIRMENTS: decreased mobility, decreased ROM, decreased strength, hypomobility, and impaired flexibility.   ACTIVITY LIMITATIONS: carrying and lifting  PARTICIPATION LIMITATIONS: community activity  PERSONAL FACTORS: Age, Past/current experiences, Time since onset of injury/illness/exacerbation, and 3+ comorbidities: scleroderma, osteoporosis, hx  of cancer  are also affecting patient's functional outcome.   REHAB POTENTIAL: Fair multi- comorbidities  CLINICAL DECISION MAKING: Evolving/moderate complexity  EVALUATION COMPLEXITY: Moderate   GOALS: Goals reviewed with patient? Yes  SHORT TERM GOALS: Target date: 12/01/23  Pt will be independent with HEP to improve cervical and shoulder strength with functional activities   Baseline: 06/24/23: HEP given to pt; 09/02/23: 25% compliance; 11/03/23: 25% compliance Goal status: ON GOING   LONG TERM GOALS: Target date: 01/26/24  Pt  will improve FOTO to target score to demonstrate clinically significant improvement in functional mobility.   Baseline: 06/24/23: 46/58; 09/02/23: 47/58; 11/03/23: 50/58 Goal status: ONGOING  2.  Pt will improve all R/L shoulder MMT 4+ to improve functional mobility of bilat UE's.  Baseline: 06/24/23; 09/02/23; 11/03/23; MMT Right eval Left eval Left  09/02/23 Right 09/02/23 Right  11/03/23 Left 11/03/23  Shoulder flexion 4 4- 4- 4- 4 4+  Shoulder extension        Shoulder abduction 4+ 4+ 5 4+ 4+ 5  Shoulder adduction        Shoulder extension        Shoulder internal rotation 4 4 4+ 4+ 4 5  Shoulder external rotation 4 4 4+ 4+ 4 5   Goal status: PARTIALLY MET  3.  Pt will improve all cervical AROM to [facility] to improve functional mobility.  Baseline: 06/24/23:  Active ROM A/PROM (deg) eval A/PROM (Deg)  09/02/23 A/PROM (Deg)  11/03/23  Flexion 45 deg 45 45  Extension 10 deg 10 10  Right lateral flexion 40 deg 20 40  Left lateral flexion 35 deg* 35 40  Right rotation 40 deg 35 40  Left rotation 25 deg* 25 30   Goal status: ONGOING   PLAN:  PT FREQUENCY: 1x/week  PT DURATION: 12 weeks  PLANNED INTERVENTIONS: Therapeutic exercises, Therapeutic activity, Neuromuscular re-education, Balance training, Gait training, Patient/Family education, Self Care, Joint mobilization, Spinal manipulation, Spinal mobilization, Cryotherapy, Moist heat, Manual therapy, and Re-evaluation  PLAN FOR NEXT SESSION: Cervical manual therapy. Progress cervico-thoracic strength/ shoulder strengthening. Compound movement patterns for cervicothoracic mobility and shoulder strength.  [name]. Fairly IV, PT, DPT Physical Therapist-   [facility] 12/02/23, 4:50 PM

## 2023-12-09 ENCOUNTER — Ambulatory Visit: Payer: Medicare Other

## 2023-12-11 ENCOUNTER — Ambulatory Visit

## 2023-12-11 DIAGNOSIS — M542 Cervicalgia: Secondary | ICD-10-CM

## 2023-12-11 DIAGNOSIS — M25611 Stiffness of right shoulder, not elsewhere classified: Secondary | ICD-10-CM

## 2023-12-11 DIAGNOSIS — M6281 Muscle weakness (generalized): Secondary | ICD-10-CM

## 2023-12-11 NOTE — Therapy (Signed)
 OUTPATIENT PHYSICAL THERAPY CERVICAL TREATMENT   Patient Name: Whitney Mclean MRN: [id number] DOB:September 07, 1966, 58 y.o., female Today's Date: 12/11/2023  END OF SESSION:  PT End of Session - 12/11/23 1348     Visit Number 19    Number of Visits 30    Date for PT Re-Evaluation 01/26/24    Authorization - Number of Visits 30    PT Start Time 1345    PT Stop Time 1430    PT Time Calculation (min) 45 min    Activity Tolerance Patient tolerated treatment well    Behavior During Therapy WFL for tasks assessed/performed             Past Medical History:  Diagnosis Date   Oral cancer (HCC)    Personal history of chemotherapy    Personal history of radiation therapy    Scleroderma (HCC)    Past Surgical History:  Procedure Laterality Date   ABDOMINAL HYSTERECTOMY     Patient Active Problem List   Diagnosis Date Noted   Other secondary kyphosis, cervicothoracic region 05/19/2023   Compression fracture of T3 vertebra (HCC) 05/19/2023   Compression fracture of C7 vertebra (HCC) 05/19/2023   Osteoporosis 05/19/2023    PCP: Danella Penton, MD  REFERRING PROVIDER: Danella Penton, MD   REFERRING DIAG: S12.600A (ICD-10-CM) - Unspecified displaced fracture of seventh cervical vertebra, initial encounter for closed fracture  THERAPY DIAG:  Cervicalgia  Muscle weakness (generalized)  Stiffness of right shoulder, not elsewhere classified  Rationale for Evaluation and Treatment: Rehabilitation  ONSET DATE: July 2024  SUBJECTIVE:                                                                                                                                                                                                         SUBJECTIVE STATEMENT: Pt reports a lingering cough but is feeling better.  PERTINENT HISTORY:  Pt presents to PT w/ chronic limited cervical mobility 2/2 severe osteoporosis, scleroderma, and hx of cervical fibrosis of the neck. Since previous bout  of PT patient had a fall in the restroom resulting in three fx vertebrae, fx of six ribs, and a fx of her R shoulder blade. Pt's current fx are being managed non-surgically. Pt reports no pain or sx of N/T. She states she has the most difficulty with maintaining a seated position with no back support and is only able to maintain this position for 2-3 minutes before requiring back support. Pt also reports an increased fear of falling.   PAIN:  Are you having pain? No  Main aggravation cannot sit up without back support, increased fear of falling.  Easing factors: having a supported back 2-3 minutes of sitting unsupported before needing support  PRECAUTIONS: Fall  RED FLAGS: None     WEIGHT BEARING RESTRICTIONS: No  FALLS:  Has patient fallen in last 6 months? Yes. Number of falls 1 and near falls  LIVING ENVIRONMENT: Has following equipment at home: Walker - 2 wheeled *Pt does not use RW often  OCCUPATION: Retired, after scleroderma dx  PLOF: Independent  PATIENT GOALS: To get moving again   NEXT MD VISIT: N/A  OBJECTIVE:  Note: Objective measures were completed at Evaluation unless otherwise noted.  DIAGNOSTIC FINDINGS:  CLINICAL DATA:  The patient suffered cervical and thoracic spine compression fractures in a fall 03/25/2023. Subsequent encounter.   EXAM: MRI CERVICAL SPINE WITHOUT CONTRAST   TECHNIQUE: Multiplanar, multisequence MR imaging of the cervical spine was performed. No intravenous contrast was administered.   COMPARISON:  CT chest 04/16/2023.   FINDINGS: Alignment: No listhesis. Mild kyphosis is centered about the T1 level.   Vertebrae: The patient has a superior endplate compression fracture of C7 with vertebral body height loss of approximately 30%, a biconcave compression fracture of T1 vertebral body height loss of approximately 50% and a biconcave compression fracture T3 with vertebral body height loss centrally of approximately 80%. There  is marrow edema within each of the vertebral bodies consistent with acute or early subacute injuries. Marrow edema is also seen in the right fifth and sixth ribs due to the fractures seen on the prior CT.   Cord: Normal signal throughout the visualized cord.   Posterior Fossa, vertebral arteries, paraspinal tissues: Negative.   IMPRESSION: 1. Acute or early subacute compression fractures of C7, T1 and T3 as described above. No retropulsion off the superior endplates of C7 or T1. The central spinal canal and neural foramina are open at all levels. 2. Marrow edema in the right fifth and sixth ribs due to the fractures seen on the prior CT.  PATIENT SURVEYS:  FOTO 48/57  COGNITION: Overall cognitive status: Within functional limits for tasks assessed  SENSATION: WFL  POSTURE: rounded shoulders and forward head  PALPATION: No TTP noted    CERVICAL ROM:   Active ROM A/PROM (deg) eval  Flexion 45 deg  Extension 10 deg  Right lateral flexion 40 deg  Left lateral flexion 35 deg*  Right rotation 40 deg  Left rotation 25 deg*   (Blank rows = not tested)  All cervical PROM= AROM   UPPER EXTREMITY ROM:  Active ROM Right eval Left eval  Shoulder flexion    Shoulder extension    Shoulder abduction 100 deg   Shoulder adduction    Shoulder extension    Shoulder internal rotation C7 C7  Shoulder external rotation Mid- thoracic Mid- thoracic  Elbow flexion    Elbow extension    Wrist flexion    Wrist extension    Wrist ulnar deviation    Wrist radial deviation    Wrist pronation    Wrist supination     (Blank rows = not tested)  UPPER EXTREMITY MMT:  MMT Right eval Left eval  Shoulder flexion 4 4-  Shoulder extension    Shoulder abduction 4+ 4+  Shoulder adduction    Shoulder extension    Shoulder internal rotation 4 4  Shoulder external rotation 4 4  Middle trapezius    Lower trapezius    Elbow flexion 4+ 4+  Elbow extension 4+  4+  Wrist flexion     Wrist extension    Wrist ulnar deviation    Wrist radial deviation    Wrist pronation    Wrist supination    Grip strength 4 4   (Blank rows = not tested)  CERVICAL SPECIAL TESTS:  Deferred 2/2 to limited cervical mobility  FUNCTIONAL TESTS:  DNF Endurance Test: Deferred to next session   SLS assessment RLE- 16 seconds LLE- 27 seconds w/ bilat UE support 07/08/23  TODAY'S TREATMENT: DATE: 12/11/23  There.Ex:  UBE L5 2 min forward, 2 min backward for UE and thoracic warm up. Not billed.   10 minutes total in supine for L cervical rotation, L lateral flexion, cervical extension. 5x30 sec/direction with PT OP.      There.Act: Lunge SUE row: 6# DB, 3x8/side, mod TC's on shoulder blade for protraction and retraction. Good carryover  Landmine Press with PVC (5# AW at top of PVC), x 8 BUE alternating shoulder in staggered stance. CGA   Red physioball overhead raises shoulder AAROM with 5# AW's at LUE, 3#AW at RUE 2x8.      PATIENT EDUCATION:  Education details: HEP given to pt Person educated: Patient Education method: Medical illustrator Education comprehension: verbalized understanding and returned demonstration  HOME EXERCISE PROGRAM: Access Code: [email or web address] URL: [email or web address] Date: 08/12/2023 Prepared by: Ronnie Derby  Exercises - Supine Chin Tuck  - 1 x daily - 4-5 x weekly - 2 sets - 10 reps - Cervical Extension AROM with Strap  - 1 x daily - 4-5 x weekly - 2 sets - 10 reps - Seated Levator Scapulae Stretch  - 1 x daily - 4-5 x weekly - 1 sets - 2 reps - 30 hold - Seated scapular retraction   - 1 x daily - 4-5 x weekly - 2 sets - 10 reps - Standing Tandem Balance with Counter Support  - 1 x daily - 3-4 x weekly - 1 sets - 3 reps - 30 hold - Standing Single Leg Stance with Counter Support  - 1 x daily - 3-4 x weekly - 1 sets - 3 reps - 30 hold   Access Code: [email or web address] URL: [email or web address] Date:  06/24/2023 Prepared by: Tomasa Hose  Exercises - Supine Chin Tuck  - 1 x daily - 4-5 x weekly - 2 sets - 10 reps - Cervical Extension AROM with Strap  - 1 x daily - 4-5 x weekly - 2 sets - 10 reps - Seated Levator Scapulae Stretch  - 1 x daily - 4-5 x weekly - 1 sets - 2 reps - 30 hold - Seated scapular retraction   - 1 x daily - 4-5 x weekly - 2 sets - 10 reps  ASSESSMENT:  CLINICAL IMPRESSION: Continuing PT POC addressing cervical, thoracic and R shoulder mobility deficits. Returning to addressing cervical stretches and mobility due to subjective reports of decreased cervical mobility. Pt continues to demonstrate improved tolerance for resisted periscapular exercises involving compound movement patterns that encourage core activation and dynamic balance for RUE use with ADL's. Pt would continue to benefit from skilled PT interventions to address remaining cervico-thoracic ROM, strength and balance deficits.  OBJECTIVE IMPAIRMENTS: decreased mobility, decreased ROM, decreased strength, hypomobility, and impaired flexibility.   ACTIVITY LIMITATIONS: carrying and lifting  PARTICIPATION LIMITATIONS: community activity  PERSONAL FACTORS: Age, Past/current experiences, Time since onset of injury/illness/exacerbation, and 3+ comorbidities: scleroderma, osteoporosis, hx of cancer  are also affecting patient's functional outcome.   REHAB POTENTIAL: Fair multi- comorbidities  CLINICAL DECISION MAKING: Evolving/moderate complexity  EVALUATION COMPLEXITY: Moderate   GOALS: Goals reviewed with patient? Yes  SHORT TERM GOALS: Target date: 12/01/23  Pt will be independent with HEP to improve cervical and shoulder strength with functional activities   Baseline: 06/24/23: HEP given to pt; 09/02/23: 25% compliance; 11/03/23: 25% compliance Goal status: ON GOING   LONG TERM GOALS: Target date: 01/26/24  Pt will improve FOTO to target score to demonstrate clinically significant improvement in  functional mobility.   Baseline: 06/24/23: 46/58; 09/02/23: 47/58; 11/03/23: 50/58 Goal status: ONGOING  2.  Pt will improve all R/L shoulder MMT 4+ to improve functional mobility of bilat UE's.  Baseline: 06/24/23; 09/02/23; 11/03/23; MMT Right eval Left eval Left  09/02/23 Right 09/02/23 Right  11/03/23 Left 11/03/23  Shoulder flexion 4 4- 4- 4- 4 4+  Shoulder extension        Shoulder abduction 4+ 4+ 5 4+ 4+ 5  Shoulder adduction        Shoulder extension        Shoulder internal rotation 4 4 4+ 4+ 4 5  Shoulder external rotation 4 4 4+ 4+ 4 5   Goal status: PARTIALLY MET  3.  Pt will improve all cervical AROM to [facility] to improve functional mobility.  Baseline: 06/24/23:  Active ROM A/PROM (deg) eval A/PROM (Deg)  09/02/23 A/PROM (Deg)  11/03/23  Flexion 45 deg 45 45  Extension 10 deg 10 10  Right lateral flexion 40 deg 20 40  Left lateral flexion 35 deg* 35 40  Right rotation 40 deg 35 40  Left rotation 25 deg* 25 30   Goal status: ONGOING   PLAN:  PT FREQUENCY: 1x/week  PT DURATION: 12 weeks  PLANNED INTERVENTIONS: Therapeutic exercises, Therapeutic activity, Neuromuscular re-education, Balance training, Gait training, Patient/Family education, Self Care, Joint mobilization, Spinal manipulation, Spinal mobilization, Cryotherapy, Moist heat, Manual therapy, and Re-evaluation  PLAN FOR NEXT SESSION: Cervical manual therapy. Progress cervico-thoracic strength/ shoulder strengthening. Compound movement patterns for cervicothoracic mobility and shoulder strength.  [name]. Fairly IV, PT, DPT Physical Therapist-   [facility] 12/11/23, 2:56 PM

## 2023-12-16 ENCOUNTER — Ambulatory Visit: Payer: Medicare Other

## 2023-12-16 DIAGNOSIS — M542 Cervicalgia: Secondary | ICD-10-CM | POA: Diagnosis not present

## 2023-12-16 DIAGNOSIS — M6281 Muscle weakness (generalized): Secondary | ICD-10-CM

## 2023-12-16 DIAGNOSIS — M25611 Stiffness of right shoulder, not elsewhere classified: Secondary | ICD-10-CM

## 2023-12-16 NOTE — Therapy (Unsigned)
 OUTPATIENT PHYSICAL THERAPY CERVICAL TREATMENT/PROGRESS NOTE  Dates of Reporting Period: 09/23/23 - 12/16/23   Patient Name: Whitney Mclean MRN: [id number] DOB:1966/05/27, 58 y.o., female Today's Date: 12/17/2023  END OF SESSION:  PT End of Session - 12/16/23 1519     Visit Number 20    Number of Visits 30    Date for PT Re-Evaluation 01/26/24    Authorization - Number of Visits 30    PT Start Time 1515    PT Stop Time 1600    PT Time Calculation (min) 45 min    Activity Tolerance Patient tolerated treatment well    Behavior During Therapy [facility] for tasks assessed/performed             Past Medical History:  Diagnosis Date   Oral cancer (HCC)    Personal history of chemotherapy    Personal history of radiation therapy    Scleroderma (HCC)    Past Surgical History:  Procedure Laterality Date   ABDOMINAL HYSTERECTOMY     Patient Active Problem List   Diagnosis Date Noted   Other secondary kyphosis, cervicothoracic region 05/19/2023   Compression fracture of T3 vertebra (HCC) 05/19/2023   Compression fracture of C7 vertebra (HCC) 05/19/2023   Osteoporosis 05/19/2023    PCP: Danella Penton, MD  REFERRING PROVIDER: Danella Penton, MD   REFERRING DIAG: S12.600A (ICD-10-CM) - Unspecified displaced fracture of seventh cervical vertebra, initial encounter for closed fracture  THERAPY DIAG:  Cervicalgia  Muscle weakness (generalized)  Stiffness of right shoulder, not elsewhere classified  Rationale for Evaluation and Treatment: Rehabilitation  ONSET DATE: July 2024  SUBJECTIVE:                                                                                                                                                                                                         SUBJECTIVE STATEMENT: Pt reports she is doing well. No new concerns.  PERTINENT HISTORY:  Pt presents to PT w/ chronic limited cervical mobility 2/2 severe osteoporosis, scleroderma, and  hx of cervical fibrosis of the neck. Since previous bout of PT patient had a fall in the restroom resulting in three fx vertebrae, fx of six ribs, and a fx of her R shoulder blade. Pt's current fx are being managed non-surgically. Pt reports no pain or sx of N/T. She states she has the most difficulty with maintaining a seated position with no back support and is only able to maintain this position for 2-3 minutes before requiring back support. Pt also reports an increased fear of falling.  PAIN:  Are you having pain? No Main aggravation cannot sit up without back support, increased fear of falling.  Easing factors: having a supported back 2-3 minutes of sitting unsupported before needing support  PRECAUTIONS: Fall  RED FLAGS: None     WEIGHT BEARING RESTRICTIONS: No  FALLS:  Has patient fallen in last 6 months? Yes. Number of falls 1 and near falls  LIVING ENVIRONMENT: Has following equipment at home: Walker - 2 wheeled *Pt does not use RW often  OCCUPATION: Retired, after scleroderma dx  PLOF: Independent  PATIENT GOALS: To get moving again   NEXT MD VISIT: N/A  OBJECTIVE:  Note: Objective measures were completed at Evaluation unless otherwise noted.  DIAGNOSTIC FINDINGS:  CLINICAL DATA:  The patient suffered cervical and thoracic spine compression fractures in a fall 03/25/2023. Subsequent encounter.   EXAM: MRI CERVICAL SPINE WITHOUT CONTRAST   TECHNIQUE: Multiplanar, multisequence MR imaging of the cervical spine was performed. No intravenous contrast was administered.   COMPARISON:  CT chest 04/16/2023.   FINDINGS: Alignment: No listhesis. Mild kyphosis is centered about the T1 level.   Vertebrae: The patient has a superior endplate compression fracture of C7 with vertebral body height loss of approximately 30%, a biconcave compression fracture of T1 vertebral body height loss of approximately 50% and a biconcave compression fracture T3 with vertebral  body height loss centrally of approximately 80%. There is marrow edema within each of the vertebral bodies consistent with acute or early subacute injuries. Marrow edema is also seen in the right fifth and sixth ribs due to the fractures seen on the prior CT.   Cord: Normal signal throughout the visualized cord.   Posterior Fossa, vertebral arteries, paraspinal tissues: Negative.   IMPRESSION: 1. Acute or early subacute compression fractures of C7, T1 and T3 as described above. No retropulsion off the superior endplates of C7 or T1. The central spinal canal and neural foramina are open at all levels. 2. Marrow edema in the right fifth and sixth ribs due to the fractures seen on the prior CT.  PATIENT SURVEYS:  FOTO 48/57  COGNITION: Overall cognitive status: Within functional limits for tasks assessed  SENSATION: WFL  POSTURE: rounded shoulders and forward head  PALPATION: No TTP noted    CERVICAL ROM:   Active ROM A/PROM (deg) eval  Flexion 45 deg  Extension 10 deg  Right lateral flexion 40 deg  Left lateral flexion 35 deg*  Right rotation 40 deg  Left rotation 25 deg*   (Blank rows = not tested)  All cervical PROM= AROM   UPPER EXTREMITY ROM:  Active ROM Right eval Left eval  Shoulder flexion    Shoulder extension    Shoulder abduction 100 deg   Shoulder adduction    Shoulder extension    Shoulder internal rotation C7 C7  Shoulder external rotation Mid- thoracic Mid- thoracic  Elbow flexion    Elbow extension    Wrist flexion    Wrist extension    Wrist ulnar deviation    Wrist radial deviation    Wrist pronation    Wrist supination     (Blank rows = not tested)  UPPER EXTREMITY MMT:  MMT Right eval Left eval  Shoulder flexion 4 4-  Shoulder extension    Shoulder abduction 4+ 4+  Shoulder adduction    Shoulder extension    Shoulder internal rotation 4 4  Shoulder external rotation 4 4  Middle trapezius    Lower trapezius    Elbow  flexion 4+ 4+  Elbow extension 4+ 4+  Wrist flexion    Wrist extension    Wrist ulnar deviation    Wrist radial deviation    Wrist pronation    Wrist supination    Grip strength 4 4   (Blank rows = not tested)  CERVICAL SPECIAL TESTS:  Deferred 2/2 to limited cervical mobility  FUNCTIONAL TESTS:  DNF Endurance Test: Deferred to next session   SLS assessment RLE- 16 seconds LLE- 27 seconds w/ bilat UE support 07/08/23  TODAY'S TREATMENT: DATE: 12/16/23  There.Ex:  UBE L5 2 min forward, 2 min backward for UE and thoracic warm up. Not billed.   10 minutes total in supine for L cervical rotation, L lateral flexion, cervical extension. 5x30 sec/direction with PT OP.      There.Act:  Red physioball overhead raises shoulder AAROM with 5# AW's at LUE, 3#AW at RUE 2x10.   Standing B shoulder horizontal abduction: RTB, 1x8 with difficulty with RUE getting full ROM. Regressed to YTB 2x8.   2x10 second planks for core, cervical, and shoulder stability   PATIENT EDUCATION:  Education details: HEP given to pt Person educated: Patient Education method: Medical illustrator Education comprehension: verbalized understanding and returned demonstration  HOME EXERCISE PROGRAM: Access Code: [email or web address] URL: [email or web address] Date: 08/12/2023 Prepared by: Ronnie Derby  Exercises - Supine Chin Tuck  - 1 x daily - 4-5 x weekly - 2 sets - 10 reps - Cervical Extension AROM with Strap  - 1 x daily - 4-5 x weekly - 2 sets - 10 reps - Seated Levator Scapulae Stretch  - 1 x daily - 4-5 x weekly - 1 sets - 2 reps - 30 hold - Seated scapular retraction   - 1 x daily - 4-5 x weekly - 2 sets - 10 reps - Standing Tandem Balance with Counter Support  - 1 x daily - 3-4 x weekly - 1 sets - 3 reps - 30 hold - Standing Single Leg Stance with Counter Support  - 1 x daily - 3-4 x weekly - 1 sets - 3 reps - 30 hold   Access Code: [email or web address] URL:  [email or web address] Date: 06/24/2023 Prepared by: Tomasa Hose  Exercises - Supine Chin Tuck  - 1 x daily - 4-5 x weekly - 2 sets - 10 reps - Cervical Extension AROM with Strap  - 1 x daily - 4-5 x weekly - 2 sets - 10 reps - Seated Levator Scapulae Stretch  - 1 x daily - 4-5 x weekly - 1 sets - 2 reps - 30 hold - Seated scapular retraction   - 1 x daily - 4-5 x weekly - 2 sets - 10 reps  ASSESSMENT:  CLINICAL IMPRESSION: Continuing PT POC addressing cervical, thoracic and R shoulder mobility deficits. Continuing to address cervical AROM deficits with manual interventions. Also working on core and cervical stability and compound movements patterns using resistance to address postural weakness. Pt is making progress in overall tolerance for volume of exercise with greater resistance levels. As mentioned earlier, re-initiating cervical mobility to maintain current cervical ROM from chronic limitations to ensure adequate ROM for ADL's such as driving. Pt would continue to benefit from skilled PT interventions to address remaining cervico-thoracic ROM, strength and balance deficits.  OBJECTIVE IMPAIRMENTS: decreased mobility, decreased ROM, decreased strength, hypomobility, and impaired flexibility.   ACTIVITY LIMITATIONS: carrying and lifting  PARTICIPATION LIMITATIONS: community activity  PERSONAL FACTORS: Age, Past/current experiences, Time since onset of injury/illness/exacerbation, and 3+ comorbidities:  scleroderma, osteoporosis, hx of cancer  are also affecting patient's functional outcome.   REHAB POTENTIAL: Fair multi- comorbidities  CLINICAL DECISION MAKING: Evolving/moderate complexity  EVALUATION COMPLEXITY: Moderate   GOALS: Goals reviewed with patient? Yes  SHORT TERM GOALS: Target date: 12/01/23  Pt will be independent with HEP to improve cervical and shoulder strength with functional activities   Baseline: 06/24/23: HEP given to pt; 09/02/23: 25%  compliance; 11/03/23: 25% compliance Goal status: ON GOING   LONG TERM GOALS: Target date: 01/26/24  Pt will improve FOTO to target score to demonstrate clinically significant improvement in functional mobility.   Baseline: 06/24/23: 46/58; 09/02/23: 47/58; 11/03/23: 50/58 Goal status: ONGOING  2.  Pt will improve all R/L shoulder MMT 4+ to improve functional mobility of bilat UE's.  Baseline: 06/24/23; 09/02/23; 11/03/23; MMT Right eval Left eval Left  09/02/23 Right 09/02/23 Right  11/03/23 Left 11/03/23  Shoulder flexion 4 4- 4- 4- 4 4+  Shoulder extension        Shoulder abduction 4+ 4+ 5 4+ 4+ 5  Shoulder adduction        Shoulder extension        Shoulder internal rotation 4 4 4+ 4+ 4 5  Shoulder external rotation 4 4 4+ 4+ 4 5   Goal status: PARTIALLY MET  3.  Pt will improve all cervical AROM to [facility] to improve functional mobility.  Baseline: 06/24/23:  Active ROM A/PROM (deg) eval A/PROM (Deg)  09/02/23 A/PROM (Deg)  11/03/23  Flexion 45 deg 45 45  Extension 10 deg 10 10  Right lateral flexion 40 deg 20 40  Left lateral flexion 35 deg* 35 40  Right rotation 40 deg 35 40  Left rotation 25 deg* 25 30   Goal status: ONGOING   PLAN:  PT FREQUENCY: 1x/week  PT DURATION: 12 weeks  PLANNED INTERVENTIONS: Therapeutic exercises, Therapeutic activity, Neuromuscular re-education, Balance training, Gait training, Patient/Family education, Self Care, Joint mobilization, Spinal manipulation, Spinal mobilization, Cryotherapy, Moist heat, Manual therapy, and Re-evaluation  PLAN FOR NEXT SESSION: Cervical manual therapy/stretching. Progress cervico-thoracic strength/ shoulder strengthening. Compound movement patterns for cervicothoracic mobility and shoulder strength.  [name]. Fairly IV, PT, DPT Physical Therapist- [place]  [facility] 12/17/23, 8:39 AM

## 2023-12-23 ENCOUNTER — Ambulatory Visit: Payer: Medicare Other | Attending: Internal Medicine

## 2023-12-23 DIAGNOSIS — M6281 Muscle weakness (generalized): Secondary | ICD-10-CM | POA: Insufficient documentation

## 2023-12-23 DIAGNOSIS — M542 Cervicalgia: Secondary | ICD-10-CM | POA: Insufficient documentation

## 2023-12-23 DIAGNOSIS — M25611 Stiffness of right shoulder, not elsewhere classified: Secondary | ICD-10-CM | POA: Insufficient documentation

## 2023-12-23 NOTE — Therapy (Signed)
 OUTPATIENT PHYSICAL THERAPY CERVICAL TREATMENT/PROGRESS NOTE  Dates of Reporting Period: 09/23/23 - 12/16/23   Patient Name: Whitney Mclean MRN: [id number] DOB:[birth year], 58 y.o., female Today's Date: 12/23/2023  END OF SESSION:  PT End of Session - 12/23/23 1434     Visit Number 21    Number of Visits 30    Date for PT Re-Evaluation 01/26/24    Authorization - Number of Visits 30    PT Start Time 1433    PT Stop Time 1515    PT Time Calculation (min) 42 min    Activity Tolerance Patient tolerated treatment well    Behavior During Therapy WFL for tasks assessed/performed             Past Medical History:  Diagnosis Date   Oral cancer (HCC)    Personal history of chemotherapy    Personal history of radiation therapy    Scleroderma (HCC)    Past Surgical History:  Procedure Laterality Date   ABDOMINAL HYSTERECTOMY     Patient Active Problem List   Diagnosis Date Noted   Other secondary kyphosis, cervicothoracic region 05/19/2023   Compression fracture of T3 vertebra (HCC) 05/19/2023   Compression fracture of C7 vertebra (HCC) 05/19/2023   Osteoporosis 05/19/2023    PCP: Danella Penton, MD  REFERRING PROVIDER: Danella Penton, MD   REFERRING DIAG: S12.600A (ICD-10-CM) - Unspecified displaced fracture of seventh cervical vertebra, initial encounter for closed fracture  THERAPY DIAG:  Cervicalgia  Muscle weakness (generalized)  Rationale for Evaluation and Treatment: Rehabilitation  ONSET DATE: July 2024  SUBJECTIVE:                                                                                                                                                                                                         SUBJECTIVE STATEMENT: Pt reports she is doing well. No pain but reported consistent stiffness. No HEP.   PERTINENT HISTORY:  Pt presents to PT w/ chronic limited cervical mobility 2/2 severe osteoporosis, scleroderma, and hx of cervical fibrosis  of the neck. Since previous bout of PT patient had a fall in the restroom resulting in three fx vertebrae, fx of six ribs, and a fx of her R shoulder blade. Pt's current fx are being managed non-surgically. Pt reports no pain or sx of N/T. She states she has the most difficulty with maintaining a seated position with no back support and is only able to maintain this position for 2-3 minutes before requiring back support. Pt also reports an increased fear of falling.  PAIN:  Are you having pain? No Main aggravation cannot sit up without back support, increased fear of falling.  Easing factors: having a supported back 2-3 minutes of sitting unsupported before needing support  PRECAUTIONS: Fall  RED FLAGS: None     WEIGHT BEARING RESTRICTIONS: No  FALLS:  Has patient fallen in last 6 months? Yes. Number of falls 1 and near falls  LIVING ENVIRONMENT: Has following equipment at home: Walker - 2 wheeled *Pt does not use RW often  OCCUPATION: Retired, after scleroderma dx  PLOF: Independent  PATIENT GOALS: To get moving again   NEXT MD VISIT: N/A  OBJECTIVE:  Note: Objective measures were completed at Evaluation unless otherwise noted.  DIAGNOSTIC FINDINGS:  CLINICAL DATA:  The patient suffered cervical and thoracic spine compression fractures in a fall 03/25/2023. Subsequent encounter.   EXAM: MRI CERVICAL SPINE WITHOUT CONTRAST   TECHNIQUE: Multiplanar, multisequence MR imaging of the cervical spine was performed. No intravenous contrast was administered.   COMPARISON:  CT chest 04/16/2023.   FINDINGS: Alignment: No listhesis. Mild kyphosis is centered about the T1 level.   Vertebrae: The patient has a superior endplate compression fracture of C7 with vertebral body height loss of approximately 30%, a biconcave compression fracture of T1 vertebral body height loss of approximately 50% and a biconcave compression fracture T3 with vertebral body height loss centrally  of approximately 80%. There is marrow edema within each of the vertebral bodies consistent with acute or early subacute injuries. Marrow edema is also seen in the right fifth and sixth ribs due to the fractures seen on the prior CT.   Cord: Normal signal throughout the visualized cord.   Posterior Fossa, vertebral arteries, paraspinal tissues: Negative.   IMPRESSION: 1. Acute or early subacute compression fractures of C7, T1 and T3 as described above. No retropulsion off the superior endplates of C7 or T1. The central spinal canal and neural foramina are open at all levels. 2. Marrow edema in the right fifth and sixth ribs due to the fractures seen on the prior CT.  PATIENT SURVEYS:  FOTO 48/57  COGNITION: Overall cognitive status: Within functional limits for tasks assessed  SENSATION: WFL  POSTURE: rounded shoulders and forward head  PALPATION: No TTP noted    CERVICAL ROM:   Active ROM A/PROM (deg) eval  Flexion 45 deg  Extension 10 deg  Right lateral flexion 40 deg  Left lateral flexion 35 deg*  Right rotation 40 deg  Left rotation 25 deg*   (Blank rows = not tested)  All cervical PROM= AROM   UPPER EXTREMITY ROM:  Active ROM Right eval Left eval  Shoulder flexion    Shoulder extension    Shoulder abduction 100 deg   Shoulder adduction    Shoulder extension    Shoulder internal rotation C7 C7  Shoulder external rotation Mid- thoracic Mid- thoracic  Elbow flexion    Elbow extension    Wrist flexion    Wrist extension    Wrist ulnar deviation    Wrist radial deviation    Wrist pronation    Wrist supination     (Blank rows = not tested)  UPPER EXTREMITY MMT:  MMT Right eval Left eval  Shoulder flexion 4 4-  Shoulder extension    Shoulder abduction 4+ 4+  Shoulder adduction    Shoulder extension    Shoulder internal rotation 4 4  Shoulder external rotation 4 4  Middle trapezius    Lower trapezius    Elbow  flexion 4+ 4+  Elbow  extension 4+ 4+  Wrist flexion    Wrist extension    Wrist ulnar deviation    Wrist radial deviation    Wrist pronation    Wrist supination    Grip strength 4 4   (Blank rows = not tested)  CERVICAL SPECIAL TESTS:  Deferred 2/2 to limited cervical mobility  FUNCTIONAL TESTS:  DNF Endurance Test: Deferred to next session   SLS assessment RLE- 16 seconds LLE- 27 seconds w/ bilat UE support 07/08/23  TODAY'S TREATMENT: DATE: 12/23/23  There.Ex:  UBE L5 2 min forward, 2 min backward for UE and thoracic warm up. Not billed.   10 minutes total in supine for L cervical rotation, L lateral flexion, cervical extension. 5x30 sec/direction with PT OP.   There.Act:  [employer] with PVC against wall and 5# AW at the top 2 x 5 BUE; seated rest break b/t sets  Standing B shoulder horizontal abduction: YTB 2x10, multi modal cue for technique and form.   2x10 second push plank for core, cervical, and shoulder stability; multi modal cues for neutral spine line.   STS with overhead press with 3# DB: 3 x 5; in order improve functional mobility and overhead tasks. Seated Rest breaks b/t sets.   PATIENT EDUCATION:  Education details: Education on Technique with exercises  Person educated: Patient Education method: Medical illustrator Education comprehension: verbalized understanding and returned demonstration  HOME EXERCISE PROGRAM: Access Code: [email or web address] URL: [email or web address] Date: 08/12/2023 Prepared by: Ronnie Derby  Exercises - Supine Chin Tuck  - 1 x daily - 4-5 x weekly - 2 sets - 10 reps - Cervical Extension AROM with Strap  - 1 x daily - 4-5 x weekly - 2 sets - 10 reps - Seated Levator Scapulae Stretch  - 1 x daily - 4-5 x weekly - 1 sets - 2 reps - 30 hold - Seated scapular retraction   - 1 x daily - 4-5 x weekly - 2 sets - 10 reps - Standing Tandem Balance with Counter Support  - 1 x daily - 3-4 x weekly - 1 sets - 3 reps - 30 hold - Standing  Single Leg Stance with Counter Support  - 1 x daily - 3-4 x weekly - 1 sets - 3 reps - 30 hold   Access Code: [email or web address] URL: [email or web address] Date: 06/24/2023 Prepared by: Tomasa Hose  Exercises - Supine Chin Tuck  - 1 x daily - 4-5 x weekly - 2 sets - 10 reps - Cervical Extension AROM with Strap  - 1 x daily - 4-5 x weekly - 2 sets - 10 reps - Seated Levator Scapulae Stretch  - 1 x daily - 4-5 x weekly - 1 sets - 2 reps - 30 hold - Seated scapular retraction   - 1 x daily - 4-5 x weekly - 2 sets - 10 reps  ASSESSMENT:  CLINICAL IMPRESSION: Pt arrived to PT with a main focus addressing cervical, thoracic and R shoulder mobility deficits. Continued to address limited cervical AROM with manual interventions. Exercises with focus on compound patterns in standing to improve postural stability. Patient tolerated increased intensity and volume with resistive exercises. Patient still presents with limited cervical ROM secondary to chronic conditions limiting full participation in recreational activities. Will continue to progress exercises as tolerated and maintain cervical ROM adequate for activities such as driving. Based on today's performance, patient will continue to benefit from skilled PT in order to address current deficits  and maximize safety.   OBJECTIVE IMPAIRMENTS: decreased mobility, decreased ROM, decreased strength, hypomobility, and impaired flexibility.   ACTIVITY LIMITATIONS: carrying and lifting  PARTICIPATION LIMITATIONS: community activity  PERSONAL FACTORS: Age, Past/current experiences, Time since onset of injury/illness/exacerbation, and 3+ comorbidities: scleroderma, osteoporosis, hx of cancer  are also affecting patient's functional outcome.   REHAB POTENTIAL: Fair multi- comorbidities  CLINICAL DECISION MAKING: Evolving/moderate complexity  EVALUATION COMPLEXITY: Moderate   GOALS: Goals reviewed with patient? Yes  SHORT TERM GOALS: Target  date: 12/01/23  Pt will be independent with HEP to improve cervical and shoulder strength with functional activities   Baseline: 06/24/23: HEP given to pt; 09/02/23: 25% compliance; 11/03/23: 25% compliance Goal status: ON GOING   LONG TERM GOALS: Target date: 01/26/24  Pt will improve FOTO to target score to demonstrate clinically significant improvement in functional mobility.   Baseline: 06/24/23: 46/58; 09/02/23: 47/58; 11/03/23: 50/58 Goal status: ONGOING  2.  Pt will improve all R/L shoulder MMT 4+ to improve functional mobility of bilat UE's.  Baseline: 06/24/23; 09/02/23; 11/03/23; MMT Right eval Left eval Left  09/02/23 Right 09/02/23 Right  11/03/23 Left 11/03/23  Shoulder flexion 4 4- 4- 4- 4 4+  Shoulder extension        Shoulder abduction 4+ 4+ 5 4+ 4+ 5  Shoulder adduction        Shoulder extension        Shoulder internal rotation 4 4 4+ 4+ 4 5  Shoulder external rotation 4 4 4+ 4+ 4 5   Goal status: PARTIALLY MET  3.  Pt will improve all cervical AROM to [facility] to improve functional mobility.  Baseline: 06/24/23:  Active ROM A/PROM (deg) eval A/PROM (Deg)  09/02/23 A/PROM (Deg)  11/03/23  Flexion 45 deg 45 45  Extension 10 deg 10 10  Right lateral flexion 40 deg 20 40  Left lateral flexion 35 deg* 35 40  Right rotation 40 deg 35 40  Left rotation 25 deg* 25 30   Goal status: ONGOING   PLAN:  PT FREQUENCY: 1x/week  PT DURATION: 12 weeks  PLANNED INTERVENTIONS: Therapeutic exercises, Therapeutic activity, Neuromuscular re-education, Balance training, Gait training, Patient/Family education, Self Care, Joint mobilization, Spinal manipulation, Spinal mobilization, Cryotherapy, Moist heat, Manual therapy, and Re-evaluation  PLAN FOR NEXT SESSION: Cervical manual therapy/stretching. Progress postural strengthening exercises. Compound movement patterns for cervicothoracic mobility and shoulder strength.  Christene Lye PT, DPT Physical Therapist- [facility] 12/23/23, 5:23 PM

## 2023-12-29 ENCOUNTER — Other Ambulatory Visit: Payer: Self-pay | Admitting: Internal Medicine

## 2023-12-29 DIAGNOSIS — M349 Systemic sclerosis, unspecified: Secondary | ICD-10-CM

## 2023-12-30 ENCOUNTER — Ambulatory Visit: Payer: Medicare Other

## 2023-12-30 DIAGNOSIS — M542 Cervicalgia: Secondary | ICD-10-CM

## 2023-12-30 DIAGNOSIS — M6281 Muscle weakness (generalized): Secondary | ICD-10-CM

## 2023-12-30 NOTE — Therapy (Signed)
 OUTPATIENT PHYSICAL THERAPY CERVICAL TREATMENT   Patient Name: Whitney Mclean MRN: [id number] DOB:06-Jan-1966, 58 y.o., female Today's Date: 12/30/2023  END OF SESSION:  PT End of Session - 12/30/23 1434     Visit Number 22    Number of Visits 30    Date for PT Re-Evaluation 01/26/24    Authorization - Number of Visits 30    PT Start Time 1432    PT Stop Time 1510    PT Time Calculation (min) 38 min    Activity Tolerance Patient tolerated treatment well    Behavior During Therapy WFL for tasks assessed/performed             Past Medical History:  Diagnosis Date   Oral cancer (HCC)    Personal history of chemotherapy    Personal history of radiation therapy    Scleroderma (HCC)    Past Surgical History:  Procedure Laterality Date   ABDOMINAL HYSTERECTOMY     Patient Active Problem List   Diagnosis Date Noted   Other secondary kyphosis, cervicothoracic region 05/19/2023   Compression fracture of T3 vertebra (HCC) 05/19/2023   Compression fracture of C7 vertebra (HCC) 05/19/2023   Osteoporosis 05/19/2023    PCP: Danella Penton, MD  REFERRING PROVIDER: Danella Penton, MD   REFERRING DIAG: S12.600A (ICD-10-CM) - Unspecified displaced fracture of seventh cervical vertebra, initial encounter for closed fracture  THERAPY DIAG:  Cervicalgia  Muscle weakness (generalized)  Rationale for Evaluation and Treatment: Rehabilitation  ONSET DATE: July 2024  SUBJECTIVE:                                                                                                                                                                                                         SUBJECTIVE STATEMENT: Pt reports she continues to do well. No pain reported but still continues to have stiffness limiting her ROM. No questions at start of session.   PERTINENT HISTORY:  Pt presents to PT w/ chronic limited cervical mobility 2/2 severe osteoporosis, scleroderma, and hx of cervical  fibrosis of the neck. Since previous bout of PT patient had a fall in the restroom resulting in three fx vertebrae, fx of six ribs, and a fx of her R shoulder blade. Pt's current fx are being managed non-surgically. Pt reports no pain or sx of N/T. She states she has the most difficulty with maintaining a seated position with no back support and is only able to maintain this position for 2-3 minutes before requiring back support. Pt also reports an increased fear of falling.  PAIN:  Are you having pain? No Main aggravation cannot sit up without back support, increased fear of falling.  Easing factors: having a supported back 2-3 minutes of sitting unsupported before needing support  PRECAUTIONS: Fall  RED FLAGS: None     WEIGHT BEARING RESTRICTIONS: No  FALLS:  Has patient fallen in last 6 months? Yes. Number of falls 1 and near falls  LIVING ENVIRONMENT: Has following equipment at home: Walker - 2 wheeled *Pt does not use RW often  OCCUPATION: Retired, after scleroderma dx  PLOF: Independent  PATIENT GOALS: To get moving again   NEXT MD VISIT: N/A  OBJECTIVE:  Note: Objective measures were completed at Evaluation unless otherwise noted.  DIAGNOSTIC FINDINGS:  CLINICAL DATA:  The patient suffered cervical and thoracic spine compression fractures in a fall 03/25/2023. Subsequent encounter.   EXAM: MRI CERVICAL SPINE WITHOUT CONTRAST   TECHNIQUE: Multiplanar, multisequence MR imaging of the cervical spine was performed. No intravenous contrast was administered.   COMPARISON:  CT chest 04/16/2023.   FINDINGS: Alignment: No listhesis. Mild kyphosis is centered about the T1 level.   Vertebrae: The patient has a superior endplate compression fracture of C7 with vertebral body height loss of approximately 30%, a biconcave compression fracture of T1 vertebral body height loss of approximately 50% and a biconcave compression fracture T3 with vertebral body height loss  centrally of approximately 80%. There is marrow edema within each of the vertebral bodies consistent with acute or early subacute injuries. Marrow edema is also seen in the right fifth and sixth ribs due to the fractures seen on the prior CT.   Cord: Normal signal throughout the visualized cord.   Posterior Fossa, vertebral arteries, paraspinal tissues: Negative.   IMPRESSION: 1. Acute or early subacute compression fractures of C7, T1 and T3 as described above. No retropulsion off the superior endplates of C7 or T1. The central spinal canal and neural foramina are open at all levels. 2. Marrow edema in the right fifth and sixth ribs due to the fractures seen on the prior CT.  PATIENT SURVEYS:  FOTO 48/57  COGNITION: Overall cognitive status: Within functional limits for tasks assessed  SENSATION: WFL  POSTURE: rounded shoulders and forward head  PALPATION: No TTP noted    CERVICAL ROM:   Active ROM A/PROM (deg) eval  Flexion 45 deg  Extension 10 deg  Right lateral flexion 40 deg  Left lateral flexion 35 deg*  Right rotation 40 deg  Left rotation 25 deg*   (Blank rows = not tested)  All cervical PROM= AROM   UPPER EXTREMITY ROM:  Active ROM Right eval Left eval  Shoulder flexion    Shoulder extension    Shoulder abduction 100 deg   Shoulder adduction    Shoulder extension    Shoulder internal rotation C7 C7  Shoulder external rotation Mid- thoracic Mid- thoracic  Elbow flexion    Elbow extension    Wrist flexion    Wrist extension    Wrist ulnar deviation    Wrist radial deviation    Wrist pronation    Wrist supination     (Blank rows = not tested)  UPPER EXTREMITY MMT:  MMT Right eval Left eval  Shoulder flexion 4 4-  Shoulder extension    Shoulder abduction 4+ 4+  Shoulder adduction    Shoulder extension    Shoulder internal rotation 4 4  Shoulder external rotation 4 4  Middle trapezius    Lower trapezius    Elbow  flexion 4+ 4+   Elbow extension 4+ 4+  Wrist flexion    Wrist extension    Wrist ulnar deviation    Wrist radial deviation    Wrist pronation    Wrist supination    Grip strength 4 4   (Blank rows = not tested)  CERVICAL SPECIAL TESTS:  Deferred 2/2 to limited cervical mobility  FUNCTIONAL TESTS:  DNF Endurance Test: Deferred to next session   SLS assessment RLE- 16 seconds LLE- 27 seconds w/ bilat UE support 07/08/23  TODAY'S TREATMENT: DATE: 12/30/23  There.Ex:  UBE, Seat 6 L5 2 min forward, 2 min backward for UE and thoracic warm up. Not billed.   10 minutes total in supine for L cervical rotation, L lateral flexion, cervical extension. 5x30 sec/direction with PT OP.   There.Act:  Red physioball overhead raises shoulder AAROM with 5# AW's at LUE, 3#AW at RUE 2x10.   Standing B shoulder horizontal abduction: YTB 2x10, multi modal cue for technique and form.   Standing B Scapular Row: RTB 2x10, multi modal cue for technique and form for scapular retraction and shoulders neutral   STS with Overhead Press 3# DB 2 x 8;  PT assist RUE with final 3 repetitions in order to improve LE strength and performing overhead movements    PATIENT EDUCATION:  Education details: Education on Technique with exercises  Person educated: Patient Education method: Medical illustrator Education comprehension: verbalized understanding and returned demonstration  HOME EXERCISE PROGRAM: Access Code: [email or web address] URL: [email or web address] Date: 08/12/2023 Prepared by: Ronnie Derby  Exercises - Supine Chin Tuck  - 1 x daily - 4-5 x weekly - 2 sets - 10 reps - Cervical Extension AROM with Strap  - 1 x daily - 4-5 x weekly - 2 sets - 10 reps - Seated Levator Scapulae Stretch  - 1 x daily - 4-5 x weekly - 1 sets - 2 reps - 30 hold - Seated scapular retraction   - 1 x daily - 4-5 x weekly - 2 sets - 10 reps - Standing Tandem Balance with Counter Support  - 1 x daily - 3-4 x weekly - 1  sets - 3 reps - 30 hold - Standing Single Leg Stance with Counter Support  - 1 x daily - 3-4 x weekly - 1 sets - 3 reps - 30 hold   Access Code: [email or web address] URL: [email or web address] Date: 06/24/2023 Prepared by: Tomasa Hose  Exercises - Supine Chin Tuck  - 1 x daily - 4-5 x weekly - 2 sets - 10 reps - Cervical Extension AROM with Strap  - 1 x daily - 4-5 x weekly - 2 sets - 10 reps - Seated Levator Scapulae Stretch  - 1 x daily - 4-5 x weekly - 1 sets - 2 reps - 30 hold - Seated scapular retraction   - 1 x daily - 4-5 x weekly - 2 sets - 10 reps  ASSESSMENT:  CLINICAL IMPRESSION:  Pt arrived to OPPT with a main focus on addressing cervical, thoracic and scapular mobility deficits. Pt tolerated all exercises in session with intermittent seated rest breaks in between sets to mitigate fatigue. She still continues to present with severe cervical ROM deficits secondary to chronic conditions limiting her full participation with recreational activities. Will continue to progress cervical and scapular strengthening as tolerated. Plan for next session to include additional compound exercises including over head based movements. Based on her current impairments the patient will still continue to benefit from skilled PT  in order to maximize safety and facilitate return to PLOF.   OBJECTIVE IMPAIRMENTS: decreased mobility, decreased ROM, decreased strength, hypomobility, and impaired flexibility.   ACTIVITY LIMITATIONS: carrying and lifting  PARTICIPATION LIMITATIONS: community activity  PERSONAL FACTORS: Age, Past/current experiences, Time since onset of injury/illness/exacerbation, and 3+ comorbidities: scleroderma, osteoporosis, hx of cancer  are also affecting patient's functional outcome.   REHAB POTENTIAL: Fair multi- comorbidities  CLINICAL DECISION MAKING: Evolving/moderate complexity  EVALUATION COMPLEXITY: Moderate   GOALS: Goals reviewed with patient? Yes  SHORT  TERM GOALS: Target date: 12/01/23  Pt will be independent with HEP to improve cervical and shoulder strength with functional activities   Baseline: 06/24/23: HEP given to pt; 09/02/23: 25% compliance; 11/03/23: 25% compliance Goal status: ON GOING   LONG TERM GOALS: Target date: 01/26/24  Pt will improve FOTO to target score to demonstrate clinically significant improvement in functional mobility.   Baseline: 06/24/23: 46/58; 09/02/23: 47/58; 11/03/23: 50/58 Goal status: ONGOING  2.  Pt will improve all R/L shoulder MMT 4+ to improve functional mobility of bilat UE's.  Baseline: 06/24/23; 09/02/23; 11/03/23; MMT Right eval Left eval Left  09/02/23 Right 09/02/23 Right  11/03/23 Left 11/03/23  Shoulder flexion 4 4- 4- 4- 4 4+  Shoulder extension        Shoulder abduction 4+ 4+ 5 4+ 4+ 5  Shoulder adduction        Shoulder extension        Shoulder internal rotation 4 4 4+ 4+ 4 5  Shoulder external rotation 4 4 4+ 4+ 4 5   Goal status: PARTIALLY MET  3.  Pt will improve all cervical AROM to [facility] to improve functional mobility.  Baseline: 06/24/23:  Active ROM A/PROM (deg) eval A/PROM (Deg)  09/02/23 A/PROM (Deg)  11/03/23  Flexion 45 deg 45 45  Extension 10 deg 10 10  Right lateral flexion 40 deg 20 40  Left lateral flexion 35 deg* 35 40  Right rotation 40 deg 35 40  Left rotation 25 deg* 25 30   Goal status: ONGOING   PLAN:  PT FREQUENCY: 1x/week  PT DURATION: 12 weeks  PLANNED INTERVENTIONS: Therapeutic exercises, Therapeutic activity, Neuromuscular re-education, Balance training, Gait training, Patient/Family education, Self Care, Joint mobilization, Spinal manipulation, Spinal mobilization, Cryotherapy, Moist heat, Manual therapy, and Re-evaluation  PLAN FOR NEXT SESSION: Cervical manual therapy/stretching. Progress postural strengthening exercises. Overhead head motions/strengthening. Compound movement patterns for cervicothoracic mobility and shoulder  strength.  Christene Lye PT, DPT Physical Therapist- [facility] 12/30/23, 9:09 PM

## 2024-01-06 ENCOUNTER — Ambulatory Visit: Payer: Medicare Other

## 2024-01-06 DIAGNOSIS — M542 Cervicalgia: Secondary | ICD-10-CM

## 2024-01-06 DIAGNOSIS — M25611 Stiffness of right shoulder, not elsewhere classified: Secondary | ICD-10-CM

## 2024-01-06 DIAGNOSIS — M6281 Muscle weakness (generalized): Secondary | ICD-10-CM

## 2024-01-06 NOTE — Therapy (Signed)
 OUTPATIENT PHYSICAL THERAPY CERVICAL TREATMENT   Patient Name: Whitney Mclean MRN: [id number] DOB:[birth year], 58 y.o., female Today's Date: 01/06/2024  END OF SESSION:  PT End of Session - 01/06/24 1432     Visit Number 23    Number of Visits 30    Date for PT Re-Evaluation 01/26/24    Authorization - Number of Visits 30    PT Start Time 1430    PT Stop Time 1510    PT Time Calculation (min) 40 min    Activity Tolerance Patient tolerated treatment well    Behavior During Therapy [facility] for tasks assessed/performed             Past Medical History:  Diagnosis Date   Oral cancer (HCC)    Personal history of chemotherapy    Personal history of radiation therapy    Scleroderma (HCC)    Past Surgical History:  Procedure Laterality Date   ABDOMINAL HYSTERECTOMY     Patient Active Problem List   Diagnosis Date Noted   Other secondary kyphosis, cervicothoracic region 05/19/2023   Compression fracture of T3 vertebra (HCC) 05/19/2023   Compression fracture of C7 vertebra (HCC) 05/19/2023   Osteoporosis 05/19/2023    PCP: Sari Cunning, MD  REFERRING PROVIDER: Sari Cunning, MD   REFERRING DIAG: S12.600A (ICD-10-CM) - Unspecified displaced fracture of seventh cervical vertebra, initial encounter for closed fracture  THERAPY DIAG:  Cervicalgia  Muscle weakness (generalized)  Stiffness of right shoulder, not elsewhere classified  Rationale for Evaluation and Treatment: Rehabilitation  ONSET DATE: July 2024  SUBJECTIVE:                                                                                                                                                                                                         SUBJECTIVE STATEMENT: Pt reports she continues to do well. Last session patient reported moderate soreness in lower back due to excessive overhead reaching activities. No pain reported but still continues to have stiffness limiting her ROM. No  questions at start of session.   PERTINENT HISTORY:  Pt presents to PT w/ chronic limited cervical mobility 2/2 severe osteoporosis, scleroderma, and hx of cervical fibrosis of the neck. Since previous bout of PT patient had a fall in the restroom resulting in three fx vertebrae, fx of six ribs, and a fx of her R shoulder blade. Pt's current fx are being managed non-surgically. Pt reports no pain or sx of N/T. She states she has the most difficulty with maintaining a seated position with no back support  and is only able to maintain this position for 2-3 minutes before requiring back support. Pt also reports an increased fear of falling.   PAIN:  Are you having pain? No Main aggravation cannot sit up without back support, increased fear of falling.  Easing factors: having a supported back 2-3 minutes of sitting unsupported before needing support  PRECAUTIONS: Fall  RED FLAGS: None     WEIGHT BEARING RESTRICTIONS: No  FALLS:  Has patient fallen in last 6 months? Yes. Number of falls 1 and near falls  LIVING ENVIRONMENT: Has following equipment at home: Walker - 2 wheeled *Pt does not use RW often  OCCUPATION: Retired, after scleroderma dx  PLOF: Independent  PATIENT GOALS: To get moving again   NEXT MD VISIT: N/A  OBJECTIVE:  Note: Objective measures were completed at Evaluation unless otherwise noted.  DIAGNOSTIC FINDINGS:  CLINICAL DATA:  The patient suffered cervical and thoracic spine compression fractures in a fall 03/25/2023. Subsequent encounter.   EXAM: MRI CERVICAL SPINE WITHOUT CONTRAST   TECHNIQUE: Multiplanar, multisequence MR imaging of the cervical spine was performed. No intravenous contrast was administered.   COMPARISON:  CT chest 04/16/2023.   FINDINGS: Alignment: No listhesis. Mild kyphosis is centered about the T1 level.   Vertebrae: The patient has a superior endplate compression fracture of C7 with vertebral body height loss of approximately  30%, a biconcave compression fracture of T1 vertebral body height loss of approximately 50% and a biconcave compression fracture T3 with vertebral body height loss centrally of approximately 80%. There is marrow edema within each of the vertebral bodies consistent with acute or early subacute injuries. Marrow edema is also seen in the right fifth and sixth ribs due to the fractures seen on the prior CT.   Cord: Normal signal throughout the visualized cord.   Posterior Fossa, vertebral arteries, paraspinal tissues: Negative.   IMPRESSION: 1. Acute or early subacute compression fractures of C7, T1 and T3 as described above. No retropulsion off the superior endplates of C7 or T1. The central spinal canal and neural foramina are open at all levels. 2. Marrow edema in the right fifth and sixth ribs due to the fractures seen on the prior CT.  PATIENT SURVEYS:  FOTO 48/57  COGNITION: Overall cognitive status: Within functional limits for tasks assessed  SENSATION: WFL  POSTURE: rounded shoulders and forward head  PALPATION: No TTP noted    CERVICAL ROM:   Active ROM A/PROM (deg) eval  Flexion 45 deg  Extension 10 deg  Right lateral flexion 40 deg  Left lateral flexion 35 deg*  Right rotation 40 deg  Left rotation 25 deg*   (Blank rows = not tested)  All cervical PROM= AROM   UPPER EXTREMITY ROM:  Active ROM Right eval Left eval  Shoulder flexion    Shoulder extension    Shoulder abduction 100 deg   Shoulder adduction    Shoulder extension    Shoulder internal rotation C7 C7  Shoulder external rotation Mid- thoracic Mid- thoracic  Elbow flexion    Elbow extension    Wrist flexion    Wrist extension    Wrist ulnar deviation    Wrist radial deviation    Wrist pronation    Wrist supination     (Blank rows = not tested)  UPPER EXTREMITY MMT:  MMT Right eval Left eval  Shoulder flexion 4 4-  Shoulder extension    Shoulder abduction 4+ 4+  Shoulder  adduction    Shoulder extension  Shoulder internal rotation 4 4  Shoulder external rotation 4 4  Middle trapezius    Lower trapezius    Elbow flexion 4+ 4+  Elbow extension 4+ 4+  Wrist flexion    Wrist extension    Wrist ulnar deviation    Wrist radial deviation    Wrist pronation    Wrist supination    Grip strength 4 4   (Blank rows = not tested)  CERVICAL SPECIAL TESTS:  Deferred 2/2 to limited cervical mobility  FUNCTIONAL TESTS:  DNF Endurance Test: Deferred to next session   SLS assessment RLE- 16 seconds LLE- 27 seconds w/ bilat UE support 07/08/23  TODAY'S TREATMENT: DATE: 01/06/24  There.Ex:  UBE, Seat 6 L5 2 min forward, 2 min backward for UE and thoracic warm up.  10 minutes total in supine for L cervical rotation, L lateral flexion, cervical extension. 5x30 sec/direction with PT OP.   Standing B Scapular Row:  2x10 Blue TB  - Seated Rest break in between sets due to fatigue   There.Act:    Standard Plank for core and cervical stability   3 x 10s; VC for neutral spine   Wall Slides with Foam Roller in order to improve scapular strength/mobility and overhead reaching:   2 x 10 with YTB around wrists   Standing B shoulder horizontal abduction in order to improve parascapular strength with household tasks:  1 x 10 RTB, regressed due to difficulty 1 x 10 YTB    PATIENT EDUCATION:  Education details: Education on Technique with exercises  Person educated: Patient Education method: Medical illustrator Education comprehension: verbalized understanding and returned demonstration  HOME EXERCISE PROGRAM: Access Code: [email or web address] URL: [email or web address] Date: 08/12/2023 Prepared by: Lyda Samples  Exercises - Supine Chin Tuck  - 1 x daily - 4-5 x weekly - 2 sets - 10 reps - Cervical Extension AROM with Strap  - 1 x daily - 4-5 x weekly - 2 sets - 10 reps - Seated Levator Scapulae Stretch  - 1 x daily - 4-5 x weekly - 1 sets -  2 reps - 30 hold - Seated scapular retraction   - 1 x daily - 4-5 x weekly - 2 sets - 10 reps - Standing Tandem Balance with Counter Support  - 1 x daily - 3-4 x weekly - 1 sets - 3 reps - 30 hold - Standing Single Leg Stance with Counter Support  - 1 x daily - 3-4 x weekly - 1 sets - 3 reps - 30 hold   Access Code: [email or web address] URL: [email or web address] Date: 06/24/2023 Prepared by: Dawson Europe  Exercises - Supine Chin Tuck  - 1 x daily - 4-5 x weekly - 2 sets - 10 reps - Cervical Extension AROM with Strap  - 1 x daily - 4-5 x weekly - 2 sets - 10 reps - Seated Levator Scapulae Stretch  - 1 x daily - 4-5 x weekly - 1 sets - 2 reps - 30 hold - Seated scapular retraction   - 1 x daily - 4-5 x weekly - 2 sets - 10 reps  ASSESSMENT:  CLINICAL IMPRESSION: Pt arrived to OPPT with continued focus on addressing cervical, thoracic, and scapular mobility deficits. PT regressed exercises from overhead reaching due to concern of lumbar pain. Pt tolerated all interventions in today's session without report of additional pain in neck or lumbar spine. She still continues to present with severe cervical ROM deficits secondary to chronic conditions limiting her full participation with  recreational activities and household tasks. Will continue to progress cervical and scapular strengthening as tolerated. Plan for next session to include return of compound exercises with over head movements as tolerated. Based on her current impairments the patient will still continue to benefit from skilled PT in order to maximize safety and facilitate return to PLOF.  Pt arrived to OPPT with a main focus on addressing cervical, thoracic and scapular mobility deficits. Pt tolerated all exercises in session with intermittent seated rest breaks in between sets to mitigate fatigue. She still continues to present with severe cervical ROM deficits secondary to chronic conditions limiting her full participation with  recreational activities. Will continue to progress cervical and scapular strengthening as tolerated. Plan for next session to include additional compound exercises including over head based movements. Based on her current impairments the patient will still continue to benefit from skilled PT in order to maximize safety and facilitate return to PLOF.   OBJECTIVE IMPAIRMENTS: decreased mobility, decreased ROM, decreased strength, hypomobility, and impaired flexibility.   ACTIVITY LIMITATIONS: carrying and lifting  PARTICIPATION LIMITATIONS: community activity  PERSONAL FACTORS: Age, Past/current experiences, Time since onset of injury/illness/exacerbation, and 3+ comorbidities: scleroderma, osteoporosis, hx of cancer  are also affecting patient's functional outcome.   REHAB POTENTIAL: Fair multi- comorbidities  CLINICAL DECISION MAKING: Evolving/moderate complexity  EVALUATION COMPLEXITY: Moderate   GOALS: Goals reviewed with patient? Yes  SHORT TERM GOALS: Target date: 12/01/23  Pt will be independent with HEP to improve cervical and shoulder strength with functional activities   Baseline: 06/24/23: HEP given to pt; 09/02/23: 25% compliance; 11/03/23: 25% compliance Goal status: ON GOING   LONG TERM GOALS: Target date: 01/26/24  Pt will improve FOTO to target score to demonstrate clinically significant improvement in functional mobility.   Baseline: 06/24/23: 46/58; 09/02/23: 47/58; 11/03/23: 50/58 Goal status: ONGOING  2.  Pt will improve all R/L shoulder MMT 4+ to improve functional mobility of bilat UE's.  Baseline: 06/24/23; 09/02/23; 11/03/23; MMT Right eval Left eval Left  09/02/23 Right 09/02/23 Right  11/03/23 Left 11/03/23  Shoulder flexion 4 4- 4- 4- 4 4+  Shoulder extension        Shoulder abduction 4+ 4+ 5 4+ 4+ 5  Shoulder adduction        Shoulder extension        Shoulder internal rotation 4 4 4+ 4+ 4 5  Shoulder external rotation 4 4 4+ 4+ 4 5   Goal status:  PARTIALLY MET  3.  Pt will improve all cervical AROM to [facility] to improve functional mobility.  Baseline: 06/24/23:  Active ROM A/PROM (deg) eval A/PROM (Deg)  09/02/23 A/PROM (Deg)  11/03/23  Flexion 45 deg 45 45  Extension 10 deg 10 10  Right lateral flexion 40 deg 20 40  Left lateral flexion 35 deg* 35 40  Right rotation 40 deg 35 40  Left rotation 25 deg* 25 30   Goal status: ONGOING   PLAN:  PT FREQUENCY: 1x/week  PT DURATION: 12 weeks  PLANNED INTERVENTIONS: Therapeutic exercises, Therapeutic activity, Neuromuscular re-education, Balance training, Gait training, Patient/Family education, Self Care, Joint mobilization, Spinal manipulation, Spinal mobilization, Cryotherapy, Moist heat, Manual therapy, and Re-evaluation  PLAN FOR NEXT SESSION: Cervical manual therapy/stretching. Progress postural strengthening exercises. Overhead head motions/strengthening. Compound movement patterns for cervicothoracic mobility and shoulder strength.  Christene Lye PT, DPT Physical Therapist- [facility] 01/06/24, 2:52 PM

## 2024-01-12 ENCOUNTER — Ambulatory Visit: Payer: Medicare Other

## 2024-01-12 DIAGNOSIS — M542 Cervicalgia: Secondary | ICD-10-CM | POA: Diagnosis not present

## 2024-01-12 DIAGNOSIS — M6281 Muscle weakness (generalized): Secondary | ICD-10-CM

## 2024-01-12 DIAGNOSIS — M25611 Stiffness of right shoulder, not elsewhere classified: Secondary | ICD-10-CM

## 2024-01-12 NOTE — Therapy (Signed)
 OUTPATIENT PHYSICAL THERAPY CERVICAL TREATMENT   Patient Name: Whitney Mclean MRN: [id number] DOB:[birth year], 58 y.o., female Today's Date: 01/12/2024  END OF SESSION:  PT End of Session - 01/12/24 1430     Visit Number 24    Number of Visits 30    Date for PT Re-Evaluation 01/26/24    Authorization Type 2025 - Calendar VL 30    Authorization Time Period 09/24/23-09/22/24    Authorization - Visit Number 14    Authorization - Number of Visits 30    Progress Note Due on Visit 20    PT Start Time 1432    PT Stop Time 1515    PT Time Calculation (min) 43 min    Activity Tolerance Patient tolerated treatment well    Behavior During Therapy WFL for tasks assessed/performed             Past Medical History:  Diagnosis Date   Oral cancer (HCC)    Personal history of chemotherapy    Personal history of radiation therapy    Scleroderma (HCC)    Past Surgical History:  Procedure Laterality Date   ABDOMINAL HYSTERECTOMY     Patient Active Problem List   Diagnosis Date Noted   Other secondary kyphosis, cervicothoracic region 05/19/2023   Compression fracture of T3 vertebra (HCC) 05/19/2023   Compression fracture of C7 vertebra (HCC) 05/19/2023   Osteoporosis 05/19/2023    PCP: Sari Cunning, MD  REFERRING PROVIDER: Sari Cunning, MD   REFERRING DIAG: S12.600A (ICD-10-CM) - Unspecified displaced fracture of seventh cervical vertebra, initial encounter for closed fracture  THERAPY DIAG:  Cervicalgia  Muscle weakness (generalized)  Stiffness of right shoulder, not elsewhere classified  Rationale for Evaluation and Treatment: Rehabilitation  ONSET DATE: July 2024  SUBJECTIVE:                                                                                                                                                                                                         SUBJECTIVE STATEMENT: Pt reports no soreness following last session. Continued tightness  in her neck and R shoulder. No questions or concerns at start of session.   PERTINENT HISTORY:  Pt presents to PT w/ chronic limited cervical mobility 2/2 severe osteoporosis, scleroderma, and hx of cervical fibrosis of the neck. Since previous bout of PT patient had a fall in the restroom resulting in three fx vertebrae, fx of six ribs, and a fx of her R shoulder blade. Pt's current fx are being managed non-surgically. Pt reports no pain or sx of  N/T. She states she has the most difficulty with maintaining a seated position with no back support and is only able to maintain this position for 2-3 minutes before requiring back support. Pt also reports an increased fear of falling.   PAIN:  Are you having pain? No Main aggravation cannot sit up without back support, increased fear of falling.  Easing factors: having a supported back 2-3 minutes of sitting unsupported before needing support  PRECAUTIONS: Fall  RED FLAGS: None     WEIGHT BEARING RESTRICTIONS: No  FALLS:  Has patient fallen in last 6 months? Yes. Number of falls 1 and near falls  LIVING ENVIRONMENT: Has following equipment at home: Walker - 2 wheeled *Pt does not use RW often  OCCUPATION: Retired, after scleroderma dx  PLOF: Independent  PATIENT GOALS: To get moving again   NEXT MD VISIT: N/A  OBJECTIVE:  Note: Objective measures were completed at Evaluation unless otherwise noted.  DIAGNOSTIC FINDINGS:  CLINICAL DATA:  The patient suffered cervical and thoracic spine compression fractures in a fall 03/25/2023. Subsequent encounter.   EXAM: MRI CERVICAL SPINE WITHOUT CONTRAST   TECHNIQUE: Multiplanar, multisequence MR imaging of the cervical spine was performed. No intravenous contrast was administered.   COMPARISON:  CT chest 04/16/2023.   FINDINGS: Alignment: No listhesis. Mild kyphosis is centered about the T1 level.   Vertebrae: The patient has a superior endplate compression fracture of C7 with  vertebral body height loss of approximately 30%, a biconcave compression fracture of T1 vertebral body height loss of approximately 50% and a biconcave compression fracture T3 with vertebral body height loss centrally of approximately 80%. There is marrow edema within each of the vertebral bodies consistent with acute or early subacute injuries. Marrow edema is also seen in the right fifth and sixth ribs due to the fractures seen on the prior CT.   Cord: Normal signal throughout the visualized cord.   Posterior Fossa, vertebral arteries, paraspinal tissues: Negative.   IMPRESSION: 1. Acute or early subacute compression fractures of C7, T1 and T3 as described above. No retropulsion off the superior endplates of C7 or T1. The central spinal canal and neural foramina are open at all levels. 2. Marrow edema in the right fifth and sixth ribs due to the fractures seen on the prior CT.  PATIENT SURVEYS:  FOTO 48/57  COGNITION: Overall cognitive status: Within functional limits for tasks assessed  SENSATION: WFL  POSTURE: rounded shoulders and forward head  PALPATION: No TTP noted    CERVICAL ROM:   Active ROM A/PROM (deg) eval  Flexion 45 deg  Extension 10 deg  Right lateral flexion 40 deg  Left lateral flexion 35 deg*  Right rotation 40 deg  Left rotation 25 deg*   (Blank rows = not tested)  All cervical PROM= AROM   UPPER EXTREMITY ROM:  Active ROM Right eval Left eval  Shoulder flexion    Shoulder extension    Shoulder abduction 100 deg   Shoulder adduction    Shoulder extension    Shoulder internal rotation C7 C7  Shoulder external rotation Mid- thoracic Mid- thoracic  Elbow flexion    Elbow extension    Wrist flexion    Wrist extension    Wrist ulnar deviation    Wrist radial deviation    Wrist pronation    Wrist supination     (Blank rows = not tested)  UPPER EXTREMITY MMT:  MMT Right eval Left eval  Shoulder flexion 4 4-  Shoulder  extension     Shoulder abduction 4+ 4+  Shoulder adduction    Shoulder extension    Shoulder internal rotation 4 4  Shoulder external rotation 4 4  Middle trapezius    Lower trapezius    Elbow flexion 4+ 4+  Elbow extension 4+ 4+  Wrist flexion    Wrist extension    Wrist ulnar deviation    Wrist radial deviation    Wrist pronation    Wrist supination    Grip strength 4 4   (Blank rows = not tested)  CERVICAL SPECIAL TESTS:  Deferred 2/2 to limited cervical mobility  FUNCTIONAL TESTS:  DNF Endurance Test: Deferred to next session   SLS assessment RLE- 16 seconds LLE- 27 seconds w/ bilat UE support 07/08/23  TODAY'S TREATMENT: DATE: 01/12/24  There.Ex:  UBE, Seat 6 L5 2 min forward, 2 min backward for UE and thoracic warm up.  Standing B Scapular Row:  1 x 10 Blue TB  1 x 10 Black TB 1 x 10 Black TB  - Seated Rest break in between sets due to fatigue   Seated Shoulder Flexion to 90 with 90 of elbow flex.   2 x 10 YTB around wrist  Standing Elbow Flexion:  2 x 8 bilat. UE 3# DB    There.Act (In order to improve functional strength, core stability and over head strength for household ADLs):    Standard Plank for core and cervical stability   3 x 10s; VC for neutral spine   Forward Lunges with 2KB over head press:   2 x 8; CGA   Wall Sit with Shoulder Flexion 1# DB Bilat. (90-110)   2 x 8   PATIENT EDUCATION:  Education details: Education on Technique with exercises  Person educated: Patient Education method: Medical illustrator Education comprehension: verbalized understanding and returned demonstration  HOME EXERCISE PROGRAM: Access Code: [email or web address] URL: [email or web address] Date: 08/12/2023 Prepared by: Lyda Samples  Exercises - Supine Chin Tuck  - 1 x daily - 4-5 x weekly - 2 sets - 10 reps - Cervical Extension AROM with Strap  - 1 x daily - 4-5 x weekly - 2 sets - 10 reps - Seated Levator Scapulae Stretch  - 1 x daily - 4-5 x  weekly - 1 sets - 2 reps - 30 hold - Seated scapular retraction   - 1 x daily - 4-5 x weekly - 2 sets - 10 reps - Standing Tandem Balance with Counter Support  - 1 x daily - 3-4 x weekly - 1 sets - 3 reps - 30 hold - Standing Single Leg Stance with Counter Support  - 1 x daily - 3-4 x weekly - 1 sets - 3 reps - 30 hold   Access Code: [email or web address] URL: [email or web address] Date: 06/24/2023 Prepared by: Dawson Europe  Exercises - Supine Chin Tuck  - 1 x daily - 4-5 x weekly - 2 sets - 10 reps - Cervical Extension AROM with Strap  - 1 x daily - 4-5 x weekly - 2 sets - 10 reps - Seated Levator Scapulae Stretch  - 1 x daily - 4-5 x weekly - 1 sets - 2 reps - 30 hold - Seated scapular retraction   - 1 x daily - 4-5 x weekly - 2 sets - 10 reps  ASSESSMENT:  CLINICAL IMPRESSION: Pt arrived to OPPT with continued focus on addressing cervical, thoracic, and scapular mobility deficits. PT incorporated additional compound exercises; pt tolerated all exercises without report of  additional exercises. Intermittent seated rest breaks provided in between sets and activities due to moderate fatigue in bilat shoulders. She continues to be severely limited in cervical range of motion (all directions) limiting her full participation in household ADLs and community based activities. Additionally, the patient demonstrates R scapular dyskinesis and UE weakness warranting continued strengthening interventions. Due to insurance benefits PT discussed POC from x1/week to x1/biweekly in order to maintain progress throughout the year; pt agreeable to make change. Based on her current impairments the patient will still continue to benefit from skilled PT in order to maximize safety and facilitate return to PLOF.  OBJECTIVE IMPAIRMENTS: decreased mobility, decreased ROM, decreased strength, hypomobility, and impaired flexibility.   ACTIVITY LIMITATIONS: carrying and lifting  PARTICIPATION LIMITATIONS: community  activity  PERSONAL FACTORS: Age, Past/current experiences, Time since onset of injury/illness/exacerbation, and 3+ comorbidities: scleroderma, osteoporosis, hx of cancer  are also affecting patient's functional outcome.   REHAB POTENTIAL: Fair multi- comorbidities  CLINICAL DECISION MAKING: Evolving/moderate complexity  EVALUATION COMPLEXITY: Moderate   GOALS: Goals reviewed with patient? Yes  SHORT TERM GOALS: Target date: 12/01/23  Pt will be independent with HEP to improve cervical and shoulder strength with functional activities   Baseline: 06/24/23: HEP given to pt; 09/02/23: 25% compliance; 11/03/23: 25% compliance Goal status: ON GOING   LONG TERM GOALS: Target date: 01/26/24  Pt will improve FOTO to target score to demonstrate clinically significant improvement in functional mobility.   Baseline: 06/24/23: 46/58; 09/02/23: 47/58; 11/03/23: 50/58 Goal status: ONGOING  2.  Pt will improve all R/L shoulder MMT 4+ to improve functional mobility of bilat UE's.  Baseline: 06/24/23; 09/02/23; 11/03/23; MMT Right eval Left eval Left  09/02/23 Right 09/02/23 Right  11/03/23 Left 11/03/23  Shoulder flexion 4 4- 4- 4- 4 4+  Shoulder extension        Shoulder abduction 4+ 4+ 5 4+ 4+ 5  Shoulder adduction        Shoulder extension        Shoulder internal rotation 4 4 4+ 4+ 4 5  Shoulder external rotation 4 4 4+ 4+ 4 5   Goal status: PARTIALLY MET  3.  Pt will improve all cervical AROM to [facility] to improve functional mobility.  Baseline: 06/24/23:  Active ROM A/PROM (deg) eval A/PROM (Deg)  09/02/23 A/PROM (Deg)  11/03/23  Flexion 45 deg 45 45  Extension 10 deg 10 10  Right lateral flexion 40 deg 20 40  Left lateral flexion 35 deg* 35 40  Right rotation 40 deg 35 40  Left rotation 25 deg* 25 30   Goal status: ONGOING   PLAN:  PT FREQUENCY: 1x/week  PT DURATION: 12 weeks  PLANNED INTERVENTIONS: Therapeutic exercises, Therapeutic activity, Neuromuscular  re-education, Balance training, Gait training, Patient/Family education, Self Care, Joint mobilization, Spinal manipulation, Spinal mobilization, Cryotherapy, Moist heat, Manual therapy, and Re-evaluation  PLAN FOR NEXT SESSION: Cervical manual therapy/stretching. Progress postural strengthening exercises. Overhead head motions/strengthening. Compound movement patterns for cervicothoracic mobility and shoulder strength.  Satira Curet PT, DPT Physical Therapist- [facility] 01/12/24, 8:49 PM

## 2024-01-19 ENCOUNTER — Ambulatory Visit: Payer: Medicare Other

## 2024-01-19 DIAGNOSIS — M25611 Stiffness of right shoulder, not elsewhere classified: Secondary | ICD-10-CM

## 2024-01-19 DIAGNOSIS — M542 Cervicalgia: Secondary | ICD-10-CM | POA: Diagnosis not present

## 2024-01-19 DIAGNOSIS — M6281 Muscle weakness (generalized): Secondary | ICD-10-CM

## 2024-01-19 NOTE — Therapy (Signed)
 OUTPATIENT PHYSICAL THERAPY CERVICAL TREATMENT/RECERTIFICATION   Patient Name: Whitney Mclean MRN: [id number] DOB:1966/01/29, 58 y.o., female Today's Date: 01/19/2024  END OF SESSION:  PT End of Session - 01/19/24 1422     Visit Number 25    Number of Visits 42    Date for PT Re-Evaluation 04/12/24    Authorization Type 2025 - Calendar VL 30    Authorization Time Period 09/24/23-09/22/24    Authorization - Visit Number 15    Authorization - Number of Visits 30    Progress Note Due on Visit 20    PT Start Time 1430    PT Stop Time 1515    PT Time Calculation (min) 45 min    Activity Tolerance Patient tolerated treatment well    Behavior During Therapy WFL for tasks assessed/performed             Past Medical History:  Diagnosis Date   Oral cancer (HCC)    Personal history of chemotherapy    Personal history of radiation therapy    Scleroderma (HCC)    Past Surgical History:  Procedure Laterality Date   ABDOMINAL HYSTERECTOMY     Patient Active Problem List   Diagnosis Date Noted   Other secondary kyphosis, cervicothoracic region 05/19/2023   Compression fracture of T3 vertebra (HCC) 05/19/2023   Compression fracture of C7 vertebra (HCC) 05/19/2023   Osteoporosis 05/19/2023    PCP: Sari Cunning, MD  REFERRING PROVIDER: Sari Cunning, MD   REFERRING DIAG: S12.600A (ICD-10-CM) - Unspecified displaced fracture of seventh cervical vertebra, initial encounter for closed fracture  THERAPY DIAG:  Cervicalgia  Muscle weakness (generalized)  Stiffness of right shoulder, not elsewhere classified  Rationale for Evaluation and Treatment: Rehabilitation  ONSET DATE: July 2024  SUBJECTIVE:                                                                                                                                                                                                         SUBJECTIVE STATEMENT: Pt reports no soreness following last session.  Continued tightness in her neck and R shoulder. No questions or concerns at start of session.   PERTINENT HISTORY:  Pt presents to PT w/ chronic limited cervical mobility 2/2 severe osteoporosis, scleroderma, and hx of cervical fibrosis of the neck. Since previous bout of PT patient had a fall in the restroom resulting in three fx vertebrae, fx of six ribs, and a fx of her R shoulder blade. Pt's current fx are being managed non-surgically. Pt reports no pain or sx of  N/T. She states she has the most difficulty with maintaining a seated position with no back support and is only able to maintain this position for 2-3 minutes before requiring back support. Pt also reports an increased fear of falling.   PAIN:  Are you having pain? No Main aggravation cannot sit up without back support, increased fear of falling.  Easing factors: having a supported back 2-3 minutes of sitting unsupported before needing support  PRECAUTIONS: Fall  RED FLAGS: None     WEIGHT BEARING RESTRICTIONS: No  FALLS:  Has patient fallen in last 6 months? Yes. Number of falls 1 and near falls  LIVING ENVIRONMENT: Has following equipment at home: Walker - 2 wheeled *Pt does not use RW often  OCCUPATION: Retired, after scleroderma dx  PLOF: Independent  PATIENT GOALS: To get moving again   NEXT MD VISIT: N/A  OBJECTIVE:  Note: Objective measures were completed at Evaluation unless otherwise noted.  DIAGNOSTIC FINDINGS:  CLINICAL DATA:  The patient suffered cervical and thoracic spine compression fractures in a fall 03/25/2023. Subsequent encounter.   EXAM: MRI CERVICAL SPINE WITHOUT CONTRAST   TECHNIQUE: Multiplanar, multisequence MR imaging of the cervical spine was performed. No intravenous contrast was administered.   COMPARISON:  CT chest 04/16/2023.   FINDINGS: Alignment: No listhesis. Mild kyphosis is centered about the T1 level.   Vertebrae: The patient has a superior endplate compression  fracture of C7 with vertebral body height loss of approximately 30%, a biconcave compression fracture of T1 vertebral body height loss of approximately 50% and a biconcave compression fracture T3 with vertebral body height loss centrally of approximately 80%. There is marrow edema within each of the vertebral bodies consistent with acute or early subacute injuries. Marrow edema is also seen in the right fifth and sixth ribs due to the fractures seen on the prior CT.   Cord: Normal signal throughout the visualized cord.   Posterior Fossa, vertebral arteries, paraspinal tissues: Negative.   IMPRESSION: 1. Acute or early subacute compression fractures of C7, T1 and T3 as described above. No retropulsion off the superior endplates of C7 or T1. The central spinal canal and neural foramina are open at all levels. 2. Marrow edema in the right fifth and sixth ribs due to the fractures seen on the prior CT.  PATIENT SURVEYS:  FOTO 48/57  COGNITION: Overall cognitive status: Within functional limits for tasks assessed  SENSATION: WFL  POSTURE: rounded shoulders and forward head  PALPATION: No TTP noted    CERVICAL ROM:   Active ROM A/PROM (deg) eval  Flexion 45 deg  Extension 10 deg  Right lateral flexion 40 deg  Left lateral flexion 35 deg*  Right rotation 40 deg  Left rotation 25 deg*   (Blank rows = not tested)  All cervical PROM= AROM   UPPER EXTREMITY ROM:  Active ROM Right eval Left eval  Shoulder flexion    Shoulder extension    Shoulder abduction 100 deg   Shoulder adduction    Shoulder extension    Shoulder internal rotation C7 C7  Shoulder external rotation Mid- thoracic Mid- thoracic  Elbow flexion    Elbow extension    Wrist flexion    Wrist extension    Wrist ulnar deviation    Wrist radial deviation    Wrist pronation    Wrist supination     (Blank rows = not tested)  UPPER EXTREMITY MMT:  MMT Right eval Left eval  Shoulder flexion 4 4-  Shoulder extension    Shoulder abduction 4+ 4+  Shoulder adduction    Shoulder extension    Shoulder internal rotation 4 4  Shoulder external rotation 4 4  Middle trapezius    Lower trapezius    Elbow flexion 4+ 4+  Elbow extension 4+ 4+  Wrist flexion    Wrist extension    Wrist ulnar deviation    Wrist radial deviation    Wrist pronation    Wrist supination    Grip strength 4 4   (Blank rows = not tested)  CERVICAL SPECIAL TESTS:  Deferred 2/2 to limited cervical mobility  FUNCTIONAL TESTS:  DNF Endurance Test: Deferred to next session   SLS assessment RLE- 16 seconds LLE- 27 seconds w/ bilat UE support 07/08/23  TODAY'S TREATMENT: DATE: 01/19/24  There.Ex:  UBE, Seat 6 L5 2.5 min forward, 2.5 min backward for UE and thoracic warm up.  Standing Pallof Press against resistance  1 x 10 Blue TB, unable to maintain neutral torso rotation 3 x 10 Red TB , multimodal cues for neutral torso and UE placement    (Seated Rest break in between set)   There.Act (In order to improve functional strength, core stability and over head strength for household ADLs):   Sit to stand with bilat. Scaption against resistance  2 x 10, 2#DB BUE   (Seated Rest break in between set)   Wall Sit for core stabilization and quadricep strengthening.    3 x 15s , SBA   (Seated Rest break in between set)   Manual Therapy:  10 minutes total in supine for L cervical rotation, L lateral flexion, cervical extension. Passive technique with PT OP in each direction.   PATIENT EDUCATION:  Education details: Education on Technique with exercises  Person educated: Patient Education method: Medical illustrator Education comprehension: verbalized understanding and returned demonstration  HOME EXERCISE PROGRAM: Access Code: [email or web address] URL: [email or web address] Date: 01/19/2024 Prepared by: Satira Curet  Exercises - Supine Chin Tuck  - 1 x daily - 4-5 x weekly - 2 sets  - 10 reps - Cervical Extension AROM with Strap  - 1 x daily - 4-5 x weekly - 2 sets - 10 reps - Seated Levator Scapulae Stretch  - 1 x daily - 4-5 x weekly - 1 sets - 2 reps - 30 hold - Seated scapular retraction   - 1 x daily - 4-5 x weekly - 2 sets - 10 reps - Standing Tandem Balance with Counter Support  - 1 x daily - 3-4 x weekly - 1 sets - 3 reps - 30 hold - Standing Single Leg Stance with Counter Support  - 1 x daily - 3-4 x weekly - 1 sets - 3 reps - 30 hold - Wall Quarter Squat  - 1 x daily - 3-4 x weekly - 2-3 sets - 15 hold - Standard Plank  - 1 x daily - 3-4 x weekly - 2-3 sets - 15 hold - Scapular Retraction with Resistance  - 1 x daily - 7 x weekly - 2-3 sets - 10-12 reps - Shoulder External Rotation and Scapular Retraction with Resistance  - 1 x daily - 7 x weekly - 2-3 sets - 10-12 reps  Access Code: [email or web address] URL: [email or web address] Date: 08/12/2023 Prepared by: Lyda Samples  Exercises - Supine Chin Tuck  - 1 x daily - 4-5 x weekly - 2 sets - 10 reps - Cervical Extension AROM with Strap  - 1 x daily - 4-5 x  weekly - 2 sets - 10 reps - Seated Levator Scapulae Stretch  - 1 x daily - 4-5 x weekly - 1 sets - 2 reps - 30 hold - Seated scapular retraction   - 1 x daily - 4-5 x weekly - 2 sets - 10 reps - Standing Tandem Balance with Counter Support  - 1 x daily - 3-4 x weekly - 1 sets - 3 reps - 30 hold - Standing Single Leg Stance with Counter Support  - 1 x daily - 3-4 x weekly - 1 sets - 3 reps - 30 hold   Access Code: [email or web address] URL: [email or web address] Date: 06/24/2023 Prepared by: Dawson Europe  Exercises - Supine Chin Tuck  - 1 x daily - 4-5 x weekly - 2 sets - 10 reps - Cervical Extension AROM with Strap  - 1 x daily - 4-5 x weekly - 2 sets - 10 reps - Seated Levator Scapulae Stretch  - 1 x daily - 4-5 x weekly - 1 sets - 2 reps - 30 hold - Seated scapular retraction   - 1 x daily - 4-5 x weekly - 2 sets - 10  reps  ASSESSMENT:  CLINICAL IMPRESSION: Pt arrived to OPPT with continued focus on addressing, cervicothoracic and scapular mobility deficits. Time spent discussing POC with patient; she is agreeable to frequency of x1/biweekly. Intermittent seated rest breaks provided in between sets due to moderate fatigue in shoulders. Pt tolerated additional compound exercises without report of pain in cervical spine. Pt tolerated pallof press with TB focused on shoulder stabilization and core stabilization.  PT updated HEP in order to strengthen cervical spine and maintain progress. Patient's condition has the potential to improve in response to therapy. Maximum improvement is yet to be obtained. The anticipated improvement is attainable and reasonable in a generally predictable time. She continues to demonstrate deficits with global strength and severely limited cervical ROM limiting her ability to participate in recreational/community activities. Based on her current impairments, pt will continue to benefit from skilled physical therapy in order to maximize safety and facilitate return to PLOF.  OBJECTIVE IMPAIRMENTS: decreased mobility, decreased ROM, decreased strength, hypomobility, and impaired flexibility.   ACTIVITY LIMITATIONS: carrying and lifting  PARTICIPATION LIMITATIONS: community activity  PERSONAL FACTORS: Age, Past/current experiences, Time since onset of injury/illness/exacerbation, and 3+ comorbidities: scleroderma, osteoporosis, hx of cancer  are also affecting patient's functional outcome.   REHAB POTENTIAL: Fair multi- comorbidities  CLINICAL DECISION MAKING: Evolving/moderate complexity  EVALUATION COMPLEXITY: Moderate   GOALS: Goals reviewed with patient? Yes  SHORT TERM GOALS: Target date: 12/01/23  Pt will be independent with HEP to improve cervical and shoulder strength with functional activities   Baseline: 06/24/23: HEP given to pt; 09/02/23: 25% compliance; 11/03/23: 25%  compliance Goal status: ON GOING   LONG TERM GOALS: Target date: 04/12/2024  Pt will improve FOTO to target score to demonstrate clinically significant improvement in functional mobility.   Baseline: 06/24/23: 46/58; 09/02/23: 47/58; 11/03/23: 50/58 Goal status: DEFERRED  2.  Pt will improve all R/L shoulder MMT 4+ to improve functional mobility of bilat UE's.  Baseline: 06/24/23; 09/02/23; 11/03/23; MMT Right eval Left eval Left  09/02/23 Right 09/02/23 Right  11/03/23 Left 11/03/23  Shoulder flexion 4 4- 4- 4- 4 4+  Shoulder extension        Shoulder abduction 4+ 4+ 5 4+ 4+ 5  Shoulder adduction        Shoulder extension        Shoulder internal rotation  4 4 4+ 4+ 4 5  Shoulder external rotation 4 4 4+ 4+ 4 5   Goal status: PARTIALLY MET  3.  Pt will improve all cervical AROM to [facility] to improve functional mobility.  Baseline: 06/24/23:  Active ROM A/PROM (deg) eval A/PROM (Deg)  09/02/23 A/PROM (Deg)  11/03/23  Flexion 45 deg 45 45  Extension 10 deg 10 10  Right lateral flexion 40 deg 20 40  Left lateral flexion 35 deg* 35 40  Right rotation 40 deg 35 40  Left rotation 25 deg* 25 30   Goal status: ONGOING   PLAN:  PT FREQUENCY: 1x/week  PT DURATION: 12 weeks  PLANNED INTERVENTIONS: Therapeutic exercises, Therapeutic activity, Neuromuscular re-education, Balance training, Gait training, Patient/Family education, Self Care, Joint mobilization, Spinal manipulation, Spinal mobilization, Cryotherapy, Moist heat, Manual therapy, and Re-evaluation  PLAN FOR NEXT SESSION: Cervical manual therapy/stretching. Progress postural strengthening exercises. Overhead head motions/strengthening. Compound movement patterns for cervicothoracic mobility and shoulder strength.  Satira Curet PT, DPT Physical Therapist- [facility] 01/19/24, 9:47 PM

## 2024-01-26 ENCOUNTER — Ambulatory Visit: Payer: Medicare Other

## 2024-02-09 ENCOUNTER — Ambulatory Visit: Payer: Medicare Other | Attending: Internal Medicine

## 2024-02-09 DIAGNOSIS — M6281 Muscle weakness (generalized): Secondary | ICD-10-CM | POA: Insufficient documentation

## 2024-02-09 DIAGNOSIS — M542 Cervicalgia: Secondary | ICD-10-CM | POA: Insufficient documentation

## 2024-02-09 DIAGNOSIS — M25611 Stiffness of right shoulder, not elsewhere classified: Secondary | ICD-10-CM | POA: Diagnosis present

## 2024-02-09 NOTE — Therapy (Signed)
 OUTPATIENT PHYSICAL THERAPY CERVICAL TREATMENT/RECERTIFICATION   Patient Name: Whitney Mclean MRN: [id number] DOB:03/26/1966, 58 y.o., female Today's Date: 02/09/2024  END OF SESSION:  PT End of Session - 02/09/24 1432     Visit Number 26    Number of Visits 42    Date for PT Re-Evaluation 04/12/24    Authorization Type 2025 - Calendar VL 30    Authorization Time Period 09/24/23-09/22/24    Authorization - Visit Number 16    Authorization - Number of Visits 30    Progress Note Due on Visit 20    PT Start Time 1432    PT Stop Time 1510    PT Time Calculation (min) 38 min    Activity Tolerance Patient tolerated treatment well    Behavior During Therapy WFL for tasks assessed/performed             Past Medical History:  Diagnosis Date   Oral cancer (HCC)    Personal history of chemotherapy    Personal history of radiation therapy    Scleroderma (HCC)    Past Surgical History:  Procedure Laterality Date   ABDOMINAL HYSTERECTOMY     Patient Active Problem List   Diagnosis Date Noted   Other secondary kyphosis, cervicothoracic region 05/19/2023   Compression fracture of T3 vertebra (HCC) 05/19/2023   Compression fracture of C7 vertebra (HCC) 05/19/2023   Osteoporosis 05/19/2023    PCP: Sari Cunning, MD  REFERRING PROVIDER: Sari Cunning, MD   REFERRING DIAG: S12.600A (ICD-10-CM) - Unspecified displaced fracture of seventh cervical vertebra, initial encounter for closed fracture  THERAPY DIAG:  Cervicalgia  Muscle weakness (generalized)  Stiffness of right shoulder, not elsewhere classified  Rationale for Evaluation and Treatment: Rehabilitation  ONSET DATE: July 2024  SUBJECTIVE:                                                                                                                                                                                                         SUBJECTIVE STATEMENT: Pt reports continued soreness in R cervical  paraspinals and neck; pt's nurse advised her to find massage therapist specializing in myofascial release techniques. She would like PT to try myofascial release techniques in order to improve ROM and reduce tension. No questions or concerns.   PERTINENT HISTORY:  Pt presents to PT w/ chronic limited cervical mobility 2/2 severe osteoporosis, scleroderma, and hx of cervical fibrosis of the neck. Since previous bout of PT patient had a fall in the restroom resulting in three fx vertebrae, fx of six ribs, and  a fx of her R shoulder blade. Pt's current fx are being managed non-surgically. Pt reports no pain or sx of N/T. She states she has the most difficulty with maintaining a seated position with no back support and is only able to maintain this position for 2-3 minutes before requiring back support. Pt also reports an increased fear of falling.   PAIN:  Are you having pain? No Main aggravation cannot sit up without back support, increased fear of falling.  Easing factors: having a supported back 2-3 minutes of sitting unsupported before needing support  PRECAUTIONS: Fall  RED FLAGS: None     WEIGHT BEARING RESTRICTIONS: No  FALLS:  Has patient fallen in last 6 months? Yes. Number of falls 1 and near falls  LIVING ENVIRONMENT: Has following equipment at home: Walker - 2 wheeled *Pt does not use RW often  OCCUPATION: Retired, after scleroderma dx  PLOF: Independent  PATIENT GOALS: To get moving again   NEXT MD VISIT: N/A  OBJECTIVE:  Note: Objective measures were completed at Evaluation unless otherwise noted.  DIAGNOSTIC FINDINGS:  CLINICAL DATA:  The patient suffered cervical and thoracic spine compression fractures in a fall 03/25/2023. Subsequent encounter.   EXAM: MRI CERVICAL SPINE WITHOUT CONTRAST   TECHNIQUE: Multiplanar, multisequence MR imaging of the cervical spine was performed. No intravenous contrast was administered.   COMPARISON:  CT chest 04/16/2023.    FINDINGS: Alignment: No listhesis. Mild kyphosis is centered about the T1 level.   Vertebrae: The patient has a superior endplate compression fracture of C7 with vertebral body height loss of approximately 30%, a biconcave compression fracture of T1 vertebral body height loss of approximately 50% and a biconcave compression fracture T3 with vertebral body height loss centrally of approximately 80%. There is marrow edema within each of the vertebral bodies consistent with acute or early subacute injuries. Marrow edema is also seen in the right fifth and sixth ribs due to the fractures seen on the prior CT.   Cord: Normal signal throughout the visualized cord.   Posterior Fossa, vertebral arteries, paraspinal tissues: Negative.   IMPRESSION: 1. Acute or early subacute compression fractures of C7, T1 and T3 as described above. No retropulsion off the superior endplates of C7 or T1. The central spinal canal and neural foramina are open at all levels. 2. Marrow edema in the right fifth and sixth ribs due to the fractures seen on the prior CT.  PATIENT SURVEYS:  FOTO 48/57  COGNITION: Overall cognitive status: Within functional limits for tasks assessed  SENSATION: WFL  POSTURE: rounded shoulders and forward head  PALPATION: No TTP noted    CERVICAL ROM:   Active ROM A/PROM (deg) eval  Flexion 45 deg  Extension 10 deg  Right lateral flexion 40 deg  Left lateral flexion 35 deg*  Right rotation 40 deg  Left rotation 25 deg*   (Blank rows = not tested)  All cervical PROM= AROM   UPPER EXTREMITY ROM:  Active ROM Right eval Left eval  Shoulder flexion    Shoulder extension    Shoulder abduction 100 deg   Shoulder adduction    Shoulder extension    Shoulder internal rotation C7 C7  Shoulder external rotation Mid- thoracic Mid- thoracic  Elbow flexion    Elbow extension    Wrist flexion    Wrist extension    Wrist ulnar deviation    Wrist radial deviation     Wrist pronation    Wrist supination     (  Blank rows = not tested)  UPPER EXTREMITY MMT:  MMT Right eval Left eval  Shoulder flexion 4 4-  Shoulder extension    Shoulder abduction 4+ 4+  Shoulder adduction    Shoulder extension    Shoulder internal rotation 4 4  Shoulder external rotation 4 4  Middle trapezius    Lower trapezius    Elbow flexion 4+ 4+  Elbow extension 4+ 4+  Wrist flexion    Wrist extension    Wrist ulnar deviation    Wrist radial deviation    Wrist pronation    Wrist supination    Grip strength 4 4   (Blank rows = not tested)  CERVICAL SPECIAL TESTS:  Deferred 2/2 to limited cervical mobility  FUNCTIONAL TESTS:  DNF Endurance Test: Deferred to next session   SLS assessment RLE- 16 seconds LLE- 27 seconds w/ bilat UE support 07/08/23  TODAY'S TREATMENT: DATE: 02/09/2024  Therapeutic Activity (In order to improve functional strength, core stability and over head strength for household ADLs):   Standing Horizontal Abduction against resistance  2 x 10, Red TB  - Multimodal cues for technique and form  (Seated Rest break in b/t sets)   Standing Shoulder Row against resistance   2 x 10 Green TB   (Seated Rest break in b/t sets)   Sit To Stand with [employer] using med ball   3 x 10 with 3Kg Med ball  (Seated Rest break in between set)   Wall sit with forward press using 2kg med ball  3 x 10s hold    (Seated Rest break in between set)   Manual Therapy:   10 min myofascial release technique to R cervical paraspinals, upper trapezius, levator scapulae and cervical extensors. In order to increase tissue extensibility and improve ROM.   PATIENT EDUCATION:  Education details: Education on Technique with exercises  Person educated: Patient Education method: Medical illustrator Education comprehension: verbalized understanding and returned demonstration  HOME EXERCISE PROGRAM: Access Code: [email or web address] URL:  [email or web address] Date: 01/19/2024 Prepared by: Satira Curet  Exercises - Supine Chin Tuck  - 1 x daily - 4-5 x weekly - 2 sets - 10 reps - Cervical Extension AROM with Strap  - 1 x daily - 4-5 x weekly - 2 sets - 10 reps - Seated Levator Scapulae Stretch  - 1 x daily - 4-5 x weekly - 1 sets - 2 reps - 30 hold - Seated scapular retraction   - 1 x daily - 4-5 x weekly - 2 sets - 10 reps - Standing Tandem Balance with Counter Support  - 1 x daily - 3-4 x weekly - 1 sets - 3 reps - 30 hold - Standing Single Leg Stance with Counter Support  - 1 x daily - 3-4 x weekly - 1 sets - 3 reps - 30 hold - Wall Quarter Squat  - 1 x daily - 3-4 x weekly - 2-3 sets - 15 hold - Standard Plank  - 1 x daily - 3-4 x weekly - 2-3 sets - 15 hold - Scapular Retraction with Resistance  - 1 x daily - 7 x weekly - 2-3 sets - 10-12 reps - Shoulder External Rotation and Scapular Retraction with Resistance  - 1 x daily - 7 x weekly - 2-3 sets - 10-12 reps  Access Code: [email or web address] URL: [email or web address] Date: 08/12/2023 Prepared by: Lyda Samples  Exercises - Supine Chin Tuck  - 1 x daily - 4-5 x weekly - 2 sets -  10 reps - Cervical Extension AROM with Strap  - 1 x daily - 4-5 x weekly - 2 sets - 10 reps - Seated Levator Scapulae Stretch  - 1 x daily - 4-5 x weekly - 1 sets - 2 reps - 30 hold - Seated scapular retraction   - 1 x daily - 4-5 x weekly - 2 sets - 10 reps - Standing Tandem Balance with Counter Support  - 1 x daily - 3-4 x weekly - 1 sets - 3 reps - 30 hold - Standing Single Leg Stance with Counter Support  - 1 x daily - 3-4 x weekly - 1 sets - 3 reps - 30 hold   Access Code: [email or web address] URL: [email or web address] Date: 06/24/2023 Prepared by: Dawson Europe  Exercises - Supine Chin Tuck  - 1 x daily - 4-5 x weekly - 2 sets - 10 reps - Cervical Extension AROM with Strap  - 1 x daily - 4-5 x weekly - 2 sets - 10 reps - Seated Levator Scapulae Stretch  -  1 x daily - 4-5 x weekly - 1 sets - 2 reps - 30 hold - Seated scapular retraction   - 1 x daily - 4-5 x weekly - 2 sets - 10 reps  ASSESSMENT:  CLINICAL IMPRESSION: Continued PT POC with continued focus on addressing cervicothoracic and scapular mobility deficits. Intermittent seated rest breaks provided throughout session due to moderate fatigue and decreased activity tolerance. Time spent utilizing manual techniques in order to reduce tension and tightness in the R cervical paraspinals. PT utilized myofascial release techniques to patient's tolerance. She continues to demonstrate deficits with global strength and severely limited cervical ROM limiting her ability to participate in recreational/community activities. Based on her current impairments, pt will continue to benefit from skilled physical therapy in order to maximize safety and facilitate return to PLOF.  OBJECTIVE IMPAIRMENTS: decreased mobility, decreased ROM, decreased strength, hypomobility, and impaired flexibility.   ACTIVITY LIMITATIONS: carrying and lifting  PARTICIPATION LIMITATIONS: community activity  PERSONAL FACTORS: Age, Past/current experiences, Time since onset of injury/illness/exacerbation, and 3+ comorbidities: scleroderma, osteoporosis, hx of cancer are also affecting patient's functional outcome.   REHAB POTENTIAL: Fair multi- comorbidities  CLINICAL DECISION MAKING: Evolving/moderate complexity  EVALUATION COMPLEXITY: Moderate   GOALS: Goals reviewed with patient? Yes  SHORT TERM GOALS: Target date: 12/01/23  Pt will be independent with HEP to improve cervical and shoulder strength with functional activities   Baseline: 06/24/23: HEP given to pt; 09/02/23: 25% compliance; 11/03/23: 25% compliance Goal status: ON GOING   LONG TERM GOALS: Target date: 04/12/2024  Pt will improve FOTO to target score to demonstrate clinically significant improvement in functional mobility.   Baseline: 06/24/23: 46/58;  09/02/23: 47/58; 11/03/23: 50/58 Goal status: DEFERRED  2.  Pt will improve all R/L shoulder MMT 4+ to improve functional mobility of bilat UE's.  Baseline: 06/24/23; 09/02/23; 11/03/23; MMT Right eval Left eval Left  09/02/23 Right 09/02/23 Right  11/03/23 Left 11/03/23  Shoulder flexion 4 4- 4- 4- 4 4+  Shoulder extension        Shoulder abduction 4+ 4+ 5 4+ 4+ 5  Shoulder adduction        Shoulder extension        Shoulder internal rotation 4 4 4+ 4+ 4 5  Shoulder external rotation 4 4 4+ 4+ 4 5   Goal status: PARTIALLY MET  3.  Pt will improve all cervical AROM to [facility] to improve functional mobility.  Baseline: 06/24/23:  Active ROM A/PROM (deg) eval A/PROM (Deg)  09/02/23 A/PROM (Deg)  11/03/23  Flexion 45 deg 45 45  Extension 10 deg 10 10  Right lateral flexion 40 deg 20 40  Left lateral flexion 35 deg* 35 40  Right rotation 40 deg 35 40  Left rotation 25 deg* 25 30   Goal status: ONGOING   PLAN:  PT FREQUENCY: 1x/week  PT DURATION: 12 weeks  PLANNED INTERVENTIONS: Therapeutic exercises, Therapeutic activity, Neuromuscular re-education, Balance training, Gait training, Patient/Family education, Self Care, Joint mobilization, Spinal manipulation, Spinal mobilization, Cryotherapy, Moist heat, Manual therapy, and Re-evaluation  PLAN FOR NEXT SESSION: Cervical manual therapy/stretching. Progress postural strengthening exercises. Overhead head motions/strengthening. Compound movement patterns for cervicothoracic mobility and shoulder strength.  Satira Curet PT, DPT Physical Therapist- [facility] 02/09/24, 2:33 PM

## 2024-02-23 ENCOUNTER — Ambulatory Visit: Payer: Medicare Other | Attending: Internal Medicine

## 2024-02-23 DIAGNOSIS — M542 Cervicalgia: Secondary | ICD-10-CM | POA: Insufficient documentation

## 2024-02-23 DIAGNOSIS — M25611 Stiffness of right shoulder, not elsewhere classified: Secondary | ICD-10-CM | POA: Insufficient documentation

## 2024-02-23 DIAGNOSIS — M6281 Muscle weakness (generalized): Secondary | ICD-10-CM | POA: Diagnosis present

## 2024-02-23 NOTE — Therapy (Signed)
 OUTPATIENT PHYSICAL THERAPY CERVICAL TREATMENT/RECERTIFICATION   Patient Name: Whitney Mclean MRN: [id number] DOB:1966/08/16, 58 y.o., female Today's Date: 02/23/2024  END OF SESSION:  PT End of Session - 02/23/24 1433     Visit Number 27    Number of Visits 42    Date for PT Re-Evaluation 04/12/24    Authorization Type 2025 - Calendar VL 30    Authorization Time Period 09/24/23-09/22/24    Authorization - Number of Visits 30    Progress Note Due on Visit 20    PT Start Time 1431    PT Stop Time 1510    PT Time Calculation (min) 39 min    Activity Tolerance Patient tolerated treatment well    Behavior During Therapy WFL for tasks assessed/performed             Past Medical History:  Diagnosis Date   Oral cancer (HCC)    Personal history of chemotherapy    Personal history of radiation therapy    Scleroderma (HCC)    Past Surgical History:  Procedure Laterality Date   ABDOMINAL HYSTERECTOMY     Patient Active Problem List   Diagnosis Date Noted   Other secondary kyphosis, cervicothoracic region 05/19/2023   Compression fracture of T3 vertebra (HCC) 05/19/2023   Compression fracture of C7 vertebra (HCC) 05/19/2023   Osteoporosis 05/19/2023    PCP: Sari Cunning, MD  REFERRING PROVIDER: Sari Cunning, MD   REFERRING DIAG: S12.600A (ICD-10-CM) - Unspecified displaced fracture of seventh cervical vertebra, initial encounter for closed fracture  THERAPY DIAG:  Cervicalgia  Muscle weakness (generalized)  Rationale for Evaluation and Treatment: Rehabilitation  ONSET DATE: July 2024  SUBJECTIVE:                                                                                                                                                                                                         SUBJECTIVE STATEMENT: Pt reports minor soreness following last PT session and myofascial release techniques. Pt performed some HEP exercises since last session. Pt  able to perform wall sit ind for ~10 sec and will start implementing more resistance band exercises. No further questions or concerns.   PERTINENT HISTORY:  Pt presents to PT w/ chronic limited cervical mobility 2/2 severe osteoporosis, scleroderma, and hx of cervical fibrosis of the neck. Since previous bout of PT patient had a fall in the restroom resulting in three fx vertebrae, fx of six ribs, and a fx of her R shoulder blade. Pt's current fx are being managed non-surgically. Pt reports no  pain or sx of N/T. She states she has the most difficulty with maintaining a seated position with no back support and is only able to maintain this position for 2-3 minutes before requiring back support. Pt also reports an increased fear of falling.   PAIN:  Are you having pain? No Main aggravation cannot sit up without back support, increased fear of falling.  Easing factors: having a supported back 2-3 minutes of sitting unsupported before needing support  PRECAUTIONS: Fall  RED FLAGS: None     WEIGHT BEARING RESTRICTIONS: No  FALLS:  Has patient fallen in last 6 months? Yes. Number of falls 1 and near falls  LIVING ENVIRONMENT: Has following equipment at home: Walker - 2 wheeled *Pt does not use RW often  OCCUPATION: Retired, after scleroderma dx  PLOF: Independent  PATIENT GOALS: To get moving again   NEXT MD VISIT: N/A  OBJECTIVE:  Note: Objective measures were completed at Evaluation unless otherwise noted.  DIAGNOSTIC FINDINGS:  CLINICAL DATA:  The patient suffered cervical and thoracic spine compression fractures in a fall 03/25/2023. Subsequent encounter.   EXAM: MRI CERVICAL SPINE WITHOUT CONTRAST   TECHNIQUE: Multiplanar, multisequence MR imaging of the cervical spine was performed. No intravenous contrast was administered.   COMPARISON:  CT chest 04/16/2023.   FINDINGS: Alignment: No listhesis. Mild kyphosis is centered about the T1 level.   Vertebrae: The  patient has a superior endplate compression fracture of C7 with vertebral body height loss of approximately 30%, a biconcave compression fracture of T1 vertebral body height loss of approximately 50% and a biconcave compression fracture T3 with vertebral body height loss centrally of approximately 80%. There is marrow edema within each of the vertebral bodies consistent with acute or early subacute injuries. Marrow edema is also seen in the right fifth and sixth ribs due to the fractures seen on the prior CT.   Cord: Normal signal throughout the visualized cord.   Posterior Fossa, vertebral arteries, paraspinal tissues: Negative.   IMPRESSION: 1. Acute or early subacute compression fractures of C7, T1 and T3 as described above. No retropulsion off the superior endplates of C7 or T1. The central spinal canal and neural foramina are open at all levels. 2. Marrow edema in the right fifth and sixth ribs due to the fractures seen on the prior CT.  PATIENT SURVEYS:  FOTO 48/57  COGNITION: Overall cognitive status: Within functional limits for tasks assessed  SENSATION: WFL  POSTURE: rounded shoulders and forward head  PALPATION: No TTP noted    CERVICAL ROM:   Active ROM A/PROM (deg) eval  Flexion 45 deg  Extension 10 deg  Right lateral flexion 40 deg  Left lateral flexion 35 deg*  Right rotation 40 deg  Left rotation 25 deg*   (Blank rows = not tested)  All cervical PROM= AROM   UPPER EXTREMITY ROM:  Active ROM Right eval Left eval  Shoulder flexion    Shoulder extension    Shoulder abduction 100 deg   Shoulder adduction    Shoulder extension    Shoulder internal rotation C7 C7  Shoulder external rotation Mid- thoracic Mid- thoracic  Elbow flexion    Elbow extension    Wrist flexion    Wrist extension    Wrist ulnar deviation    Wrist radial deviation    Wrist pronation    Wrist supination     (Blank rows = not tested)  UPPER EXTREMITY MMT:  MMT  Right eval Left eval  Shoulder  flexion 4 4-  Shoulder extension    Shoulder abduction 4+ 4+  Shoulder adduction    Shoulder extension    Shoulder internal rotation 4 4  Shoulder external rotation 4 4  Middle trapezius    Lower trapezius    Elbow flexion 4+ 4+  Elbow extension 4+ 4+  Wrist flexion    Wrist extension    Wrist ulnar deviation    Wrist radial deviation    Wrist pronation    Wrist supination    Grip strength 4 4   (Blank rows = not tested)  CERVICAL SPECIAL TESTS:  Deferred 2/2 to limited cervical mobility  FUNCTIONAL TESTS:  DNF Endurance Test: Deferred to next session   SLS assessment RLE- 16 seconds LLE- 27 seconds w/ bilat UE support 07/08/23  TODAY'S TREATMENT: DATE: 02/23/2024  Therapeutic Activity (In order to improve functional strength, core stability and over head strength for household ADLs):  UBE x 5 min (2.5 fwd/retro dir) x level 8 for UE muscle warm up, strength and endurance; PT manually adjusted resistance.  Standing Horizontal Abduction against resistance  2 x 12, Red TB, PT supported elbow for proper form  (Seated Rest break in b/t sets)   Standing Shoulder Row against resistance   2 x 12 Blue  TB   (Seated Rest break in b/t sets)   Standing Chest Press into Shoulder press with med ball (2 kg)  2 x 10 ea dir - PT stabilized hips in order to reduce excessive lumbar ext    Standard Plan for core stabilization, cervical stability and shoulder stability  3 x 10s  1 x 10s (External Perturbations applied at hip) - Pt able to maintain balance. (Seated Rest break in between set)   Manual Therapy:   12 min myofascial release technique to R cervical paraspinals, upper trapezius, levator scapulae and cervical extensors. In order to increase tissue extensibility and improve ROM.   PATIENT EDUCATION:  Education details: Education on Technique with exercises  Person educated: Patient Education method: Software engineer Education comprehension: verbalized understanding and returned demonstration  HOME EXERCISE PROGRAM: Access Code: [email or web address] URL: [email or web address] Date: 01/19/2024 Prepared by: Satira Curet  Exercises - Supine Chin Tuck  - 1 x daily - 4-5 x weekly - 2 sets - 10 reps - Cervical Extension AROM with Strap  - 1 x daily - 4-5 x weekly - 2 sets - 10 reps - Seated Levator Scapulae Stretch  - 1 x daily - 4-5 x weekly - 1 sets - 2 reps - 30 hold - Seated scapular retraction   - 1 x daily - 4-5 x weekly - 2 sets - 10 reps - Standing Tandem Balance with Counter Support  - 1 x daily - 3-4 x weekly - 1 sets - 3 reps - 30 hold - Standing Single Leg Stance with Counter Support  - 1 x daily - 3-4 x weekly - 1 sets - 3 reps - 30 hold - Wall Quarter Squat  - 1 x daily - 3-4 x weekly - 2-3 sets - 15 hold - Standard Plank  - 1 x daily - 3-4 x weekly - 2-3 sets - 15 hold - Scapular Retraction with Resistance  - 1 x daily - 7 x weekly - 2-3 sets - 10-12 reps - Shoulder External Rotation and Scapular Retraction with Resistance  - 1 x daily - 7 x weekly - 2-3 sets - 10-12 reps  Access Code: [email or web address] URL: [email or web address] Date: 08/12/2023 Prepared by:  Milton Fairly  Exercises - Supine Chin Tuck  - 1 x daily - 4-5 x weekly - 2 sets - 10 reps - Cervical Extension AROM with Strap  - 1 x daily - 4-5 x weekly - 2 sets - 10 reps - Seated Levator Scapulae Stretch  - 1 x daily - 4-5 x weekly - 1 sets - 2 reps - 30 hold - Seated scapular retraction   - 1 x daily - 4-5 x weekly - 2 sets - 10 reps - Standing Tandem Balance with Counter Support  - 1 x daily - 3-4 x weekly - 1 sets - 3 reps - 30 hold - Standing Single Leg Stance with Counter Support  - 1 x daily - 3-4 x weekly - 1 sets - 3 reps - 30 hold   Access Code: [email or web address] URL: [email or web address] Date: 06/24/2023 Prepared by: Dawson Europe  Exercises - Supine Chin Tuck  - 1 x daily - 4-5 x  weekly - 2 sets - 10 reps - Cervical Extension AROM with Strap  - 1 x daily - 4-5 x weekly - 2 sets - 10 reps - Seated Levator Scapulae Stretch  - 1 x daily - 4-5 x weekly - 1 sets - 2 reps - 30 hold - Seated scapular retraction   - 1 x daily - 4-5 x weekly - 2 sets - 10 reps  ASSESSMENT:  CLINICAL IMPRESSION: Continued PT POC with continued focus on addressing cervicothoracic and scapular mobility deficits. Intermittent seated rest breaks provided throughout session due to moderate fatigue and decreased activity tolerance.  Pt tolerated increase in intenisity with UE parascapular strengthening. Myofascial release utilized in order to improve stiffness and tightness in musculature. Good ability to perform standard planks without tactile cues from PT.  She continues to demonstrate deficits with global strength and severely limited cervical ROM limiting her ability to participate in recreational/community activities. Pt will present with decline in ability to perform functional task without utilization of PT interventions. Based on her current impairments, pt will continue to benefit from skilled physical therapy in order to maximize safety and facilitate return to PLOF.  OBJECTIVE IMPAIRMENTS: decreased mobility, decreased ROM, decreased strength, hypomobility, and impaired flexibility.   ACTIVITY LIMITATIONS: carrying and lifting  PARTICIPATION LIMITATIONS: community activity  PERSONAL FACTORS: Age, Past/current experiences, Time since onset of injury/illness/exacerbation, and 3+ comorbidities: scleroderma, osteoporosis, hx of cancer are also affecting patient's functional outcome.   REHAB POTENTIAL: Fair multi- comorbidities  CLINICAL DECISION MAKING: Evolving/moderate complexity  EVALUATION COMPLEXITY: Moderate   GOALS: Goals reviewed with patient? Yes  SHORT TERM GOALS: Target date: 12/01/23  Pt will be independent with HEP to improve cervical and shoulder strength with functional  activities   Baseline: 06/24/23: HEP given to pt; 09/02/23: 25% compliance; 11/03/23: 25% compliance Goal status: ON GOING   LONG TERM GOALS: Target date: 04/12/2024  Pt will improve FOTO to target score to demonstrate clinically significant improvement in functional mobility.   Baseline: 06/24/23: 46/58; 09/02/23: 47/58; 11/03/23: 50/58 Goal status: DEFERRED  2.  Pt will improve all R/L shoulder MMT 4+ to improve functional mobility of bilat UE's.  Baseline: 06/24/23; 09/02/23; 11/03/23; MMT Right eval Left eval Left  09/02/23 Right 09/02/23 Right  11/03/23 Left 11/03/23  Shoulder flexion 4 4- 4- 4- 4 4+  Shoulder extension        Shoulder abduction 4+ 4+ 5 4+ 4+ 5  Shoulder adduction        Shoulder extension  Shoulder internal rotation 4 4 4+ 4+ 4 5  Shoulder external rotation 4 4 4+ 4+ 4 5   Goal status: PARTIALLY MET  3.  Pt will improve all cervical AROM to [facility] to improve functional mobility.  Baseline: 06/24/23:  Active ROM A/PROM (deg) eval A/PROM (Deg)  09/02/23 A/PROM (Deg)  11/03/23  Flexion 45 deg 45 45  Extension 10 deg 10 10  Right lateral flexion 40 deg 20 40  Left lateral flexion 35 deg* 35 40  Right rotation 40 deg 35 40  Left rotation 25 deg* 25 30   Goal status: ONGOING   PLAN:  PT FREQUENCY: 1x/week  PT DURATION: 12 weeks  PLANNED INTERVENTIONS: Therapeutic exercises, Therapeutic activity, Neuromuscular re-education, Balance training, Gait training, Patient/Family education, Self Care, Joint mobilization, Spinal manipulation, Spinal mobilization, Cryotherapy, Moist heat, Manual therapy, and Re-evaluation  PLAN FOR NEXT SESSION: Cervical manual therapy/stretching. Progress postural strengthening exercises. Overhead head motions/strengthening. Compound movement patterns for cervicothoracic mobility and shoulder strength.  Satira Curet PT, DPT Physical Therapist- [facility] 02/23/24, 2:34 PM

## 2024-03-03 ENCOUNTER — Encounter

## 2024-03-11 ENCOUNTER — Ambulatory Visit

## 2024-03-11 DIAGNOSIS — M542 Cervicalgia: Secondary | ICD-10-CM | POA: Diagnosis not present

## 2024-03-11 DIAGNOSIS — M6281 Muscle weakness (generalized): Secondary | ICD-10-CM

## 2024-03-11 DIAGNOSIS — M25611 Stiffness of right shoulder, not elsewhere classified: Secondary | ICD-10-CM

## 2024-03-11 NOTE — Therapy (Signed)
 OUTPATIENT PHYSICAL THERAPY CERVICAL TREATMENT   Patient Name: Whitney Mclean MRN: [id number] DOB:November 27, 1965, 58 y.o., female Today's Date: 03/11/2024  END OF SESSION:  PT End of Session - 03/11/24 1304     Visit Number 28    Number of Visits 42    Date for PT Re-Evaluation 04/12/24    Authorization Type 2025 - Calendar VL 30    Authorization Time Period 09/24/23-09/22/24    Authorization - Number of Visits 30    Progress Note Due on Visit 20    PT Start Time 1301    PT Stop Time 1345    PT Time Calculation (min) 44 min    Activity Tolerance Patient tolerated treatment well    Behavior During Therapy WFL for tasks assessed/performed           Past Medical History:  Diagnosis Date   Oral cancer (HCC)    Personal history of chemotherapy    Personal history of radiation therapy    Scleroderma (HCC)    Past Surgical History:  Procedure Laterality Date   ABDOMINAL HYSTERECTOMY     Patient Active Problem List   Diagnosis Date Noted   Other secondary kyphosis, cervicothoracic region 05/19/2023   Compression fracture of T3 vertebra (HCC) 05/19/2023   Compression fracture of C7 vertebra (HCC) 05/19/2023   Osteoporosis 05/19/2023    PCP: Sari Cunning, MD  REFERRING PROVIDER: Sari Cunning, MD   REFERRING DIAG: S12.600A (ICD-10-CM) - Unspecified displaced fracture of seventh cervical vertebra, initial encounter for closed fracture  THERAPY DIAG:  Cervicalgia  Muscle weakness (generalized)  Stiffness of right shoulder, not elsewhere classified  Rationale for Evaluation and Treatment: Rehabilitation  ONSET DATE: July 2024  SUBJECTIVE:                                                                                                                                                                                                         SUBJECTIVE STATEMENT: Patient reports no pain in the neck but stiffness and cervical ROM has not improved.  Patient reports that  she hasn't done HEP; feels like her shoulder getting weaker. No further questions or concerns.   PERTINENT HISTORY:  Pt presents to PT w/ chronic limited cervical mobility 2/2 severe osteoporosis, scleroderma, and hx of cervical fibrosis of the neck. Since previous bout of PT patient had a fall in the restroom resulting in three fx vertebrae, fx of six ribs, and a fx of her R shoulder blade. Pt's current fx are being managed non-surgically. Pt reports no pain or sx  of N/T. She states she has the most difficulty with maintaining a seated position with no back support and is only able to maintain this position for 2-3 minutes before requiring back support. Pt also reports an increased fear of falling.   PAIN:  Are you having pain? No Main aggravation cannot sit up without back support, increased fear of falling.  Easing factors: having a supported back 2-3 minutes of sitting unsupported before needing support  PRECAUTIONS: Fall  RED FLAGS: None     WEIGHT BEARING RESTRICTIONS: No  FALLS:  Has patient fallen in last 6 months? Yes. Number of falls 1 and near falls  LIVING ENVIRONMENT: Has following equipment at home: Walker - 2 wheeled *Pt does not use RW often  OCCUPATION: Retired, after scleroderma dx  PLOF: Independent  PATIENT GOALS: To get moving again   NEXT MD VISIT: N/A  OBJECTIVE:  Note: Objective measures were completed at Evaluation unless otherwise noted.  DIAGNOSTIC FINDINGS:  CLINICAL DATA:  The patient suffered cervical and thoracic spine compression fractures in a fall 03/25/2023. Subsequent encounter.   EXAM: MRI CERVICAL SPINE WITHOUT CONTRAST   TECHNIQUE: Multiplanar, multisequence MR imaging of the cervical spine was performed. No intravenous contrast was administered.   COMPARISON:  CT chest 04/16/2023.   FINDINGS: Alignment: No listhesis. Mild kyphosis is centered about the T1 level.   Vertebrae: The patient has a superior endplate compression  fracture of C7 with vertebral body height loss of approximately 30%, a biconcave compression fracture of T1 vertebral body height loss of approximately 50% and a biconcave compression fracture T3 with vertebral body height loss centrally of approximately 80%. There is marrow edema within each of the vertebral bodies consistent with acute or early subacute injuries. Marrow edema is also seen in the right fifth and sixth ribs due to the fractures seen on the prior CT.   Cord: Normal signal throughout the visualized cord.   Posterior Fossa, vertebral arteries, paraspinal tissues: Negative.   IMPRESSION: 1. Acute or early subacute compression fractures of C7, T1 and T3 as described above. No retropulsion off the superior endplates of C7 or T1. The central spinal canal and neural foramina are open at all levels. 2. Marrow edema in the right fifth and sixth ribs due to the fractures seen on the prior CT.  PATIENT SURVEYS:  FOTO 48/57  COGNITION: Overall cognitive status: Within functional limits for tasks assessed  SENSATION: WFL  POSTURE: rounded shoulders and forward head  PALPATION: No TTP noted    CERVICAL ROM:   Active ROM A/PROM (deg) eval  Flexion 45 deg  Extension 10 deg  Right lateral flexion 40 deg  Left lateral flexion 35 deg*  Right rotation 40 deg  Left rotation 25 deg*   (Blank rows = not tested)  All cervical PROM= AROM   UPPER EXTREMITY ROM:  Active ROM Right eval Left eval  Shoulder flexion    Shoulder extension    Shoulder abduction 100 deg   Shoulder adduction    Shoulder extension    Shoulder internal rotation C7 C7  Shoulder external rotation Mid- thoracic Mid- thoracic  Elbow flexion    Elbow extension    Wrist flexion    Wrist extension    Wrist ulnar deviation    Wrist radial deviation    Wrist pronation    Wrist supination     (Blank rows = not tested)  UPPER EXTREMITY MMT:  MMT Right eval Left eval  Shoulder flexion 4 4-  Shoulder extension    Shoulder abduction 4+ 4+  Shoulder adduction    Shoulder extension    Shoulder internal rotation 4 4  Shoulder external rotation 4 4  Middle trapezius    Lower trapezius    Elbow flexion 4+ 4+  Elbow extension 4+ 4+  Wrist flexion    Wrist extension    Wrist ulnar deviation    Wrist radial deviation    Wrist pronation    Wrist supination    Grip strength 4 4   (Blank rows = not tested)  CERVICAL SPECIAL TESTS:  Deferred 2/2 to limited cervical mobility  FUNCTIONAL TESTS:  DNF Endurance Test: Deferred to next session   SLS assessment RLE- 16 seconds LLE- 27 seconds w/ bilat UE support 07/08/23  TODAY'S TREATMENT: DATE: 03/11/2024  Therapeutic Exercise  UBE x 4 min (2.5 fwd/retro dir) x level 8 for UE muscle warm up, strength and endurance; PT manually adjusted resistance.  Seated Arm Circle   2 x 10, CW, CCW   Seated Scaption against resistance   2 x 10, 2# DB    Standing Elbow Flexion Against resistance   2 x 10, Yellow TB  Shoulder Abduction with 1# DB   2 x 10, 1# DB   Standing Horizontal Abduction against resistance  2 x 10, Red TB, PT supported elbow for proper form   Manual Therapy:   10 min myofascial release technique to R cervical paraspinals, upper trapezius, levator scapulae and cervical extensors. In order to increase tissue extensibility and improve ROM.   PATIENT EDUCATION:  Education details: Education on Technique with exercises  Person educated: Patient Education method: Medical illustrator Education comprehension: verbalized understanding and returned demonstration  HOME EXERCISE PROGRAM: Access Code: [email or web address] URL: [email or web address] Date: 01/19/2024 Prepared by: Satira Curet  Exercises - Supine Chin Tuck  - 1 x daily - 4-5 x weekly - 2 sets - 10 reps - Cervical Extension AROM with Strap  - 1 x daily - 4-5 x weekly - 2 sets - 10 reps - Seated Levator Scapulae Stretch  - 1 x daily  - 4-5 x weekly - 1 sets - 2 reps - 30 hold - Seated scapular retraction   - 1 x daily - 4-5 x weekly - 2 sets - 10 reps - Standing Tandem Balance with Counter Support  - 1 x daily - 3-4 x weekly - 1 sets - 3 reps - 30 hold - Standing Single Leg Stance with Counter Support  - 1 x daily - 3-4 x weekly - 1 sets - 3 reps - 30 hold - Wall Quarter Squat  - 1 x daily - 3-4 x weekly - 2-3 sets - 15 hold - Standard Plank  - 1 x daily - 3-4 x weekly - 2-3 sets - 15 hold - Scapular Retraction with Resistance  - 1 x daily - 7 x weekly - 2-3 sets - 10-12 reps - Shoulder External Rotation and Scapular Retraction with Resistance  - 1 x daily - 7 x weekly - 2-3 sets - 10-12 reps  Access Code: [email or web address] URL: [email or web address] Date: 08/12/2023 Prepared by: Lyda Samples  Exercises - Supine Chin Tuck  - 1 x daily - 4-5 x weekly - 2 sets - 10 reps - Cervical Extension AROM with Strap  - 1 x daily - 4-5 x weekly - 2 sets - 10 reps - Seated Levator Scapulae Stretch  - 1 x daily - 4-5 x weekly - 1 sets - 2 reps -  30 hold - Seated scapular retraction   - 1 x daily - 4-5 x weekly - 2 sets - 10 reps - Standing Tandem Balance with Counter Support  - 1 x daily - 3-4 x weekly - 1 sets - 3 reps - 30 hold - Standing Single Leg Stance with Counter Support  - 1 x daily - 3-4 x weekly - 1 sets - 3 reps - 30 hold   Access Code: [email or web address] URL: [email or web address] Date: 06/24/2023 Prepared by: Dawson Europe  Exercises - Supine Chin Tuck  - 1 x daily - 4-5 x weekly - 2 sets - 10 reps - Cervical Extension AROM with Strap  - 1 x daily - 4-5 x weekly - 2 sets - 10 reps - Seated Levator Scapulae Stretch  - 1 x daily - 4-5 x weekly - 1 sets - 2 reps - 30 hold - Seated scapular retraction   - 1 x daily - 4-5 x weekly - 2 sets - 10 reps  ASSESSMENT:  CLINICAL IMPRESSION: Continued PT POC with continued focus on addressing cervicothoracic and scapular mobility deficits. Although patient  tolerated all interventions PT provided Intermittent seated rest breaks provided throughout session due to moderate fatigue and decreased activity tolerance. PT focused interventions on parascapular and global UE strengthening. Myofascial release utilized in order to improve stiffness and tightness in musculature; little improvements in cervical ROM following intervention. She continues to demonstrate deficits with global strength and severely limited cervical ROM limiting her ability to participate in recreational/community activities. Pt will present with decline in ability to perform functional tasks without utilization of PT interventions. Based on her current impairments, pt will continue to benefit from skilled physical therapy in order to maximize safety and facilitate return to PLOF.  OBJECTIVE IMPAIRMENTS: decreased mobility, decreased ROM, decreased strength, hypomobility, and impaired flexibility.   ACTIVITY LIMITATIONS: carrying and lifting  PARTICIPATION LIMITATIONS: community activity  PERSONAL FACTORS: Age, Past/current experiences, Time since onset of injury/illness/exacerbation, and 3+ comorbidities: scleroderma, osteoporosis, hx of cancer are also affecting patient's functional outcome.   REHAB POTENTIAL: Fair multi- comorbidities  CLINICAL DECISION MAKING: Evolving/moderate complexity  EVALUATION COMPLEXITY: Moderate   GOALS: Goals reviewed with patient? Yes  SHORT TERM GOALS: Target date: 12/01/23  Pt will be independent with HEP to improve cervical and shoulder strength with functional activities   Baseline: 06/24/23: HEP given to pt; 09/02/23: 25% compliance; 11/03/23: 25% compliance Goal status: ON GOING   LONG TERM GOALS: Target date: 04/12/2024  Pt will improve FOTO to target score to demonstrate clinically significant improvement in functional mobility.   Baseline: 06/24/23: 46/58; 09/02/23: 47/58; 11/03/23: 50/58 Goal status: DEFERRED  2.  Pt will improve all  R/L shoulder MMT 4+ to improve functional mobility of bilat UE's.  Baseline: 06/24/23; 09/02/23; 11/03/23; MMT Right eval Left eval Left  09/02/23 Right 09/02/23 Right  11/03/23 Left 11/03/23  Shoulder flexion 4 4- 4- 4- 4 4+  Shoulder extension        Shoulder abduction 4+ 4+ 5 4+ 4+ 5  Shoulder adduction        Shoulder extension        Shoulder internal rotation 4 4 4+ 4+ 4 5  Shoulder external rotation 4 4 4+ 4+ 4 5   Goal status: PARTIALLY MET  3.  Pt will improve all cervical AROM to [facility] to improve functional mobility.  Baseline: 06/24/23:  Active ROM A/PROM (deg) eval A/PROM (Deg)  09/02/23 A/PROM (Deg)  11/03/23  Flexion 45 deg 45  45  Extension 10 deg 10 10  Right lateral flexion 40 deg 20 40  Left lateral flexion 35 deg* 35 40  Right rotation 40 deg 35 40  Left rotation 25 deg* 25 30   Goal status: ONGOING   PLAN:  PT FREQUENCY: 1x/week  PT DURATION: 12 weeks  PLANNED INTERVENTIONS: Therapeutic exercises, Therapeutic activity, Neuromuscular re-education, Balance training, Gait training, Patient/Family education, Self Care, Joint mobilization, Spinal manipulation, Spinal mobilization, Cryotherapy, Moist heat, Manual therapy, and Re-evaluation  PLAN FOR NEXT SESSION: Cervical manual therapy/stretching. Progress postural strengthening exercises. Overhead head motions/strengthening. Compound movement patterns for cervicothoracic mobility and shoulder strength.  Satira Curet PT, DPT Physical Therapist- [facility] 03/11/24, 11:06 PM

## 2024-03-18 ENCOUNTER — Encounter

## 2024-03-24 ENCOUNTER — Other Ambulatory Visit: Payer: Self-pay | Admitting: Pulmonary Disease

## 2024-03-24 DIAGNOSIS — M349 Systemic sclerosis, unspecified: Secondary | ICD-10-CM

## 2024-03-24 DIAGNOSIS — S2220XS Unspecified fracture of sternum, sequela: Secondary | ICD-10-CM

## 2024-03-24 DIAGNOSIS — Z8781 Personal history of (healed) traumatic fracture: Secondary | ICD-10-CM

## 2024-03-24 DIAGNOSIS — J432 Centrilobular emphysema: Secondary | ICD-10-CM

## 2024-03-24 DIAGNOSIS — Z87891 Personal history of nicotine dependence: Secondary | ICD-10-CM

## 2024-03-30 ENCOUNTER — Encounter

## 2024-04-02 ENCOUNTER — Ambulatory Visit
Admission: RE | Admit: 2024-04-02 | Discharge: 2024-04-02 | Disposition: A | Discharge status: Home / Self Care | Source: Ambulatory Visit | Attending: Pulmonary Disease | Admitting: Pulmonary Disease

## 2024-04-02 DIAGNOSIS — Z87891 Personal history of nicotine dependence: Secondary | ICD-10-CM | POA: Diagnosis present

## 2024-04-02 DIAGNOSIS — S8292XS Unspecified fracture of left lower leg, sequela: Secondary | ICD-10-CM | POA: Diagnosis present

## 2024-04-02 DIAGNOSIS — S2220XS Unspecified fracture of sternum, sequela: Secondary | ICD-10-CM | POA: Diagnosis present

## 2024-04-02 DIAGNOSIS — Z8781 Personal history of (healed) traumatic fracture: Secondary | ICD-10-CM | POA: Insufficient documentation

## 2024-04-02 DIAGNOSIS — M349 Systemic sclerosis, unspecified: Secondary | ICD-10-CM | POA: Insufficient documentation

## 2024-04-02 DIAGNOSIS — S8291XS Unspecified fracture of right lower leg, sequela: Secondary | ICD-10-CM | POA: Diagnosis present

## 2024-04-02 DIAGNOSIS — J432 Centrilobular emphysema: Secondary | ICD-10-CM | POA: Insufficient documentation

## 2024-04-06 ENCOUNTER — Ambulatory Visit: Attending: Internal Medicine

## 2024-04-06 DIAGNOSIS — M6281 Muscle weakness (generalized): Secondary | ICD-10-CM | POA: Insufficient documentation

## 2024-04-06 DIAGNOSIS — M79641 Pain in right hand: Secondary | ICD-10-CM | POA: Insufficient documentation

## 2024-04-06 DIAGNOSIS — M25611 Stiffness of right shoulder, not elsewhere classified: Secondary | ICD-10-CM | POA: Diagnosis present

## 2024-04-06 DIAGNOSIS — M542 Cervicalgia: Secondary | ICD-10-CM | POA: Insufficient documentation

## 2024-04-06 NOTE — Therapy (Signed)
 OUTPATIENT PHYSICAL THERAPY CERVICAL TREATMENT   Patient Name: Whitney Mclean MRN: [id number] DOB:04-25-1966, 58 y.o., female Today's Date: 04/06/2024  END OF SESSION:  PT End of Session - 04/06/24 1259     Visit Number 29    Number of Visits 42    Date for PT Re-Evaluation 04/12/24    Authorization Type 2025 - Calendar VL 30    Authorization Time Period 09/24/23-09/22/24    Authorization - Number of Visits 30    Progress Note Due on Visit 20    PT Start Time 1300    PT Stop Time 1340    PT Time Calculation (min) 40 min    Activity Tolerance Patient tolerated treatment well    Behavior During Therapy WFL for tasks assessed/performed            Past Medical History:  Diagnosis Date   Oral cancer (HCC)    Personal history of chemotherapy    Personal history of radiation therapy    Scleroderma (HCC)    Past Surgical History:  Procedure Laterality Date   ABDOMINAL HYSTERECTOMY     Patient Active Problem List   Diagnosis Date Noted   Other secondary kyphosis, cervicothoracic region 05/19/2023   Compression fracture of T3 vertebra (HCC) 05/19/2023   Compression fracture of C7 vertebra (HCC) 05/19/2023   Osteoporosis 05/19/2023    PCP: Oneil PHEBE Pinal, MD  REFERRING PROVIDER: Oneil PHEBE Pinal, MD   REFERRING DIAG: S12.600A (ICD-10-CM) - Unspecified displaced fracture of seventh cervical vertebra, initial encounter for closed fracture  THERAPY DIAG:  Cervicalgia  Muscle weakness (generalized)  Stiffness of right shoulder, not elsewhere classified  Pain in right hand  Rationale for Evaluation and Treatment: Rehabilitation  ONSET DATE: July 2024  SUBJECTIVE:                                                                                                                                                                                                         SUBJECTIVE STATEMENT: Patient reports no pain at start of the session. No new reports since the last PT  session. Still with continued limited cervical ROM . No further questions or concerns.   PERTINENT HISTORY:  Pt presents to PT w/ chronic limited cervical mobility 2/2 severe osteoporosis, scleroderma, and hx of cervical fibrosis of the neck. Since previous bout of PT patient had a fall in the restroom resulting in three fx vertebrae, fx of six ribs, and a fx of her R shoulder blade. Pt's current fx are being managed non-surgically. Pt reports no pain or  sx of N/T. She states she has the most difficulty with maintaining a seated position with no back support and is only able to maintain this position for 2-3 minutes before requiring back support. Pt also reports an increased fear of falling.   PAIN:  Are you having pain? No Main aggravation cannot sit up without back support, increased fear of falling.  Easing factors: having a supported back 2-3 minutes of sitting unsupported before needing support  PRECAUTIONS: Fall  RED FLAGS: None     WEIGHT BEARING RESTRICTIONS: No  FALLS:  Has patient fallen in last 6 months? Yes. Number of falls 1 and near falls  LIVING ENVIRONMENT: Has following equipment at home: Walker - 2 wheeled *Pt does not use RW often  OCCUPATION: Retired, after scleroderma dx  PLOF: Independent  PATIENT GOALS: To get moving again   NEXT MD VISIT: N/A  OBJECTIVE:  Note: Objective measures were completed at Evaluation unless otherwise noted.  DIAGNOSTIC FINDINGS:  CLINICAL DATA:  The patient suffered cervical and thoracic spine compression fractures in a fall 03/25/2023. Subsequent encounter.   EXAM: MRI CERVICAL SPINE WITHOUT CONTRAST   TECHNIQUE: Multiplanar, multisequence MR imaging of the cervical spine was performed. No intravenous contrast was administered.   COMPARISON:  CT chest 04/16/2023.   FINDINGS: Alignment: No listhesis. Mild kyphosis is centered about the T1 level.   Vertebrae: The patient has a superior endplate compression  fracture of C7 with vertebral body height loss of approximately 30%, a biconcave compression fracture of T1 vertebral body height loss of approximately 50% and a biconcave compression fracture T3 with vertebral body height loss centrally of approximately 80%. There is marrow edema within each of the vertebral bodies consistent with acute or early subacute injuries. Marrow edema is also seen in the right fifth and sixth ribs due to the fractures seen on the prior CT.   Cord: Normal signal throughout the visualized cord.   Posterior Fossa, vertebral arteries, paraspinal tissues: Negative.   IMPRESSION: 1. Acute or early subacute compression fractures of C7, T1 and T3 as described above. No retropulsion off the superior endplates of C7 or T1. The central spinal canal and neural foramina are open at all levels. 2. Marrow edema in the right fifth and sixth ribs due to the fractures seen on the prior CT.  PATIENT SURVEYS:  FOTO 48/57  COGNITION: Overall cognitive status: Within functional limits for tasks assessed  SENSATION: WFL  POSTURE: rounded shoulders and forward head  PALPATION: No TTP noted    CERVICAL ROM:   Active ROM A/PROM (deg) eval  Flexion 45 deg  Extension 10 deg  Right lateral flexion 40 deg  Left lateral flexion 35 deg*  Right rotation 40 deg  Left rotation 25 deg*   (Blank rows = not tested)  All cervical PROM= AROM   UPPER EXTREMITY ROM:  Active ROM Right eval Left eval  Shoulder flexion    Shoulder extension    Shoulder abduction 100 deg   Shoulder adduction    Shoulder extension    Shoulder internal rotation C7 C7  Shoulder external rotation Mid- thoracic Mid- thoracic  Elbow flexion    Elbow extension    Wrist flexion    Wrist extension    Wrist ulnar deviation    Wrist radial deviation    Wrist pronation    Wrist supination     (Blank rows = not tested)  UPPER EXTREMITY MMT:  MMT Right eval Left eval  Shoulder flexion 4 4-  Shoulder extension    Shoulder abduction 4+ 4+  Shoulder adduction    Shoulder extension    Shoulder internal rotation 4 4  Shoulder external rotation 4 4  Middle trapezius    Lower trapezius    Elbow flexion 4+ 4+  Elbow extension 4+ 4+  Wrist flexion    Wrist extension    Wrist ulnar deviation    Wrist radial deviation    Wrist pronation    Wrist supination    Grip strength 4 4   (Blank rows = not tested)  CERVICAL SPECIAL TESTS:  Deferred 2/2 to limited cervical mobility  FUNCTIONAL TESTS:  DNF Endurance Test: Deferred to next session   SLS assessment RLE- 16 seconds LLE- 27 seconds w/ bilat UE support 07/08/23  TODAY'S TREATMENT: DATE: 04/06/2024  Therapeutic Exercise:  UBE x 4 min (2.5 fwd/retro dir) x level 8-6 for UE muscle warm up, strength and endurance; PT manually adjusted resistance.  Seated Arm Circle (Added to HEP)  1 x 10, CW CCW dir   1 x 10, 1# DB in CW, CCW dir  Standing Shoulder Abd against resistance  2 x 8 3# DB    Seated Barbell [employer]   From R Shoulder 2 x 8, 25#   From L Shoulder 2 x 8, 25#  Shoulder Abduction with 1# DB   2 x 10, 1# DB   Standing Shoulder horizontal abduction at 90 of shoulder flexion    R/L 3 x 10, YTB around wrist   ROM Reassessment:   Active ROM A/PROM   Flexion 35  Extension 15  Right lateral flexion 30  Left lateral flexion 20  Right rotation 40  Left rotation 25    MMT Reassessment: (See below at goals)   Manual Therapy (10 min unbilled):   10 min myofascial release technique to R cervical paraspinals, upper trapezius, levator scapulae and cervical extensors. In order to increase tissue extensibility and improve ROM.   PATIENT EDUCATION:  Education details: Education on Technique with exercises  Person educated: Patient Education method: Medical illustrator Education comprehension: verbalized understanding and returned demonstration  HOME EXERCISE PROGRAM: Access Code:  [email or web address] URL: [email or web address] Date: 01/19/2024 Prepared by: Lonni Pall  Exercises - Supine Chin Tuck  - 1 x daily - 4-5 x weekly - 2 sets - 10 reps - Cervical Extension AROM with Strap  - 1 x daily - 4-5 x weekly - 2 sets - 10 reps - Seated Levator Scapulae Stretch  - 1 x daily - 4-5 x weekly - 1 sets - 2 reps - 30 hold - Seated scapular retraction   - 1 x daily - 4-5 x weekly - 2 sets - 10 reps - Standing Tandem Balance with Counter Support  - 1 x daily - 3-4 x weekly - 1 sets - 3 reps - 30 hold - Standing Single Leg Stance with Counter Support  - 1 x daily - 3-4 x weekly - 1 sets - 3 reps - 30 hold - Wall Quarter Squat  - 1 x daily - 3-4 x weekly - 2-3 sets - 15 hold - Standard Plank  - 1 x daily - 3-4 x weekly - 2-3 sets - 15 hold - Scapular Retraction with Resistance  - 1 x daily - 7 x weekly - 2-3 sets - 10-12 reps - Shoulder External Rotation and Scapular Retraction with Resistance  - 1 x daily - 7 x weekly - 2-3 sets - 10-12 reps  Access  Code: [email or web address] URL: [email or web address] Date: 08/12/2023 Prepared by: Dorina Kingfisher  Exercises - Supine Chin Tuck  - 1 x daily - 4-5 x weekly - 2 sets - 10 reps - Cervical Extension AROM with Strap  - 1 x daily - 4-5 x weekly - 2 sets - 10 reps - Seated Levator Scapulae Stretch  - 1 x daily - 4-5 x weekly - 1 sets - 2 reps - 30 hold - Seated scapular retraction   - 1 x daily - 4-5 x weekly - 2 sets - 10 reps - Standing Tandem Balance with Counter Support  - 1 x daily - 3-4 x weekly - 1 sets - 3 reps - 30 hold - Standing Single Leg Stance with Counter Support  - 1 x daily - 3-4 x weekly - 1 sets - 3 reps - 30 hold   Access Code: [email or web address] URL: [email or web address] Date: 06/24/2023 Prepared by: Sidra Simpers  Exercises - Supine Chin Tuck  - 1 x daily - 4-5 x weekly - 2 sets - 10 reps - Cervical Extension AROM with Strap  - 1 x daily - 4-5 x weekly - 2 sets - 10 reps - Seated Levator  Scapulae Stretch  - 1 x daily - 4-5 x weekly - 1 sets - 2 reps - 30 hold - Seated scapular retraction   - 1 x daily - 4-5 x weekly - 2 sets - 10 reps  ASSESSMENT:  CLINICAL IMPRESSION: Continued PT POC with continued focus on addressing cervicothoracic and scapular mobility deficits. Manual techniques utilized in order to address upper neck stiffness and tightness in musculature; little improvement in cervical ROM. PT focused on parascapular strengthening in order to improve upper neck posture. She tolerated lifting barbell for a landmine press without report of additional pain in the r Shoulder. She continues to demonstrate deficits with global strength and severely limited cervical ROM limiting her ability to participate in recreational/community activities. Pt will present with decline in ability to perform functional tasks without utilization of PT interventions. Based on her current impairments, pt will continue to benefit from skilled physical therapy in order to maximize safety and facilitate return to PLOF.  OBJECTIVE IMPAIRMENTS: decreased mobility, decreased ROM, decreased strength, hypomobility, and impaired flexibility.   ACTIVITY LIMITATIONS: carrying and lifting  PARTICIPATION LIMITATIONS: community activity  PERSONAL FACTORS: Age, Past/current experiences, Time since onset of injury/illness/exacerbation, and 3+ comorbidities: scleroderma, osteoporosis, hx of cancer are also affecting patient's functional outcome.   REHAB POTENTIAL: Fair multi- comorbidities  CLINICAL DECISION MAKING: Evolving/moderate complexity  EVALUATION COMPLEXITY: Moderate   GOALS: Goals reviewed with patient? Yes  SHORT TERM GOALS: Target date: 12/01/23  Pt will be independent with HEP to improve cervical and shoulder strength with functional activities   Baseline: 06/24/23: HEP given to pt; 09/02/23: 25% compliance; 11/03/23: 25% compliance Goal status: ON GOING   LONG TERM GOALS: Target date:  04/12/2024  Pt will improve FOTO to target score to demonstrate clinically significant improvement in functional mobility.   Baseline: 06/24/23: 46/58; 09/02/23: 47/58; 11/03/23: 50/58 Goal status: DEFERRED  2.  Pt will improve all R/L shoulder MMT 4+ to improve functional mobility of bilat UE's.  Baseline: 06/24/23; 09/02/23; 11/03/23; MMT Right eval Left eval Left  09/02/23 Right 09/02/23 Right  11/03/23 Left 11/03/23 Right  04/06/24 Left  04/06/24  Shoulder flexion 4 4- 4- 4- 4 4+ 4- 4  Shoulder extension          Shoulder abduction 4+ 4+ 5 4+ 4+ 5  5 5  Shoulder adduction          Shoulder extension          Shoulder internal rotation 4 4 4+ 4+ 4 5 4 5   Shoulder external rotation 4 4 4+ 4+ 4 5 4 5    Goal status: PARTIALLY MET  3.  Pt will improve all cervical AROM to [facility] to improve functional mobility.  Baseline: 06/24/23:  Active ROM A/PROM (deg) eval A/PROM (Deg)  09/02/23 A/PROM (Deg)  11/03/23 A/PROM (Deg)  04/06/24  Flexion 45 deg 45 45 35  Extension 10 deg 10 10 15   Right lateral flexion 40 deg 20 40 30  Left lateral flexion 35 deg* 35 40 20  Right rotation 40 deg 35 40 40  Left rotation 25 deg* 25 30 25    Goal status: ONGOING   PLAN:  PT FREQUENCY: 1x/week  PT DURATION: 12 weeks  PLANNED INTERVENTIONS: Therapeutic exercises, Therapeutic activity, Neuromuscular re-education, Balance training, Gait training, Patient/Family education, Self Care, Joint mobilization, Spinal manipulation, Spinal mobilization, Cryotherapy, Moist heat, Manual therapy, and Re-evaluation  PLAN FOR NEXT SESSION: Cervical manual therapy/stretching. Progress postural strengthening exercises. Overhead head motions/strengthening. Compound movement patterns for cervicothoracic mobility and shoulder strength.  Lonni Pall PT, DPT Physical Therapist- [facility] 04/06/24, 1:08 PM

## 2024-04-13 ENCOUNTER — Encounter

## 2024-04-27 ENCOUNTER — Encounter

## 2024-05-03 ENCOUNTER — Ambulatory Visit: Attending: Internal Medicine

## 2024-05-03 DIAGNOSIS — M6281 Muscle weakness (generalized): Secondary | ICD-10-CM | POA: Insufficient documentation

## 2024-05-03 DIAGNOSIS — M542 Cervicalgia: Secondary | ICD-10-CM | POA: Diagnosis present

## 2024-05-03 DIAGNOSIS — M25611 Stiffness of right shoulder, not elsewhere classified: Secondary | ICD-10-CM | POA: Diagnosis present

## 2024-05-03 NOTE — Therapy (Signed)
 OUTPATIENT PHYSICAL THERAPY CERVICAL TREATMENT/RECERTIFICATION   Patient Name: Whitney Mclean MRN: [id number] DOB:21-Sep-1966, 58 y.o., female Today's Date: 05/03/2024  END OF SESSION:  PT End of Session - 05/03/24 1301     Visit Number 30    Number of Visits 50    Date for PT Re-Evaluation 06/28/24    Authorization Type 2025 - Calendar VL 30    Authorization Time Period 09/24/23-09/22/24    Authorization - Visit Number 19    Authorization - Number of Visits 30    Progress Note Due on Visit 40    PT Start Time 1302    PT Stop Time 1340    PT Time Calculation (min) 38 min    Activity Tolerance Patient tolerated treatment well    Behavior During Therapy WFL for tasks assessed/performed            Past Medical History:  Diagnosis Date   Oral cancer (HCC)    Personal history of chemotherapy    Personal history of radiation therapy    Scleroderma (HCC)    Past Surgical History:  Procedure Laterality Date   ABDOMINAL HYSTERECTOMY     Patient Active Problem List   Diagnosis Date Noted   Other secondary kyphosis, cervicothoracic region 05/19/2023   Compression fracture of T3 vertebra (HCC) 05/19/2023   Compression fracture of C7 vertebra (HCC) 05/19/2023   Osteoporosis 05/19/2023    PCP: Oneil PHEBE Pinal, MD  REFERRING PROVIDER: Oneil PHEBE Pinal, MD   REFERRING DIAG: S12.600A (ICD-10-CM) - Unspecified displaced fracture of seventh cervical vertebra, initial encounter for closed fracture  THERAPY DIAG:  Cervicalgia  Muscle weakness (generalized)  Stiffness of right shoulder, not elsewhere classified  Rationale for Evaluation and Treatment: Rehabilitation  ONSET DATE: July 2024  SUBJECTIVE:                                                                                                                                                                                                         SUBJECTIVE STATEMENT: 05/03/24: Patient with report of 0/10 NPS in the  upper neck. Still with continued limited cervical ROM . No further questions or concerns.   PERTINENT HISTORY:  Pt presents to PT w/ chronic limited cervical mobility 2/2 severe osteoporosis, scleroderma, and hx of cervical fibrosis of the neck. Since previous bout of PT patient had a fall in the restroom resulting in three fx vertebrae, fx of six ribs, and a fx of her R shoulder blade. Pt's current fx are being managed non-surgically. Pt reports no pain or sx of N/T.  She states she has the most difficulty with maintaining a seated position with no back support and is only able to maintain this position for 2-3 minutes before requiring back support. Pt also reports an increased fear of falling.   PAIN:  Are you having pain? No Main aggravation cannot sit up without back support, increased fear of falling.  Easing factors: having a supported back 2-3 minutes of sitting unsupported before needing support  PRECAUTIONS: Fall  RED FLAGS: None     WEIGHT BEARING RESTRICTIONS: No  FALLS:  Has patient fallen in last 6 months? Yes. Number of falls 1 and near falls  LIVING ENVIRONMENT: Has following equipment at home: Walker - 2 wheeled *Pt does not use RW often  OCCUPATION: Retired, after scleroderma dx  PLOF: Independent  PATIENT GOALS: To get moving again   NEXT MD VISIT: N/A  OBJECTIVE:  Note: Objective measures were completed at Evaluation unless otherwise noted.  DIAGNOSTIC FINDINGS:  CLINICAL DATA:  The patient suffered cervical and thoracic spine compression fractures in a fall 03/25/2023. Subsequent encounter.   EXAM: MRI CERVICAL SPINE WITHOUT CONTRAST   TECHNIQUE: Multiplanar, multisequence MR imaging of the cervical spine was performed. No intravenous contrast was administered.   COMPARISON:  CT chest 04/16/2023.   FINDINGS: Alignment: No listhesis. Mild kyphosis is centered about the T1 level.   Vertebrae: The patient has a superior endplate compression  fracture of C7 with vertebral body height loss of approximately 30%, a biconcave compression fracture of T1 vertebral body height loss of approximately 50% and a biconcave compression fracture T3 with vertebral body height loss centrally of approximately 80%. There is marrow edema within each of the vertebral bodies consistent with acute or early subacute injuries. Marrow edema is also seen in the right fifth and sixth ribs due to the fractures seen on the prior CT.   Cord: Normal signal throughout the visualized cord.   Posterior Fossa, vertebral arteries, paraspinal tissues: Negative.   IMPRESSION: 1. Acute or early subacute compression fractures of C7, T1 and T3 as described above. No retropulsion off the superior endplates of C7 or T1. The central spinal canal and neural foramina are open at all levels. 2. Marrow edema in the right fifth and sixth ribs due to the fractures seen on the prior CT.  PATIENT SURVEYS:  FOTO 48/57  COGNITION: Overall cognitive status: Within functional limits for tasks assessed  SENSATION: WFL  POSTURE: rounded shoulders and forward head  PALPATION: No TTP noted    CERVICAL ROM:   Active ROM A/PROM (deg) eval  Flexion 45 deg  Extension 10 deg  Right lateral flexion 40 deg  Left lateral flexion 35 deg*  Right rotation 40 deg  Left rotation 25 deg*   (Blank rows = not tested)  All cervical PROM= AROM   UPPER EXTREMITY ROM:  Active ROM Right eval Left eval  Shoulder flexion    Shoulder extension    Shoulder abduction 100 deg   Shoulder adduction    Shoulder extension    Shoulder internal rotation C7 C7  Shoulder external rotation Mid- thoracic Mid- thoracic  Elbow flexion    Elbow extension    Wrist flexion    Wrist extension    Wrist ulnar deviation    Wrist radial deviation    Wrist pronation    Wrist supination     (Blank rows = not tested)  UPPER EXTREMITY MMT:  MMT Right eval Left eval  Shoulder flexion 4 4-   Shoulder  extension    Shoulder abduction 4+ 4+  Shoulder adduction    Shoulder extension    Shoulder internal rotation 4 4  Shoulder external rotation 4 4  Middle trapezius    Lower trapezius    Elbow flexion 4+ 4+  Elbow extension 4+ 4+  Wrist flexion    Wrist extension    Wrist ulnar deviation    Wrist radial deviation    Wrist pronation    Wrist supination    Grip strength 4 4   (Blank rows = not tested)  CERVICAL SPECIAL TESTS:  Deferred 2/2 to limited cervical mobility  FUNCTIONAL TESTS:  DNF Endurance Test: Deferred to next session   SLS assessment RLE- 16 seconds LLE- 27 seconds w/ bilat UE support 07/08/23  TODAY'S TREATMENT: DATE: 05/03/24  PPM (5 Min Unbilled):   ROM Reassessment:   Active ROM A/PROM   Flexion 35  Extension < 10  Right lateral flexion 30  Left lateral flexion 20  Right rotation 35  Left rotation 30    MMT Reassessment: (See below at goals)   Therapeutic Activity: UBE x 5 min (2.5 fwd/retro dir) x level 8-6 for UE muscle warm up, strength and endurance; PT manually adjusted resistance to patient tolerance.   Seated Arm Circles    2 x 20 - CW/CCW ea direction  - Wide Diameter for deltoid strengthening/endurance   Seated Shoulder Roll for parascapular strengthening and mobility   1 x 10 - Reverse   Seated Barbell [employer] for upward reaching and strength n  R/L: 3 x 8 - 25# Barbell  Standing Shoulder flexion within available range  R/L: 2 x 10 - 2# DB   OH reaching to various targets on wall (Dual Tasking - PT calls out target) - for UE strength and facilitation of cervical extension   R/L: 2 x 10 - 2# DB   Manual Therapy (8 min unbilled):   Myofascial release technique to R cervical paraspinals, upper trapezius, levator scapulae and cervical extensors. In order to increase tissue extensibility and improve ROM.   Manual L Cervical Rotation Stretch  30s/bout x 2 in order to improve ROM and tissue  extensibility  Manual L Cervical Side Bending Stretch  30s/bout x 2 in order to improve ROM and tissue extensibility  Manual L Extension Stretch  30s/bout x 2 in order to improve ROM and tissue extensibility    PATIENT EDUCATION:  Education details: Education on Technique with exercises  Person educated: Patient Education method: Medical illustrator Education comprehension: verbalized understanding and returned demonstration  HOME EXERCISE PROGRAM: Access Code: [email or web address] URL: [email or web address] Date: 01/19/2024 Prepared by: Lonni Pall  Exercises - Supine Chin Tuck  - 1 x daily - 4-5 x weekly - 2 sets - 10 reps - Cervical Extension AROM with Strap  - 1 x daily - 4-5 x weekly - 2 sets - 10 reps - Seated Levator Scapulae Stretch  - 1 x daily - 4-5 x weekly - 1 sets - 2 reps - 30 hold - Seated scapular retraction   - 1 x daily - 4-5 x weekly - 2 sets - 10 reps - Standing Tandem Balance with Counter Support  - 1 x daily - 3-4 x weekly - 1 sets - 3 reps - 30 hold - Standing Single Leg Stance with Counter Support  - 1 x daily - 3-4 x weekly - 1 sets - 3 reps - 30 hold - Wall Quarter Squat  - 1 x daily -  3-4 x weekly - 2-3 sets - 15 hold - Standard Plank  - 1 x daily - 3-4 x weekly - 2-3 sets - 15 hold - Scapular Retraction with Resistance  - 1 x daily - 7 x weekly - 2-3 sets - 10-12 reps - Shoulder External Rotation and Scapular Retraction with Resistance  - 1 x daily - 7 x weekly - 2-3 sets - 10-12 reps  Access Code: [email or web address] URL: [email or web address] Date: 08/12/2023 Prepared by: Dorina Kingfisher  Exercises - Supine Chin Tuck  - 1 x daily - 4-5 x weekly - 2 sets - 10 reps - Cervical Extension AROM with Strap  - 1 x daily - 4-5 x weekly - 2 sets - 10 reps - Seated Levator Scapulae Stretch  - 1 x daily - 4-5 x weekly - 1 sets - 2 reps - 30 hold - Seated scapular retraction   - 1 x daily - 4-5 x weekly - 2 sets - 10 reps - Standing Tandem  Balance with Counter Support  - 1 x daily - 3-4 x weekly - 1 sets - 3 reps - 30 hold - Standing Single Leg Stance with Counter Support  - 1 x daily - 3-4 x weekly - 1 sets - 3 reps - 30 hold   Access Code: [email or web address] URL: [email or web address] Date: 06/24/2023 Prepared by: Sidra Simpers  Exercises - Supine Chin Tuck  - 1 x daily - 4-5 x weekly - 2 sets - 10 reps - Cervical Extension AROM with Strap  - 1 x daily - 4-5 x weekly - 2 sets - 10 reps - Seated Levator Scapulae Stretch  - 1 x daily - 4-5 x weekly - 1 sets - 2 reps - 30 hold - Seated scapular retraction   - 1 x daily - 4-5 x weekly - 2 sets - 10 reps  ASSESSMENT:  CLINICAL IMPRESSION: Continued PT POC with continued focus on addressing cervicothoracic and scapular mobility deficits. Manual techniques utilized in order to address upper neck stiffness and tightness in musculature; little improvement in cervical ROM. PT focused on deltoid strengthening in order to improve upward reaching/lifting objects. Good demonstration of lifting small objects however quickly fatigued after several repetitions. Her cervical ROM continues to be significantly limited due to myofascial restrictions in the R upper neck. Her strength testing with a minor regression in shoulder abduction. PT encouraged adherence to HEP and daily shoulder mobility. Patient's condition has the potential to improve in response to therapy. Maximum improvement is yet to be obtained. The anticipated improvement is attainable and reasonable in a generally predictable time. PT requesting an additional 8 more weeks; 1x/week bi weekly in order to address deficits. PT strongly recommending continued skilled PT; without PT interventions patient is at high risk for further injury and decrease in QoL.   OBJECTIVE IMPAIRMENTS: decreased mobility, decreased ROM, decreased strength, hypomobility, and impaired flexibility.   ACTIVITY LIMITATIONS: carrying and  lifting  PARTICIPATION LIMITATIONS: community activity  PERSONAL FACTORS: Age, Past/current experiences, Time since onset of injury/illness/exacerbation, and 3+ comorbidities: scleroderma, osteoporosis, hx of cancer are also affecting patient's functional outcome.   REHAB POTENTIAL: Fair multi- comorbidities  CLINICAL DECISION MAKING: Evolving/moderate complexity  EVALUATION COMPLEXITY: Moderate   GOALS: Goals reviewed with patient? Yes  SHORT TERM GOALS: Target date: 12/01/23  Pt will be independent with HEP to improve cervical and shoulder strength with functional activities   Baseline: 06/24/23: HEP given to pt; 09/02/23: 25% compliance; 11/03/23: 25% compliance; 05/03/2024: < 50% adherence (limited  to just AROM) Goal status: ON GOING   LONG TERM GOALS: Target date: 06/28/2024  Pt will improve FOTO to target score to demonstrate clinically significant improvement in functional mobility.   Baseline: 06/24/23: 46/58; 09/02/23: 47/58; 11/03/23: 50/58 Goal status: DEFERRED  2.  Pt will improve all R/L shoulder MMT 4+ to improve functional mobility of bilat UE's.  Baseline: 06/24/23; 09/02/23; 11/03/23; MMT Right eval Left eval Left  09/02/23 Right 09/02/23 Right  11/03/23 Left 11/03/23 Right  04/06/24 Left  04/06/24 R/L 05/03/24  Shoulder flexion 4 4- 4- 4- 4 4+ 4- 4 4/5  Shoulder extension           Shoulder abduction 4+ 4+ 5 4+ 4+ 5 5 5  4-/4+  Shoulder adduction           Shoulder extension           Shoulder internal rotation 4 4 4+ 4+ 4 5 4 5  5/5  Shoulder external rotation 4 4 4+ 4+ 4 5 4 5  5/5           Goal status: PARTIALLY MET  3.  Pt will improve all cervical AROM to [facility] to improve functional mobility.  Baseline: 06/24/23:  Active ROM A/PROM (deg) eval A/PROM (Deg)  09/02/23 A/PROM (Deg)  11/03/23 A/PROM (Deg)  04/06/24 A/PROM  (Deg) 05/03/2024  Flexion 45 deg 45 45 35 35  Extension 10 deg 10 10 15  < 10  Right lateral flexion 40 deg 20 40 30 30  Left  lateral flexion 35 deg* 35 40 20 20  Right rotation 40 deg 35 40 40 35  Left rotation 25 deg* 25 30 25 30    Goal status: PARTIALLY MET  PLAN:  PT FREQUENCY: 1x/week  PT DURATION: 8 weeks (bi weekly schedule)   PLANNED INTERVENTIONS: Therapeutic exercises, Therapeutic activity, Neuromuscular re-education, Balance training, Gait training, Patient/Family education, Self Care, Joint mobilization, Spinal manipulation, Spinal mobilization, Cryotherapy, Moist heat, Manual therapy, and Re-evaluation  PLAN FOR NEXT SESSION: Cervical manual therapy/stretching. Progress postural strengthening exercises. Overhead head motions/strengthening. Compound movement patterns for cervicothoracic mobility and shoulder strength.  Lonni Pall PT, DPT Physical Therapist- [facility] 05/03/24, 1:48 PM

## 2024-05-11 ENCOUNTER — Encounter

## 2024-05-17 ENCOUNTER — Ambulatory Visit

## 2024-05-17 DIAGNOSIS — M6281 Muscle weakness (generalized): Secondary | ICD-10-CM

## 2024-05-17 DIAGNOSIS — M542 Cervicalgia: Secondary | ICD-10-CM

## 2024-05-17 DIAGNOSIS — M25611 Stiffness of right shoulder, not elsewhere classified: Secondary | ICD-10-CM

## 2024-05-17 NOTE — Therapy (Signed)
 OUTPATIENT PHYSICAL THERAPY CERVICAL TREATMENT   Patient Name: Whitney Mclean MRN: [id number] DOB:07/26/1966, 58 y.o., female Today's Date: 05/17/2024  END OF SESSION:  PT End of Session - 05/17/24 1309     Visit Number 31    Number of Visits 50    Date for PT Re-Evaluation 06/28/24    Authorization Type 2025 - Calendar VL 30    Authorization Time Period 09/24/23-09/22/24    Authorization - Number of Visits 30    Progress Note Due on Visit 40    PT Start Time 1305    PT Stop Time 1345    PT Time Calculation (min) 40 min    Activity Tolerance Patient tolerated treatment well    Behavior During Therapy WFL for tasks assessed/performed             Past Medical History:  Diagnosis Date   Oral cancer (HCC)    Personal history of chemotherapy    Personal history of radiation therapy    Scleroderma (HCC)    Past Surgical History:  Procedure Laterality Date   ABDOMINAL HYSTERECTOMY     Patient Active Problem List   Diagnosis Date Noted   Other secondary kyphosis, cervicothoracic region 05/19/2023   Compression fracture of T3 vertebra (HCC) 05/19/2023   Compression fracture of C7 vertebra (HCC) 05/19/2023   Osteoporosis 05/19/2023    PCP: Oneil PHEBE Pinal, MD  REFERRING PROVIDER: Oneil PHEBE Pinal, MD   REFERRING DIAG: S12.600A (ICD-10-CM) - Unspecified displaced fracture of seventh cervical vertebra, initial encounter for closed fracture  THERAPY DIAG:  Cervicalgia  Muscle weakness (generalized)  Stiffness of right shoulder, not elsewhere classified  Rationale for Evaluation and Treatment: Rehabilitation  ONSET DATE: July 2024  SUBJECTIVE:                                                                                                                                                                                                         SUBJECTIVE STATEMENT: 05/17/24: Patient with report of 0/10 NPS in the upper neck. Patient with continue stiffness in the R  neck. Patient reports that she had moderate soreness in her shoulders/arms following last PT session. No further questions or concerns.   PERTINENT HISTORY:  Pt presents to PT w/ chronic limited cervical mobility 2/2 severe osteoporosis, scleroderma, and hx of cervical fibrosis of the neck. Since previous bout of PT patient had a fall in the restroom resulting in three fx vertebrae, fx of six ribs, and a fx of her R shoulder blade. Pt's current fx are being managed non-surgically.  Pt reports no pain or sx of N/T. She states she has the most difficulty with maintaining a seated position with no back support and is only able to maintain this position for 2-3 minutes before requiring back support. Pt also reports an increased fear of falling.   PAIN:  Are you having pain? No Main aggravation cannot sit up without back support, increased fear of falling.  Easing factors: having a supported back 2-3 minutes of sitting unsupported before needing support  PRECAUTIONS: Fall  RED FLAGS: None     WEIGHT BEARING RESTRICTIONS: No  FALLS:  Has patient fallen in last 6 months? Yes. Number of falls 1 and near falls  LIVING ENVIRONMENT: Has following equipment at home: Walker - 2 wheeled *Pt does not use RW often  OCCUPATION: Retired, after scleroderma dx  PLOF: Independent  PATIENT GOALS: To get moving again   NEXT MD VISIT: N/A  OBJECTIVE:  Note: Objective measures were completed at Evaluation unless otherwise noted.  DIAGNOSTIC FINDINGS:  CLINICAL DATA:  The patient suffered cervical and thoracic spine compression fractures in a fall 03/25/2023. Subsequent encounter.   EXAM: MRI CERVICAL SPINE WITHOUT CONTRAST   TECHNIQUE: Multiplanar, multisequence MR imaging of the cervical spine was performed. No intravenous contrast was administered.   COMPARISON:  CT chest 04/16/2023.   FINDINGS: Alignment: No listhesis. Mild kyphosis is centered about the T1 level.   Vertebrae: The  patient has a superior endplate compression fracture of C7 with vertebral body height loss of approximately 30%, a biconcave compression fracture of T1 vertebral body height loss of approximately 50% and a biconcave compression fracture T3 with vertebral body height loss centrally of approximately 80%. There is marrow edema within each of the vertebral bodies consistent with acute or early subacute injuries. Marrow edema is also seen in the right fifth and sixth ribs due to the fractures seen on the prior CT.   Cord: Normal signal throughout the visualized cord.   Posterior Fossa, vertebral arteries, paraspinal tissues: Negative.   IMPRESSION: 1. Acute or early subacute compression fractures of C7, T1 and T3 as described above. No retropulsion off the superior endplates of C7 or T1. The central spinal canal and neural foramina are open at all levels. 2. Marrow edema in the right fifth and sixth ribs due to the fractures seen on the prior CT.  PATIENT SURVEYS:  FOTO 48/57  COGNITION: Overall cognitive status: Within functional limits for tasks assessed  SENSATION: WFL  POSTURE: rounded shoulders and forward head  PALPATION: No TTP noted    CERVICAL ROM:   Active ROM A/PROM (deg) eval  Flexion 45 deg  Extension 10 deg  Right lateral flexion 40 deg  Left lateral flexion 35 deg*  Right rotation 40 deg  Left rotation 25 deg*   (Blank rows = not tested)  All cervical PROM= AROM   UPPER EXTREMITY ROM:  Active ROM Right eval Left eval  Shoulder flexion    Shoulder extension    Shoulder abduction 100 deg   Shoulder adduction    Shoulder extension    Shoulder internal rotation C7 C7  Shoulder external rotation Mid- thoracic Mid- thoracic  Elbow flexion    Elbow extension    Wrist flexion    Wrist extension    Wrist ulnar deviation    Wrist radial deviation    Wrist pronation    Wrist supination     (Blank rows = not tested)  UPPER EXTREMITY MMT:  MMT  Right eval Left  eval  Shoulder flexion 4 4-  Shoulder extension    Shoulder abduction 4+ 4+  Shoulder adduction    Shoulder extension    Shoulder internal rotation 4 4  Shoulder external rotation 4 4  Middle trapezius    Lower trapezius    Elbow flexion 4+ 4+  Elbow extension 4+ 4+  Wrist flexion    Wrist extension    Wrist ulnar deviation    Wrist radial deviation    Wrist pronation    Wrist supination    Grip strength 4 4   (Blank rows = not tested)  CERVICAL SPECIAL TESTS:  Deferred 2/2 to limited cervical mobility  FUNCTIONAL TESTS:  DNF Endurance Test: Deferred to next session   SLS assessment RLE- 16 seconds LLE- 27 seconds w/ bilat UE support 07/08/23  TODAY'S TREATMENT: DATE: 05/17/24   Therapeutic Activity:  Standing AAROM - BUE Forward Flexion    2 x 10   Seated Arm Circles    3 x 10 - CW/CCW ea direction  - Wide Diameter for deltoid strengthening/endurance   Seated [employer] for upward reaching and strengthening  3 x 16 reps - Alternating Shoulder   Standing Shoulder Front Raise against resistance  1 x 10 - Green TB  1 x 10 - Red TB   OH reaching to various targets on wall (Dual Tasking - PT calls out target) - for UE strength and facilitation of cervical extension (scanning)  R/L: 2 x 10 - 4# DB    Sit to Stand with OH Press (Med [place])   1 x 8 - 3 Kg Med Ball    1 x 10 - 3 Kg Med [employer] Sit with Shoulder Abduction    R/L: 1  x 5 - 4# DB; 1 x 8 - 2# DB  PATIENT EDUCATION:  Education details: Education on Technique with exercises  Person educated: Patient Education method: Medical illustrator Education comprehension: verbalized understanding and returned demonstration  HOME EXERCISE PROGRAM: Access Code: [email or web address] URL: [email or web address] Date: 05/17/2024 Prepared by: Lonni Pall  Exercises - Supine Chin Tuck  - 1 x daily - 4-5 x weekly - 2 sets - 10 reps - Cervical Extension AROM with  Strap  - 1 x daily - 4-5 x weekly - 2 sets - 10 reps - Seated Levator Scapulae Stretch  - 1 x daily - 4-5 x weekly - 1 sets - 2 reps - 30 hold - Wall Quarter Squat  - 1 x daily - 3-4 x weekly - 2-3 sets - 15 hold - Standard Plank  - 1 x daily - 3-4 x weekly - 2-3 sets - 15 hold - Scapular Retraction with Resistance  - 1 x daily - 7 x weekly - 2-3 sets - 10-12 reps - Shoulder External Rotation and Scapular Retraction with Resistance  - 1 x daily - 7 x weekly - 2-3 sets - 10-12 reps - Arm Circles on Swiss Ball  - 1 x daily - 7 x weekly - 2-3 sets - 10 reps - Standing Single Arm Shoulder Abduction with Dumbbell - Palm Down  - 1 x daily - 7 x weekly - 2-3 sets - 8-10 reps  Access Code: [email or web address] URL: [email or web address] Date: 01/19/2024 Prepared by: Lonni Pall  Exercises - Supine Chin Tuck  - 1 x daily - 4-5 x weekly - 2 sets - 10 reps - Cervical Extension AROM with Strap  - 1 x daily - 4-5 x weekly -  2 sets - 10 reps - Seated Levator Scapulae Stretch  - 1 x daily - 4-5 x weekly - 1 sets - 2 reps - 30 hold - Seated scapular retraction   - 1 x daily - 4-5 x weekly - 2 sets - 10 reps - Standing Tandem Balance with Counter Support  - 1 x daily - 3-4 x weekly - 1 sets - 3 reps - 30 hold - Standing Single Leg Stance with Counter Support  - 1 x daily - 3-4 x weekly - 1 sets - 3 reps - 30 hold - Wall Quarter Squat  - 1 x daily - 3-4 x weekly - 2-3 sets - 15 hold - Standard Plank  - 1 x daily - 3-4 x weekly - 2-3 sets - 15 hold - Scapular Retraction with Resistance  - 1 x daily - 7 x weekly - 2-3 sets - 10-12 reps - Shoulder External Rotation and Scapular Retraction with Resistance  - 1 x daily - 7 x weekly - 2-3 sets - 10-12 reps  Access Code: [email or web address] URL: [email or web address] Date: 08/12/2023 Prepared by: Dorina Kingfisher  Exercises - Supine Chin Tuck  - 1 x daily - 4-5 x weekly - 2 sets - 10 reps - Cervical Extension AROM with Strap  - 1 x daily - 4-5 x weekly  - 2 sets - 10 reps - Seated Levator Scapulae Stretch  - 1 x daily - 4-5 x weekly - 1 sets - 2 reps - 30 hold - Seated scapular retraction   - 1 x daily - 4-5 x weekly - 2 sets - 10 reps - Standing Tandem Balance with Counter Support  - 1 x daily - 3-4 x weekly - 1 sets - 3 reps - 30 hold - Standing Single Leg Stance with Counter Support  - 1 x daily - 3-4 x weekly - 1 sets - 3 reps - 30 hold   Access Code: [email or web address] URL: [email or web address] Date: 06/24/2023 Prepared by: Sidra Simpers  Exercises - Supine Chin Tuck  - 1 x daily - 4-5 x weekly - 2 sets - 10 reps - Cervical Extension AROM with Strap  - 1 x daily - 4-5 x weekly - 2 sets - 10 reps - Seated Levator Scapulae Stretch  - 1 x daily - 4-5 x weekly - 1 sets - 2 reps - 30 hold - Seated scapular retraction   - 1 x daily - 4-5 x weekly - 2 sets - 10 reps  ASSESSMENT:  CLINICAL IMPRESSION: Continued PT POC with continued focus on addressing cervicothoracic and scapular mobility deficits. PT focused on functional activities strengthening her reaching capacity with resistance. She tolerated all interventions without pain in her shoulder or upper neck. Her cervical ROM continues to be significantly limited due to myofascial restrictions in the R upper neck. PT updated HEP to include light scapular strengthening exercise in order to maintain current function. Without PT interventions patient's functional status has potentional to regress leading to further injury and poor QoL. She will continue to benefit from skilled PT in order to maintain current function and decrease regression in QoL.   OBJECTIVE IMPAIRMENTS: decreased mobility, decreased ROM, decreased strength, hypomobility, and impaired flexibility.   ACTIVITY LIMITATIONS: carrying and lifting  PARTICIPATION LIMITATIONS: community activity  PERSONAL FACTORS: Age, Past/current experiences, Time since onset of injury/illness/exacerbation, and 3+ comorbidities:  scleroderma, osteoporosis, hx of cancer are also affecting patient's functional outcome.   REHAB POTENTIAL: Fair multi- comorbidities  CLINICAL DECISION MAKING: Evolving/moderate  complexity  EVALUATION COMPLEXITY: Moderate   GOALS: Goals reviewed with patient? Yes  SHORT TERM GOALS: Target date: 12/01/23  Pt will be independent with HEP to improve cervical and shoulder strength with functional activities   Baseline: 06/24/23: HEP given to pt; 09/02/23: 25% compliance; 11/03/23: 25% compliance; 05/03/2024: < 50% adherence (limited to just AROM) Goal status: ON GOING   LONG TERM GOALS: Target date: 06/28/2024  Pt will improve FOTO to target score to demonstrate clinically significant improvement in functional mobility.   Baseline: 06/24/23: 46/58; 09/02/23: 47/58; 11/03/23: 50/58 Goal status: DEFERRED  2.  Pt will improve all R/L shoulder MMT 4+ to improve functional mobility of bilat UE's.  Baseline: 06/24/23; 09/02/23; 11/03/23; MMT Right eval Left eval Left  09/02/23 Right 09/02/23 Right  11/03/23 Left 11/03/23 Right  04/06/24 Left  04/06/24 R/L 05/03/24  Shoulder flexion 4 4- 4- 4- 4 4+ 4- 4 4/5  Shoulder extension           Shoulder abduction 4+ 4+ 5 4+ 4+ 5 5 5  4-/4+  Shoulder adduction           Shoulder extension           Shoulder internal rotation 4 4 4+ 4+ 4 5 4 5  5/5  Shoulder external rotation 4 4 4+ 4+ 4 5 4 5  5/5           Goal status: PARTIALLY MET  3.  Pt will improve all cervical AROM to [facility] to improve functional mobility.  Baseline: 06/24/23:  Active ROM A/PROM (deg) eval A/PROM (Deg)  09/02/23 A/PROM (Deg)  11/03/23 A/PROM (Deg)  04/06/24 A/PROM  (Deg) 05/03/2024  Flexion 45 deg 45 45 35 35  Extension 10 deg 10 10 15  < 10  Right lateral flexion 40 deg 20 40 30 30  Left lateral flexion 35 deg* 35 40 20 20  Right rotation 40 deg 35 40 40 35  Left rotation 25 deg* 25 30 25 30    Goal status: PARTIALLY MET  PLAN:  PT FREQUENCY: 1x/week  PT  DURATION: 8 weeks (bi weekly schedule)   PLANNED INTERVENTIONS: Therapeutic exercises, Therapeutic activity, Neuromuscular re-education, Balance training, Gait training, Patient/Family education, Self Care, Joint mobilization, Spinal manipulation, Spinal mobilization, Cryotherapy, Moist heat, Manual therapy, and Re-evaluation  PLAN FOR NEXT SESSION: Cervical manual therapy/stretching. Progress postural strengthening exercises. Overhead head motions/strengthening. Compound movement patterns for cervicothoracic mobility and shoulder strength.  Lonni Pall PT, DPT Physical Therapist- [facility] 05/17/24, 1:10 PM

## 2024-05-18 ENCOUNTER — Encounter

## 2024-05-25 ENCOUNTER — Encounter

## 2024-05-31 ENCOUNTER — Ambulatory Visit: Attending: Internal Medicine

## 2024-05-31 DIAGNOSIS — M25611 Stiffness of right shoulder, not elsewhere classified: Secondary | ICD-10-CM | POA: Insufficient documentation

## 2024-05-31 DIAGNOSIS — M79641 Pain in right hand: Secondary | ICD-10-CM | POA: Insufficient documentation

## 2024-05-31 DIAGNOSIS — M6281 Muscle weakness (generalized): Secondary | ICD-10-CM | POA: Insufficient documentation

## 2024-05-31 DIAGNOSIS — M542 Cervicalgia: Secondary | ICD-10-CM | POA: Insufficient documentation

## 2024-05-31 NOTE — Therapy (Signed)
 OUTPATIENT PHYSICAL THERAPY CERVICAL TREATMENT   Patient Name: Whitney Mclean MRN: [id number] DOB:02/21/1966, 58 y.o., female Today's Date: 05/31/2024  END OF SESSION:  PT End of Session - 05/31/24 1307     Visit Number 32    Number of Visits 50    Date for PT Re-Evaluation 06/28/24    Authorization Type 2025 - Calendar VL 30    Authorization Time Period 09/24/23-09/22/24    Authorization - Visit Number 21    Authorization - Number of Visits 30    Progress Note Due on Visit 40    PT Start Time 1305    PT Stop Time 1345    PT Time Calculation (min) 40 min    Activity Tolerance Patient tolerated treatment well    Behavior During Therapy WFL for tasks assessed/performed             Past Medical History:  Diagnosis Date   Oral cancer (HCC)    Personal history of chemotherapy    Personal history of radiation therapy    Scleroderma (HCC)    Past Surgical History:  Procedure Laterality Date   ABDOMINAL HYSTERECTOMY     Patient Active Problem List   Diagnosis Date Noted   Other secondary kyphosis, cervicothoracic region 05/19/2023   Compression fracture of T3 vertebra (HCC) 05/19/2023   Compression fracture of C7 vertebra (HCC) 05/19/2023   Osteoporosis 05/19/2023    PCP: Oneil PHEBE Pinal, MD  REFERRING PROVIDER: Oneil PHEBE Pinal, MD   REFERRING DIAG: S12.600A (ICD-10-CM) - Unspecified displaced fracture of seventh cervical vertebra, initial encounter for closed fracture  THERAPY DIAG:  Cervicalgia  Muscle weakness (generalized)  Stiffness of right shoulder, not elsewhere classified  Pain in right hand  Rationale for Evaluation and Treatment: Rehabilitation  ONSET DATE: July 2024  SUBJECTIVE:                                                                                                                                                                                                         SUBJECTIVE STATEMENT: 05/31/24: Patient without pain in her neck.  She has been adherent to HEP and utilizing light dumbbells. No questions or concerns.   PERTINENT HISTORY:  Pt presents to PT w/ chronic limited cervical mobility 2/2 severe osteoporosis, scleroderma, and hx of cervical fibrosis of the neck. Since previous bout of PT patient had a fall in the restroom resulting in three fx vertebrae, fx of six ribs, and a fx of her R shoulder blade. Pt's current fx are being managed non-surgically. Pt reports no pain  or sx of N/T. She states she has the most difficulty with maintaining a seated position with no back support and is only able to maintain this position for 2-3 minutes before requiring back support. Pt also reports an increased fear of falling.   PAIN:  Are you having pain? No Main aggravation cannot sit up without back support, increased fear of falling.  Easing factors: having a supported back 2-3 minutes of sitting unsupported before needing support  PRECAUTIONS: Fall  RED FLAGS: None     WEIGHT BEARING RESTRICTIONS: No  FALLS:  Has patient fallen in last 6 months? Yes. Number of falls 1 and near falls  LIVING ENVIRONMENT: Has following equipment at home: Walker - 2 wheeled *Pt does not use RW often  OCCUPATION: Retired, after scleroderma dx  PLOF: Independent  PATIENT GOALS: To get moving again   NEXT MD VISIT: N/A  OBJECTIVE:  Note: Objective measures were completed at Evaluation unless otherwise noted.  DIAGNOSTIC FINDINGS:  CLINICAL DATA:  The patient suffered cervical and thoracic spine compression fractures in a fall 03/25/2023. Subsequent encounter.   EXAM: MRI CERVICAL SPINE WITHOUT CONTRAST   TECHNIQUE: Multiplanar, multisequence MR imaging of the cervical spine was performed. No intravenous contrast was administered.   COMPARISON:  CT chest 04/16/2023.   FINDINGS: Alignment: No listhesis. Mild kyphosis is centered about the T1 level.   Vertebrae: The patient has a superior endplate compression  fracture of C7 with vertebral body height loss of approximately 30%, a biconcave compression fracture of T1 vertebral body height loss of approximately 50% and a biconcave compression fracture T3 with vertebral body height loss centrally of approximately 80%. There is marrow edema within each of the vertebral bodies consistent with acute or early subacute injuries. Marrow edema is also seen in the right fifth and sixth ribs due to the fractures seen on the prior CT.   Cord: Normal signal throughout the visualized cord.   Posterior Fossa, vertebral arteries, paraspinal tissues: Negative.   IMPRESSION: 1. Acute or early subacute compression fractures of C7, T1 and T3 as described above. No retropulsion off the superior endplates of C7 or T1. The central spinal canal and neural foramina are open at all levels. 2. Marrow edema in the right fifth and sixth ribs due to the fractures seen on the prior CT.  PATIENT SURVEYS:  FOTO 48/57  COGNITION: Overall cognitive status: Within functional limits for tasks assessed  SENSATION: WFL  POSTURE: rounded shoulders and forward head  PALPATION: No TTP noted    CERVICAL ROM:   Active ROM A/PROM (deg) eval  Flexion 45 deg  Extension 10 deg  Right lateral flexion 40 deg  Left lateral flexion 35 deg*  Right rotation 40 deg  Left rotation 25 deg*   (Blank rows = not tested)  All cervical PROM= AROM   UPPER EXTREMITY ROM:  Active ROM Right eval Left eval  Shoulder flexion    Shoulder extension    Shoulder abduction 100 deg   Shoulder adduction    Shoulder extension    Shoulder internal rotation C7 C7  Shoulder external rotation Mid- thoracic Mid- thoracic  Elbow flexion    Elbow extension    Wrist flexion    Wrist extension    Wrist ulnar deviation    Wrist radial deviation    Wrist pronation    Wrist supination     (Blank rows = not tested)  UPPER EXTREMITY MMT:  MMT Right eval Left eval  Shoulder flexion 4  4-   Shoulder extension    Shoulder abduction 4+ 4+  Shoulder adduction    Shoulder extension    Shoulder internal rotation 4 4  Shoulder external rotation 4 4  Middle trapezius    Lower trapezius    Elbow flexion 4+ 4+  Elbow extension 4+ 4+  Wrist flexion    Wrist extension    Wrist ulnar deviation    Wrist radial deviation    Wrist pronation    Wrist supination    Grip strength 4 4   (Blank rows = not tested)  CERVICAL SPECIAL TESTS:  Deferred 2/2 to limited cervical mobility  FUNCTIONAL TESTS:  DNF Endurance Test: Deferred to next session   SLS assessment RLE- 16 seconds LLE- 27 seconds w/ bilat UE support 07/08/23  TODAY'S TREATMENT: DATE: 05/31/24   Therapeutic Activity:  UBE x 5 min (2 fwd/retro dir) x level 12-8 for UE muscle strength and endurance; PT manually adjusted resistance to patient tolerance.   Standing AAROM with Physioball  1 x 10  2 x 10 - 2# AW donned on wrist   Wall Sit with Shoulder Abduction for core strengthening, LE Strength, shoulder strength  2 x 10 - 2# AW donned on wrist   Seated Barbell (25#) [employer] for upward reaching and strengthening            2 x 10 reps - Alternating Shoulder    Manual Therapy:   10 minutes total in supine for passive L cervical rotation, L lateral flexion stretches.  Light STM applied to R upper neck musculature in order to increase cervical flexibility and decrease muscle tension. Myofascial release technique utilized. Minimal improvements with L cervical rotation.     PATIENT EDUCATION:  Education details: Education on Technique with exercises  Person educated: Patient Education method: Medical illustrator Education comprehension: verbalized understanding and returned demonstration  HOME EXERCISE PROGRAM: Access Code: [email or web address] URL: [email or web address] Date: 05/17/2024 Prepared by: Lonni Pall  Exercises - Supine Chin Tuck  - 1 x daily - 4-5 x weekly - 2 sets - 10  reps - Cervical Extension AROM with Strap  - 1 x daily - 4-5 x weekly - 2 sets - 10 reps - Seated Levator Scapulae Stretch  - 1 x daily - 4-5 x weekly - 1 sets - 2 reps - 30 hold - Wall Quarter Squat  - 1 x daily - 3-4 x weekly - 2-3 sets - 15 hold - Standard Plank  - 1 x daily - 3-4 x weekly - 2-3 sets - 15 hold - Scapular Retraction with Resistance  - 1 x daily - 7 x weekly - 2-3 sets - 10-12 reps - Shoulder External Rotation and Scapular Retraction with Resistance  - 1 x daily - 7 x weekly - 2-3 sets - 10-12 reps - Arm Circles on Swiss Ball  - 1 x daily - 7 x weekly - 2-3 sets - 10 reps - Standing Single Arm Shoulder Abduction with Dumbbell - Palm Down  - 1 x daily - 7 x weekly - 2-3 sets - 8-10 reps  Access Code: [email or web address] URL: [email or web address] Date: 01/19/2024 Prepared by: Lonni Pall  Exercises - Supine Chin Tuck  - 1 x daily - 4-5 x weekly - 2 sets - 10 reps - Cervical Extension AROM with Strap  - 1 x daily - 4-5 x weekly - 2 sets - 10 reps - Seated Levator Scapulae Stretch  - 1 x daily - 4-5 x weekly -  1 sets - 2 reps - 30 hold - Seated scapular retraction   - 1 x daily - 4-5 x weekly - 2 sets - 10 reps - Standing Tandem Balance with Counter Support  - 1 x daily - 3-4 x weekly - 1 sets - 3 reps - 30 hold - Standing Single Leg Stance with Counter Support  - 1 x daily - 3-4 x weekly - 1 sets - 3 reps - 30 hold - Wall Quarter Squat  - 1 x daily - 3-4 x weekly - 2-3 sets - 15 hold - Standard Plank  - 1 x daily - 3-4 x weekly - 2-3 sets - 15 hold - Scapular Retraction with Resistance  - 1 x daily - 7 x weekly - 2-3 sets - 10-12 reps - Shoulder External Rotation and Scapular Retraction with Resistance  - 1 x daily - 7 x weekly - 2-3 sets - 10-12 reps  Access Code: [email or web address] URL: [email or web address] Date: 08/12/2023 Prepared by: Dorina Kingfisher  Exercises - Supine Chin Tuck  - 1 x daily - 4-5 x weekly - 2 sets - 10 reps - Cervical Extension AROM  with Strap  - 1 x daily - 4-5 x weekly - 2 sets - 10 reps - Seated Levator Scapulae Stretch  - 1 x daily - 4-5 x weekly - 1 sets - 2 reps - 30 hold - Seated scapular retraction   - 1 x daily - 4-5 x weekly - 2 sets - 10 reps - Standing Tandem Balance with Counter Support  - 1 x daily - 3-4 x weekly - 1 sets - 3 reps - 30 hold - Standing Single Leg Stance with Counter Support  - 1 x daily - 3-4 x weekly - 1 sets - 3 reps - 30 hold   Access Code: [email or web address] URL: [email or web address] Date: 06/24/2023 Prepared by: Sidra Simpers  Exercises - Supine Chin Tuck  - 1 x daily - 4-5 x weekly - 2 sets - 10 reps - Cervical Extension AROM with Strap  - 1 x daily - 4-5 x weekly - 2 sets - 10 reps - Seated Levator Scapulae Stretch  - 1 x daily - 4-5 x weekly - 1 sets - 2 reps - 30 hold - Seated scapular retraction   - 1 x daily - 4-5 x weekly - 2 sets - 10 reps  ASSESSMENT:  CLINICAL IMPRESSION: Continued PT POC with continued focus on addressing cervicothoracic and scapular mobility deficits. Focused on total body strengthening and preserving cervical ROM/UE strength. She still continues to present with significant limitations in cervical ROM secondary to myofascial restrictions/chronic conditions.  Without PT interventions patient's functional status has potentional to regress leading to further injury and poor QoL. She will continue to benefit from skilled PT in order to maintain current function and decrease regression in QoL.  OBJECTIVE IMPAIRMENTS: decreased mobility, decreased ROM, decreased strength, hypomobility, and impaired flexibility.   ACTIVITY LIMITATIONS: carrying and lifting  PARTICIPATION LIMITATIONS: community activity  PERSONAL FACTORS: Age, Past/current experiences, Time since onset of injury/illness/exacerbation, and 3+ comorbidities: scleroderma, osteoporosis, hx of cancer are also affecting patient's functional outcome.   REHAB POTENTIAL: Fair multi-  comorbidities  CLINICAL DECISION MAKING: Evolving/moderate complexity  EVALUATION COMPLEXITY: Moderate   GOALS: Goals reviewed with patient? Yes  SHORT TERM GOALS: Target date: 12/01/23  Pt will be independent with HEP to improve cervical and shoulder strength with functional activities   Baseline: 06/24/23: HEP given to pt; 09/02/23: 25% compliance; 11/03/23: 25%  compliance; 05/03/2024: < 50% adherence (limited to just AROM) Goal status: ON GOING   LONG TERM GOALS: Target date: 06/28/2024  Pt will improve FOTO to target score to demonstrate clinically significant improvement in functional mobility.   Baseline: 06/24/23: 46/58; 09/02/23: 47/58; 11/03/23: 50/58 Goal status: DEFERRED  2.  Pt will improve all R/L shoulder MMT 4+ to improve functional mobility of bilat UE's.  Baseline: 06/24/23; 09/02/23; 11/03/23; MMT Right eval Left eval Left  09/02/23 Right 09/02/23 Right  11/03/23 Left 11/03/23 Right  04/06/24 Left  04/06/24 R/L 05/03/24  Shoulder flexion 4 4- 4- 4- 4 4+ 4- 4 4/5  Shoulder extension           Shoulder abduction 4+ 4+ 5 4+ 4+ 5 5 5  4-/4+  Shoulder adduction           Shoulder extension           Shoulder internal rotation 4 4 4+ 4+ 4 5 4 5  5/5  Shoulder external rotation 4 4 4+ 4+ 4 5 4 5  5/5           Goal status: PARTIALLY MET  3.  Pt will improve all cervical AROM to [facility] to improve functional mobility.  Baseline: 06/24/23:  Active ROM A/PROM (deg) eval A/PROM (Deg)  09/02/23 A/PROM (Deg)  11/03/23 A/PROM (Deg)  04/06/24 A/PROM  (Deg) 05/03/2024  Flexion 45 deg 45 45 35 35  Extension 10 deg 10 10 15  < 10  Right lateral flexion 40 deg 20 40 30 30  Left lateral flexion 35 deg* 35 40 20 20  Right rotation 40 deg 35 40 40 35  Left rotation 25 deg* 25 30 25 30    Goal status: PARTIALLY MET  PLAN:  PT FREQUENCY: 1x/week  PT DURATION: 8 weeks (bi weekly schedule)   PLANNED INTERVENTIONS: Therapeutic exercises, Therapeutic activity,  Neuromuscular re-education, Balance training, Gait training, Patient/Family education, Self Care, Joint mobilization, Spinal manipulation, Spinal mobilization, Cryotherapy, Moist heat, Manual therapy, and Re-evaluation  PLAN FOR NEXT SESSION: Cervical manual therapy/stretching. Progress postural strengthening exercises. Overhead head motions/strengthening. Compound movement patterns for cervicothoracic mobility and shoulder strength.  Lonni Pall PT, DPT Physical Therapist- [facility] 05/31/24, 1:52 PM

## 2024-06-02 ENCOUNTER — Encounter

## 2024-06-08 ENCOUNTER — Encounter

## 2024-06-14 ENCOUNTER — Encounter

## 2024-06-15 ENCOUNTER — Encounter

## 2024-06-15 NOTE — Therapy (Signed)
 OUTPATIENT PHYSICAL THERAPY CERVICAL TREATMENT   Patient Name: Whitney Mclean MRN: [id number] DOB:[birth year], 58 y.o., female Today's Date: 06/16/2024  END OF SESSION:  PT End of Session - 06/16/24 1517     Visit Number 33    Number of Visits 50    Date for Recertification  06/28/24    Authorization Type 2025 - Calendar VL 30    Authorization Time Period 09/24/23-09/22/24    Authorization - Number of Visits 30    Progress Note Due on Visit 40    PT Start Time 1516    PT Stop Time 1557    PT Time Calculation (min) 41 min    Activity Tolerance Patient tolerated treatment well    Behavior During Therapy WFL for tasks assessed/performed              Past Medical History:  Diagnosis Date   Oral cancer (HCC)    Personal history of chemotherapy    Personal history of radiation therapy    Scleroderma (HCC)    Past Surgical History:  Procedure Laterality Date   ABDOMINAL HYSTERECTOMY     Patient Active Problem List   Diagnosis Date Noted   Other secondary kyphosis, cervicothoracic region 05/19/2023   Compression fracture of T3 vertebra (HCC) 05/19/2023   Compression fracture of C7 vertebra (HCC) 05/19/2023   Osteoporosis 05/19/2023    PCP: Oneil PHEBE Pinal, MD  REFERRING PROVIDER: Oneil PHEBE Pinal, MD   REFERRING DIAG: S12.600A (ICD-10-CM) - Unspecified displaced fracture of seventh cervical vertebra, initial encounter for closed fracture  THERAPY DIAG:  Cervicalgia  Muscle weakness (generalized)  Stiffness of right shoulder, not elsewhere classified  Pain in right hand  Rationale for Evaluation and Treatment: Rehabilitation  ONSET DATE: July 2024  SUBJECTIVE:                                                                                                                                                                                                         SUBJECTIVE STATEMENT: 05/31/24: Patient without pain in her neck. She has been adherent to HEP and  utilizing light dumbbells. No questions or concerns.   PERTINENT HISTORY:  Pt presents to PT w/ chronic limited cervical mobility 2/2 severe osteoporosis, scleroderma, and hx of cervical fibrosis of the neck. Since previous bout of PT patient had a fall in the restroom resulting in three fx vertebrae, fx of six ribs, and a fx of her R shoulder blade. Pt's current fx are being managed non-surgically. Pt reports no pain or sx of N/T. She states she  has the most difficulty with maintaining a seated position with no back support and is only able to maintain this position for 2-3 minutes before requiring back support. Pt also reports an increased fear of falling.   PAIN:  Are you having pain? No Main aggravation cannot sit up without back support, increased fear of falling.  Easing factors: having a supported back 2-3 minutes of sitting unsupported before needing support  PRECAUTIONS: Fall  RED FLAGS: None     WEIGHT BEARING RESTRICTIONS: No  FALLS:  Has patient fallen in last 6 months? Yes. Number of falls 1 and near falls  LIVING ENVIRONMENT: Has following equipment at home: [employer] - 2 wheeled *Pt does not use RW often  OCCUPATION: Retired, after scleroderma dx  PLOF: Independent  PATIENT GOALS: To get moving again   NEXT MD VISIT: N/A  OBJECTIVE:  Note: Objective measures were completed at Evaluation unless otherwise noted.  DIAGNOSTIC FINDINGS:  CLINICAL DATA:  The patient suffered cervical and thoracic spine compression fractures in a fall 03/25/2023. Subsequent encounter.   EXAM: MRI CERVICAL SPINE WITHOUT CONTRAST   TECHNIQUE: Multiplanar, multisequence MR imaging of the cervical spine was performed. No intravenous contrast was administered.   COMPARISON:  CT chest 04/16/2023.   FINDINGS: Alignment: No listhesis. Mild kyphosis is centered about the T1 level.   Vertebrae: The patient has a superior endplate compression fracture of C7 with vertebral body height  loss of approximately 30%, a biconcave compression fracture of T1 vertebral body height loss of approximately 50% and a biconcave compression fracture T3 with vertebral body height loss centrally of approximately 80%. There is marrow edema within each of the vertebral bodies consistent with acute or early subacute injuries. Marrow edema is also seen in the right fifth and sixth ribs due to the fractures seen on the prior CT.   Cord: Normal signal throughout the visualized cord.   Posterior Fossa, vertebral arteries, paraspinal tissues: Negative.   IMPRESSION: 1. Acute or early subacute compression fractures of C7, T1 and T3 as described above. No retropulsion off the superior endplates of C7 or T1. The central spinal canal and neural foramina are open at all levels. 2. Marrow edema in the right fifth and sixth ribs due to the fractures seen on the prior CT.  PATIENT SURVEYS:  FOTO 48/57  COGNITION: Overall cognitive status: Within functional limits for tasks assessed  SENSATION: WFL  POSTURE: rounded shoulders and forward head  PALPATION: No TTP noted    CERVICAL ROM:   Active ROM A/PROM (deg) eval  Flexion 45 deg  Extension 10 deg  Right lateral flexion 40 deg  Left lateral flexion 35 deg*  Right rotation 40 deg  Left rotation 25 deg*   (Blank rows = not tested)  All cervical PROM= AROM   UPPER EXTREMITY ROM:  Active ROM Right eval Left eval  Shoulder flexion    Shoulder extension    Shoulder abduction 100 deg   Shoulder adduction    Shoulder extension    Shoulder internal rotation C7 C7  Shoulder external rotation Mid- thoracic Mid- thoracic  Elbow flexion    Elbow extension    Wrist flexion    Wrist extension    Wrist ulnar deviation    Wrist radial deviation    Wrist pronation    Wrist supination     (Blank rows = not tested)  UPPER EXTREMITY MMT:  MMT Right eval Left eval  Shoulder flexion 4 4-  Shoulder extension  Shoulder  abduction 4+ 4+  Shoulder adduction    Shoulder extension    Shoulder internal rotation 4 4  Shoulder external rotation 4 4  Middle trapezius    Lower trapezius    Elbow flexion 4+ 4+  Elbow extension 4+ 4+  Wrist flexion    Wrist extension    Wrist ulnar deviation    Wrist radial deviation    Wrist pronation    Wrist supination    Grip strength 4 4   (Blank rows = not tested)  CERVICAL SPECIAL TESTS:  Deferred 2/2 to limited cervical mobility  FUNCTIONAL TESTS:  DNF Endurance Test: Deferred to next session   SLS assessment RLE- 16 seconds LLE- 27 seconds w/ bilat UE support 07/08/23  TODAY'S TREATMENT: DATE: 06/16/24   Subjective: Patient reports no pain in her neck this date.   Therapeutic Activity:  UBE x 5 min (2 fwd/retro dir) x level 12-8 for UE muscle strength and endurance; PT manually adjusted resistance to patient tolerance.    Seated row with GTB    3 x 10   Seated B shoulder abduction    3 x 10 - 2# DB     Seated B shoulder flexion    3 x 10 - 2 #DB   Wall Sit with Shoulder Abduction for core strengthening, LE Strength, shoulder strength  3 x 10 second hold - 2# DB donned on wrist    Manual Therapy:   10 minutes total in supine for passive L cervical rotation, L lateral flexion stretches.  Light STM applied to R upper neck musculature in order to increase cervical flexibility and decrease muscle tension. Myofascial release technique utilized. Minimal improvements with L cervical rotation.     PATIENT EDUCATION:  Education details: Education on Technique with exercises  Person educated: Patient Education method: Medical illustrator Education comprehension: verbalized understanding and returned demonstration  HOME EXERCISE PROGRAM: Access Code: [email or web address] URL: [email or web address] Date: 05/17/2024 Prepared by: Lonni Pall  Exercises - Supine Chin Tuck  - 1 x daily - 4-5 x weekly - 2 sets - 10 reps - Cervical  Extension AROM with Strap  - 1 x daily - 4-5 x weekly - 2 sets - 10 reps - Seated Levator Scapulae Stretch  - 1 x daily - 4-5 x weekly - 1 sets - 2 reps - 30 hold - Wall Quarter Squat  - 1 x daily - 3-4 x weekly - 2-3 sets - 15 hold - Standard Plank  - 1 x daily - 3-4 x weekly - 2-3 sets - 15 hold - Scapular Retraction with Resistance  - 1 x daily - 7 x weekly - 2-3 sets - 10-12 reps - Shoulder External Rotation and Scapular Retraction with Resistance  - 1 x daily - 7 x weekly - 2-3 sets - 10-12 reps - Arm Circles on Swiss Ball  - 1 x daily - 7 x weekly - 2-3 sets - 10 reps - Standing Single Arm Shoulder Abduction with Dumbbell - Palm Down  - 1 x daily - 7 x weekly - 2-3 sets - 8-10 reps  Access Code: [email or web address] URL: [email or web address] Date: 01/19/2024 Prepared by: Lonni Pall  Exercises - Supine Chin Tuck  - 1 x daily - 4-5 x weekly - 2 sets - 10 reps - Cervical Extension AROM with Strap  - 1 x daily - 4-5 x weekly - 2 sets - 10 reps - Seated Levator Scapulae Stretch  - 1 x daily - 4-5 x  weekly - 1 sets - 2 reps - 30 hold - Seated scapular retraction   - 1 x daily - 4-5 x weekly - 2 sets - 10 reps - Standing Tandem Balance with Counter Support  - 1 x daily - 3-4 x weekly - 1 sets - 3 reps - 30 hold - Standing Single Leg Stance with Counter Support  - 1 x daily - 3-4 x weekly - 1 sets - 3 reps - 30 hold - Wall Quarter Squat  - 1 x daily - 3-4 x weekly - 2-3 sets - 15 hold - Standard Plank  - 1 x daily - 3-4 x weekly - 2-3 sets - 15 hold - Scapular Retraction with Resistance  - 1 x daily - 7 x weekly - 2-3 sets - 10-12 reps - Shoulder External Rotation and Scapular Retraction with Resistance  - 1 x daily - 7 x weekly - 2-3 sets - 10-12 reps  Access Code: [email or web address] URL: [email or web address] Date: 08/12/2023 Prepared by: Dorina Kingfisher  Exercises - Supine Chin Tuck  - 1 x daily - 4-5 x weekly - 2 sets - 10 reps - Cervical Extension AROM with Strap  - 1 x  daily - 4-5 x weekly - 2 sets - 10 reps - Seated Levator Scapulae Stretch  - 1 x daily - 4-5 x weekly - 1 sets - 2 reps - 30 hold - Seated scapular retraction   - 1 x daily - 4-5 x weekly - 2 sets - 10 reps - Standing Tandem Balance with Counter Support  - 1 x daily - 3-4 x weekly - 1 sets - 3 reps - 30 hold - Standing Single Leg Stance with Counter Support  - 1 x daily - 3-4 x weekly - 1 sets - 3 reps - 30 hold   Access Code: [email or web address] URL: [email or web address] Date: 06/24/2023 Prepared by: Sidra Simpers  Exercises - Supine Chin Tuck  - 1 x daily - 4-5 x weekly - 2 sets - 10 reps - Cervical Extension AROM with Strap  - 1 x daily - 4-5 x weekly - 2 sets - 10 reps - Seated Levator Scapulae Stretch  - 1 x daily - 4-5 x weekly - 1 sets - 2 reps - 30 hold - Seated scapular retraction   - 1 x daily - 4-5 x weekly - 2 sets - 10 reps  ASSESSMENT:  CLINICAL IMPRESSION:   Continued PT POC with continued focus on addressing cervicothoracic and scapular mobility deficits. Focused on total body strengthening and preserving cervical ROM/UE strength. She still continues to present with significant limitations in cervical ROM secondary to myofascial restrictions/chronic conditions.  Without PT interventions patient's functional status has potentional to regress leading to further injury and poor QoL. She will continue to benefit from skilled PT in order to maintain current function and decrease regression in QoL.  OBJECTIVE IMPAIRMENTS: decreased mobility, decreased ROM, decreased strength, hypomobility, and impaired flexibility.   ACTIVITY LIMITATIONS: carrying and lifting  PARTICIPATION LIMITATIONS: community activity  PERSONAL FACTORS: Age, Past/current experiences, Time since onset of injury/illness/exacerbation, and 3+ comorbidities: scleroderma, osteoporosis, hx of cancer are also affecting patient's functional outcome.   REHAB POTENTIAL: Fair multi- comorbidities  CLINICAL  DECISION MAKING: Evolving/moderate complexity  EVALUATION COMPLEXITY: Moderate   GOALS: Goals reviewed with patient? Yes  SHORT TERM GOALS: Target date: 12/01/23  Pt will be independent with HEP to improve cervical and shoulder strength with functional activities   Baseline: 06/24/23: HEP given to pt; 09/02/23:  25% compliance; 11/03/23: 25% compliance; 05/03/2024: < 50% adherence (limited to just AROM) Goal status: ON GOING   LONG TERM GOALS: Target date: 06/28/2024  Pt will improve FOTO to target score to demonstrate clinically significant improvement in functional mobility.   Baseline: 06/24/23: 46/58; 09/02/23: 47/58; 11/03/23: 50/58 Goal status: DEFERRED  2.  Pt will improve all R/L shoulder MMT 4+ to improve functional mobility of bilat UE's.  Baseline: 06/24/23; 09/02/23; 11/03/23; MMT Right eval Left eval Left  09/02/23 Right 09/02/23 Right  11/03/23 Left 11/03/23 Right  04/06/24 Left  04/06/24 R/L 05/03/24  Shoulder flexion 4 4- 4- 4- 4 4+ 4- 4 4/5  Shoulder extension           Shoulder abduction 4+ 4+ 5 4+ 4+ 5 5 5  4-/4+  Shoulder adduction           Shoulder extension           Shoulder internal rotation 4 4 4+ 4+ 4 5 4 5  5/5  Shoulder external rotation 4 4 4+ 4+ 4 5 4 5  5/5           Goal status: PARTIALLY MET  3.  Pt will improve all cervical AROM to [facility] to improve functional mobility.  Baseline: 06/24/23:  Active ROM A/PROM (deg) eval A/PROM (Deg)  09/02/23 A/PROM (Deg)  11/03/23 A/PROM (Deg)  04/06/24 A/PROM  (Deg) 05/03/2024  Flexion 45 deg 45 45 35 35  Extension 10 deg 10 10 15  < 10  Right lateral flexion 40 deg 20 40 30 30  Left lateral flexion 35 deg* 35 40 20 20  Right rotation 40 deg 35 40 40 35  Left rotation 25 deg* 25 30 25 30    Goal status: PARTIALLY MET  PLAN:  PT FREQUENCY: 1x/week  PT DURATION: 8 weeks (bi weekly schedule)   PLANNED INTERVENTIONS: Therapeutic exercises, Therapeutic activity, Neuromuscular re-education, Balance  training, Gait training, Patient/Family education, Self Care, Joint mobilization, Spinal manipulation, Spinal mobilization, Cryotherapy, Moist heat, Manual therapy, and Re-evaluation  PLAN FOR NEXT SESSION: Cervical manual therapy/stretching. Progress postural strengthening exercises. Overhead head motions/strengthening. Compound movement patterns for cervicothoracic mobility and shoulder strength.  Maryanne Finder, PT, DPT Physical Therapist - [facility] 06/16/24, 3:18 PM

## 2024-06-16 ENCOUNTER — Ambulatory Visit

## 2024-06-16 DIAGNOSIS — M25611 Stiffness of right shoulder, not elsewhere classified: Secondary | ICD-10-CM

## 2024-06-16 DIAGNOSIS — M542 Cervicalgia: Secondary | ICD-10-CM | POA: Diagnosis not present

## 2024-06-16 DIAGNOSIS — M79641 Pain in right hand: Secondary | ICD-10-CM

## 2024-06-16 DIAGNOSIS — M6281 Muscle weakness (generalized): Secondary | ICD-10-CM

## 2024-06-18 ENCOUNTER — Other Ambulatory Visit: Payer: Self-pay | Admitting: Internal Medicine

## 2024-06-18 DIAGNOSIS — M349 Systemic sclerosis, unspecified: Secondary | ICD-10-CM

## 2024-06-18 DIAGNOSIS — C770 Secondary and unspecified malignant neoplasm of lymph nodes of head, face and neck: Secondary | ICD-10-CM

## 2024-06-18 DIAGNOSIS — I6523 Occlusion and stenosis of bilateral carotid arteries: Secondary | ICD-10-CM

## 2024-06-21 ENCOUNTER — Encounter

## 2024-06-22 ENCOUNTER — Encounter

## 2024-06-23 ENCOUNTER — Ambulatory Visit
Admission: RE | Admit: 2024-06-23 | Discharge: 2024-06-23 | Disposition: A | Discharge status: Home / Self Care | Source: Ambulatory Visit | Attending: Internal Medicine | Admitting: Internal Medicine

## 2024-06-23 DIAGNOSIS — M349 Systemic sclerosis, unspecified: Secondary | ICD-10-CM | POA: Insufficient documentation

## 2024-06-23 DIAGNOSIS — C770 Secondary and unspecified malignant neoplasm of lymph nodes of head, face and neck: Secondary | ICD-10-CM | POA: Insufficient documentation

## 2024-06-23 DIAGNOSIS — I6523 Occlusion and stenosis of bilateral carotid arteries: Secondary | ICD-10-CM | POA: Insufficient documentation

## 2024-06-23 HISTORY — DX: Essential (primary) hypertension: I10

## 2024-06-23 MED ORDER — IOHEXOL 350 MG/ML SOLN
75.0000 mL | Freq: Once | INTRAVENOUS | Status: AC | PRN
  Administered 2024-06-23: 75 mL via INTRAVENOUS

## 2024-06-28 ENCOUNTER — Encounter

## 2024-06-29 ENCOUNTER — Encounter

## 2024-06-29 NOTE — Therapy (Signed)
 OUTPATIENT PHYSICAL THERAPY CERVICAL TREATMENT   Patient Name: Whitney Mclean MRN: [id number] DOB:[birth year], 58 y.o., female Today's Date: 06/29/2024  END OF SESSION:        Past Medical History:  Diagnosis Date   Hypertension    Oral cancer (HCC)    Personal history of chemotherapy    Personal history of radiation therapy    Scleroderma (HCC)    Past Surgical History:  Procedure Laterality Date   ABDOMINAL HYSTERECTOMY     Patient Active Problem List   Diagnosis Date Noted   Other secondary kyphosis, cervicothoracic region 05/19/2023   Compression fracture of T3 vertebra (HCC) 05/19/2023   Compression fracture of C7 vertebra (HCC) 05/19/2023   Osteoporosis 05/19/2023    PCP: Oneil PHEBE Pinal, MD  REFERRING PROVIDER: Oneil PHEBE Pinal, MD   REFERRING DIAG: S12.600A (ICD-10-CM) - Unspecified displaced fracture of seventh cervical vertebra, initial encounter for closed fracture  THERAPY DIAG:  Cervicalgia  Muscle weakness (generalized)  Rationale for Evaluation and Treatment: Rehabilitation  ONSET DATE: July 2024  SUBJECTIVE:                                                                                                                                                                                                         SUBJECTIVE STATEMENT: 05/31/24: Patient without pain in her neck. She has been adherent to HEP and utilizing light dumbbells. No questions or concerns.   PERTINENT HISTORY:  Pt presents to PT w/ chronic limited cervical mobility 2/2 severe osteoporosis, scleroderma, and hx of cervical fibrosis of the neck. Since previous bout of PT patient had a fall in the restroom resulting in three fx vertebrae, fx of six ribs, and a fx of her R shoulder blade. Pt's current fx are being managed non-surgically. Pt reports no pain or sx of N/T. She states she has the most difficulty with maintaining a seated position with no back support and is only able to  maintain this position for 2-3 minutes before requiring back support. Pt also reports an increased fear of falling.   PAIN:  Are you having pain? No Main aggravation cannot sit up without back support, increased fear of falling.  Easing factors: having a supported back 2-3 minutes of sitting unsupported before needing support  PRECAUTIONS: Fall  RED FLAGS: None     WEIGHT BEARING RESTRICTIONS: No  FALLS:  Has patient fallen in last 6 months? Yes. Number of falls 1 and near falls  LIVING ENVIRONMENT: Has following equipment at home: [employer] - 2 wheeled *Pt does not use RW  often  OCCUPATION: Retired, after scleroderma dx  PLOF: Independent  PATIENT GOALS: To get moving again   NEXT MD VISIT: N/A  OBJECTIVE:  Note: Objective measures were completed at Evaluation unless otherwise noted.  DIAGNOSTIC FINDINGS:  CLINICAL DATA:  The patient suffered cervical and thoracic spine compression fractures in a fall 03/25/2023. Subsequent encounter.   EXAM: MRI CERVICAL SPINE WITHOUT CONTRAST   TECHNIQUE: Multiplanar, multisequence MR imaging of the cervical spine was performed. No intravenous contrast was administered.   COMPARISON:  CT chest 04/16/2023.   FINDINGS: Alignment: No listhesis. Mild kyphosis is centered about the T1 level.   Vertebrae: The patient has a superior endplate compression fracture of C7 with vertebral body height loss of approximately 30%, a biconcave compression fracture of T1 vertebral body height loss of approximately 50% and a biconcave compression fracture T3 with vertebral body height loss centrally of approximately 80%. There is marrow edema within each of the vertebral bodies consistent with acute or early subacute injuries. Marrow edema is also seen in the right fifth and sixth ribs due to the fractures seen on the prior CT.   Cord: Normal signal throughout the visualized cord.   Posterior Fossa, vertebral arteries, paraspinal tissues:  Negative.   IMPRESSION: 1. Acute or early subacute compression fractures of C7, T1 and T3 as described above. No retropulsion off the superior endplates of C7 or T1. The central spinal canal and neural foramina are open at all levels. 2. Marrow edema in the right fifth and sixth ribs due to the fractures seen on the prior CT.  PATIENT SURVEYS:  FOTO 48/57  COGNITION: Overall cognitive status: Within functional limits for tasks assessed  SENSATION: WFL  POSTURE: rounded shoulders and forward head  PALPATION: No TTP noted    CERVICAL ROM:   Active ROM A/PROM (deg) eval  Flexion 45 deg  Extension 10 deg  Right lateral flexion 40 deg  Left lateral flexion 35 deg*  Right rotation 40 deg  Left rotation 25 deg*   (Blank rows = not tested)  All cervical PROM= AROM   UPPER EXTREMITY ROM:  Active ROM Right eval Left eval  Shoulder flexion    Shoulder extension    Shoulder abduction 100 deg   Shoulder adduction    Shoulder extension    Shoulder internal rotation C7 C7  Shoulder external rotation Mid- thoracic Mid- thoracic  Elbow flexion    Elbow extension    Wrist flexion    Wrist extension    Wrist ulnar deviation    Wrist radial deviation    Wrist pronation    Wrist supination     (Blank rows = not tested)  UPPER EXTREMITY MMT:  MMT Right eval Left eval  Shoulder flexion 4 4-  Shoulder extension    Shoulder abduction 4+ 4+  Shoulder adduction    Shoulder extension    Shoulder internal rotation 4 4  Shoulder external rotation 4 4  Middle trapezius    Lower trapezius    Elbow flexion 4+ 4+  Elbow extension 4+ 4+  Wrist flexion    Wrist extension    Wrist ulnar deviation    Wrist radial deviation    Wrist pronation    Wrist supination    Grip strength 4 4   (Blank rows = not tested)  CERVICAL SPECIAL TESTS:  Deferred 2/2 to limited cervical mobility  FUNCTIONAL TESTS:  DNF Endurance Test: Deferred to next session   SLS assessment RLE- 16  seconds LLE- 27  seconds w/ bilat UE support 07/08/23  TODAY'S TREATMENT: DATE: 06/29/24  ***  Subjective: Patient reports no pain in her neck this date.   Therapeutic Activity:  UBE x 5 min (2 fwd/retro dir) x level 12-8 for UE muscle strength and endurance; PT manually adjusted resistance to patient tolerance.    Seated row with GTB    3 x 10   Seated B shoulder abduction    3 x 10 - 2# DB     Seated B shoulder flexion    3 x 10 - 2 #DB   Wall Sit with Shoulder Abduction for core strengthening, LE Strength, shoulder strength  3 x 10 second hold - 2# DB donned on wrist    Manual Therapy:   10 minutes total in supine for passive L cervical rotation, L lateral flexion stretches.  Light STM applied to R upper neck musculature in order to increase cervical flexibility and decrease muscle tension. Myofascial release technique utilized. Minimal improvements with L cervical rotation.     PATIENT EDUCATION:  Education details: Education on Technique with exercises  Person educated: Patient Education method: Medical illustrator Education comprehension: verbalized understanding and returned demonstration  HOME EXERCISE PROGRAM: Access Code: [email or web address] URL: [email or web address] Date: 05/17/2024 Prepared by: Lonni Pall  Exercises - Supine Chin Tuck  - 1 x daily - 4-5 x weekly - 2 sets - 10 reps - Cervical Extension AROM with Strap  - 1 x daily - 4-5 x weekly - 2 sets - 10 reps - Seated Levator Scapulae Stretch  - 1 x daily - 4-5 x weekly - 1 sets - 2 reps - 30 hold - Wall Quarter Squat  - 1 x daily - 3-4 x weekly - 2-3 sets - 15 hold - Standard Plank  - 1 x daily - 3-4 x weekly - 2-3 sets - 15 hold - Scapular Retraction with Resistance  - 1 x daily - 7 x weekly - 2-3 sets - 10-12 reps - Shoulder External Rotation and Scapular Retraction with Resistance  - 1 x daily - 7 x weekly - 2-3 sets - 10-12 reps - Arm Circles on Swiss Ball  - 1 x daily - 7 x weekly  - 2-3 sets - 10 reps - Standing Single Arm Shoulder Abduction with Dumbbell - Palm Down  - 1 x daily - 7 x weekly - 2-3 sets - 8-10 reps  Access Code: [email or web address] URL: [email or web address] Date: 01/19/2024 Prepared by: Lonni Pall  Exercises - Supine Chin Tuck  - 1 x daily - 4-5 x weekly - 2 sets - 10 reps - Cervical Extension AROM with Strap  - 1 x daily - 4-5 x weekly - 2 sets - 10 reps - Seated Levator Scapulae Stretch  - 1 x daily - 4-5 x weekly - 1 sets - 2 reps - 30 hold - Seated scapular retraction   - 1 x daily - 4-5 x weekly - 2 sets - 10 reps - Standing Tandem Balance with Counter Support  - 1 x daily - 3-4 x weekly - 1 sets - 3 reps - 30 hold - Standing Single Leg Stance with Counter Support  - 1 x daily - 3-4 x weekly - 1 sets - 3 reps - 30 hold - Wall Quarter Squat  - 1 x daily - 3-4 x weekly - 2-3 sets - 15 hold - Standard Plank  - 1 x daily - 3-4 x weekly - 2-3 sets - 15 hold - Scapular Retraction  with Resistance  - 1 x daily - 7 x weekly - 2-3 sets - 10-12 reps - Shoulder External Rotation and Scapular Retraction with Resistance  - 1 x daily - 7 x weekly - 2-3 sets - 10-12 reps  Access Code: [email or web address] URL: [email or web address] Date: 08/12/2023 Prepared by: Dorina Kingfisher  Exercises - Supine Chin Tuck  - 1 x daily - 4-5 x weekly - 2 sets - 10 reps - Cervical Extension AROM with Strap  - 1 x daily - 4-5 x weekly - 2 sets - 10 reps - Seated Levator Scapulae Stretch  - 1 x daily - 4-5 x weekly - 1 sets - 2 reps - 30 hold - Seated scapular retraction   - 1 x daily - 4-5 x weekly - 2 sets - 10 reps - Standing Tandem Balance with Counter Support  - 1 x daily - 3-4 x weekly - 1 sets - 3 reps - 30 hold - Standing Single Leg Stance with Counter Support  - 1 x daily - 3-4 x weekly - 1 sets - 3 reps - 30 hold   Access Code: [email or web address] URL: [email or web address] Date: 06/24/2023 Prepared by: Sidra Simpers  Exercises - Supine Chin Tuck   - 1 x daily - 4-5 x weekly - 2 sets - 10 reps - Cervical Extension AROM with Strap  - 1 x daily - 4-5 x weekly - 2 sets - 10 reps - Seated Levator Scapulae Stretch  - 1 x daily - 4-5 x weekly - 1 sets - 2 reps - 30 hold - Seated scapular retraction   - 1 x daily - 4-5 x weekly - 2 sets - 10 reps  ASSESSMENT:  CLINICAL IMPRESSION:  ***  Continued PT POC with continued focus on addressing cervicothoracic and scapular mobility deficits. Focused on total body strengthening and preserving cervical ROM/UE strength. She still continues to present with significant limitations in cervical ROM secondary to myofascial restrictions/chronic conditions.  Without PT interventions patient's functional status has potentional to regress leading to further injury and poor QoL. She will continue to benefit from skilled PT in order to maintain current function and decrease regression in QoL.  OBJECTIVE IMPAIRMENTS: decreased mobility, decreased ROM, decreased strength, hypomobility, and impaired flexibility.   ACTIVITY LIMITATIONS: carrying and lifting  PARTICIPATION LIMITATIONS: community activity  PERSONAL FACTORS: Age, Past/current experiences, Time since onset of injury/illness/exacerbation, and 3+ comorbidities: scleroderma, osteoporosis, hx of cancer are also affecting patient's functional outcome.   REHAB POTENTIAL: Fair multi- comorbidities  CLINICAL DECISION MAKING: Evolving/moderate complexity  EVALUATION COMPLEXITY: Moderate   GOALS: Goals reviewed with patient? Yes  SHORT TERM GOALS: Target date: 12/01/23  Pt will be independent with HEP to improve cervical and shoulder strength with functional activities   Baseline: 06/24/23: HEP given to pt; 09/02/23: 25% compliance; 11/03/23: 25% compliance; 05/03/2024: < 50% adherence (limited to just AROM) Goal status: ON GOING   LONG TERM GOALS: Target date: 06/28/2024  Pt will improve FOTO to target score to demonstrate clinically significant  improvement in functional mobility.   Baseline: 06/24/23: 46/58; 09/02/23: 47/58; 11/03/23: 50/58 Goal status: DEFERRED  2.  Pt will improve all R/L shoulder MMT 4+ to improve functional mobility of bilat UE's.  Baseline: 06/24/23; 09/02/23; 11/03/23; MMT Right eval Left eval Left  09/02/23 Right 09/02/23 Right  11/03/23 Left 11/03/23 Right  04/06/24 Left  04/06/24 R/L 05/03/24  Shoulder flexion 4 4- 4- 4- 4 4+ 4- 4 4/5  Shoulder extension  Shoulder abduction 4+ 4+ 5 4+ 4+ 5 5 5  4-/4+  Shoulder adduction           Shoulder extension           Shoulder internal rotation 4 4 4+ 4+ 4 5 4 5  5/5  Shoulder external rotation 4 4 4+ 4+ 4 5 4 5  5/5           Goal status: PARTIALLY MET  3.  Pt will improve all cervical AROM to [facility] to improve functional mobility.  Baseline: 06/24/23:  Active ROM A/PROM (deg) eval A/PROM (Deg)  09/02/23 A/PROM (Deg)  11/03/23 A/PROM (Deg)  04/06/24 A/PROM  (Deg) 05/03/2024  Flexion 45 deg 45 45 35 35  Extension 10 deg 10 10 15  < 10  Right lateral flexion 40 deg 20 40 30 30  Left lateral flexion 35 deg* 35 40 20 20  Right rotation 40 deg 35 40 40 35  Left rotation 25 deg* 25 30 25 30    Goal status: PARTIALLY MET  PLAN:  PT FREQUENCY: 1x/week  PT DURATION: 8 weeks (bi weekly schedule)   PLANNED INTERVENTIONS: Therapeutic exercises, Therapeutic activity, Neuromuscular re-education, Balance training, Gait training, Patient/Family education, Self Care, Joint mobilization, Spinal manipulation, Spinal mobilization, Cryotherapy, Moist heat, Manual therapy, and Re-evaluation  PLAN FOR NEXT SESSION: Cervical manual therapy/stretching. Progress postural strengthening exercises. Overhead head motions/strengthening. Compound movement patterns for cervicothoracic mobility and shoulder strength.  Maryanne Finder, PT, DPT Physical Therapist - [facility] 06/29/24, 5:40 PM

## 2024-06-30 ENCOUNTER — Ambulatory Visit: Attending: Internal Medicine

## 2024-06-30 DIAGNOSIS — M542 Cervicalgia: Secondary | ICD-10-CM | POA: Diagnosis present

## 2024-06-30 DIAGNOSIS — M6281 Muscle weakness (generalized): Secondary | ICD-10-CM | POA: Insufficient documentation

## 2024-07-05 ENCOUNTER — Encounter

## 2024-07-13 NOTE — Therapy (Incomplete)
 OUTPATIENT PHYSICAL THERAPY CERVICAL TREATMENT   Patient Name: Whitney Mclean MRN: [id number] DOB:1966-05-12, 58 y.o., female Today's Date: 07/13/2024  END OF SESSION:         Past Medical History:  Diagnosis Date   Hypertension    Oral cancer (HCC)    Personal history of chemotherapy    Personal history of radiation therapy    Scleroderma (HCC)    Past Surgical History:  Procedure Laterality Date   ABDOMINAL HYSTERECTOMY     Patient Active Problem List   Diagnosis Date Noted   Other secondary kyphosis, cervicothoracic region 05/19/2023   Compression fracture of T3 vertebra (HCC) 05/19/2023   Compression fracture of C7 vertebra (HCC) 05/19/2023   Osteoporosis 05/19/2023    PCP: Oneil PHEBE Pinal, MD  REFERRING PROVIDER: Oneil PHEBE Pinal, MD   REFERRING DIAG: S12.600A (ICD-10-CM) - Unspecified displaced fracture of seventh cervical vertebra, initial encounter for closed fracture  THERAPY DIAG:  Cervicalgia  Muscle weakness (generalized)  Stiffness of right shoulder, not elsewhere classified  Rationale for Evaluation and Treatment: Rehabilitation  ONSET DATE: July 2024  SUBJECTIVE:                                                                                                                                                                                                         SUBJECTIVE STATEMENT: 05/31/24: Patient without pain in her neck. She has been adherent to HEP and utilizing light dumbbells. No questions or concerns.   PERTINENT HISTORY:  Pt presents to PT w/ chronic limited cervical mobility 2/2 severe osteoporosis, scleroderma, and hx of cervical fibrosis of the neck. Since previous bout of PT patient had a fall in the restroom resulting in three fx vertebrae, fx of six ribs, and a fx of her R shoulder blade. Pt's current fx are being managed non-surgically. Pt reports no pain or sx of N/T. She states she has the most difficulty with maintaining a  seated position with no back support and is only able to maintain this position for 2-3 minutes before requiring back support. Pt also reports an increased fear of falling.   PAIN:  Are you having pain? No Main aggravation cannot sit up without back support, increased fear of falling.  Easing factors: having a supported back 2-3 minutes of sitting unsupported before needing support  PRECAUTIONS: Fall  RED FLAGS: None     WEIGHT BEARING RESTRICTIONS: No  FALLS:  Has patient fallen in last 6 months? Yes. Number of falls 1 and near falls  LIVING ENVIRONMENT: Has following equipment at home:  Myleen Brailsford - 2 wheeled *Pt does not use RW often  OCCUPATION: Retired, after scleroderma dx  PLOF: Independent  PATIENT GOALS: To get moving again   NEXT MD VISIT: N/A  OBJECTIVE:  Note: Objective measures were completed at Evaluation unless otherwise noted.  DIAGNOSTIC FINDINGS:  CLINICAL DATA:  The patient suffered cervical and thoracic spine compression fractures in a fall 03/25/2023. Subsequent encounter.   EXAM: MRI CERVICAL SPINE WITHOUT CONTRAST   TECHNIQUE: Multiplanar, multisequence MR imaging of the cervical spine was performed. No intravenous contrast was administered.   COMPARISON:  CT chest 04/16/2023.   FINDINGS: Alignment: No listhesis. Mild kyphosis is centered about the T1 level.   Vertebrae: The patient has a superior endplate compression fracture of C7 with vertebral body height loss of approximately 30%, a biconcave compression fracture of T1 vertebral body height loss of approximately 50% and a biconcave compression fracture T3 with vertebral body height loss centrally of approximately 80%. There is marrow edema within each of the vertebral bodies consistent with acute or early subacute injuries. Marrow edema is also seen in the right fifth and sixth ribs due to the fractures seen on the prior CT.   Cord: Normal signal throughout the visualized cord.    Posterior Fossa, vertebral arteries, paraspinal tissues: Negative.   IMPRESSION: 1. Acute or early subacute compression fractures of C7, T1 and T3 as described above. No retropulsion off the superior endplates of C7 or T1. The central spinal canal and neural foramina are open at all levels. 2. Marrow edema in the right fifth and sixth ribs due to the fractures seen on the prior CT.  PATIENT SURVEYS:  FOTO 48/57  COGNITION: Overall cognitive status: Within functional limits for tasks assessed  SENSATION: WFL  POSTURE: rounded shoulders and forward head  PALPATION: No TTP noted    CERVICAL ROM:   Active ROM A/PROM (deg) eval  Flexion 45 deg  Extension 10 deg  Right lateral flexion 40 deg  Left lateral flexion 35 deg*  Right rotation 40 deg  Left rotation 25 deg*   (Blank rows = not tested)  All cervical PROM= AROM   UPPER EXTREMITY ROM:  Active ROM Right eval Left eval  Shoulder flexion    Shoulder extension    Shoulder abduction 100 deg   Shoulder adduction    Shoulder extension    Shoulder internal rotation C7 C7  Shoulder external rotation Mid- thoracic Mid- thoracic  Elbow flexion    Elbow extension    Wrist flexion    Wrist extension    Wrist ulnar deviation    Wrist radial deviation    Wrist pronation    Wrist supination     (Blank rows = not tested)  UPPER EXTREMITY MMT:  MMT Right eval Left eval  Shoulder flexion 4 4-  Shoulder extension    Shoulder abduction 4+ 4+  Shoulder adduction    Shoulder extension    Shoulder internal rotation 4 4  Shoulder external rotation 4 4  Middle trapezius    Lower trapezius    Elbow flexion 4+ 4+  Elbow extension 4+ 4+  Wrist flexion    Wrist extension    Wrist ulnar deviation    Wrist radial deviation    Wrist pronation    Wrist supination    Grip strength 4 4   (Blank rows = not tested)  CERVICAL SPECIAL TESTS:  Deferred 2/2 to limited cervical mobility  FUNCTIONAL TESTS:  DNF  Endurance Test: Deferred to next session  SLS assessment RLE- 16 seconds LLE- 27 seconds w/ bilat UE support 07/08/23  TODAY'S TREATMENT: DATE: 07/13/24   ***  Subjective: Patient reports no pain in her neck this date.   Therapeutic Activity:  UBE x 5 min (2 fwd/retro dir) x level 12-8 for UE muscle strength and endurance; PT manually adjusted resistance to patient tolerance.    Seated B shoulder abduction    3 x 10 - 2# DB    Sidelying shoulder IR    3 x 10 - 2# DB each UE   Seated row with GTB    3 x 10     Seated B shoulder flexion    3 x 10 - 2 #DB     PATIENT EDUCATION:  Education details: Education on Technique with exercises  Person educated: Patient Education method: Medical illustrator Education comprehension: verbalized understanding and returned demonstration  HOME EXERCISE PROGRAM: Access Code: [email or web address] URL: [email or web address] Date: 05/17/2024 Prepared by: Lonni Pall  Exercises - Supine Chin Tuck  - 1 x daily - 4-5 x weekly - 2 sets - 10 reps - Cervical Extension AROM with Strap  - 1 x daily - 4-5 x weekly - 2 sets - 10 reps - Seated Levator Scapulae Stretch  - 1 x daily - 4-5 x weekly - 1 sets - 2 reps - 30 hold - Wall Quarter Squat  - 1 x daily - 3-4 x weekly - 2-3 sets - 15 hold - Standard Plank  - 1 x daily - 3-4 x weekly - 2-3 sets - 15 hold - Scapular Retraction with Resistance  - 1 x daily - 7 x weekly - 2-3 sets - 10-12 reps - Shoulder External Rotation and Scapular Retraction with Resistance  - 1 x daily - 7 x weekly - 2-3 sets - 10-12 reps - Arm Circles on Swiss Ball  - 1 x daily - 7 x weekly - 2-3 sets - 10 reps - Standing Single Arm Shoulder Abduction with Dumbbell - Palm Down  - 1 x daily - 7 x weekly - 2-3 sets - 8-10 reps  Access Code: [email or web address] URL: [email or web address] Date: 01/19/2024 Prepared by: Lonni Pall  Exercises - Supine Chin Tuck  - 1 x daily - 4-5 x weekly - 2 sets - 10  reps - Cervical Extension AROM with Strap  - 1 x daily - 4-5 x weekly - 2 sets - 10 reps - Seated Levator Scapulae Stretch  - 1 x daily - 4-5 x weekly - 1 sets - 2 reps - 30 hold - Seated scapular retraction   - 1 x daily - 4-5 x weekly - 2 sets - 10 reps - Standing Tandem Balance with Counter Support  - 1 x daily - 3-4 x weekly - 1 sets - 3 reps - 30 hold - Standing Single Leg Stance with Counter Support  - 1 x daily - 3-4 x weekly - 1 sets - 3 reps - 30 hold - Wall Quarter Squat  - 1 x daily - 3-4 x weekly - 2-3 sets - 15 hold - Standard Plank  - 1 x daily - 3-4 x weekly - 2-3 sets - 15 hold - Scapular Retraction with Resistance  - 1 x daily - 7 x weekly - 2-3 sets - 10-12 reps - Shoulder External Rotation and Scapular Retraction with Resistance  - 1 x daily - 7 x weekly - 2-3 sets - 10-12 reps  Access Code: [email or web address] URL: [email or web address] Date: 08/12/2023 Prepared by:  Milton Fairly  Exercises - Supine Chin Tuck  - 1 x daily - 4-5 x weekly - 2 sets - 10 reps - Cervical Extension AROM with Strap  - 1 x daily - 4-5 x weekly - 2 sets - 10 reps - Seated Levator Scapulae Stretch  - 1 x daily - 4-5 x weekly - 1 sets - 2 reps - 30 hold - Seated scapular retraction   - 1 x daily - 4-5 x weekly - 2 sets - 10 reps - Standing Tandem Balance with Counter Support  - 1 x daily - 3-4 x weekly - 1 sets - 3 reps - 30 hold - Standing Single Leg Stance with Counter Support  - 1 x daily - 3-4 x weekly - 1 sets - 3 reps - 30 hold   Access Code: [email or web address] URL: [email or web address] Date: 06/24/2023 Prepared by: Sidra Simpers  Exercises - Supine Chin Tuck  - 1 x daily - 4-5 x weekly - 2 sets - 10 reps - Cervical Extension AROM with Strap  - 1 x daily - 4-5 x weekly - 2 sets - 10 reps - Seated Levator Scapulae Stretch  - 1 x daily - 4-5 x weekly - 1 sets - 2 reps - 30 hold - Seated scapular retraction   - 1 x daily - 4-5 x weekly - 2 sets - 10 reps  ASSESSMENT:  CLINICAL  IMPRESSION:   *** Continued PT POC with continued focus on addressing cervicothoracic and scapular mobility deficits. Focused on total body strengthening and preserving cervical ROM/UE strength. Goals reviewed with patient making progress towards goals, however still has not met goals below. Patient's condition has the potential to improve in response to therapy. Maximum improvement is yet to be obtained. The anticipated improvement is attainable and reasonable in a generally predictable time. She still continues to present with significant limitations in cervical ROM secondary to myofascial restrictions/chronic conditions.  Without PT interventions patient's functional status has potentional to regress leading to further injury and poor QoL. She will continue to benefit from skilled PT in order to maintain current function and decrease regression in QoL.  OBJECTIVE IMPAIRMENTS: decreased mobility, decreased ROM, decreased strength, hypomobility, and impaired flexibility.   ACTIVITY LIMITATIONS: carrying and lifting  PARTICIPATION LIMITATIONS: community activity  PERSONAL FACTORS: Age, Past/current experiences, Time since onset of injury/illness/exacerbation, and 3+ comorbidities: scleroderma, osteoporosis, hx of cancer are also affecting patient's functional outcome.   REHAB POTENTIAL: Fair multi- comorbidities  CLINICAL DECISION MAKING: Evolving/moderate complexity  EVALUATION COMPLEXITY: Moderate   GOALS: Goals reviewed with patient? Yes  SHORT TERM GOALS: Target date: 12/01/23  Pt will be independent with HEP to improve cervical and shoulder strength with functional activities   Baseline: 06/24/23: HEP given to pt; 09/02/23: 25% compliance; 11/03/23: 25% compliance; 05/03/2024: < 50% adherence (limited to just AROM) Goal status: ON GOING   LONG TERM GOALS: Target date: 06/28/2024  Pt will improve FOTO to target score to demonstrate clinically significant improvement in functional  mobility.   Baseline: 06/24/23: 46/58; 09/02/23: 47/58; 11/03/23: 50/58 Goal status: DEFERRED  2.  Pt will improve all R/L shoulder MMT 4+ to improve functional mobility of bilat UE's.  Baseline: 06/24/23; 09/02/23; 11/03/23; MMT Right eval Left eval Left  09/02/23 Right 09/02/23 Right  11/03/23 Left 11/03/23 Right  04/06/24 Left  04/06/24 R/L 05/03/24 R/L  06/30/2024  Shoulder flexion 4 4- 4- 4- 4 4+ 4- 4 4/5 4/5  Shoulder extension  Shoulder abduction 4+ 4+ 5 4+ 4+ 5 5 5  4-/4+ 4-/4+  Shoulder adduction            Shoulder extension            Shoulder internal rotation 4 4 4+ 4+ 4 5 4 5  5/5 4/4  Shoulder external rotation 4 4 4+ 4+ 4 5 4 5  5/5 4/4           Goal status: ONGOING   3.  Pt will improve all cervical AROM to [facility] to improve functional mobility.  Baseline: 06/24/23:  Active ROM A/PROM (deg) eval A/PROM (Deg)  09/02/23 A/PROM (Deg)  11/03/23 A/PROM (Deg)  04/06/24 A/PROM  (Deg) 05/03/2024 A/PROM  (Deg) 06/30/2024  Flexion 45 deg 45 45 35 35 35  Extension 10 deg 10 10 15  < 10 <10  Right lateral flexion 40 deg 20 40 30 30 38  Left lateral flexion 35 deg* 35 40 20 20 30   Right rotation 40 deg 35 40 40 35 35  Left rotation 25 deg* 25 30 25 30 25    Goal status: ONGOING  PLAN:  PT FREQUENCY: 1x/week  PT DURATION:  12 weeks (bi weekly schedule)   PLANNED INTERVENTIONS: Therapeutic exercises, Therapeutic activity, Neuromuscular re-education, Balance training, Gait training, Patient/Family education, Self Care, Joint mobilization, Spinal manipulation, Spinal mobilization, Cryotherapy, Moist heat, Manual therapy, and Re-evaluation  PLAN FOR NEXT SESSION: Cervical manual therapy/stretching. Progress postural strengthening exercises. Overhead head motions/strengthening. Compound movement patterns for cervicothoracic mobility and shoulder strength.  Maryanne Finder, PT, DPT Physical Therapist - [facility] 07/13/24, 4:20  PM

## 2024-07-14 ENCOUNTER — Ambulatory Visit

## 2024-07-14 DIAGNOSIS — M542 Cervicalgia: Secondary | ICD-10-CM

## 2024-07-14 DIAGNOSIS — M6281 Muscle weakness (generalized): Secondary | ICD-10-CM

## 2024-07-14 DIAGNOSIS — M25611 Stiffness of right shoulder, not elsewhere classified: Secondary | ICD-10-CM

## 2024-07-28 ENCOUNTER — Ambulatory Visit

## 2024-08-11 ENCOUNTER — Ambulatory Visit: Attending: Internal Medicine

## 2024-08-11 DIAGNOSIS — M6281 Muscle weakness (generalized): Secondary | ICD-10-CM | POA: Diagnosis present

## 2024-08-11 DIAGNOSIS — M542 Cervicalgia: Secondary | ICD-10-CM | POA: Insufficient documentation

## 2024-08-11 DIAGNOSIS — M25611 Stiffness of right shoulder, not elsewhere classified: Secondary | ICD-10-CM | POA: Insufficient documentation

## 2024-08-11 NOTE — Therapy (Signed)
 OUTPATIENT PHYSICAL THERAPY CERVICAL TREATMENT   Patient Name: ANNALEIGH Mclean MRN: [id number] DOB:[birth year], 58 y.o., female Today's Date: 08/11/2024  END OF SESSION:  PT End of Session - 08/11/24 1430     Visit Number 35    Number of Visits 50    Date for Recertification  09/22/24    Authorization Type 2025 - Calendar VL 30    Authorization Time Period 09/24/23-09/22/24    Authorization - Visit Number 24    Authorization - Number of Visits 30    Progress Note Due on Visit 40    PT Start Time 1430    PT Stop Time 1510    PT Time Calculation (min) 40 min    Activity Tolerance Patient tolerated treatment well    Behavior During Therapy WFL for tasks assessed/performed                Past Medical History:  Diagnosis Date   Hypertension    Oral cancer (HCC)    Personal history of chemotherapy    Personal history of radiation therapy    Scleroderma (HCC)    Past Surgical History:  Procedure Laterality Date   ABDOMINAL HYSTERECTOMY     Patient Active Problem List   Diagnosis Date Noted   Other secondary kyphosis, cervicothoracic region 05/19/2023   Compression fracture of T3 vertebra (HCC) 05/19/2023   Compression fracture of C7 vertebra (HCC) 05/19/2023   Osteoporosis 05/19/2023    PCP: Whitney PHEBE Pinal, MD  REFERRING PROVIDER: Oneil PHEBE Pinal, MD   REFERRING DIAG: S12.600A (ICD-10-CM) - Unspecified displaced fracture of seventh cervical vertebra, initial encounter for closed fracture  THERAPY DIAG:  Cervicalgia  Muscle weakness (generalized)  Stiffness of right shoulder, not elsewhere classified  Rationale for Evaluation and Treatment: Rehabilitation  ONSET DATE: July 2024  SUBJECTIVE:                                                                                                                                                                                                         SUBJECTIVE STATEMENT: 05/31/24: Patient without pain in her  neck. She has been adherent to HEP and utilizing light dumbbells. No questions or concerns.   PERTINENT HISTORY:  Pt presents to PT w/ chronic limited cervical mobility 2/2 severe osteoporosis, scleroderma, and hx of cervical fibrosis of the neck. Since previous bout of PT patient had a fall in the restroom resulting in three fx vertebrae, fx of six ribs, and a fx of her R shoulder blade. Pt's current fx are being managed non-surgically. Pt reports  no pain or sx of N/T. She states she has the most difficulty with maintaining a seated position with no back support and is only able to maintain this position for 2-3 minutes before requiring back support. Pt also reports an increased fear of falling.   PAIN:  Are you having pain? No Main aggravation cannot sit up without back support, increased fear of falling.  Easing factors: having a supported back 2-3 minutes of sitting unsupported before needing support  PRECAUTIONS: Fall  RED FLAGS: None     WEIGHT BEARING RESTRICTIONS: No  FALLS:  Has patient fallen in last 6 months? Yes. Number of falls 1 and near falls  LIVING ENVIRONMENT: Has following equipment at home: Walker - 2 wheeled *Pt does not use RW often  OCCUPATION: Retired, after scleroderma dx  PLOF: Independent  PATIENT GOALS: To get moving again   NEXT MD VISIT: N/A  OBJECTIVE:  Note: Objective measures were completed at Evaluation unless otherwise noted.  DIAGNOSTIC FINDINGS:  CLINICAL DATA:  The patient suffered cervical and thoracic spine compression fractures in a fall 03/25/2023. Subsequent encounter.   EXAM: MRI CERVICAL SPINE WITHOUT CONTRAST   TECHNIQUE: Multiplanar, multisequence MR imaging of the cervical spine was performed. No intravenous contrast was administered.   COMPARISON:  CT chest 04/16/2023.   FINDINGS: Alignment: No listhesis. Mild kyphosis is centered about the T1 level.   Vertebrae: The patient has a superior endplate compression  fracture of C7 with vertebral body height loss of approximately 30%, a biconcave compression fracture of T1 vertebral body height loss of approximately 50% and a biconcave compression fracture T3 with vertebral body height loss centrally of approximately 80%. There is marrow edema within each of the vertebral bodies consistent with acute or early subacute injuries. Marrow edema is also seen in the right fifth and sixth ribs due to the fractures seen on the prior CT.   Cord: Normal signal throughout the visualized cord.   Posterior Fossa, vertebral arteries, paraspinal tissues: Negative.   IMPRESSION: 1. Acute or early subacute compression fractures of C7, T1 and T3 as described above. No retropulsion off the superior endplates of C7 or T1. The central spinal canal and neural foramina are open at all levels. 2. Marrow edema in the right fifth and sixth ribs due to the fractures seen on the prior CT.  PATIENT SURVEYS:  FOTO 48/57  COGNITION: Overall cognitive status: Within functional limits for tasks assessed  SENSATION: WFL  POSTURE: rounded shoulders and forward head  PALPATION: No TTP noted    CERVICAL ROM:   Active ROM A/PROM (deg) eval  Flexion 45 deg  Extension 10 deg  Right lateral flexion 40 deg  Left lateral flexion 35 deg*  Right rotation 40 deg  Left rotation 25 deg*   (Blank rows = not tested)  All cervical PROM= AROM   UPPER EXTREMITY ROM:  Active ROM Right eval Left eval  Shoulder flexion    Shoulder extension    Shoulder abduction 100 deg   Shoulder adduction    Shoulder extension    Shoulder internal rotation C7 C7  Shoulder external rotation Mid- thoracic Mid- thoracic  Elbow flexion    Elbow extension    Wrist flexion    Wrist extension    Wrist ulnar deviation    Wrist radial deviation    Wrist pronation    Wrist supination     (Blank rows = not tested)  UPPER EXTREMITY MMT:  MMT Right eval Left eval  Shoulder  flexion 4 4-   Shoulder extension    Shoulder abduction 4+ 4+  Shoulder adduction    Shoulder extension    Shoulder internal rotation 4 4  Shoulder external rotation 4 4  Middle trapezius    Lower trapezius    Elbow flexion 4+ 4+  Elbow extension 4+ 4+  Wrist flexion    Wrist extension    Wrist ulnar deviation    Wrist radial deviation    Wrist pronation    Wrist supination    Grip strength 4 4   (Blank rows = not tested)  CERVICAL SPECIAL TESTS:  Deferred 2/2 to limited cervical mobility  FUNCTIONAL TESTS:  DNF Endurance Test: Deferred to next session   SLS assessment RLE- 16 seconds LLE- 27 seconds w/ bilat UE support 07/08/23  TODAY'S TREATMENT: DATE: 08/11/24     Subjective: Patient reports no pain at the start of the session. Missed the last couple weeks due to psoriasis flare up. No further and questions.   Therapeutic Activity (with intent to improve lifting, carrying tasks):  UBE x 5 min (2.5 min fwd/retro dir) x level 8-6 for UE muscle strength and endurance; PT manually adjusted resistance to patient tolerance.     Seated Arm Circles    2 x 10 Clock wise, counter clock wise     Seated B shoulder abduction    3 x 10 - 2# DB    Seated B shoulder flexion    3 x 10 - 2 #DB     Standing OH reach with DB to targets    3 x 10 - 5# DB   Hooklying Chest Press   2 x 10 - 8# weighted dowel    Manual Therapy:  Supine Cervical Stretches:  R/L Rotation - 1 min/bout x 3 ea direction in order to improve ROM and tissue extensibility   R/L Extension - 1 min/bout x 3 ea direction in order to improve ROM and tissue extensibility   R/L Sidebending - 1 min/bout x 3 ea direction in order to improve ROM and tissue extensibility  Minimal improvements with L rotation and sidebending. Passive end feel with hard block due to myofascial restrictions on R side of neck.        PATIENT EDUCATION:  Education details: Exercise Technique   Person educated: Patient Education method:  Medical Illustrator Education comprehension: verbalized understanding and returned demonstration  HOME EXERCISE PROGRAM: Access Code: [email or web address] URL: [email or web address] Date: 05/17/2024 Prepared by: Lonni Pall  Exercises - Supine Chin Tuck  - 1 x daily - 4-5 x weekly - 2 sets - 10 reps - Cervical Extension AROM with Strap  - 1 x daily - 4-5 x weekly - 2 sets - 10 reps - Seated Levator Scapulae Stretch  - 1 x daily - 4-5 x weekly - 1 sets - 2 reps - 30 hold - Wall Quarter Squat  - 1 x daily - 3-4 x weekly - 2-3 sets - 15 hold - Standard Plank  - 1 x daily - 3-4 x weekly - 2-3 sets - 15 hold - Scapular Retraction with Resistance  - 1 x daily - 7 x weekly - 2-3 sets - 10-12 reps - Shoulder External Rotation and Scapular Retraction with Resistance  - 1 x daily - 7 x weekly - 2-3 sets - 10-12 reps - Arm Circles on Swiss Ball  - 1 x daily - 7 x weekly - 2-3 sets - 10 reps - Standing Single Arm Shoulder Abduction  with Dumbbell - Palm Down  - 1 x daily - 7 x weekly - 2-3 sets - 8-10 reps  Access Code: [email or web address] URL: [email or web address] Date: 01/19/2024 Prepared by: Lonni Pall  Exercises - Supine Chin Tuck  - 1 x daily - 4-5 x weekly - 2 sets - 10 reps - Cervical Extension AROM with Strap  - 1 x daily - 4-5 x weekly - 2 sets - 10 reps - Seated Levator Scapulae Stretch  - 1 x daily - 4-5 x weekly - 1 sets - 2 reps - 30 hold - Seated scapular retraction   - 1 x daily - 4-5 x weekly - 2 sets - 10 reps - Standing Tandem Balance with Counter Support  - 1 x daily - 3-4 x weekly - 1 sets - 3 reps - 30 hold - Standing Single Leg Stance with Counter Support  - 1 x daily - 3-4 x weekly - 1 sets - 3 reps - 30 hold - Wall Quarter Squat  - 1 x daily - 3-4 x weekly - 2-3 sets - 15 hold - Standard Plank  - 1 x daily - 3-4 x weekly - 2-3 sets - 15 hold - Scapular Retraction with Resistance  - 1 x daily - 7 x weekly - 2-3 sets - 10-12 reps - Shoulder External  Rotation and Scapular Retraction with Resistance  - 1 x daily - 7 x weekly - 2-3 sets - 10-12 reps  Access Code: [email or web address] URL: [email or web address] Date: 08/12/2023 Prepared by: Dorina Kingfisher  Exercises - Supine Chin Tuck  - 1 x daily - 4-5 x weekly - 2 sets - 10 reps - Cervical Extension AROM with Strap  - 1 x daily - 4-5 x weekly - 2 sets - 10 reps - Seated Levator Scapulae Stretch  - 1 x daily - 4-5 x weekly - 1 sets - 2 reps - 30 hold - Seated scapular retraction   - 1 x daily - 4-5 x weekly - 2 sets - 10 reps - Standing Tandem Balance with Counter Support  - 1 x daily - 3-4 x weekly - 1 sets - 3 reps - 30 hold - Standing Single Leg Stance with Counter Support  - 1 x daily - 3-4 x weekly - 1 sets - 3 reps - 30 hold   Access Code: [email or web address] URL: [email or web address] Date: 06/24/2023 Prepared by: Sidra Simpers  Exercises - Supine Chin Tuck  - 1 x daily - 4-5 x weekly - 2 sets - 10 reps - Cervical Extension AROM with Strap  - 1 x daily - 4-5 x weekly - 2 sets - 10 reps - Seated Levator Scapulae Stretch  - 1 x daily - 4-5 x weekly - 1 sets - 2 reps - 30 hold - Seated scapular retraction   - 1 x daily - 4-5 x weekly - 2 sets - 10 reps  ASSESSMENT:  CLINICAL IMPRESSION:    Patient returns to PT following 5 week hiatus. Continued PT POC with continued focus on addressing cervicothoracic and scapular mobility deficits. She tolerated upper extremity strengthening exercises. PT utilized targets with upward reaching in order to facilitate cervical rotation; however she compensates with thoracic rotation and lumbar extension for lack of cervical extension. Seated rest breaks provided throughout session due to extreme fatigue. She still continues to present with significant limitations in cervical ROM secondary to myofascial restrictions/chronic conditions.  Without PT interventions patient's functional status has potentional to regress leading to further injury and  poor QoL. She will continue to benefit from skilled PT in order to maintain current function and decrease regression in QoL.  OBJECTIVE IMPAIRMENTS: decreased mobility, decreased ROM, decreased strength, hypomobility, and impaired flexibility.   ACTIVITY LIMITATIONS: carrying and lifting  PARTICIPATION LIMITATIONS: community activity  PERSONAL FACTORS: Age, Past/current experiences, Time since onset of injury/illness/exacerbation, and 3+ comorbidities: scleroderma, osteoporosis, hx of cancer are also affecting patient's functional outcome.   REHAB POTENTIAL: Fair multi- comorbidities  CLINICAL DECISION MAKING: Evolving/moderate complexity  EVALUATION COMPLEXITY: Moderate   GOALS: Goals reviewed with patient? Yes  SHORT TERM GOALS: Target date: 12/01/23  Pt will be independent with HEP to improve cervical and shoulder strength with functional activities   Baseline: 06/24/23: HEP given to pt; 09/02/23: 25% compliance; 11/03/23: 25% compliance; 05/03/2024: < 50% adherence (limited to just AROM) Goal status: ON GOING   LONG TERM GOALS: Target date: 06/28/2024  Pt will improve FOTO to target score to demonstrate clinically significant improvement in functional mobility.   Baseline: 06/24/23: 46/58; 09/02/23: 47/58; 11/03/23: 50/58 Goal status: DEFERRED  2.  Pt will improve all R/L shoulder MMT 4+ to improve functional mobility of bilat UE's.  Baseline: 06/24/23; 09/02/23; 11/03/23; MMT Right eval Left eval Left  09/02/23 Right 09/02/23 Right  11/03/23 Left 11/03/23 Right  04/06/24 Left  04/06/24 R/L 05/03/24 R/L  06/30/2024  Shoulder flexion 4 4- 4- 4- 4 4+ 4- 4 4/5 4/5  Shoulder extension            Shoulder abduction 4+ 4+ 5 4+ 4+ 5 5 5  4-/4+ 4-/4+  Shoulder adduction            Shoulder extension            Shoulder internal rotation 4 4 4+ 4+ 4 5 4 5  5/5 4/4  Shoulder external rotation 4 4 4+ 4+ 4 5 4 5  5/5 4/4           Goal status: ONGOING   3.  Pt will improve all  cervical AROM to [facility] to improve functional mobility.  Baseline: 06/24/23:  Active ROM A/PROM (deg) eval A/PROM (Deg)  09/02/23 A/PROM (Deg)  11/03/23 A/PROM (Deg)  04/06/24 A/PROM  (Deg) 05/03/2024 A/PROM  (Deg) 06/30/2024  Flexion 45 deg 45 45 35 35 35  Extension 10 deg 10 10 15  < 10 <10  Right lateral flexion 40 deg 20 40 30 30 38  Left lateral flexion 35 deg* 35 40 20 20 30   Right rotation 40 deg 35 40 40 35 35  Left rotation 25 deg* 25 30 25 30 25    Goal status: ONGOING  PLAN:  PT FREQUENCY: 1x/week  PT DURATION:  12 weeks (bi weekly schedule)   PLANNED INTERVENTIONS: Therapeutic exercises, Therapeutic activity, Neuromuscular re-education, Balance training, Gait training, Patient/Family education, Self Care, Joint mobilization, Spinal manipulation, Spinal mobilization, Cryotherapy, Moist heat, Manual therapy, and Re-evaluation  PLAN FOR NEXT SESSION: Cervical manual therapy/stretching. Progress postural strengthening exercises. Overhead head motions/strengthening. Compound movement patterns for cervicothoracic mobility and shoulder strength.  Lonni Pall PT, DPT Physical Therapist- [facility] 08/11/24, 2:32 PM

## 2024-09-01 ENCOUNTER — Ambulatory Visit: Attending: Internal Medicine

## 2024-09-01 DIAGNOSIS — M25611 Stiffness of right shoulder, not elsewhere classified: Secondary | ICD-10-CM

## 2024-09-01 DIAGNOSIS — M6281 Muscle weakness (generalized): Secondary | ICD-10-CM

## 2024-09-01 DIAGNOSIS — M542 Cervicalgia: Secondary | ICD-10-CM

## 2024-09-01 DIAGNOSIS — M79641 Pain in right hand: Secondary | ICD-10-CM

## 2024-09-01 NOTE — Therapy (Signed)
 OUTPATIENT PHYSICAL THERAPY CERVICAL TREATMENT   Patient Name: Whitney Mclean MRN: [id number] DOB:Mar 10, 1966, 58 y.o., female Today's Date: 09/01/2024  END OF SESSION:  PT End of Session - 09/01/24 1442     Visit Number 36    Number of Visits 50    Date for Recertification  09/22/24    Authorization Type 2025 - Calendar VL 30    Authorization Time Period 09/24/23-09/22/24    Authorization - Number of Visits 30    Progress Note Due on Visit 40    PT Start Time 1435    PT Stop Time 1515    PT Time Calculation (min) 40 min    Activity Tolerance Patient tolerated treatment well    Behavior During Therapy WFL for tasks assessed/performed                Past Medical History:  Diagnosis Date   Hypertension    Oral cancer (HCC)    Personal history of chemotherapy    Personal history of radiation therapy    Scleroderma (HCC)    Past Surgical History:  Procedure Laterality Date   ABDOMINAL HYSTERECTOMY     Patient Active Problem List   Diagnosis Date Noted   Other secondary kyphosis, cervicothoracic region 05/19/2023   Compression fracture of T3 vertebra (HCC) 05/19/2023   Compression fracture of C7 vertebra (HCC) 05/19/2023   Osteoporosis 05/19/2023    PCP: Oneil PHEBE Pinal, MD  REFERRING PROVIDER: Oneil PHEBE Pinal, MD   REFERRING DIAG: S12.600A (ICD-10-CM) - Unspecified displaced fracture of seventh cervical vertebra, initial encounter for closed fracture  THERAPY DIAG:  Cervicalgia  Muscle weakness (generalized)  Stiffness of right shoulder, not elsewhere classified  Pain in right hand  Rationale for Evaluation and Treatment: Rehabilitation  ONSET DATE: July 2024  SUBJECTIVE:                                                                                                                                                                                                         SUBJECTIVE STATEMENT: 05/31/24: Patient without pain in her neck. She has been  adherent to HEP and utilizing light dumbbells. No questions or concerns.   PERTINENT HISTORY:  Pt presents to PT w/ chronic limited cervical mobility 2/2 severe osteoporosis, scleroderma, and hx of cervical fibrosis of the neck. Since previous bout of PT patient had a fall in the restroom resulting in three fx vertebrae, fx of six ribs, and a fx of her R shoulder blade. Pt's current fx are being managed non-surgically. Pt reports no pain or  sx of N/T. She states she has the most difficulty with maintaining a seated position with no back support and is only able to maintain this position for 2-3 minutes before requiring back support. Pt also reports an increased fear of falling.   PAIN:  Are you having pain? No Main aggravation cannot sit up without back support, increased fear of falling.  Easing factors: having a supported back 2-3 minutes of sitting unsupported before needing support  PRECAUTIONS: Fall  RED FLAGS: None     WEIGHT BEARING RESTRICTIONS: No  FALLS:  Has patient fallen in last 6 months? Yes. Number of falls 1 and near falls  LIVING ENVIRONMENT: Has following equipment at home: Walker - 2 wheeled *Pt does not use RW often  OCCUPATION: Retired, after scleroderma dx  PLOF: Independent  PATIENT GOALS: To get moving again   NEXT MD VISIT: N/A  OBJECTIVE:  Note: Objective measures were completed at Evaluation unless otherwise noted.  DIAGNOSTIC FINDINGS:  CLINICAL DATA:  The patient suffered cervical and thoracic spine compression fractures in a fall 03/25/2023. Subsequent encounter.   EXAM: MRI CERVICAL SPINE WITHOUT CONTRAST   TECHNIQUE: Multiplanar, multisequence MR imaging of the cervical spine was performed. No intravenous contrast was administered.   COMPARISON:  CT chest 04/16/2023.   FINDINGS: Alignment: No listhesis. Mild kyphosis is centered about the T1 level.   Vertebrae: The patient has a superior endplate compression fracture of C7 with  vertebral body height loss of approximately 30%, a biconcave compression fracture of T1 vertebral body height loss of approximately 50% and a biconcave compression fracture T3 with vertebral body height loss centrally of approximately 80%. There is marrow edema within each of the vertebral bodies consistent with acute or early subacute injuries. Marrow edema is also seen in the right fifth and sixth ribs due to the fractures seen on the prior CT.   Cord: Normal signal throughout the visualized cord.   Posterior Fossa, vertebral arteries, paraspinal tissues: Negative.   IMPRESSION: 1. Acute or early subacute compression fractures of C7, T1 and T3 as described above. No retropulsion off the superior endplates of C7 or T1. The central spinal canal and neural foramina are open at all levels. 2. Marrow edema in the right fifth and sixth ribs due to the fractures seen on the prior CT.  PATIENT SURVEYS:  FOTO 48/57  COGNITION: Overall cognitive status: Within functional limits for tasks assessed  SENSATION: WFL  POSTURE: rounded shoulders and forward head  PALPATION: No TTP noted    CERVICAL ROM:   Active ROM A/PROM (deg) eval  Flexion 45 deg  Extension 10 deg  Right lateral flexion 40 deg  Left lateral flexion 35 deg*  Right rotation 40 deg  Left rotation 25 deg*   (Blank rows = not tested)  All cervical PROM= AROM   UPPER EXTREMITY ROM:  Active ROM Right eval Left eval  Shoulder flexion    Shoulder extension    Shoulder abduction 100 deg   Shoulder adduction    Shoulder extension    Shoulder internal rotation C7 C7  Shoulder external rotation Mid- thoracic Mid- thoracic  Elbow flexion    Elbow extension    Wrist flexion    Wrist extension    Wrist ulnar deviation    Wrist radial deviation    Wrist pronation    Wrist supination     (Blank rows = not tested)  UPPER EXTREMITY MMT:  MMT Right eval Left eval  Shoulder flexion 4 4-  Shoulder extension     Shoulder abduction 4+ 4+  Shoulder adduction    Shoulder extension    Shoulder internal rotation 4 4  Shoulder external rotation 4 4  Middle trapezius    Lower trapezius    Elbow flexion 4+ 4+  Elbow extension 4+ 4+  Wrist flexion    Wrist extension    Wrist ulnar deviation    Wrist radial deviation    Wrist pronation    Wrist supination    Grip strength 4 4   (Blank rows = not tested)  CERVICAL SPECIAL TESTS:  Deferred 2/2 to limited cervical mobility  FUNCTIONAL TESTS:  DNF Endurance Test: Deferred to next session   SLS assessment RLE- 16 seconds LLE- 27 seconds w/ bilat UE support 07/08/23  TODAY'S TREATMENT: DATE: 09/01/24     Subjective: Patient reports no pain at the start of the session. She is slightly saddened and lonely due to recent passing of dog. No further and questions.   Therapeutic Activity (with intent to improve lifting, carrying tasks):  UBE x 5 min (2.5 min fwd/retro dir) x level 8-6 for UE muscle strength and endurance; PT manually adjusted resistance to patient tolerance.     Seated Arm Circles    2 x 20 CW, CCW direction     Seated Shoulder Flexion  3 x 10 - 2# DB    Seated Shoulder Abd.  3 x 10 - 2# DB   Standing OH reach with DB to targets    3 x 10 - 5# DB   Hooklying Chest Press   2 x 10 - 8# weighted dowel    Hooklying OH reach    2 x 10 - 2 Kg MB   Manual Therapy (8 min unbilled):  Supine Cervical Stretches:  L Rotation - 1 min/bout x 3 bousea direction in order to improve ROM and tissue extensibility   Extension - 1 min/bout x 2 ea direction in order to improve ROM and tissue extensibility   L Sidebending - 1 min/bout x 3 ea direction in order to improve ROM and tissue extensibility  Minimal improvements with L rotation and sidebending. Passive end feel with hard block due to myofascial restrictions on R side of neck.    PATIENT EDUCATION:  Education details: Exercise Technique   Person educated: Patient Education  method: Medical Illustrator Education comprehension: verbalized understanding and returned demonstration  HOME EXERCISE PROGRAM: Access Code: [email or web address] URL: [email or web address] Date: 05/17/2024 Prepared by: Lonni Pall  Exercises - Supine Chin Tuck  - 1 x daily - 4-5 x weekly - 2 sets - 10 reps - Cervical Extension AROM with Strap  - 1 x daily - 4-5 x weekly - 2 sets - 10 reps - Seated Levator Scapulae Stretch  - 1 x daily - 4-5 x weekly - 1 sets - 2 reps - 30 hold - Wall Quarter Squat  - 1 x daily - 3-4 x weekly - 2-3 sets - 15 hold - Standard Plank  - 1 x daily - 3-4 x weekly - 2-3 sets - 15 hold - Scapular Retraction with Resistance  - 1 x daily - 7 x weekly - 2-3 sets - 10-12 reps - Shoulder External Rotation and Scapular Retraction with Resistance  - 1 x daily - 7 x weekly - 2-3 sets - 10-12 reps - Arm Circles on Swiss Ball  - 1 x daily - 7 x weekly - 2-3 sets - 10 reps - Standing Single Arm Shoulder  Abduction with Dumbbell - Palm Down  - 1 x daily - 7 x weekly - 2-3 sets - 8-10 reps  Access Code: [email or web address] URL: [email or web address] Date: 01/19/2024 Prepared by: Lonni Pall  Exercises - Supine Chin Tuck  - 1 x daily - 4-5 x weekly - 2 sets - 10 reps - Cervical Extension AROM with Strap  - 1 x daily - 4-5 x weekly - 2 sets - 10 reps - Seated Levator Scapulae Stretch  - 1 x daily - 4-5 x weekly - 1 sets - 2 reps - 30 hold - Seated scapular retraction   - 1 x daily - 4-5 x weekly - 2 sets - 10 reps - Standing Tandem Balance with Counter Support  - 1 x daily - 3-4 x weekly - 1 sets - 3 reps - 30 hold - Standing Single Leg Stance with Counter Support  - 1 x daily - 3-4 x weekly - 1 sets - 3 reps - 30 hold - Wall Quarter Squat  - 1 x daily - 3-4 x weekly - 2-3 sets - 15 hold - Standard Plank  - 1 x daily - 3-4 x weekly - 2-3 sets - 15 hold - Scapular Retraction with Resistance  - 1 x daily - 7 x weekly - 2-3 sets - 10-12 reps - Shoulder  External Rotation and Scapular Retraction with Resistance  - 1 x daily - 7 x weekly - 2-3 sets - 10-12 reps  Access Code: [email or web address] URL: [email or web address] Date: 08/12/2023 Prepared by: Dorina Kingfisher  Exercises - Supine Chin Tuck  - 1 x daily - 4-5 x weekly - 2 sets - 10 reps - Cervical Extension AROM with Strap  - 1 x daily - 4-5 x weekly - 2 sets - 10 reps - Seated Levator Scapulae Stretch  - 1 x daily - 4-5 x weekly - 1 sets - 2 reps - 30 hold - Seated scapular retraction   - 1 x daily - 4-5 x weekly - 2 sets - 10 reps - Standing Tandem Balance with Counter Support  - 1 x daily - 3-4 x weekly - 1 sets - 3 reps - 30 hold - Standing Single Leg Stance with Counter Support  - 1 x daily - 3-4 x weekly - 1 sets - 3 reps - 30 hold   Access Code: [email or web address] URL: [email or web address] Date: 06/24/2023 Prepared by: Sidra Simpers  Exercises - Supine Chin Tuck  - 1 x daily - 4-5 x weekly - 2 sets - 10 reps - Cervical Extension AROM with Strap  - 1 x daily - 4-5 x weekly - 2 sets - 10 reps - Seated Levator Scapulae Stretch  - 1 x daily - 4-5 x weekly - 1 sets - 2 reps - 30 hold - Seated scapular retraction   - 1 x daily - 4-5 x weekly - 2 sets - 10 reps  ASSESSMENT:  CLINICAL IMPRESSION:    Patient returns to PT following 5 week hiatus. Continued PT POC with continued focus on addressing cervicothoracic and scapular mobility deficits. She tolerated upper extremity strengthening exercises. PT focused on global shoulder strengthening as tolerated. Good ability to perform chest press without physical assistance from PT. Patient's strength has declined since switching the POC to 1x/biweekly. She is easily fatigued with each set of exercises. Seated rest breaks provided in order to mitigate fatigue. She still continues to present with significant limitations in cervical ROM secondary to myofascial restrictions/chronic conditions.   Without PT interventions  patient's  functional status has potentional to regress leading to further injury and poor QoL. She will continue to benefit from skilled PT in order to maintain current function and decrease regression in QoL.  OBJECTIVE IMPAIRMENTS: decreased mobility, decreased ROM, decreased strength, hypomobility, and impaired flexibility.   ACTIVITY LIMITATIONS: carrying and lifting  PARTICIPATION LIMITATIONS: community activity  PERSONAL FACTORS: Age, Past/current experiences, Time since onset of injury/illness/exacerbation, and 3+ comorbidities: scleroderma, osteoporosis, hx of cancer are also affecting patient's functional outcome.   REHAB POTENTIAL: Fair multi- comorbidities  CLINICAL DECISION MAKING: Evolving/moderate complexity  EVALUATION COMPLEXITY: Moderate   GOALS: Goals reviewed with patient? Yes  SHORT TERM GOALS: Target date: 12/01/23  Pt will be independent with HEP to improve cervical and shoulder strength with functional activities   Baseline: 06/24/23: HEP given to pt; 09/02/23: 25% compliance; 11/03/23: 25% compliance; 05/03/2024: < 50% adherence (limited to just AROM) Goal status: ON GOING   LONG TERM GOALS: Target date: 06/28/2024  Pt will improve FOTO to target score to demonstrate clinically significant improvement in functional mobility.   Baseline: 06/24/23: 46/58; 09/02/23: 47/58; 11/03/23: 50/58 Goal status: DEFERRED  2.  Pt will improve all R/L shoulder MMT 4+ to improve functional mobility of bilat UE's.  Baseline: 06/24/23; 09/02/23; 11/03/23; MMT Right eval Left eval Left  09/02/23 Right 09/02/23 Right  11/03/23 Left 11/03/23 Right  04/06/24 Left  04/06/24 R/L 05/03/24 R/L  06/30/2024  Shoulder flexion 4 4- 4- 4- 4 4+ 4- 4 4/5 4/5  Shoulder extension            Shoulder abduction 4+ 4+ 5 4+ 4+ 5 5 5  4-/4+ 4-/4+  Shoulder adduction            Shoulder extension            Shoulder internal rotation 4 4 4+ 4+ 4 5 4 5  5/5 4/4  Shoulder external rotation 4 4 4+ 4+ 4 5  4 5  5/5 4/4           Goal status: ONGOING   3.  Pt will improve all cervical AROM to [facility] to improve functional mobility.  Baseline: 06/24/23:  Active ROM A/PROM (deg) eval A/PROM (Deg)  09/02/23 A/PROM (Deg)  11/03/23 A/PROM (Deg)  04/06/24 A/PROM  (Deg) 05/03/2024 A/PROM  (Deg) 06/30/2024  Flexion 45 deg 45 45 35 35 35  Extension 10 deg 10 10 15  < 10 <10  Right lateral flexion 40 deg 20 40 30 30 38  Left lateral flexion 35 deg* 35 40 20 20 30   Right rotation 40 deg 35 40 40 35 35  Left rotation 25 deg* 25 30 25 30 25    Goal status: ONGOING  PLAN:  PT FREQUENCY: 1x/week  PT DURATION:  12 weeks (bi weekly schedule)   PLANNED INTERVENTIONS: Therapeutic exercises, Therapeutic activity, Neuromuscular re-education, Balance training, Gait training, Patient/Family education, Self Care, Joint mobilization, Spinal manipulation, Spinal mobilization, Cryotherapy, Moist heat, Manual therapy, and Re-evaluation  PLAN FOR NEXT SESSION: Cervical manual therapy/stretching. Progress postural strengthening exercises. Overhead head motions/strengthening. Compound movement patterns for cervicothoracic mobility and shoulder strength.  Lonni Pall PT, DPT Physical Therapist- [facility] 09/01/24, 2:42 PM

## 2024-09-08 ENCOUNTER — Ambulatory Visit

## 2024-09-08 DIAGNOSIS — M25611 Stiffness of right shoulder, not elsewhere classified: Secondary | ICD-10-CM

## 2024-09-08 DIAGNOSIS — M542 Cervicalgia: Secondary | ICD-10-CM | POA: Diagnosis not present

## 2024-09-08 DIAGNOSIS — M6281 Muscle weakness (generalized): Secondary | ICD-10-CM

## 2024-09-08 NOTE — Therapy (Signed)
 OUTPATIENT PHYSICAL THERAPY CERVICAL TREATMENT   Patient Name: Whitney Mclean MRN: [id number] DOB:[birth year], 58 y.o., female Today's Date: 09/08/2024  END OF SESSION:  PT End of Session - 09/08/24 1521     Visit Number 37    Number of Visits 50    Date for Recertification  09/22/24    Authorization Type 2025 - Calendar VL 30    Authorization Time Period 09/24/23-09/22/24    Authorization - Number of Visits 30    Progress Note Due on Visit 40    PT Start Time 1516    PT Stop Time 1600    PT Time Calculation (min) 44 min    Activity Tolerance Patient tolerated treatment well    Behavior During Therapy WFL for tasks assessed/performed                Past Medical History:  Diagnosis Date   Hypertension    Oral cancer (HCC)    Personal history of chemotherapy    Personal history of radiation therapy    Scleroderma (HCC)    Past Surgical History:  Procedure Laterality Date   ABDOMINAL HYSTERECTOMY     Patient Active Problem List   Diagnosis Date Noted   Other secondary kyphosis, cervicothoracic region 05/19/2023   Compression fracture of T3 vertebra (HCC) 05/19/2023   Compression fracture of C7 vertebra (HCC) 05/19/2023   Osteoporosis 05/19/2023    PCP: Oneil PHEBE Pinal, MD  REFERRING PROVIDER: Oneil PHEBE Pinal, MD   REFERRING DIAG: S12.600A (ICD-10-CM) - Unspecified displaced fracture of seventh cervical vertebra, initial encounter for closed fracture  THERAPY DIAG:  Cervicalgia  Muscle weakness (generalized)  Stiffness of right shoulder, not elsewhere classified  Rationale for Evaluation and Treatment: Rehabilitation  ONSET DATE: July 2024  SUBJECTIVE:                                                                                                                                                                                                         SUBJECTIVE STATEMENT: 05/31/24: Patient without pain in her neck. She has been adherent to HEP and  utilizing light dumbbells. No questions or concerns.   PERTINENT HISTORY:  Pt presents to PT w/ chronic limited cervical mobility 2/2 severe osteoporosis, scleroderma, and hx of cervical fibrosis of the neck. Since previous bout of PT patient had a fall in the restroom resulting in three fx vertebrae, fx of six ribs, and a fx of her R shoulder blade. Pt's current fx are being managed non-surgically. Pt reports no pain or sx of N/T. She states  she has the most difficulty with maintaining a seated position with no back support and is only able to maintain this position for 2-3 minutes before requiring back support. Pt also reports an increased fear of falling.   PAIN:  Are you having pain? No Main aggravation cannot sit up without back support, increased fear of falling.  Easing factors: having a supported back 2-3 minutes of sitting unsupported before needing support  PRECAUTIONS: Fall  RED FLAGS: None     WEIGHT BEARING RESTRICTIONS: No  FALLS:  Has patient fallen in last 6 months? Yes. Number of falls 1 and near falls  LIVING ENVIRONMENT: Has following equipment at home: Walker - 2 wheeled *Pt does not use RW often  OCCUPATION: Retired, after scleroderma dx  PLOF: Independent  PATIENT GOALS: To get moving again   NEXT MD VISIT: N/A  OBJECTIVE:  Note: Objective measures were completed at Evaluation unless otherwise noted.  DIAGNOSTIC FINDINGS:  CLINICAL DATA:  The patient suffered cervical and thoracic spine compression fractures in a fall 03/25/2023. Subsequent encounter.   EXAM: MRI CERVICAL SPINE WITHOUT CONTRAST   TECHNIQUE: Multiplanar, multisequence MR imaging of the cervical spine was performed. No intravenous contrast was administered.   COMPARISON:  CT chest 04/16/2023.   FINDINGS: Alignment: No listhesis. Mild kyphosis is centered about the T1 level.   Vertebrae: The patient has a superior endplate compression fracture of C7 with vertebral body height  loss of approximately 30%, a biconcave compression fracture of T1 vertebral body height loss of approximately 50% and a biconcave compression fracture T3 with vertebral body height loss centrally of approximately 80%. There is marrow edema within each of the vertebral bodies consistent with acute or early subacute injuries. Marrow edema is also seen in the right fifth and sixth ribs due to the fractures seen on the prior CT.   Cord: Normal signal throughout the visualized cord.   Posterior Fossa, vertebral arteries, paraspinal tissues: Negative.   IMPRESSION: 1. Acute or early subacute compression fractures of C7, T1 and T3 as described above. No retropulsion off the superior endplates of C7 or T1. The central spinal canal and neural foramina are open at all levels. 2. Marrow edema in the right fifth and sixth ribs due to the fractures seen on the prior CT.  PATIENT SURVEYS:  FOTO 48/57  COGNITION: Overall cognitive status: Within functional limits for tasks assessed  SENSATION: WFL  POSTURE: rounded shoulders and forward head  PALPATION: No TTP noted    CERVICAL ROM:   Active ROM A/PROM (deg) eval  Flexion 45 deg  Extension 10 deg  Right lateral flexion 40 deg  Left lateral flexion 35 deg*  Right rotation 40 deg  Left rotation 25 deg*   (Blank rows = not tested)  All cervical PROM= AROM   UPPER EXTREMITY ROM:  Active ROM Right eval Left eval  Shoulder flexion    Shoulder extension    Shoulder abduction 100 deg   Shoulder adduction    Shoulder extension    Shoulder internal rotation C7 C7  Shoulder external rotation Mid- thoracic Mid- thoracic  Elbow flexion    Elbow extension    Wrist flexion    Wrist extension    Wrist ulnar deviation    Wrist radial deviation    Wrist pronation    Wrist supination     (Blank rows = not tested)  UPPER EXTREMITY MMT:  MMT Right eval Left eval  Shoulder flexion 4 4-  Shoulder extension  Shoulder  abduction 4+ 4+  Shoulder adduction    Shoulder extension    Shoulder internal rotation 4 4  Shoulder external rotation 4 4  Middle trapezius    Lower trapezius    Elbow flexion 4+ 4+  Elbow extension 4+ 4+  Wrist flexion    Wrist extension    Wrist ulnar deviation    Wrist radial deviation    Wrist pronation    Wrist supination    Grip strength 4 4   (Blank rows = not tested)  CERVICAL SPECIAL TESTS:  Deferred 2/2 to limited cervical mobility  FUNCTIONAL TESTS:  DNF Endurance Test: Deferred to next session   SLS assessment RLE- 16 seconds LLE- 27 seconds w/ bilat UE support 07/08/23  TODAY'S TREATMENT: DATE: 09/08/2024     Subjective: Patient reports increased stiffness in the upper neck, she contributes current stiffness to the change in weather. Patient encouraged to continue to PT.  No further and questions.   Therapeutic Activity (with intent to improve lifting, carrying tasks):  UBE x 5 min (2.5 min fwd/retro dir) x level 12-6 for UE muscle strength and endurance; PT manually adjusted resistance to patient tolerance.     Seated Arm Circles    1 x 10 x 2# AW on wrists CW/CCW ea dir  1 x 10 x 2# AW on wrists CW/CCW ea dir     Standing Wall Slides with Pillow Case    1 x 15 reps    1 x 15 reps   Seated Shoulder Abd.  3 x 10 - 2# DB   Hooklying Chest Press   2 x 10 - 8# weighted dowel   Manual Therapy (20 min unbilled):  Moderate STM applied to R paracervical, upper trapezius musculature for pain modulation and decrease muscle tension. Myofascial release, trigger point and contract and relax techniques utilized. Pt endorsed improvements in pain following intervention.    Supine Cervical Stretches:  L Rotation - 4 x 1 min bout   L Sidebending - 4 x 1 min bout   Cervical Extension - 2 x 1 min bout    R Side Bending - 4 x 1 min   Minimal improvements with L rotation and sidebending. Passive end feel with hard block due to myofascial restrictions on R side  of neck.    PATIENT EDUCATION:  Education details: Exercise Technique   Person educated: Patient Education method: Medical Illustrator Education comprehension: verbalized understanding and returned demonstration  HOME EXERCISE PROGRAM: Access Code: [email or web address] URL: [email or web address] Date: 05/17/2024 Prepared by: Lonni Pall  Exercises - Supine Chin Tuck  - 1 x daily - 4-5 x weekly - 2 sets - 10 reps - Cervical Extension AROM with Strap  - 1 x daily - 4-5 x weekly - 2 sets - 10 reps - Seated Levator Scapulae Stretch  - 1 x daily - 4-5 x weekly - 1 sets - 2 reps - 30 hold - Wall Quarter Squat  - 1 x daily - 3-4 x weekly - 2-3 sets - 15 hold - Standard Plank  - 1 x daily - 3-4 x weekly - 2-3 sets - 15 hold - Scapular Retraction with Resistance  - 1 x daily - 7 x weekly - 2-3 sets - 10-12 reps - Shoulder External Rotation and Scapular Retraction with Resistance  - 1 x daily - 7 x weekly - 2-3 sets - 10-12 reps - Arm Circles on Swiss Ball  - 1 x daily - 7 x weekly -  2-3 sets - 10 reps - Standing Single Arm Shoulder Abduction with Dumbbell - Palm Down  - 1 x daily - 7 x weekly - 2-3 sets - 8-10 reps  Access Code: [email or web address] URL: [email or web address] Date: 01/19/2024 Prepared by: Lonni Pall  Exercises - Supine Chin Tuck  - 1 x daily - 4-5 x weekly - 2 sets - 10 reps - Cervical Extension AROM with Strap  - 1 x daily - 4-5 x weekly - 2 sets - 10 reps - Seated Levator Scapulae Stretch  - 1 x daily - 4-5 x weekly - 1 sets - 2 reps - 30 hold - Seated scapular retraction   - 1 x daily - 4-5 x weekly - 2 sets - 10 reps - Standing Tandem Balance with Counter Support  - 1 x daily - 3-4 x weekly - 1 sets - 3 reps - 30 hold - Standing Single Leg Stance with Counter Support  - 1 x daily - 3-4 x weekly - 1 sets - 3 reps - 30 hold - Wall Quarter Squat  - 1 x daily - 3-4 x weekly - 2-3 sets - 15 hold - Standard Plank  - 1 x daily - 3-4 x weekly - 2-3 sets  - 15 hold - Scapular Retraction with Resistance  - 1 x daily - 7 x weekly - 2-3 sets - 10-12 reps - Shoulder External Rotation and Scapular Retraction with Resistance  - 1 x daily - 7 x weekly - 2-3 sets - 10-12 reps  ASSESSMENT:  CLINICAL IMPRESSION:    Continued PT POC focus on addressing cervicothoracic and scapular mobility deficits. PT interventions focused on functional strength for OH reaching and UE strength for lifting. Patient tolerated all PT interventions without exacerbation of UE neck pain. She still presents with severe UE weakness secondary to poor adherence to HEP and decreased cervicothoracic mobility. Interventions in seated positions due to severe muscle fatigue in standing. She still continues to present with significant limitations in cervical ROM secondary to myofascial restrictions/chronic conditions. Manual techniques utilized in order to preserve current cervical ROM for driving and scanning. Without PT interventions patient's functional status has potentional to regress leading to further injury and poor QoL. She will continue to benefit from skilled PT in order to maintain current function and decrease regression in QoL.  OBJECTIVE IMPAIRMENTS: decreased mobility, decreased ROM, decreased strength, hypomobility, and impaired flexibility.   ACTIVITY LIMITATIONS: carrying and lifting  PARTICIPATION LIMITATIONS: community activity  PERSONAL FACTORS: Age, Past/current experiences, Time since onset of injury/illness/exacerbation, and 3+ comorbidities: scleroderma, osteoporosis, hx of cancer are also affecting patient's functional outcome.   REHAB POTENTIAL: Fair multi- comorbidities  CLINICAL DECISION MAKING: Evolving/moderate complexity  EVALUATION COMPLEXITY: Moderate   GOALS: Goals reviewed with patient? Yes  SHORT TERM GOALS: Target date: 12/01/23  Pt will be independent with HEP to improve cervical and shoulder strength with functional activities   Baseline:  06/24/23: HEP given to pt; 09/02/23: 25% compliance; 11/03/23: 25% compliance; 05/03/2024: < 50% adherence (limited to just AROM) Goal status: ON GOING   LONG TERM GOALS: Target date: 06/28/2024  Pt will improve FOTO to target score to demonstrate clinically significant improvement in functional mobility.   Baseline: 06/24/23: 46/58; 09/02/23: 47/58; 11/03/23: 50/58 Goal status: DEFERRED  2.  Pt will improve all R/L shoulder MMT 4+ to improve functional mobility of bilat UE's.  Baseline: 06/24/23; 09/02/23; 11/03/23; MMT Right eval Left eval Left  09/02/23 Right 09/02/23 Right  11/03/23 Left 11/03/23 Right  04/06/24 Left  04/06/24 R/L 05/03/24 R/L  06/30/2024  Shoulder flexion 4 4- 4- 4- 4 4+ 4- 4 4/5 4/5  Shoulder extension            Shoulder abduction 4+ 4+ 5 4+ 4+ 5 5 5  4-/4+ 4-/4+  Shoulder adduction            Shoulder extension            Shoulder internal rotation 4 4 4+ 4+ 4 5 4 5  5/5 4/4  Shoulder external rotation 4 4 4+ 4+ 4 5 4 5  5/5 4/4           Goal status: ONGOING   3.  Pt will improve all cervical AROM to [facility] to improve functional mobility.  Baseline: 06/24/23:  Active ROM A/PROM (deg) eval A/PROM (Deg)  09/02/23 A/PROM (Deg)  11/03/23 A/PROM (Deg)  04/06/24 A/PROM  (Deg) 05/03/2024 A/PROM  (Deg) 06/30/2024  Flexion 45 deg 45 45 35 35 35  Extension 10 deg 10 10 15  < 10 <10  Right lateral flexion 40 deg 20 40 30 30 38  Left lateral flexion 35 deg* 35 40 20 20 30   Right rotation 40 deg 35 40 40 35 35  Left rotation 25 deg* 25 30 25 30 25    Goal status: ONGOING  PLAN:  PT FREQUENCY: 1x/week  PT DURATION:  12 weeks (bi weekly schedule)   PLANNED INTERVENTIONS: Therapeutic exercises, Therapeutic activity, Neuromuscular re-education, Balance training, Gait training, Patient/Family education, Self Care, Joint mobilization, Spinal manipulation, Spinal mobilization, Cryotherapy, Moist heat, Manual therapy, and Re-evaluation  PLAN FOR NEXT SESSION:  Cervical manual therapy/stretching. Progress postural strengthening exercises. Overhead head motions/strengthening. Compound movement patterns for cervicothoracic mobility and shoulder strength.  Lonni Pall PT, DPT Physical Therapist- [facility] 09/08/2024, 4:06 PM

## 2024-09-14 ENCOUNTER — Ambulatory Visit

## 2024-09-15 ENCOUNTER — Ambulatory Visit

## 2024-09-15 DIAGNOSIS — M542 Cervicalgia: Secondary | ICD-10-CM | POA: Diagnosis not present

## 2024-09-15 DIAGNOSIS — M6281 Muscle weakness (generalized): Secondary | ICD-10-CM

## 2024-09-15 DIAGNOSIS — M25611 Stiffness of right shoulder, not elsewhere classified: Secondary | ICD-10-CM

## 2024-09-15 NOTE — Therapy (Signed)
 " OUTPATIENT PHYSICAL THERAPY CERVICAL TREATMENT   Patient Name: Whitney Mclean MRN: [id number] DOB:07-26-1966, 58 y.o., female Today's Date: 09/15/2024  END OF SESSION:  PT End of Session - 09/15/24 0911     Visit Number 38    Number of Visits 50    Date for Recertification  09/22/24    Authorization Type 2025 - Calendar VL 30    Authorization Time Period 09/24/23-09/22/24    Authorization - Visit Number 27    Authorization - Number of Visits 30    Progress Note Due on Visit 40    PT Start Time 0900    PT Stop Time 0940    PT Time Calculation (min) 40 min    Activity Tolerance Patient tolerated treatment well    Behavior During Therapy WFL for tasks assessed/performed                Past Medical History:  Diagnosis Date   Hypertension    Oral cancer (HCC)    Personal history of chemotherapy    Personal history of radiation therapy    Scleroderma (HCC)    Past Surgical History:  Procedure Laterality Date   ABDOMINAL HYSTERECTOMY     Patient Active Problem List   Diagnosis Date Noted   Other secondary kyphosis, cervicothoracic region 05/19/2023   Compression fracture of T3 vertebra (HCC) 05/19/2023   Compression fracture of C7 vertebra (HCC) 05/19/2023   Osteoporosis 05/19/2023    PCP: Oneil PHEBE Pinal, MD  REFERRING PROVIDER: Oneil PHEBE Pinal, MD   REFERRING DIAG: S12.600A (ICD-10-CM) - Unspecified displaced fracture of seventh cervical vertebra, initial encounter for closed fracture  THERAPY DIAG:  Cervicalgia  Muscle weakness (generalized)  Stiffness of right shoulder, not elsewhere classified  Rationale for Evaluation and Treatment: Rehabilitation  ONSET DATE: July 2024  SUBJECTIVE:                                                                                                                                                                                                         SUBJECTIVE STATEMENT: 05/31/24: Patient without pain in her  neck. She has been adherent to HEP and utilizing light dumbbells. No questions or concerns.   PERTINENT HISTORY:  Pt presents to PT w/ chronic limited cervical mobility 2/2 severe osteoporosis, scleroderma, and hx of cervical fibrosis of the neck. Since previous bout of PT patient had a fall in the restroom resulting in three fx vertebrae, fx of six ribs, and a fx of her R shoulder blade. Pt's current fx are being managed non-surgically. Pt  reports no pain or sx of N/T. She states she has the most difficulty with maintaining a seated position with no back support and is only able to maintain this position for 2-3 minutes before requiring back support. Pt also reports an increased fear of falling.   PAIN:  Are you having pain? No Main aggravation cannot sit up without back support, increased fear of falling.  Easing factors: having a supported back 2-3 minutes of sitting unsupported before needing support  PRECAUTIONS: Fall  RED FLAGS: None     WEIGHT BEARING RESTRICTIONS: No  FALLS:  Has patient fallen in last 6 months? Yes. Number of falls 1 and near falls  LIVING ENVIRONMENT: Has following equipment at home: Walker - 2 wheeled *Pt does not use RW often  OCCUPATION: Retired, after scleroderma dx  PLOF: Independent  PATIENT GOALS: To get moving again   NEXT MD VISIT: N/A  OBJECTIVE:  Note: Objective measures were completed at Evaluation unless otherwise noted.  DIAGNOSTIC FINDINGS:  CLINICAL DATA:  The patient suffered cervical and thoracic spine compression fractures in a fall 03/25/2023. Subsequent encounter.   EXAM: MRI CERVICAL SPINE WITHOUT CONTRAST   TECHNIQUE: Multiplanar, multisequence MR imaging of the cervical spine was performed. No intravenous contrast was administered.   COMPARISON:  CT chest 04/16/2023.   FINDINGS: Alignment: No listhesis. Mild kyphosis is centered about the T1 level.   Vertebrae: The patient has a superior endplate compression  fracture of C7 with vertebral body height loss of approximately 30%, a biconcave compression fracture of T1 vertebral body height loss of approximately 50% and a biconcave compression fracture T3 with vertebral body height loss centrally of approximately 80%. There is marrow edema within each of the vertebral bodies consistent with acute or early subacute injuries. Marrow edema is also seen in the right fifth and sixth ribs due to the fractures seen on the prior CT.   Cord: Normal signal throughout the visualized cord.   Posterior Fossa, vertebral arteries, paraspinal tissues: Negative.   IMPRESSION: 1. Acute or early subacute compression fractures of C7, T1 and T3 as described above. No retropulsion off the superior endplates of C7 or T1. The central spinal canal and neural foramina are open at all levels. 2. Marrow edema in the right fifth and sixth ribs due to the fractures seen on the prior CT.  PATIENT SURVEYS:  FOTO 48/57  COGNITION: Overall cognitive status: Within functional limits for tasks assessed  SENSATION: WFL  POSTURE: rounded shoulders and forward head  PALPATION: No TTP noted    CERVICAL ROM:   Active ROM A/PROM (deg) eval  Flexion 45 deg  Extension 10 deg  Right lateral flexion 40 deg  Left lateral flexion 35 deg*  Right rotation 40 deg  Left rotation 25 deg*   (Blank rows = not tested)  All cervical PROM= AROM   UPPER EXTREMITY ROM:  Active ROM Right eval Left eval  Shoulder flexion    Shoulder extension    Shoulder abduction 100 deg   Shoulder adduction    Shoulder extension    Shoulder internal rotation C7 C7  Shoulder external rotation Mid- thoracic Mid- thoracic  Elbow flexion    Elbow extension    Wrist flexion    Wrist extension    Wrist ulnar deviation    Wrist radial deviation    Wrist pronation    Wrist supination     (Blank rows = not tested)  UPPER EXTREMITY MMT:  MMT Right eval Left eval  Shoulder flexion 4 4-   Shoulder extension    Shoulder abduction 4+ 4+  Shoulder adduction    Shoulder extension    Shoulder internal rotation 4 4  Shoulder external rotation 4 4  Middle trapezius    Lower trapezius    Elbow flexion 4+ 4+  Elbow extension 4+ 4+  Wrist flexion    Wrist extension    Wrist ulnar deviation    Wrist radial deviation    Wrist pronation    Wrist supination    Grip strength 4 4   (Blank rows = not tested)  CERVICAL SPECIAL TESTS:  Deferred 2/2 to limited cervical mobility  FUNCTIONAL TESTS:  DNF Endurance Test: Deferred to next session   SLS assessment RLE- 16 seconds LLE- 27 seconds w/ bilat UE support 07/08/23  TODAY'S TREATMENT: DATE: 09/15/2024     Subjective: Patient reports continued stiffness in her neck. She reports that she has tried some HEP exercises at home with 1# DB. No further and questions.   Therapeutic Activity (with intent to improve lifting, carrying tasks):  UBE x 5 min (2.5 min fwd/retro dir) x level 12-6 for UE muscle strength and endurance; PT manually adjusted resistance to patient tolerance.     Seated Arm Circles    2 x 10 x 2# AW on wrists CW/CCW ea dir  2 x 10 x 2# AW on wrists CW/CCW ea dir    TRX Shoulder Row   3 x 10 - SB    Standing Foam Roller OH reach on Wall    2 x 10 - 2# AW Around wrists    Hooklying Chest Press   1 x 10 - 8# weighted dowel  Hooklying OH Reach with Weighted Dowel  2 x 10 - 8#     Manual Therapy (10 min unbilled):  Light STM applied to R paracervical, upper trapezius musculature for pain modulation and decrease muscle tension. Myofascial release, trigger point and contract and relax techniques utilized. Pt endorsed improvements in pain following intervention.   Supine Cervical Stretches:   L Sidebending - 2 x 1 min bout   Cervical Extension - 2 x 1 min bout  PATIENT EDUCATION:  Education details: Exercise Technique   Person educated: Patient Education method: Explanation and  Demonstration Education comprehension: verbalized understanding and returned demonstration  HOME EXERCISE PROGRAM: Access Code: [email or web address] URL: [email or web address] Date: 05/17/2024 Prepared by: Lonni Pall  Exercises - Supine Chin Tuck  - 1 x daily - 4-5 x weekly - 2 sets - 10 reps - Cervical Extension AROM with Strap  - 1 x daily - 4-5 x weekly - 2 sets - 10 reps - Seated Levator Scapulae Stretch  - 1 x daily - 4-5 x weekly - 1 sets - 2 reps - 30 hold - Wall Quarter Squat  - 1 x daily - 3-4 x weekly - 2-3 sets - 15 hold - Standard Plank  - 1 x daily - 3-4 x weekly - 2-3 sets - 15 hold - Scapular Retraction with Resistance  - 1 x daily - 7 x weekly - 2-3 sets - 10-12 reps - Shoulder External Rotation and Scapular Retraction with Resistance  - 1 x daily - 7 x weekly - 2-3 sets - 10-12 reps - Arm Circles on Swiss Ball  - 1 x daily - 7 x weekly - 2-3 sets - 10 reps - Standing Single Arm Shoulder Abduction with Dumbbell - Palm Down  - 1 x daily - 7 x weekly -  2-3 sets - 8-10 reps  Access Code: [email or web address] URL: [email or web address] Date: 01/19/2024 Prepared by: Lonni Pall  Exercises - Supine Chin Tuck  - 1 x daily - 4-5 x weekly - 2 sets - 10 reps - Cervical Extension AROM with Strap  - 1 x daily - 4-5 x weekly - 2 sets - 10 reps - Seated Levator Scapulae Stretch  - 1 x daily - 4-5 x weekly - 1 sets - 2 reps - 30 hold - Seated scapular retraction   - 1 x daily - 4-5 x weekly - 2 sets - 10 reps - Standing Tandem Balance with Counter Support  - 1 x daily - 3-4 x weekly - 1 sets - 3 reps - 30 hold - Standing Single Leg Stance with Counter Support  - 1 x daily - 3-4 x weekly - 1 sets - 3 reps - 30 hold - Wall Quarter Squat  - 1 x daily - 3-4 x weekly - 2-3 sets - 15 hold - Standard Plank  - 1 x daily - 3-4 x weekly - 2-3 sets - 15 hold - Scapular Retraction with Resistance  - 1 x daily - 7 x weekly - 2-3 sets - 10-12 reps - Shoulder External Rotation and  Scapular Retraction with Resistance  - 1 x daily - 7 x weekly - 2-3 sets - 10-12 reps  ASSESSMENT:  CLINICAL IMPRESSION:    Continued PT POC focus on addressing cervicothoracic and scapular mobility deficits. PT interventions focused on functional strength for OH reaching and UE strength for lifting. Patient tolerated all PT interventions without exacerbation of UE neck pain. Good demonstration of shoulder stability and scapular retraction with [place] exercise utlizing TRX ropes. Continued global strengthening of deltoid muscle group for improved lifting. Time spent with manual techniques to prevent progress of myofascial restrictions located around R neck. She still presents with severe UE weakness secondary to poor adherence to HEP and decreased cervicothoracic mobility. She still continues to present with significant limitations in cervical ROM secondary to myofascial restrictions/chronic conditions. Manual techniques utilized in order to preserve current cervical ROM for driving and scanning. Without PT interventions patient's functional status has potentional to regress leading to further injury and poor QoL. She will continue to benefit from skilled PT in order to maintain current function and decrease regression in QoL.  OBJECTIVE IMPAIRMENTS: decreased mobility, decreased ROM, decreased strength, hypomobility, and impaired flexibility.   ACTIVITY LIMITATIONS: carrying and lifting  PARTICIPATION LIMITATIONS: community activity  PERSONAL FACTORS: Age, Past/current experiences, Time since onset of injury/illness/exacerbation, and 3+ comorbidities: scleroderma, osteoporosis, hx of cancer are also affecting patient's functional outcome.   REHAB POTENTIAL: Fair multi- comorbidities  CLINICAL DECISION MAKING: Evolving/moderate complexity  EVALUATION COMPLEXITY: Moderate   GOALS: Goals reviewed with patient? Yes  SHORT TERM GOALS: Target date: 12/01/23  Pt will be independent with HEP to  improve cervical and shoulder strength with functional activities   Baseline: 06/24/23: HEP given to pt; 09/02/23: 25% compliance; 11/03/23: 25% compliance; 05/03/2024: < 50% adherence (limited to just AROM) Goal status: ON GOING   LONG TERM GOALS: Target date: 06/28/2024  Pt will improve FOTO to target score to demonstrate clinically significant improvement in functional mobility.   Baseline: 06/24/23: 46/58; 09/02/23: 47/58; 11/03/23: 50/58 Goal status: DEFERRED  2.  Pt will improve all R/L shoulder MMT 4+ to improve functional mobility of bilat UE's.  Baseline: 06/24/23; 09/02/23; 11/03/23; MMT Right eval Left eval Left  09/02/23 Right 09/02/23 Right  11/03/23 Left 11/03/23  Right  04/06/24 Left  04/06/24 R/L 05/03/24 R/L  06/30/2024  Shoulder flexion 4 4- 4- 4- 4 4+ 4- 4 4/5 4/5  Shoulder extension            Shoulder abduction 4+ 4+ 5 4+ 4+ 5 5 5  4-/4+ 4-/4+  Shoulder adduction            Shoulder extension            Shoulder internal rotation 4 4 4+ 4+ 4 5 4 5  5/5 4/4  Shoulder external rotation 4 4 4+ 4+ 4 5 4 5  5/5 4/4           Goal status: ONGOING   3.  Pt will improve all cervical AROM to [facility] to improve functional mobility.  Baseline: 06/24/23:  Active ROM A/PROM (deg) eval A/PROM (Deg)  09/02/23 A/PROM (Deg)  11/03/23 A/PROM (Deg)  04/06/24 A/PROM  (Deg) 05/03/2024 A/PROM  (Deg) 06/30/2024  Flexion 45 deg 45 45 35 35 35  Extension 10 deg 10 10 15  < 10 <10  Right lateral flexion 40 deg 20 40 30 30 38  Left lateral flexion 35 deg* 35 40 20 20 30   Right rotation 40 deg 35 40 40 35 35  Left rotation 25 deg* 25 30 25 30 25    Goal status: ONGOING  PLAN:  PT FREQUENCY: 1x/week  PT DURATION:  12 weeks (bi weekly schedule)   PLANNED INTERVENTIONS: Therapeutic exercises, Therapeutic activity, Neuromuscular re-education, Balance training, Gait training, Patient/Family education, Self Care, Joint mobilization, Spinal manipulation, Spinal mobilization, Cryotherapy,  Moist heat, Manual therapy, and Re-evaluation  PLAN FOR NEXT SESSION: Cervical manual therapy/stretching. Progress postural strengthening exercises. Overhead head motions/strengthening. Compound movement patterns for cervicothoracic mobility and shoulder strength.  Lonni Pall PT, DPT Physical Therapist- [facility] 09/15/2024, 10:57 AM  "

## 2024-09-28 ENCOUNTER — Ambulatory Visit: Attending: Internal Medicine

## 2024-09-28 DIAGNOSIS — M542 Cervicalgia: Secondary | ICD-10-CM | POA: Diagnosis present

## 2024-09-28 DIAGNOSIS — M25611 Stiffness of right shoulder, not elsewhere classified: Secondary | ICD-10-CM | POA: Diagnosis present

## 2024-09-28 DIAGNOSIS — M6281 Muscle weakness (generalized): Secondary | ICD-10-CM | POA: Diagnosis present

## 2024-09-28 NOTE — Therapy (Signed)
 " OUTPATIENT PHYSICAL THERAPY CERVICAL TREATMENT/RECERTIFICATION   Patient Name: Whitney Mclean MRN: [id number] DOB:1966/02/06, 59 y.o., female Today's Date: 09/28/2024  END OF SESSION:  PT End of Session - 09/28/24 1521     Visit Number 39    Number of Visits 50    Date for Recertification  11/23/24    Authorization Type VL: 30 visits PT/OT per cal yr    Authorization Time Period --    Authorization - Visit Number 1    Authorization - Number of Visits 30    Progress Note Due on Visit 40    PT Start Time 1518    PT Stop Time 1600    PT Time Calculation (min) 42 min    Activity Tolerance Patient tolerated treatment well    Behavior During Therapy WFL for tasks assessed/performed          Past Medical History:  Diagnosis Date   Hypertension    Oral cancer (HCC)    Personal history of chemotherapy    Personal history of radiation therapy    Scleroderma (HCC)    Past Surgical History:  Procedure Laterality Date   ABDOMINAL HYSTERECTOMY     Patient Active Problem List   Diagnosis Date Noted   Other secondary kyphosis, cervicothoracic region 05/19/2023   Compression fracture of T3 vertebra (HCC) 05/19/2023   Compression fracture of C7 vertebra (HCC) 05/19/2023   Osteoporosis 05/19/2023    PCP: Oneil PHEBE Pinal, MD  REFERRING PROVIDER: Oneil PHEBE Pinal, MD   REFERRING DIAG: S12.600A (ICD-10-CM) - Unspecified displaced fracture of seventh cervical vertebra, initial encounter for closed fracture  THERAPY DIAG:  Cervicalgia  Muscle weakness (generalized)  Stiffness of right shoulder, not elsewhere classified  Rationale for Evaluation and Treatment: Rehabilitation  ONSET DATE: July 2024  SUBJECTIVE:                                                                                                                                                                                                         SUBJECTIVE STATEMENT: 05/31/24: Patient without pain in her neck. She  has been adherent to HEP and utilizing light dumbbells. No questions or concerns.   PERTINENT HISTORY:  Pt presents to PT w/ chronic limited cervical mobility 2/2 severe osteoporosis, scleroderma, and hx of cervical fibrosis of the neck. Since previous bout of PT patient had a fall in the restroom resulting in three fx vertebrae, fx of six ribs, and a fx of her R shoulder blade. Pt's current fx are being managed non-surgically. Pt reports no pain or  sx of N/T. She states she has the most difficulty with maintaining a seated position with no back support and is only able to maintain this position for 2-3 minutes before requiring back support. Pt also reports an increased fear of falling.   PAIN:  Are you having pain? No Main aggravation cannot sit up without back support, increased fear of falling.  Easing factors: having a supported back 2-3 minutes of sitting unsupported before needing support  PRECAUTIONS: Fall  RED FLAGS: None     WEIGHT BEARING RESTRICTIONS: No  FALLS:  Has patient fallen in last 6 months? Yes. Number of falls 1 and near falls  LIVING ENVIRONMENT: Has following equipment at home: Walker - 2 wheeled *Pt does not use RW often  OCCUPATION: Retired, after scleroderma dx  PLOF: Independent  PATIENT GOALS: To get moving again   NEXT MD VISIT: N/A  OBJECTIVE:  Note: Objective measures were completed at Evaluation unless otherwise noted.  DIAGNOSTIC FINDINGS:  CLINICAL DATA:  The patient suffered cervical and thoracic spine compression fractures in a fall 03/25/2023. Subsequent encounter.   EXAM: MRI CERVICAL SPINE WITHOUT CONTRAST   TECHNIQUE: Multiplanar, multisequence MR imaging of the cervical spine was performed. No intravenous contrast was administered.   COMPARISON:  CT chest 04/16/2023.   FINDINGS: Alignment: No listhesis. Mild kyphosis is centered about the T1 level.   Vertebrae: The patient has a superior endplate compression fracture of  C7 with vertebral body height loss of approximately 30%, a biconcave compression fracture of T1 vertebral body height loss of approximately 50% and a biconcave compression fracture T3 with vertebral body height loss centrally of approximately 80%. There is marrow edema within each of the vertebral bodies consistent with acute or early subacute injuries. Marrow edema is also seen in the right fifth and sixth ribs due to the fractures seen on the prior CT.   Cord: Normal signal throughout the visualized cord.   Posterior Fossa, vertebral arteries, paraspinal tissues: Negative.   IMPRESSION: 1. Acute or early subacute compression fractures of C7, T1 and T3 as described above. No retropulsion off the superior endplates of C7 or T1. The central spinal canal and neural foramina are open at all levels. 2. Marrow edema in the right fifth and sixth ribs due to the fractures seen on the prior CT.  PATIENT SURVEYS:  FOTO 48/57  COGNITION: Overall cognitive status: Within functional limits for tasks assessed  SENSATION: WFL  POSTURE: rounded shoulders and forward head  PALPATION: No TTP noted    CERVICAL ROM:   Active ROM A/PROM (deg) eval  Flexion 45 deg  Extension 10 deg  Right lateral flexion 40 deg  Left lateral flexion 35 deg*  Right rotation 40 deg  Left rotation 25 deg*   (Blank rows = not tested)  All cervical PROM= AROM   UPPER EXTREMITY ROM:  Active ROM Right eval Left eval  Shoulder flexion    Shoulder extension    Shoulder abduction 100 deg   Shoulder adduction    Shoulder extension    Shoulder internal rotation C7 C7  Shoulder external rotation Mid- thoracic Mid- thoracic  Elbow flexion    Elbow extension    Wrist flexion    Wrist extension    Wrist ulnar deviation    Wrist radial deviation    Wrist pronation    Wrist supination     (Blank rows = not tested)  UPPER EXTREMITY MMT:  MMT Right eval Left eval  Shoulder flexion 4 4-  Shoulder  extension    Shoulder abduction 4+ 4+  Shoulder adduction    Shoulder extension    Shoulder internal rotation 4 4  Shoulder external rotation 4 4  Middle trapezius    Lower trapezius    Elbow flexion 4+ 4+  Elbow extension 4+ 4+  Wrist flexion    Wrist extension    Wrist ulnar deviation    Wrist radial deviation    Wrist pronation    Wrist supination    Grip strength 4 4   (Blank rows = not tested)  CERVICAL SPECIAL TESTS:  Deferred 2/2 to limited cervical mobility  FUNCTIONAL TESTS:  DNF Endurance Test: Deferred to next session   SLS assessment RLE- 16 seconds LLE- 27 seconds w/ bilat UE support 07/08/23  TODAY'S TREATMENT: DATE: 09/28/2024     Subjective: Patient reports no pain in the upper neck however persistent stiffness due myofacial restrictions. She saw myofacial release specialist this past sat with minor improvements to cervical range following stretch protocol and  manual technique. No further and questions.   Therapeutic Activity (with intent to improve lifting, carrying tasks):   Seated Arm Circles    2 x 20 x 2# AW on wrists CW/CCW ea dir  2 x 20 x 2# AW on wrists CW/CCW ea dir   Seated OH press   3 x 10 - 2# DB    Standing Foam Roller OH reach on Wall    3 x 10    TRX Shoulder Row   3 x 10 - SBA   Manual Therapy (15 min unbilled):  Light STM applied to R paracervical, upper trapezius musculature for pain modulation and decrease muscle tension. Myofascial release, trigger point and contract and relax techniques utilized. Pt endorsed improvements in pain following intervention.   Supine Cervical Stretches:   R/L Cervical Rotation - 2 x 1 min bout per direction    L Sidebending - 2 x 1 min bout    Cervical Extension - 2 x 1 min bout  Physical Performance Measure (2 min unbilled):  MMT (See goals below)   ROM (See goals below)    PATIENT EDUCATION:  Education details: Exercise Technique   Person educated: Patient Education method:  Explanation and Demonstration Education comprehension: verbalized understanding and returned demonstration  HOME EXERCISE PROGRAM: Access Code: [email or web address] URL: [email or web address] Date: 05/17/2024 Prepared by: Lonni Pall  Exercises - Supine Chin Tuck  - 1 x daily - 4-5 x weekly - 2 sets - 10 reps - Cervical Extension AROM with Strap  - 1 x daily - 4-5 x weekly - 2 sets - 10 reps - Seated Levator Scapulae Stretch  - 1 x daily - 4-5 x weekly - 1 sets - 2 reps - 30 hold - Wall Quarter Squat  - 1 x daily - 3-4 x weekly - 2-3 sets - 15 hold - Standard Plank  - 1 x daily - 3-4 x weekly - 2-3 sets - 15 hold - Scapular Retraction with Resistance  - 1 x daily - 7 x weekly - 2-3 sets - 10-12 reps - Shoulder External Rotation and Scapular Retraction with Resistance  - 1 x daily - 7 x weekly - 2-3 sets - 10-12 reps - Arm Circles on Swiss Ball  - 1 x daily - 7 x weekly - 2-3 sets - 10 reps - Standing Single Arm Shoulder Abduction with Dumbbell - Palm Down  - 1 x daily - 7 x weekly - 2-3 sets - 8-10  reps  Access Code: [email or web address] URL: [email or web address] Date: 01/19/2024 Prepared by: Lonni Pall  Exercises - Supine Chin Tuck  - 1 x daily - 4-5 x weekly - 2 sets - 10 reps - Cervical Extension AROM with Strap  - 1 x daily - 4-5 x weekly - 2 sets - 10 reps - Seated Levator Scapulae Stretch  - 1 x daily - 4-5 x weekly - 1 sets - 2 reps - 30 hold - Seated scapular retraction   - 1 x daily - 4-5 x weekly - 2 sets - 10 reps - Standing Tandem Balance with Counter Support  - 1 x daily - 3-4 x weekly - 1 sets - 3 reps - 30 hold - Standing Single Leg Stance with Counter Support  - 1 x daily - 3-4 x weekly - 1 sets - 3 reps - 30 hold - Wall Quarter Squat  - 1 x daily - 3-4 x weekly - 2-3 sets - 15 hold - Standard Plank  - 1 x daily - 3-4 x weekly - 2-3 sets - 15 hold - Scapular Retraction with Resistance  - 1 x daily - 7 x weekly - 2-3 sets - 10-12 reps - Shoulder External  Rotation and Scapular Retraction with Resistance  - 1 x daily - 7 x weekly - 2-3 sets - 10-12 reps  ASSESSMENT:  CLINICAL IMPRESSION:    Continued PT POC focus on addressing cervicothoracic and scapular mobility deficits. Patient still continues to present with severe limitations in cervical rotation, side bending and extension (See goals below). Additionally she still has strength deficits (R > L) in her upper extremities. (See goals below for MMT). Remainder of today's session with a focus on global shoulder strengthening and functional tasks involving OH reaching. Patient only able to tolerate lighter weight and required multiple seated rest breaks due to quick muscular fatigue. She still continues to present with significant limitations in cervical ROM secondary to myofascial restrictions/chronic conditions. Manual techniques utilized in order to preserve current cervical ROM for driving and scanning. Without PT interventions patient's functional status has potentional to regress leading to further injury and poor QoL. PT requesting for additional 1x/week for the next 8 weeks.  She will continue to benefit from skilled PT in order to maintain current function and decrease regression in QoL.  OBJECTIVE IMPAIRMENTS: decreased mobility, decreased ROM, decreased strength, hypomobility, and impaired flexibility.   ACTIVITY LIMITATIONS: carrying and lifting  PARTICIPATION LIMITATIONS: community activity  PERSONAL FACTORS: Age, Past/current experiences, Time since onset of injury/illness/exacerbation, and 3+ comorbidities: scleroderma, osteoporosis, hx of cancer are also affecting patient's functional outcome.   REHAB POTENTIAL: Fair multi- comorbidities  CLINICAL DECISION MAKING: Evolving/moderate complexity  EVALUATION COMPLEXITY: Moderate   GOALS: Goals reviewed with patient? Yes  SHORT TERM GOALS: Target date: 12/01/23  Pt will be independent with HEP to improve cervical and shoulder  strength with functional activities   Baseline: 06/24/23: HEP given to pt; 09/02/23: 25% compliance; 11/03/23: 25% compliance; 05/03/2024: < 50% adherence (limited to just AROM) Goal status: ON GOING   LONG TERM GOALS: Target date: 11/23/2024  Pt will improve FOTO to target score to demonstrate clinically significant improvement in functional mobility.   Baseline: 06/24/23: 46/58; 09/02/23: 47/58; 11/03/23: 50/58 Goal status: DEFERRED  2.  Pt will improve all R/L shoulder MMT 4+ to improve functional mobility of bilat UE's.  Baseline: 06/24/23; 09/02/23; 11/03/23;  MMT Right eval Left eval Left  09/02/23 Right 09/02/23 Right  11/03/23 Left 11/03/23 Right  04/06/24 Left  04/06/24 R/L 05/03/24 R/L  06/30/2024 R/L 09/28/24  Shoulder flexion 4 4- 4- 4- 4 4+ 4- 4 4/5 4/5 4-/4+  Shoulder extension             Shoulder abduction 4+ 4+ 5 4+ 4+ 5 5 5  4-/4+ 4-/4+ 4-/4+  Shoulder adduction             Shoulder extension             Shoulder internal rotation 4 4 4+ 4+ 4 5 4 5  5/5 4/4   Shoulder external rotation 4 4 4+ 4+ 4 5 4 5  5/5 4/4            Goal status: ONGOING   3.  Pt will improve all cervical AROM to [facility] to improve functional mobility.  Baseline: 06/24/23:  Active ROM A/PROM (deg) eval A/PROM (Deg)  09/02/23 A/PROM (Deg)  11/03/23 A/PROM (Deg)  04/06/24 A/PROM  (Deg) 05/03/2024 A/PROM  (Deg) 06/30/2024 AROM (Deg) 09/28/2024  Flexion 45 deg 45 45 35 35 35 30  Extension 10 deg 10 10 15  < 10 <10 < 10   Right lateral flexion 40 deg 20 40 30 30 38 25  Left lateral flexion 35 deg* 35 40 20 20 30 25   Right rotation 40 deg 35 40 40 35 35 30  Left rotation 25 deg* 25 30 25 30 25 28    Goal status: ONGOING  PLAN:  PT FREQUENCY: 1x/week  PT DURATION:  12 weeks (bi weekly schedule)   PLANNED INTERVENTIONS: Therapeutic exercises, Therapeutic activity, Neuromuscular re-education, Balance training, Gait training, Patient/Family education, Self Care, Joint mobilization, Spinal  manipulation, Spinal mobilization, Cryotherapy, Moist heat, Manual therapy, and Re-evaluation  PLAN FOR NEXT SESSION: Cervical manual therapy/stretching. Progress postural strengthening exercises. Overhead head motions/strengthening. Compound movement patterns for cervicothoracic mobility and shoulder strength.  Lonni Pall PT, DPT Physical Therapist- [facility] 09/28/2024, 5:24 PM  "

## 2024-10-05 ENCOUNTER — Ambulatory Visit

## 2024-10-05 DIAGNOSIS — M25611 Stiffness of right shoulder, not elsewhere classified: Secondary | ICD-10-CM

## 2024-10-05 DIAGNOSIS — M542 Cervicalgia: Secondary | ICD-10-CM

## 2024-10-05 DIAGNOSIS — M6281 Muscle weakness (generalized): Secondary | ICD-10-CM

## 2024-10-05 NOTE — Therapy (Signed)
 " OUTPATIENT PHYSICAL THERAPY CERVICAL TREATMENT/PROGRESS NOTE Dates of reporting period  06/30/2024   to   10/05/2024     Patient Name: Whitney Mclean MRN: [id number] DOB:1966-03-06, 59 y.o., female Today's Date: 10/05/2024  END OF SESSION:  PT End of Session - 10/05/24 1125     Visit Number 40    Number of Visits 50    Date for Recertification  11/23/24    Authorization Type VL: 30 visits PT/OT per cal yr    Authorization - Visit Number 2    Authorization - Number of Visits 30    Progress Note Due on Visit 50    PT Start Time 1120    PT Stop Time 1200    PT Time Calculation (min) 40 min    Activity Tolerance Patient tolerated treatment well    Behavior During Therapy WFL for tasks assessed/performed          Past Medical History:  Diagnosis Date   Hypertension    Oral cancer (HCC)    Personal history of chemotherapy    Personal history of radiation therapy    Scleroderma (HCC)    Past Surgical History:  Procedure Laterality Date   ABDOMINAL HYSTERECTOMY     Patient Active Problem List   Diagnosis Date Noted   Other secondary kyphosis, cervicothoracic region 05/19/2023   Compression fracture of T3 vertebra (HCC) 05/19/2023   Compression fracture of C7 vertebra (HCC) 05/19/2023   Osteoporosis 05/19/2023    PCP: Oneil PHEBE Pinal, MD  REFERRING PROVIDER: Oneil PHEBE Pinal, MD   REFERRING DIAG: S12.600A (ICD-10-CM) - Unspecified displaced fracture of seventh cervical vertebra, initial encounter for closed fracture  THERAPY DIAG:  Cervicalgia  Rationale for Evaluation and Treatment: Rehabilitation  ONSET DATE: July 2024  SUBJECTIVE:                                                                                                                                                                                                         SUBJECTIVE STATEMENT: 05/31/24: Patient without pain in her neck. She has been adherent to HEP and utilizing light dumbbells. No  questions or concerns.   PERTINENT HISTORY:  Pt presents to PT w/ chronic limited cervical mobility 2/2 severe osteoporosis, scleroderma, and hx of cervical fibrosis of the neck. Since previous bout of PT patient had a fall in the restroom resulting in three fx vertebrae, fx of six ribs, and a fx of her R shoulder blade. Pt's current fx are being managed non-surgically. Pt reports no pain or sx of N/T. She  states she has the most difficulty with maintaining a seated position with no back support and is only able to maintain this position for 2-3 minutes before requiring back support. Pt also reports an increased fear of falling.   PAIN:  Are you having pain? No Main aggravation cannot sit up without back support, increased fear of falling.  Easing factors: having a supported back 2-3 minutes of sitting unsupported before needing support  PRECAUTIONS: Fall  RED FLAGS: None     WEIGHT BEARING RESTRICTIONS: No  FALLS:  Has patient fallen in last 6 months? Yes. Number of falls 1 and near falls  LIVING ENVIRONMENT: Has following equipment at home: Walker - 2 wheeled *Pt does not use RW often  OCCUPATION: Retired, after scleroderma dx  PLOF: Independent  PATIENT GOALS: To get moving again   NEXT MD VISIT: N/A  OBJECTIVE:  Note: Objective measures were completed at Evaluation unless otherwise noted.  DIAGNOSTIC FINDINGS:  CLINICAL DATA:  The patient suffered cervical and thoracic spine compression fractures in a fall 03/25/2023. Subsequent encounter.   EXAM: MRI CERVICAL SPINE WITHOUT CONTRAST   TECHNIQUE: Multiplanar, multisequence MR imaging of the cervical spine was performed. No intravenous contrast was administered.   COMPARISON:  CT chest 04/16/2023.   FINDINGS: Alignment: No listhesis. Mild kyphosis is centered about the T1 level.   Vertebrae: The patient has a superior endplate compression fracture of C7 with vertebral body height loss of approximately 30%,  a biconcave compression fracture of T1 vertebral body height loss of approximately 50% and a biconcave compression fracture T3 with vertebral body height loss centrally of approximately 80%. There is marrow edema within each of the vertebral bodies consistent with acute or early subacute injuries. Marrow edema is also seen in the right fifth and sixth ribs due to the fractures seen on the prior CT.   Cord: Normal signal throughout the visualized cord.   Posterior Fossa, vertebral arteries, paraspinal tissues: Negative.   IMPRESSION: 1. Acute or early subacute compression fractures of C7, T1 and T3 as described above. No retropulsion off the superior endplates of C7 or T1. The central spinal canal and neural foramina are open at all levels. 2. Marrow edema in the right fifth and sixth ribs due to the fractures seen on the prior CT.  PATIENT SURVEYS:  FOTO 48/57  COGNITION: Overall cognitive status: Within functional limits for tasks assessed  SENSATION: WFL  POSTURE: rounded shoulders and forward head  PALPATION: No TTP noted    CERVICAL ROM:   Active ROM A/PROM (deg) eval  Flexion 45 deg  Extension 10 deg  Right lateral flexion 40 deg  Left lateral flexion 35 deg*  Right rotation 40 deg  Left rotation 25 deg*   (Blank rows = not tested)  All cervical PROM= AROM   UPPER EXTREMITY ROM:  Active ROM Right eval Left eval  Shoulder flexion    Shoulder extension    Shoulder abduction 100 deg   Shoulder adduction    Shoulder extension    Shoulder internal rotation C7 C7  Shoulder external rotation Mid- thoracic Mid- thoracic  Elbow flexion    Elbow extension    Wrist flexion    Wrist extension    Wrist ulnar deviation    Wrist radial deviation    Wrist pronation    Wrist supination     (Blank rows = not tested)  UPPER EXTREMITY MMT:  MMT Right eval Left eval  Shoulder flexion 4 4-  Shoulder extension  Shoulder abduction 4+ 4+  Shoulder  adduction    Shoulder extension    Shoulder internal rotation 4 4  Shoulder external rotation 4 4  Middle trapezius    Lower trapezius    Elbow flexion 4+ 4+  Elbow extension 4+ 4+  Wrist flexion    Wrist extension    Wrist ulnar deviation    Wrist radial deviation    Wrist pronation    Wrist supination    Grip strength 4 4   (Blank rows = not tested)  CERVICAL SPECIAL TESTS:  Deferred 2/2 to limited cervical mobility  FUNCTIONAL TESTS:  DNF Endurance Test: Deferred to next session   SLS assessment RLE- 16 seconds LLE- 27 seconds w/ bilat UE support 07/08/23  TODAY'S TREATMENT: DATE: 10/05/2024     Subjective: Patient reports that she has started using her 2# DB at home for HEP. She has also started performing stretches that was taught by a myofascial massage therapist for improved cervical ROM.  No further and questions.   Therapeutic Exercise:  Shoulder Abduction - Elbow Bent at 90   3 x 10 - 2# DB   2 x 10 - 3# DB   Seated Shoulder Row   1 x 10 - 5#   1 x 10 - 10#   1 x 10 - 15#   Seated Shoulder Raise  3 x 10 - 2# DB  Seated OH press   3 x 10 - 2# DB  Standing Foam Roller OH reach on Wall - 2# Ankle Weights donned on wrists    3 x 10    Manual Therapy (10 min unbilled):  Light STM applied to R paracervical, upper trapezius musculature for pain modulation and decrease muscle tension. Myofascial release, trigger point and contract and relax techniques utilized. Pt endorsed improvements in pain following intervention.   Supine Cervical Stretches:   R/L Cervical Rotation - 3 min     L Sidebending - 3 min      PATIENT EDUCATION:  Education details: Exercise Technique   Person educated: Patient Education method: Medical Illustrator Education comprehension: verbalized understanding and returned demonstration  HOME EXERCISE PROGRAM: Access Code: [email or web address] URL: [email or web address] Date: 05/17/2024 Prepared by: Lonni Pall  Exercises - Supine Chin Tuck  - 1 x daily - 4-5 x weekly - 2 sets - 10 reps - Cervical Extension AROM with Strap  - 1 x daily - 4-5 x weekly - 2 sets - 10 reps - Seated Levator Scapulae Stretch  - 1 x daily - 4-5 x weekly - 1 sets - 2 reps - 30 hold - Wall Quarter Squat  - 1 x daily - 3-4 x weekly - 2-3 sets - 15 hold - Standard Plank  - 1 x daily - 3-4 x weekly - 2-3 sets - 15 hold - Scapular Retraction with Resistance  - 1 x daily - 7 x weekly - 2-3 sets - 10-12 reps - Shoulder External Rotation and Scapular Retraction with Resistance  - 1 x daily - 7 x weekly - 2-3 sets - 10-12 reps - Arm Circles on Swiss Ball  - 1 x daily - 7 x weekly - 2-3 sets - 10 reps - Standing Single Arm Shoulder Abduction with Dumbbell - Palm Down  - 1 x daily - 7 x weekly - 2-3 sets - 8-10 reps  Access Code: [email or web address] URL: [email or web address] Date: 01/19/2024 Prepared by: Lonni Pall  Exercises - Supine Chin Tuck  - 1 x daily -  4-5 x weekly - 2 sets - 10 reps - Cervical Extension AROM with Strap  - 1 x daily - 4-5 x weekly - 2 sets - 10 reps - Seated Levator Scapulae Stretch  - 1 x daily - 4-5 x weekly - 1 sets - 2 reps - 30 hold - Seated scapular retraction   - 1 x daily - 4-5 x weekly - 2 sets - 10 reps - Standing Tandem Balance with Counter Support  - 1 x daily - 3-4 x weekly - 1 sets - 3 reps - 30 hold - Standing Single Leg Stance with Counter Support  - 1 x daily - 3-4 x weekly - 1 sets - 3 reps - 30 hold - Wall Quarter Squat  - 1 x daily - 3-4 x weekly - 2-3 sets - 15 hold - Standard Plank  - 1 x daily - 3-4 x weekly - 2-3 sets - 15 hold - Scapular Retraction with Resistance  - 1 x daily - 7 x weekly - 2-3 sets - 10-12 reps - Shoulder External Rotation and Scapular Retraction with Resistance  - 1 x daily - 7 x weekly - 2-3 sets - 10-12 reps  ASSESSMENT:  CLINICAL IMPRESSION:    Patient arriving for 40th appt in this current PT POC warranting progress note. Per last session,  progress towards goals with cervical ROM/Shoulder MMT were reassessed. Patient still continues to present with severe limitations in cervical rotation, side bending and extension (See goals below). Additionally she still has strength deficits (R > L) in her upper extremities. (See goals below for MMT).  Today session with continued focus on improving patient shoulder weakness with deltoid and scapular strengthening. New goal added for patient to lift 5# DB for carrying. Seated rest breaks provided to the patient due to quick muscle fatigue. Manual techniques utilized in order to preserve current cervical ROM for driving and scanning. Without PT interventions patient's functional status has potentional to regress leading to further injury and poor QoL.  She will continue to benefit from skilled PT in order to maintain current function and decrease regression in QoL.  OBJECTIVE IMPAIRMENTS: decreased mobility, decreased ROM, decreased strength, hypomobility, and impaired flexibility.   ACTIVITY LIMITATIONS: carrying and lifting  PARTICIPATION LIMITATIONS: community activity  PERSONAL FACTORS: Age, Past/current experiences, Time since onset of injury/illness/exacerbation, and 3+ comorbidities: scleroderma, osteoporosis, hx of cancer are also affecting patient's functional outcome.   REHAB POTENTIAL: Fair multi- comorbidities  CLINICAL DECISION MAKING: Evolving/moderate complexity  EVALUATION COMPLEXITY: Moderate   GOALS: Goals reviewed with patient? Yes  SHORT TERM GOALS: Target date: 12/01/23  Pt will be independent with HEP to improve cervical and shoulder strength with functional activities   Baseline: 06/24/23: HEP given to pt; 09/02/23: 25% compliance; 11/03/23: 25% compliance; 05/03/2024: < 50% adherence (limited to just AROM) Goal status: ON GOING   LONG TERM GOALS: Target date: 11/23/2024  Pt will improve FOTO to target score to demonstrate clinically significant improvement in  functional mobility.   Baseline: 06/24/23: 46/58; 09/02/23: 47/58; 11/03/23: 50/58 Goal status: DEFERRED  2.  Pt will improve all R/L shoulder MMT 4+ to improve functional mobility of bilat UE's.  Baseline: 06/24/23; 09/02/23; 11/03/23;  MMT Right eval Left eval Left  09/02/23 Right 09/02/23 Right  11/03/23 Left 11/03/23 Right  04/06/24 Left  04/06/24 R/L 05/03/24 R/L  06/30/2024 R/L 09/28/24  Shoulder flexion 4 4- 4- 4- 4 4+ 4- 4 4/5 4/5 4-/4+  Shoulder extension  Shoulder abduction 4+ 4+ 5 4+ 4+ 5 5 5  4-/4+ 4-/4+ 4-/4+  Shoulder adduction             Shoulder extension             Shoulder internal rotation 4 4 4+ 4+ 4 5 4 5  5/5 4/4   Shoulder external rotation 4 4 4+ 4+ 4 5 4 5  5/5 4/4            Goal status: ONGOING   3.  Pt will improve all cervical AROM to [facility] to improve functional mobility.  Baseline: 06/24/23:  Active ROM A/PROM (deg) eval A/PROM (Deg)  09/02/23 A/PROM (Deg)  11/03/23 A/PROM (Deg)  04/06/24 A/PROM  (Deg) 05/03/2024 A/PROM  (Deg) 06/30/2024 AROM (Deg) 09/28/2024  Flexion 45 deg 45 45 35 35 35 30  Extension 10 deg 10 10 15  < 10 <10 < 10   Right lateral flexion 40 deg 20 40 30 30 38 25  Left lateral flexion 35 deg* 35 40 20 20 30 25   Right rotation 40 deg 35 40 40 35 35 30  Left rotation 25 deg* 25 30 25 30 25 28    Goal status: ONGOING  4. Pt will be able to shoulder press 5# in each UE in order to demonstrate improvements in UE strength for liftings tasks at home .  Baseline: 2#  Goal status: INITIAL   PLAN:  PT FREQUENCY: 1x/week  PT DURATION:  12 weeks (bi weekly schedule)   PLANNED INTERVENTIONS: Therapeutic exercises, Therapeutic activity, Neuromuscular re-education, Balance training, Gait training, Patient/Family education, Self Care, Joint mobilization, Spinal manipulation, Spinal mobilization, Cryotherapy, Moist heat, Manual therapy, and Re-evaluation  PLAN FOR NEXT SESSION: Cervical manual therapy/stretching. Progress  postural strengthening exercises. Overhead head motions/strengthening. Compound movement patterns for cervicothoracic mobility and shoulder strength.  Lonni Pall PT, DPT Physical Therapist- [facility] 10/05/2024, 11:26 AM  "

## 2024-10-12 ENCOUNTER — Ambulatory Visit

## 2024-10-12 DIAGNOSIS — M6281 Muscle weakness (generalized): Secondary | ICD-10-CM

## 2024-10-12 DIAGNOSIS — M542 Cervicalgia: Secondary | ICD-10-CM | POA: Diagnosis not present

## 2024-10-12 NOTE — Therapy (Signed)
 " OUTPATIENT PHYSICAL THERAPY CERVICAL TREATMENT   Patient Name: Whitney Mclean MRN: [id number] DOB:1966/08/04, 59 y.o., female Today's Date: 10/12/2024  END OF SESSION:  PT End of Session - 10/12/24 1442     Visit Number 41    Number of Visits 50    Date for Recertification  11/23/24    Authorization Type VL: 30 visits PT/OT per cal yr    Authorization - Visit Number 3    Authorization - Number of Visits 30    Progress Note Due on Visit 50    PT Start Time 1435    PT Stop Time 1515    PT Time Calculation (min) 40 min    Activity Tolerance Patient tolerated treatment well    Behavior During Therapy WFL for tasks assessed/performed          Past Medical History:  Diagnosis Date   Hypertension    Oral cancer (HCC)    Personal history of chemotherapy    Personal history of radiation therapy    Scleroderma (HCC)    Past Surgical History:  Procedure Laterality Date   ABDOMINAL HYSTERECTOMY     Patient Active Problem List   Diagnosis Date Noted   Other secondary kyphosis, cervicothoracic region 05/19/2023   Compression fracture of T3 vertebra (HCC) 05/19/2023   Compression fracture of C7 vertebra (HCC) 05/19/2023   Osteoporosis 05/19/2023    PCP: Oneil PHEBE Pinal, MD  REFERRING PROVIDER: Oneil PHEBE Pinal, MD   REFERRING DIAG: S12.600A (ICD-10-CM) - Unspecified displaced fracture of seventh cervical vertebra, initial encounter for closed fracture  THERAPY DIAG:  Cervicalgia  Muscle weakness (generalized)  Rationale for Evaluation and Treatment: Rehabilitation  ONSET DATE: July 2024  SUBJECTIVE:                                                                                                                                                                                                         SUBJECTIVE STATEMENT: 05/31/24: Patient without pain in her neck. She has been adherent to HEP and utilizing light dumbbells. No questions or concerns.   PERTINENT HISTORY:   Pt presents to PT w/ chronic limited cervical mobility 2/2 severe osteoporosis, scleroderma, and hx of cervical fibrosis of the neck. Since previous bout of PT patient had a fall in the restroom resulting in three fx vertebrae, fx of six ribs, and a fx of her R shoulder blade. Pt's current fx are being managed non-surgically. Pt reports no pain or sx of N/T. She states she has the most difficulty with maintaining a seated position  with no back support and is only able to maintain this position for 2-3 minutes before requiring back support. Pt also reports an increased fear of falling.   PAIN:  Are you having pain? No Main aggravation cannot sit up without back support, increased fear of falling.  Easing factors: having a supported back 2-3 minutes of sitting unsupported before needing support  PRECAUTIONS: Fall  RED FLAGS: None     WEIGHT BEARING RESTRICTIONS: No  FALLS:  Has patient fallen in last 6 months? Yes. Number of falls 1 and near falls  LIVING ENVIRONMENT: Has following equipment at home: Walker - 2 wheeled *Pt does not use RW often  OCCUPATION: Retired, after scleroderma dx  PLOF: Independent  PATIENT GOALS: To get moving again   NEXT MD VISIT: N/A  OBJECTIVE:  Note: Objective measures were completed at Evaluation unless otherwise noted.  DIAGNOSTIC FINDINGS:  CLINICAL DATA:  The patient suffered cervical and thoracic spine compression fractures in a fall 03/25/2023. Subsequent encounter.   EXAM: MRI CERVICAL SPINE WITHOUT CONTRAST   TECHNIQUE: Multiplanar, multisequence MR imaging of the cervical spine was performed. No intravenous contrast was administered.   COMPARISON:  CT chest 04/16/2023.   FINDINGS: Alignment: No listhesis. Mild kyphosis is centered about the T1 level.   Vertebrae: The patient has a superior endplate compression fracture of C7 with vertebral body height loss of approximately 30%, a biconcave compression fracture of T1 vertebral  body height loss of approximately 50% and a biconcave compression fracture T3 with vertebral body height loss centrally of approximately 80%. There is marrow edema within each of the vertebral bodies consistent with acute or early subacute injuries. Marrow edema is also seen in the right fifth and sixth ribs due to the fractures seen on the prior CT.   Cord: Normal signal throughout the visualized cord.   Posterior Fossa, vertebral arteries, paraspinal tissues: Negative.   IMPRESSION: 1. Acute or early subacute compression fractures of C7, T1 and T3 as described above. No retropulsion off the superior endplates of C7 or T1. The central spinal canal and neural foramina are open at all levels. 2. Marrow edema in the right fifth and sixth ribs due to the fractures seen on the prior CT.  PATIENT SURVEYS:  FOTO 48/57  COGNITION: Overall cognitive status: Within functional limits for tasks assessed  SENSATION: WFL  POSTURE: rounded shoulders and forward head  PALPATION: No TTP noted    CERVICAL ROM:   Active ROM A/PROM (deg) eval  Flexion 45 deg  Extension 10 deg  Right lateral flexion 40 deg  Left lateral flexion 35 deg*  Right rotation 40 deg  Left rotation 25 deg*   (Blank rows = not tested)  All cervical PROM= AROM   UPPER EXTREMITY ROM:  Active ROM Right eval Left eval  Shoulder flexion    Shoulder extension    Shoulder abduction 100 deg   Shoulder adduction    Shoulder extension    Shoulder internal rotation C7 C7  Shoulder external rotation Mid- thoracic Mid- thoracic  Elbow flexion    Elbow extension    Wrist flexion    Wrist extension    Wrist ulnar deviation    Wrist radial deviation    Wrist pronation    Wrist supination     (Blank rows = not tested)  UPPER EXTREMITY MMT:  MMT Right eval Left eval  Shoulder flexion 4 4-  Shoulder extension    Shoulder abduction 4+ 4+  Shoulder adduction  Shoulder extension    Shoulder internal  rotation 4 4  Shoulder external rotation 4 4  Middle trapezius    Lower trapezius    Elbow flexion 4+ 4+  Elbow extension 4+ 4+  Wrist flexion    Wrist extension    Wrist ulnar deviation    Wrist radial deviation    Wrist pronation    Wrist supination    Grip strength 4 4   (Blank rows = not tested)  CERVICAL SPECIAL TESTS:  Deferred 2/2 to limited cervical mobility  FUNCTIONAL TESTS:  DNF Endurance Test: Deferred to next session   SLS assessment RLE- 16 seconds LLE- 27 seconds w/ bilat UE support 07/08/23  TODAY'S TREATMENT: DATE: 10/12/24     Subjective: Patient reports that she continues to use 2# DB at home in order to improve shoulder strength.  No further and questions.   Therapeutic Activity (with the intent to improve OH reaching, lifting) :  UBE - 5 min - 2.5 min Fwd, 2.5 min Retro - Level 10-8 for UE  strength and muscular endurance; PT manually adjusted resistance throughout to patient's tolerance.   Shoulder Flexion against resistance   2 x 10 - 2#   TRX Shoulder Rows   2 x 10 rows   Seated Shoulder Row   2 x 10 - 15#   Landmine Press with PVC and 4# AW   2 x 10 - VC for foot placement   Manual Therapy (5 min unbilled):  Supine Cervical Stretches:   R/L Cervical Rotation - 1 min  ea direction   Cervical Sidebending - 1 min ea direction     Cervical Extension - 1 min   PATIENT EDUCATION:  Education details: Exercise Technique   Person educated: Patient Education method: Medical Illustrator Education comprehension: verbalized understanding and returned demonstration  HOME EXERCISE PROGRAM: Access Code: [email or web address] URL: [email or web address] Date: 05/17/2024 Prepared by: Lonni Pall  Exercises - Supine Chin Tuck  - 1 x daily - 4-5 x weekly - 2 sets - 10 reps - Cervical Extension AROM with Strap  - 1 x daily - 4-5 x weekly - 2 sets - 10 reps - Seated Levator Scapulae Stretch  - 1 x daily - 4-5 x weekly - 1 sets - 2  reps - 30 hold - Wall Quarter Squat  - 1 x daily - 3-4 x weekly - 2-3 sets - 15 hold - Standard Plank  - 1 x daily - 3-4 x weekly - 2-3 sets - 15 hold - Scapular Retraction with Resistance  - 1 x daily - 7 x weekly - 2-3 sets - 10-12 reps - Shoulder External Rotation and Scapular Retraction with Resistance  - 1 x daily - 7 x weekly - 2-3 sets - 10-12 reps - Arm Circles on Swiss Ball  - 1 x daily - 7 x weekly - 2-3 sets - 10 reps - Standing Single Arm Shoulder Abduction with Dumbbell - Palm Down  - 1 x daily - 7 x weekly - 2-3 sets - 8-10 reps  Access Code: [email or web address] URL: [email or web address] Date: 01/19/2024 Prepared by: Lonni Pall  Exercises - Supine Chin Tuck  - 1 x daily - 4-5 x weekly - 2 sets - 10 reps - Cervical Extension AROM with Strap  - 1 x daily - 4-5 x weekly - 2 sets - 10 reps - Seated Levator Scapulae Stretch  - 1 x daily - 4-5 x weekly - 1 sets - 2 reps - 30 hold -  Seated scapular retraction   - 1 x daily - 4-5 x weekly - 2 sets - 10 reps - Standing Tandem Balance with Counter Support  - 1 x daily - 3-4 x weekly - 1 sets - 3 reps - 30 hold - Standing Single Leg Stance with Counter Support  - 1 x daily - 3-4 x weekly - 1 sets - 3 reps - 30 hold - Wall Quarter Squat  - 1 x daily - 3-4 x weekly - 2-3 sets - 15 hold - Standard Plank  - 1 x daily - 3-4 x weekly - 2-3 sets - 15 hold - Scapular Retraction with Resistance  - 1 x daily - 7 x weekly - 2-3 sets - 10-12 reps - Shoulder External Rotation and Scapular Retraction with Resistance  - 1 x daily - 7 x weekly - 2-3 sets - 10-12 reps  ASSESSMENT:  CLINICAL IMPRESSION:    Continuing PT POC addressing cervical, thoracic and R shoulder mobility deficits. Continuing to address cervical AROM deficits with manual interventions.  Pt is making progress in overall tolerance for volume of exercise with greater resistance levels.  PT to continue focusing on improving cervical mobility to maintain current cervical ROM  from chronic limitations to ensure adequate ROM for ADL's such as driving.   Without PT interventions patient's functional status has potentional to regress leading to further injury and poor QoL.  She will continue to benefit from skilled PT in order to maintain current function and decrease regression in QoL.  OBJECTIVE IMPAIRMENTS: decreased mobility, decreased ROM, decreased strength, hypomobility, and impaired flexibility.   ACTIVITY LIMITATIONS: carrying and lifting  PARTICIPATION LIMITATIONS: community activity  PERSONAL FACTORS: Age, Past/current experiences, Time since onset of injury/illness/exacerbation, and 3+ comorbidities: scleroderma, osteoporosis, hx of cancer are also affecting patient's functional outcome.   REHAB POTENTIAL: Fair multi- comorbidities  CLINICAL DECISION MAKING: Evolving/moderate complexity  EVALUATION COMPLEXITY: Moderate   GOALS: Goals reviewed with patient? Yes  SHORT TERM GOALS: Target date: 12/01/23  Pt will be independent with HEP to improve cervical and shoulder strength with functional activities   Baseline: 06/24/23: HEP given to pt; 09/02/23: 25% compliance; 11/03/23: 25% compliance; 05/03/2024: < 50% adherence (limited to just AROM) Goal status: ON GOING   LONG TERM GOALS: Target date: 11/23/2024  Pt will improve FOTO to target score to demonstrate clinically significant improvement in functional mobility.   Baseline: 06/24/23: 46/58; 09/02/23: 47/58; 11/03/23: 50/58 Goal status: DEFERRED  2.  Pt will improve all R/L shoulder MMT 4+ to improve functional mobility of bilat UE's.  Baseline: 06/24/23; 09/02/23; 11/03/23;  MMT Right eval Left eval Left  09/02/23 Right 09/02/23 Right  11/03/23 Left 11/03/23 Right  04/06/24 Left  04/06/24 R/L 05/03/24 R/L  06/30/2024 R/L 09/28/24  Shoulder flexion 4 4- 4- 4- 4 4+ 4- 4 4/5 4/5 4-/4+  Shoulder extension             Shoulder abduction 4+ 4+ 5 4+ 4+ 5 5 5  4-/4+ 4-/4+ 4-/4+  Shoulder  adduction             Shoulder extension             Shoulder internal rotation 4 4 4+ 4+ 4 5 4 5  5/5 4/4   Shoulder external rotation 4 4 4+ 4+ 4 5 4 5  5/5 4/4            Goal status: ONGOING   3.  Pt will improve all cervical AROM to [facility] to  improve functional mobility.  Baseline: 06/24/23:  Active ROM A/PROM (deg) eval A/PROM (Deg)  09/02/23 A/PROM (Deg)  11/03/23 A/PROM (Deg)  04/06/24 A/PROM  (Deg) 05/03/2024 A/PROM  (Deg) 06/30/2024 AROM (Deg) 09/28/2024  Flexion 45 deg 45 45 35 35 35 30  Extension 10 deg 10 10 15  < 10 <10 < 10   Right lateral flexion 40 deg 20 40 30 30 38 25  Left lateral flexion 35 deg* 35 40 20 20 30 25   Right rotation 40 deg 35 40 40 35 35 30  Left rotation 25 deg* 25 30 25 30 25 28    Goal status: ONGOING  4. Pt will be able to shoulder press 5# in each UE in order to demonstrate improvements in UE strength for liftings tasks at home .  Baseline: 2#  Goal status: INITIAL   PLAN:  PT FREQUENCY: 1x/week  PT DURATION:  12 weeks (bi weekly schedule)   PLANNED INTERVENTIONS: Therapeutic exercises, Therapeutic activity, Neuromuscular re-education, Balance training, Gait training, Patient/Family education, Self Care, Joint mobilization, Spinal manipulation, Spinal mobilization, Cryotherapy, Moist heat, Manual therapy, and Re-evaluation  PLAN FOR NEXT SESSION: Cervical manual therapy/stretching. Progress postural strengthening exercises. Overhead head motions/strengthening. Compound movement patterns for cervicothoracic mobility and shoulder strength.  Lonni Pall PT, DPT Physical Therapist- [facility] 10/12/24, 2:45 PM  "

## 2024-10-19 ENCOUNTER — Ambulatory Visit

## 2024-10-19 DIAGNOSIS — M6281 Muscle weakness (generalized): Secondary | ICD-10-CM

## 2024-10-19 DIAGNOSIS — M25611 Stiffness of right shoulder, not elsewhere classified: Secondary | ICD-10-CM

## 2024-10-19 DIAGNOSIS — M542 Cervicalgia: Secondary | ICD-10-CM | POA: Diagnosis not present

## 2024-10-19 NOTE — Therapy (Signed)
 " OUTPATIENT PHYSICAL THERAPY CERVICAL TREATMENT   Patient Name: Whitney Mclean MRN: [id number] DOB:09/14/1966, 59 y.o., female Today's Date: 10/19/2024  END OF SESSION:  PT End of Session - 10/19/24 1118     Visit Number 42    Number of Visits 50    Date for Recertification  11/23/24    Authorization Type VL: 30 visits PT/OT per cal yr    Authorization - Visit Number 42    Authorization - Number of Visits 30    Progress Note Due on Visit 50    PT Start Time 1120    PT Stop Time 1200    PT Time Calculation (min) 40 min    Activity Tolerance Patient tolerated treatment well    Behavior During Therapy WFL for tasks assessed/performed          Past Medical History:  Diagnosis Date   Hypertension    Oral cancer (HCC)    Personal history of chemotherapy    Personal history of radiation therapy    Scleroderma (HCC)    Past Surgical History:  Procedure Laterality Date   ABDOMINAL HYSTERECTOMY     Patient Active Problem List   Diagnosis Date Noted   Other secondary kyphosis, cervicothoracic region 05/19/2023   Compression fracture of T3 vertebra (HCC) 05/19/2023   Compression fracture of C7 vertebra (HCC) 05/19/2023   Osteoporosis 05/19/2023    PCP: Oneil PHEBE Pinal, MD  REFERRING PROVIDER: Oneil PHEBE Pinal, MD   REFERRING DIAG: S12.600A (ICD-10-CM) - Unspecified displaced fracture of seventh cervical vertebra, initial encounter for closed fracture  THERAPY DIAG:  Cervicalgia  Muscle weakness (generalized)  Stiffness of right shoulder, not elsewhere classified  Rationale for Evaluation and Treatment: Rehabilitation  ONSET DATE: July 2024  SUBJECTIVE:                                                                                                                                                                                                         SUBJECTIVE STATEMENT: 05/31/24: Patient without pain in her neck. She has been adherent to HEP and utilizing light  dumbbells. No questions or concerns.   PERTINENT HISTORY:  Pt presents to PT w/ chronic limited cervical mobility 2/2 severe osteoporosis, scleroderma, and hx of cervical fibrosis of the neck. Since previous bout of PT patient had a fall in the restroom resulting in three fx vertebrae, fx of six ribs, and a fx of her R shoulder blade. Pt's current fx are being managed non-surgically. Pt reports no pain or sx of N/T. She states she has  the most difficulty with maintaining a seated position with no back support and is only able to maintain this position for 2-3 minutes before requiring back support. Pt also reports an increased fear of falling.   PAIN:  Are you having pain? No Main aggravation cannot sit up without back support, increased fear of falling.  Easing factors: having a supported back 2-3 minutes of sitting unsupported before needing support  PRECAUTIONS: Fall  RED FLAGS: None     WEIGHT BEARING RESTRICTIONS: No  FALLS:  Has patient fallen in last 6 months? Yes. Number of falls 1 and near falls  LIVING ENVIRONMENT: Has following equipment at home: Walker - 2 wheeled *Pt does not use RW often  OCCUPATION: Retired, after scleroderma dx  PLOF: Independent  PATIENT GOALS: To get moving again   NEXT MD VISIT: N/A  OBJECTIVE:  Note: Objective measures were completed at Evaluation unless otherwise noted.  DIAGNOSTIC FINDINGS:  CLINICAL DATA:  The patient suffered cervical and thoracic spine compression fractures in a fall 03/25/2023. Subsequent encounter.   EXAM: MRI CERVICAL SPINE WITHOUT CONTRAST   TECHNIQUE: Multiplanar, multisequence MR imaging of the cervical spine was performed. No intravenous contrast was administered.   COMPARISON:  CT chest 04/16/2023.   FINDINGS: Alignment: No listhesis. Mild kyphosis is centered about the T1 level.   Vertebrae: The patient has a superior endplate compression fracture of C7 with vertebral body height loss of  approximately 30%, a biconcave compression fracture of T1 vertebral body height loss of approximately 50% and a biconcave compression fracture T3 with vertebral body height loss centrally of approximately 80%. There is marrow edema within each of the vertebral bodies consistent with acute or early subacute injuries. Marrow edema is also seen in the right fifth and sixth ribs due to the fractures seen on the prior CT.   Cord: Normal signal throughout the visualized cord.   Posterior Fossa, vertebral arteries, paraspinal tissues: Negative.   IMPRESSION: 1. Acute or early subacute compression fractures of C7, T1 and T3 as described above. No retropulsion off the superior endplates of C7 or T1. The central spinal canal and neural foramina are open at all levels. 2. Marrow edema in the right fifth and sixth ribs due to the fractures seen on the prior CT.  PATIENT SURVEYS:  FOTO 48/57  COGNITION: Overall cognitive status: Within functional limits for tasks assessed  SENSATION: WFL  POSTURE: rounded shoulders and forward head  PALPATION: No TTP noted    CERVICAL ROM:   Active ROM A/PROM (deg) eval  Flexion 45 deg  Extension 10 deg  Right lateral flexion 40 deg  Left lateral flexion 35 deg*  Right rotation 40 deg  Left rotation 25 deg*   (Blank rows = not tested)  All cervical PROM= AROM   UPPER EXTREMITY ROM:  Active ROM Right eval Left eval  Shoulder flexion    Shoulder extension    Shoulder abduction 100 deg   Shoulder adduction    Shoulder extension    Shoulder internal rotation C7 C7  Shoulder external rotation Mid- thoracic Mid- thoracic  Elbow flexion    Elbow extension    Wrist flexion    Wrist extension    Wrist ulnar deviation    Wrist radial deviation    Wrist pronation    Wrist supination     (Blank rows = not tested)  UPPER EXTREMITY MMT:  MMT Right eval Left eval  Shoulder flexion 4 4-  Shoulder extension    Shoulder  abduction 4+ 4+   Shoulder adduction    Shoulder extension    Shoulder internal rotation 4 4  Shoulder external rotation 4 4  Middle trapezius    Lower trapezius    Elbow flexion 4+ 4+  Elbow extension 4+ 4+  Wrist flexion    Wrist extension    Wrist ulnar deviation    Wrist radial deviation    Wrist pronation    Wrist supination    Grip strength 4 4   (Blank rows = not tested)  CERVICAL SPECIAL TESTS:  Deferred 2/2 to limited cervical mobility  FUNCTIONAL TESTS:  DNF Endurance Test: Deferred to next session   SLS assessment RLE- 16 seconds LLE- 27 seconds w/ bilat UE support 07/08/23  TODAY'S TREATMENT: DATE: 10/19/24     Subjective: Patient reports that everything is relatively the same. No pain prior to the end of the session. No further and questions.   Therapeutic Activity (with the intent to improve OH reaching, lifting) :  UBE - 5 min - 2.5 min Fwd, 2.5 min Retro - Level 8-6 for UE  strength and muscular endurance; PT manually adjusted resistance throughout to patient's tolerance.   Squat + OH Press   3 x 10 - 2# DB in ea UE  (seated rest break in b/t sets)   Bent Over Row   3 x 10 - 4# DB ea UE   Front Raise for deltoid strengthening for lifting   1 x 8 (weighted dowel)   2 x 10 - PVC and 4# AW in the middle   Manual Therapy:  Light STM  applied to R cervical paraspinal musculature for pain modulation and decrease muscle tension. Myofascial release techniques utilized. Pt endorsed improvements in pain following intervention.   Supine Cervical Stretches:   R/L Cervical Rotation - 2 min  ea direction   Cervical Sidebending - 2 min ea direction    PATIENT EDUCATION:  Education details: Exercise Technique   Person educated: Patient Education method: Medical Illustrator Education comprehension: verbalized understanding and returned demonstration  HOME EXERCISE PROGRAM: Access Code: [email or web address] URL: [email or web address] Date: 05/17/2024 Prepared  by: Lonni Pall  Exercises - Supine Chin Tuck  - 1 x daily - 4-5 x weekly - 2 sets - 10 reps - Cervical Extension AROM with Strap  - 1 x daily - 4-5 x weekly - 2 sets - 10 reps - Seated Levator Scapulae Stretch  - 1 x daily - 4-5 x weekly - 1 sets - 2 reps - 30 hold - Wall Quarter Squat  - 1 x daily - 3-4 x weekly - 2-3 sets - 15 hold - Standard Plank  - 1 x daily - 3-4 x weekly - 2-3 sets - 15 hold - Scapular Retraction with Resistance  - 1 x daily - 7 x weekly - 2-3 sets - 10-12 reps - Shoulder External Rotation and Scapular Retraction with Resistance  - 1 x daily - 7 x weekly - 2-3 sets - 10-12 reps - Arm Circles on Swiss Ball  - 1 x daily - 7 x weekly - 2-3 sets - 10 reps - Standing Single Arm Shoulder Abduction with Dumbbell - Palm Down  - 1 x daily - 7 x weekly - 2-3 sets - 8-10 reps  Access Code: [email or web address] URL: [email or web address] Date: 01/19/2024 Prepared by: Lonni Pall  Exercises - Supine Chin Tuck  - 1 x daily - 4-5 x weekly - 2 sets - 10 reps - Cervical Extension AROM with  Strap  - 1 x daily - 4-5 x weekly - 2 sets - 10 reps - Seated Levator Scapulae Stretch  - 1 x daily - 4-5 x weekly - 1 sets - 2 reps - 30 hold - Seated scapular retraction   - 1 x daily - 4-5 x weekly - 2 sets - 10 reps - Standing Tandem Balance with Counter Support  - 1 x daily - 3-4 x weekly - 1 sets - 3 reps - 30 hold - Standing Single Leg Stance with Counter Support  - 1 x daily - 3-4 x weekly - 1 sets - 3 reps - 30 hold - Wall Quarter Squat  - 1 x daily - 3-4 x weekly - 2-3 sets - 15 hold - Standard Plank  - 1 x daily - 3-4 x weekly - 2-3 sets - 15 hold - Scapular Retraction with Resistance  - 1 x daily - 7 x weekly - 2-3 sets - 10-12 reps - Shoulder External Rotation and Scapular Retraction with Resistance  - 1 x daily - 7 x weekly - 2-3 sets - 10-12 reps  ASSESSMENT:  CLINICAL IMPRESSION:    Continuing PT POC addressing cervical, thoracic and R shoulder mobility deficits.  Continued manual therapy to address cervical AROM deficits. Shoulder strength addressed with functional lifting techniques. Good tolerance to weighted dowel however she presented with quick muscle fatigue. PT challenged patient with increased resistance for scapular musculature. She still presenting with severe deficits in UE strength, endurance and cervical range of motion. Without PT interventions patient's functional status has potentional to regress leading to further injury and poor QoL.  She will continue to benefit from skilled PT in order to maintain current function and decrease regression in QoL.   OBJECTIVE IMPAIRMENTS: decreased mobility, decreased ROM, decreased strength, hypomobility, and impaired flexibility.   ACTIVITY LIMITATIONS: carrying and lifting  PARTICIPATION LIMITATIONS: community activity  PERSONAL FACTORS: Age, Past/current experiences, Time since onset of injury/illness/exacerbation, and 3+ comorbidities: scleroderma, osteoporosis, hx of cancer are also affecting patient's functional outcome.   REHAB POTENTIAL: Fair multi- comorbidities  CLINICAL DECISION MAKING: Evolving/moderate complexity  EVALUATION COMPLEXITY: Moderate   GOALS: Goals reviewed with patient? Yes  SHORT TERM GOALS: Target date: 12/01/23  Pt will be independent with HEP to improve cervical and shoulder strength with functional activities   Baseline: 06/24/23: HEP given to pt; 09/02/23: 25% compliance; 11/03/23: 25% compliance; 05/03/2024: < 50% adherence (limited to just AROM) Goal status: ON GOING   LONG TERM GOALS: Target date: 11/23/2024  Pt will improve FOTO to target score to demonstrate clinically significant improvement in functional mobility.   Baseline: 06/24/23: 46/58; 09/02/23: 47/58; 11/03/23: 50/58 Goal status: DEFERRED  2.  Pt will improve all R/L shoulder MMT 4+ to improve functional mobility of bilat UE's.  Baseline: 06/24/23; 09/02/23; 11/03/23;  MMT Right eval Left eval  Left  09/02/23 Right 09/02/23 Right  11/03/23 Left 11/03/23 Right  04/06/24 Left  04/06/24 R/L 05/03/24 R/L  06/30/2024 R/L 09/28/24  Shoulder flexion 4 4- 4- 4- 4 4+ 4- 4 4/5 4/5 4-/4+  Shoulder extension             Shoulder abduction 4+ 4+ 5 4+ 4+ 5 5 5  4-/4+ 4-/4+ 4-/4+  Shoulder adduction             Shoulder extension             Shoulder internal rotation 4 4 4+ 4+ 4 5 4 5  5/5 4/4   Shoulder  external rotation 4 4 4+ 4+ 4 5 4 5  5/5 4/4            Goal status: ONGOING   3.  Pt will improve all cervical AROM to [facility] to improve functional mobility.  Baseline: 06/24/23:  Active ROM A/PROM (deg) eval A/PROM (Deg)  09/02/23 A/PROM (Deg)  11/03/23 A/PROM (Deg)  04/06/24 A/PROM  (Deg) 05/03/2024 A/PROM  (Deg) 06/30/2024 AROM (Deg) 09/28/2024  Flexion 45 deg 45 45 35 35 35 30  Extension 10 deg 10 10 15  < 10 <10 < 10   Right lateral flexion 40 deg 20 40 30 30 38 25  Left lateral flexion 35 deg* 35 40 20 20 30 25   Right rotation 40 deg 35 40 40 35 35 30  Left rotation 25 deg* 25 30 25 30 25 28    Goal status: ONGOING  4. Pt will be able to shoulder press 5# in each UE in order to demonstrate improvements in UE strength for liftings tasks at home .  Baseline: 2#  Goal status: INITIAL   PLAN:  PT FREQUENCY: 1x/week  PT DURATION:  12 weeks (bi weekly schedule)   PLANNED INTERVENTIONS: Therapeutic exercises, Therapeutic activity, Neuromuscular re-education, Balance training, Gait training, Patient/Family education, Self Care, Joint mobilization, Spinal manipulation, Spinal mobilization, Cryotherapy, Moist heat, Manual therapy, and Re-evaluation  PLAN FOR NEXT SESSION: Cervical manual therapy/stretching. Progress postural strengthening exercises. Overhead head motions/strengthening. Compound movement patterns for cervicothoracic mobility and shoulder strength.  Lonni Pall PT, DPT Physical Therapist- [facility] 10/19/24, 12:36 PM  "

## 2024-10-26 ENCOUNTER — Ambulatory Visit

## 2024-10-26 DIAGNOSIS — M542 Cervicalgia: Secondary | ICD-10-CM

## 2024-10-26 DIAGNOSIS — M6281 Muscle weakness (generalized): Secondary | ICD-10-CM

## 2024-10-26 DIAGNOSIS — M25611 Stiffness of right shoulder, not elsewhere classified: Secondary | ICD-10-CM

## 2024-11-02 ENCOUNTER — Ambulatory Visit

## 2024-11-09 ENCOUNTER — Ambulatory Visit

## 2024-11-16 ENCOUNTER — Ambulatory Visit

## 2024-11-30 ENCOUNTER — Ambulatory Visit

## 2024-12-07 ENCOUNTER — Ambulatory Visit

## 2024-12-14 ENCOUNTER — Ambulatory Visit

## 2024-12-21 ENCOUNTER — Ambulatory Visit
